# Patient Record
Sex: Female | Born: 1960
Health system: Southern US, Community
[De-identification: ages and names within clinical notes are randomized; demographics above are authoritative.]

## PROBLEM LIST (undated history)

## (undated) DIAGNOSIS — R600 Localized edema: Secondary | ICD-10-CM

## (undated) DIAGNOSIS — N185 Chronic kidney disease, stage 5: Secondary | ICD-10-CM

## (undated) DIAGNOSIS — R0602 Shortness of breath: Secondary | ICD-10-CM

## (undated) DIAGNOSIS — D649 Anemia, unspecified: Secondary | ICD-10-CM

## (undated) DIAGNOSIS — I5032 Chronic diastolic (congestive) heart failure: Secondary | ICD-10-CM

## (undated) DIAGNOSIS — E119 Type 2 diabetes mellitus without complications: Secondary | ICD-10-CM

## (undated) DIAGNOSIS — K5900 Constipation, unspecified: Secondary | ICD-10-CM

## (undated) DIAGNOSIS — J189 Pneumonia, unspecified organism: Secondary | ICD-10-CM

## (undated) DIAGNOSIS — E663 Overweight: Secondary | ICD-10-CM

## (undated) DIAGNOSIS — R0600 Dyspnea, unspecified: Secondary | ICD-10-CM

## (undated) DIAGNOSIS — I10 Essential (primary) hypertension: Secondary | ICD-10-CM

## (undated) HISTORY — DX: Overweight: E66.3

## (undated) HISTORY — PX: TUBAL LIGATION: SHX77

## (undated) HISTORY — DX: Shortness of breath: R06.02

## (undated) HISTORY — PX: TOOTH EXTRACTION: SUR596

## (undated) HISTORY — DX: Type 2 diabetes mellitus without complications: E11.9

## (undated) HISTORY — PX: FISTULA PLUG: SHX5831

## (undated) HISTORY — DX: Essential (primary) hypertension: I10

## (undated) HISTORY — DX: Localized edema: R60.0

## (undated) HISTORY — PX: OTHER SURGICAL HISTORY: SHX169

## (undated) HISTORY — PX: TONSILECTOMY, ADENOIDECTOMY, BILATERAL MYRINGOTOMY AND TUBES: SHX2538

## (undated) HISTORY — PX: FRACTURE SURGERY: SHX138

## (undated) HISTORY — PX: COLONOSCOPY: SHX5424

## (undated) HISTORY — DX: Constipation, unspecified: K59.00

---

## 1998-07-08 ENCOUNTER — Emergency Department (HOSPITAL_COMMUNITY): Admission: EM | Admit: 1998-07-08 | Discharge: 1998-07-08 | Payer: Self-pay | Admitting: Internal Medicine

## 1999-01-08 ENCOUNTER — Emergency Department (HOSPITAL_COMMUNITY): Admission: EM | Admit: 1999-01-08 | Discharge: 1999-01-08 | Payer: Self-pay | Admitting: Emergency Medicine

## 1999-01-08 ENCOUNTER — Encounter: Payer: Self-pay | Admitting: Emergency Medicine

## 1999-01-09 ENCOUNTER — Emergency Department (HOSPITAL_COMMUNITY): Admission: EM | Admit: 1999-01-09 | Discharge: 1999-01-09 | Payer: Self-pay | Admitting: Emergency Medicine

## 1999-01-17 ENCOUNTER — Encounter: Admission: RE | Admit: 1999-01-17 | Discharge: 1999-01-29 | Payer: Self-pay | Admitting: *Deleted

## 2000-02-25 ENCOUNTER — Ambulatory Visit (HOSPITAL_BASED_OUTPATIENT_CLINIC_OR_DEPARTMENT_OTHER): Admission: RE | Admit: 2000-02-25 | Discharge: 2000-02-25 | Payer: Self-pay | Admitting: Orthopedic Surgery

## 2000-12-18 ENCOUNTER — Ambulatory Visit (HOSPITAL_COMMUNITY): Admission: RE | Admit: 2000-12-18 | Discharge: 2000-12-18 | Payer: Self-pay | Admitting: Neurosurgery

## 2000-12-18 ENCOUNTER — Encounter: Payer: Self-pay | Admitting: Neurosurgery

## 2005-01-05 ENCOUNTER — Emergency Department (HOSPITAL_COMMUNITY): Admission: EM | Admit: 2005-01-05 | Discharge: 2005-01-05 | Payer: Self-pay | Admitting: Emergency Medicine

## 2010-05-16 ENCOUNTER — Observation Stay (HOSPITAL_COMMUNITY): Admission: EM | Admit: 2010-05-16 | Discharge: 2010-05-16 | Payer: Self-pay | Admitting: Emergency Medicine

## 2010-05-16 ENCOUNTER — Ambulatory Visit: Payer: Self-pay | Admitting: Cardiovascular Disease

## 2011-03-09 LAB — POCT CARDIAC MARKERS
CKMB, poc: 1.4 ng/mL (ref 1.0–8.0)
CKMB, poc: <1 ng/mL — ABNORMAL LOW (ref 1.0–8.0)
Myoglobin, poc: 56.5 ng/mL (ref 12–200)
Myoglobin, poc: 84.1 ng/mL (ref 12–200)
Troponin i, poc: <0.05 ng/mL (ref 0.00–0.09)
Troponin i, poc: <0.05 ng/mL (ref 0.00–0.09)

## 2011-03-09 LAB — POCT I-STAT, CHEM 8
BUN: 8 mg/dL (ref 6–23)
Calcium, Ion: 1.1 mmol/L — ABNORMAL LOW (ref 1.12–1.32)
Chloride: 106 mEq/L (ref 96–112)
Creatinine, Ser: 0.7 mg/dL (ref 0.4–1.2)
Glucose, Bld: 207 mg/dL — ABNORMAL HIGH (ref 70–99)
HCT: 43 % (ref 36.0–46.0)
Hemoglobin: 14.6 g/dL (ref 12.0–15.0)
Potassium: 3.4 mEq/L — ABNORMAL LOW (ref 3.5–5.1)
Sodium: 139 mEq/L (ref 135–145)
TCO2: 23 mmol/L (ref 0–100)

## 2011-03-09 LAB — CBC
HCT: 41.2 % (ref 36.0–46.0)
Hemoglobin: 13.9 g/dL (ref 12.0–15.0)
MCHC: 33.8 g/dL (ref 30.0–36.0)
MCV: 85.7 fL (ref 78.0–100.0)
Platelets: 260 10*3/uL (ref 150–400)
RBC: 4.81 MIL/uL (ref 3.87–5.11)
RDW: 14 % (ref 11.5–15.5)
WBC: 9.4 10*3/uL (ref 4.0–10.5)

## 2011-03-09 LAB — DIFFERENTIAL
Basophils Absolute: 0 10*3/uL (ref 0.0–0.1)
Basophils Relative: 0 % (ref 0–1)
Eosinophils Absolute: 0.2 10*3/uL (ref 0.0–0.7)
Eosinophils Relative: 2 % (ref 0–5)
Lymphocytes Relative: 36 % (ref 12–46)
Lymphs Abs: 3.4 10*3/uL (ref 0.7–4.0)
Monocytes Absolute: 0.7 10*3/uL (ref 0.1–1.0)
Monocytes Relative: 8 % (ref 3–12)
Neutro Abs: 5.1 10*3/uL (ref 1.7–7.7)
Neutrophils Relative %: 55 % (ref 43–77)

## 2011-05-08 NOTE — Op Note (Signed)
[place]  Patient:    Pamela Campbell, Pamela Campbell                       MRN: [id number] Proc. Date: 02/25/00 Adm. Date:  [id number] Attending:  Lowella Petties                           Operative Report  PREOPERATIVE DIAGNOSIS:  Impingement with type 3 acromion and acromioclavicular  joint arthritis.  POSTOPERATIVE DIAGNOSES: 1. Severe glenoid articular cartilage injury with full thickness flap tears of he    glenoid surface. 2. Acromioclavicular joint arthritis. 3. Impingement and partial thickness rotator cuff tear.  OPERATION: 1. Debridement of glenoid centrally, superiorly and inferiorly with several    areas of exposed bone. 2. Anterolateral acromioplasty with debridement of superior bursa side rotator    cuff tear. 3. Resection of distal clavicle.  SURGEON:  Alta Corning, M.D.  ASSISTANT:  Sharlee Blew, P.A.  ANESTHESIA:  General  BRIEF HISTORY:  This is a 50 year old female with a long history of having had eft shoulder pain after an injury. She had been treated conservatively for almost a  year and had persistent continued pain with motion of the shoulder, especially ith overhead motion.  Because of this, she had MRI.  MRI showed that she had some strange cystic changes on the anterior glenoid rim as well as obvious narrowing of the outlet space and injury in the subacromial space to the rotator cuff.  Also  noted was some impingement via the distal clavicle.  Because of failure of conservative care, she was brought to the operating room for evaluation under anesthesia, arthroscopy and fixation of these abnormalities.  DESCRIPTION OF PROCEDURE:  The patient was brought to the operating room and had adequate anesthesia obtained.  The patient was placed supine on the operating table. She was then moved into the Grant positioner and moved into [employer] chair position.  All bony prominences were well padded.  Care being taken  to make sure there was no pressure around the head and neck area.  Following this, the shoulder was prepped and draped in the usual sterile fashion and following this examination of the glenohumeral joint revealed that there was obvious injury to the articular cartilage and the glenoid.  This was full thickness in a couple of areas.  A probe was put in and large articular flaps were visible, mostly anteriorly, also superiorly and inferiorly on the anterior surface.  This was in combination with a significant amount of anterior labra fray and this was debrided.  This was a significant portion of the operation and took about 25 minutes to debride the anterior labrum and articular cartilage.  The rotator cuff was then evaluated from the undersurface and noted to be normal.  Following this, attention was turned nto the subacromial space where there was significant amount of bursal side injury o the rotator cuff which was debrided.  The Bovie was then used to expose the large anterior subacromial spur. Once this was identified, the coracoacromial ligament was taken down and an anterolateral acromioplasty was performed with the bur. t this point, the distal clavicle was identified and noted to be compressing the rotator cuff on the medial side and distal clavicle resection was undertaken with a large motorized bur.  Following this, the sucker shaver was used to remove all ony fragments on the superior side of the  rotator cuff and debridement was undertaken of this.  The rotator cuff was divided thoroughly from the upper surface and no  full thickness tears were identified.  At this point, the arm was put through a  range of motion and there was no tendency towards impingement anterior laterally or at the medial side of the acromion.  At this point, the shoulder was copiously irrigated and suctioned dry.  The arthroscopic portals were closed with Steri-Strips.  The patient was put  into an arm sling and sterile compression dressing was applied.  She was taken to the recovery room.  She was noted to be in satisfactory condition. Estimated blood loss for the procedure was none. DD:  02/25/00 TD:  02/26/00 Job: [id number]

## 2013-01-17 ENCOUNTER — Emergency Department (HOSPITAL_COMMUNITY)
Admission: EM | Admit: 2013-01-17 | Discharge: 2013-01-17 | Disposition: A | Discharge status: Home / Self Care | Payer: Self-pay | Attending: Emergency Medicine | Admitting: Emergency Medicine

## 2013-01-17 ENCOUNTER — Encounter (HOSPITAL_COMMUNITY): Payer: Self-pay | Admitting: *Deleted

## 2013-01-17 DIAGNOSIS — R509 Fever, unspecified: Secondary | ICD-10-CM | POA: Insufficient documentation

## 2013-01-17 DIAGNOSIS — M545 Low back pain, unspecified: Secondary | ICD-10-CM | POA: Insufficient documentation

## 2013-01-17 DIAGNOSIS — K5900 Constipation, unspecified: Secondary | ICD-10-CM | POA: Insufficient documentation

## 2013-01-17 DIAGNOSIS — R6889 Other general symptoms and signs: Secondary | ICD-10-CM

## 2013-01-17 DIAGNOSIS — R42 Dizziness and giddiness: Secondary | ICD-10-CM | POA: Insufficient documentation

## 2013-01-17 DIAGNOSIS — Z3202 Encounter for pregnancy test, result negative: Secondary | ICD-10-CM | POA: Insufficient documentation

## 2013-01-17 LAB — BASIC METABOLIC PANEL
BUN: 10 mg/dL (ref 6–23)
CO2: 27 mEq/L (ref 19–32)
Chloride: 93 mEq/L — ABNORMAL LOW (ref 96–112)
GFR calc Af Amer: >90 mL/min (ref >90)
Glucose, Bld: 286 mg/dL — ABNORMAL HIGH (ref 70–99)
Potassium: 3.7 mEq/L (ref 3.5–5.1)

## 2013-01-17 LAB — URINALYSIS, ROUTINE W REFLEX MICROSCOPIC
Bilirubin Urine: NEGATIVE
Nitrite: NEGATIVE
Protein, ur: >300 mg/dL — AB
Urobilinogen, UA: 0.2 mg/dL (ref 0.0–1.0)

## 2013-01-17 LAB — CBC WITH DIFFERENTIAL/PLATELET
Basophils Relative: 0 % (ref 0–1)
Hemoglobin: 13.2 g/dL (ref 12.0–15.0)
Lymphocytes Relative: 29 % (ref 12–46)
Lymphs Abs: 2.1 10*3/uL (ref 0.7–4.0)
Monocytes Relative: 9 % (ref 3–12)
Neutro Abs: 4.5 10*3/uL (ref 1.7–7.7)
Neutrophils Relative %: 62 % (ref 43–77)
RBC: 4.6 MIL/uL (ref 3.87–5.11)

## 2013-01-17 LAB — URINE MICROSCOPIC-ADD ON

## 2013-01-17 MED ORDER — KETOROLAC TROMETHAMINE 30 MG/ML IJ SOLN
30.0000 mg | Freq: Once | INTRAMUSCULAR | Status: AC
  Administered 2013-01-17: 30 mg via INTRAVENOUS
  Filled 2013-01-17: qty 1

## 2013-01-17 MED ORDER — OSELTAMIVIR PHOSPHATE 75 MG PO CAPS: 75.0000 mg | ORAL_CAPSULE | Freq: Two times a day (BID) | ORAL | Status: DC

## 2013-01-17 MED ORDER — SODIUM CHLORIDE 0.9 % IV BOLUS (SEPSIS)
1000.0000 mL | Freq: Once | INTRAVENOUS | Status: AC
  Administered 2013-01-17: 1000 mL via INTRAVENOUS

## 2013-01-17 MED ORDER — POLYETHYLENE GLYCOL 3350 17 G PO PACK: 17.0000 g | PACK | Freq: Every day | ORAL | Status: DC

## 2013-01-17 MED ORDER — ONDANSETRON HCL 4 MG/2ML IJ SOLN: 4.0000 mg | Freq: Once | INTRAMUSCULAR | Status: DC

## 2013-01-17 MED ORDER — ACETAMINOPHEN 325 MG PO TABS
650.0000 mg | ORAL_TABLET | Freq: Once | ORAL | Status: AC
  Administered 2013-01-17: 650 mg via ORAL
  Filled 2013-01-17: qty 2

## 2013-01-17 MED ORDER — MORPHINE SULFATE 4 MG/ML IJ SOLN: 4.0000 mg | Freq: Once | INTRAMUSCULAR | Status: DC

## 2013-01-17 NOTE — ED Provider Notes (Signed)
History     CSN: [id number]  Arrival date & time 01/17/13  [id number]   First MD Initiated Contact with Patient 01/17/13 2038      Chief Complaint  Patient presents with  . Abdominal Pain    (Consider location/radiation/quality/duration/timing/severity/associated sxs/prior treatment) HPI Comments: Pamela Campbell is a 52 y.o. female w no sig PMHx that present to the ER c/o constipation. Pt reports she did not have a BM for 7 days and she began developing mild sharp diffuse abdominal pains, However she was able to have a normal BM today and has had no more pain since. In addition pts states that she feels like she is "coming down with something." She describes feeling more fatigued then usual, having a tactile fever, a light itch in her throat and periods of light headedness. Pt denies any N/V/D, syncope, CP, SOB, palpitations, sweating, nausea, neck pain, change in appetite ataxia, dysequilibrium, change in vision or  Head trauma. Denies melena hematochezia.   Patient is a 52 y.o. female presenting with abdominal pain. The history is provided by the patient.  Abdominal Pain The primary symptoms of the illness include abdominal pain. The primary symptoms of the illness do not include fever, nausea, vomiting, diarrhea or dysuria.  Additional symptoms associated with the illness include constipation. Symptoms associated with the illness do not include diaphoresis.    History reviewed. No pertinent past medical history.  History reviewed. No pertinent past surgical history.  No family history on file.  History  Substance Use Topics  . Smoking status: Never Smoker   . Smokeless tobacco: Not on file  . Alcohol Use: No    OB History    Grav Para Term Preterm Abortions TAB SAB Ect Mult Living                  Review of Systems  Constitutional: Negative for fever, diaphoresis and activity change.  HENT: Negative for congestion and neck pain.   Respiratory: Negative for cough.     Gastrointestinal: Positive for abdominal pain and constipation. Negative for nausea, vomiting, diarrhea and blood in stool.  Genitourinary: Negative for dysuria.  Musculoskeletal: Negative for myalgias.  Skin: Negative for color change and wound.  Neurological: Positive for dizziness. Negative for headaches.  All other systems reviewed and are negative.    Allergies  Compazine and Vioxx  Home Medications   Current Outpatient Rx  Name  Route  Sig  Dispense  Refill  . DEXTROMETHORPHAN HBR 15 MG/5ML PO SYRP   Oral   Take 10 mLs by mouth 4 (four) times daily as needed. For cough           BP 197/97  Pulse 87  Temp 99.7 F (37.6 C) (Oral)  Resp 20  SpO2 95%  Physical Exam  Constitutional: She is oriented to person, place, and time. She appears well-developed and well-nourished. No distress.  HENT:  Head: Normocephalic and atraumatic.       MMM, orophyarnx clear and moist without exudate  Eyes: Conjunctivae normal and EOM are normal. Pupils are equal, round, and reactive to light. No scleral icterus.  Neck: Normal range of motion. Neck supple. No tracheal deviation present. No thyromegaly present.  Cardiovascular: Normal rate, regular rhythm, normal heart sounds and intact distal pulses.   Pulmonary/Chest: No stridor.       LCAB, normal effort  Abdominal: Soft.       Obese soft non tender  Musculoskeletal: Normal range of motion. She exhibits no edema  and no tenderness.  Neurological: She is alert and oriented to person, place, and time. Coordination normal.       CN intact, normal gait, good coordination  Skin: Skin is warm and dry. No rash noted. She is not diaphoretic. No erythema. No pallor.  Psychiatric: She has a normal mood and affect. Her behavior is normal.    ED Course  Procedures (including critical care time)  Labs Reviewed  URINALYSIS, ROUTINE W REFLEX MICROSCOPIC - Abnormal; Notable for the following:    Glucose, UA 100 (*)     Hgb urine dipstick  TRACE (*)     Protein, ur >300 (*)     All other components within normal limits  URINE MICROSCOPIC-ADD ON - Abnormal; Notable for the following:    Squamous Epithelial / LPF FEW (*)     Bacteria, UA FEW (*)     Casts GRANULAR CAST (*)     All other components within normal limits  POCT PREGNANCY, URINE  CBC WITH DIFFERENTIAL  BASIC METABOLIC PANEL   No results found.   1. Constipation   2. Flu-like symptoms       MDM  52 yo F w PMHx presents to ER reporting she had intermittent abdominal pain associated with constipation x 7 day that resolved after a BM that was had this morning. In addition she reports the new onset of flu like symptoms of fatigue, myalgias and low grade fever. BP 197/97  Pulse 87  Temp 99.7 F (37.6 C) (Oral)  Resp 20  SpO2 95% Pt will be dc w tamiflu and mirilax w instructions to hydrate rest and follow up w PCP. Strict return precautions discussed especially if abdominal pain returns with fever. Pt verbalizes understanding and appears reliable.         Verl Dicker, [place] 01/25/13 0020

## 2013-01-17 NOTE — ED Notes (Signed)
Pt discharged.Vital signs stable and GCS 15 

## 2013-01-17 NOTE — ED Notes (Signed)
The pt has had lower back and lower abd pain for 4-5 days.  She has also had dizziness and no bm for approx 7 days.  She has had laxatives with no results and she has periods of feeling like she is going to faint.

## 2013-01-25 NOTE — ED Provider Notes (Signed)
Medical screening examination/treatment/procedure(s) were performed by non-physician practitioner and as supervising physician I was immediately available for consultation/collaboration.   Ezequiel Essex, MD 01/25/13 1053

## 2013-05-08 ENCOUNTER — Ambulatory Visit (INDEPENDENT_AMBULATORY_CARE_PROVIDER_SITE_OTHER): Payer: BC Managed Care – PPO | Admitting: Physician Assistant

## 2013-05-08 VITALS — BP 182/118 | HR 72 | Temp 98.1°F | Resp 16 | Ht 62.0 in | Wt 241.0 lb

## 2013-05-08 DIAGNOSIS — R7309 Other abnormal glucose: Secondary | ICD-10-CM

## 2013-05-08 DIAGNOSIS — M25551 Pain in right hip: Secondary | ICD-10-CM

## 2013-05-08 DIAGNOSIS — I10 Essential (primary) hypertension: Secondary | ICD-10-CM

## 2013-05-08 DIAGNOSIS — R739 Hyperglycemia, unspecified: Secondary | ICD-10-CM

## 2013-05-08 DIAGNOSIS — M25559 Pain in unspecified hip: Secondary | ICD-10-CM

## 2013-05-08 LAB — POCT GLYCOSYLATED HEMOGLOBIN (HGB A1C): Hemoglobin A1C: 9.7

## 2013-05-08 MED ORDER — LISINOPRIL-HYDROCHLOROTHIAZIDE 10-12.5 MG PO TABS: 1.0000 | ORAL_TABLET | Freq: Every day | ORAL | Status: DC

## 2013-05-08 MED ORDER — TRAMADOL HCL 50 MG PO TABS: 50.0000 mg | ORAL_TABLET | Freq: Three times a day (TID) | ORAL | Status: DC | PRN

## 2013-05-08 NOTE — Progress Notes (Signed)
[address] [place] [address]   Phone [phone]  Subjective:    Patient ID: Pamela Campbell, female    DOB: 05/23/1961, 52 y.o.   MRN: [id number]  HPI  Pt presents to clinic with R hip pain that started 2 days ago and getting worse.  She had no injury that she knows of.  She had trouble sleeping last pm because of the pain.  The pain is just local - there is no pain radiation.  She has never had a similar pain in the past.  She has been monitoring her BP at home and it is running high 140s/high 90s.  She has been to the ED before when she has felt bad and been told she needs to be on medications but she does not want to take a medication every day.  She is in the process of losing weight with lifestyle changes and exercise.  She has not changed her exercise regimen.  She stands for 10h per day at work and she does notice some swelling at the end of the day.  Pt is not having back pain.   Review of Systems  Eyes: Negative for visual disturbance.  Respiratory: Negative for shortness of breath.   Cardiovascular: Negative for chest pain.  Gastrointestinal: Negative for nausea.  Musculoskeletal: Positive for joint swelling and arthralgias (R hip). Negative for back pain.       Objective:   Physical Exam  Vitals reviewed. Constitutional: She is oriented to person, place, and time. She appears well-developed and well-nourished.  HENT:  Head: Normocephalic and atraumatic.  Right Ear: External ear normal.  Left Ear: External ear normal.  Nose: Nose normal.  Eyes: Conjunctivae are normal.  Neck: Neck supple.  Cardiovascular: Normal rate, regular rhythm and normal heart sounds.   No murmur heard. Pulmonary/Chest: Effort normal and breath sounds normal.  Musculoskeletal:       Right hip: She exhibits decreased range of motion, decreased strength and tenderness.       Legs: Pain with hip rotation.  Pain with hip abduction and strength testing but not adduction.  No pain with hamstring  testing, pain with quad strength testing.  No TTP of Si joint.  No TTP of lumbar spine.  This is a difficult exam due to patient's body habitus, bony landmarks are difficult to find.  Neurological: She is alert and oriented to person, place, and time.  Skin: Skin is warm and dry.  Psychiatric: She has a normal mood and affect. Her behavior is normal. Judgment and thought content normal.    Results for orders placed in visit on 05/08/13  POCT GLYCOSYLATED HEMOGLOBIN (HGB A1C)      Result Value Range   Hemoglobin A1C 9.7         Assessment & Plan:  HTN (hypertension) - D/w pt at length concerns with high BP and no treatment.  If pt is able to continue her weight loss and her BP improves we can always take her off the medications but if she continues to wait she will increase the risk of LVH and strain on her heart.  I also d/w pt that her glucose had been high in the past and I was concerned she had DM.  I will check the labs and we will treat at her f/u for BP in 2 wks.  I want to take it slow with patient because of her apprehension with medications. Plan: Comprehensive metabolic panel, POCT glycosylated hemoglobin (Hb A1C), Lipid  panel, lisinopril-hydrochlorothiazide (PRINZIDE,ZESTORETIC) 10-12.5 MG per tablet.  Pt will keep close eye on her BP at home and she will bring in her BP cuff at her f/u.  If she develops CP, SOB L arm pain to ED.  She had a stress echo about 1 year ago that was normal.  We will get an EKG at her f/u.  Hip pain, acute, right - I am concerned ot has trochanteric bursitis, the cause I am not sure but due to her body habitus and difficult landmarks we will try ice and pain medications due internal bleeding with NSAIDs in the past. If she is not better in 2 wks we will do an ortho referral. - Plan: traMADol (ULTRAM) 50 MG tablet  Windell Hummingbird PA-C 05/08/2013 6:06 PM

## 2013-05-08 NOTE — Patient Instructions (Signed)

## 2013-05-09 ENCOUNTER — Telehealth: Payer: Self-pay

## 2013-05-09 DIAGNOSIS — M25551 Pain in right hip: Secondary | ICD-10-CM

## 2013-05-09 LAB — COMPREHENSIVE METABOLIC PANEL
ALT: 30 U/L (ref 0–35)
CO2: 27 mEq/L (ref 19–32)
Calcium: 9.3 mg/dL (ref 8.4–10.5)
Chloride: 103 mEq/L (ref 96–112)
Creat: 1.06 mg/dL (ref 0.50–1.10)

## 2013-05-09 LAB — LIPID PANEL: HDL: 53 mg/dL (ref >39)

## 2013-05-09 MED ORDER — HYDROCODONE-ACETAMINOPHEN 5-325 MG PO TABS: 1.0000 | ORAL_TABLET | Freq: Four times a day (QID) | ORAL | Status: DC | PRN

## 2013-05-09 NOTE — Telephone Encounter (Signed)
I have written some Norco - hopefully that will help.

## 2013-05-09 NOTE — Telephone Encounter (Signed)
Patient states tramadol not helping with her trochanteric bursitis. Please advise. She was advised of labs, and plans to see you in 2 weeks.

## 2013-05-09 NOTE — Telephone Encounter (Signed)
Pamela Campbell,    Patient wants a stronger medication, what you prescribed yesterday is not working.   [address]   [phone]

## 2013-05-09 NOTE — Telephone Encounter (Signed)
Patient advised.

## 2014-12-21 DIAGNOSIS — J189 Pneumonia, unspecified organism: Secondary | ICD-10-CM

## 2014-12-21 HISTORY — DX: Pneumonia, unspecified organism: J18.9

## 2015-03-23 ENCOUNTER — Encounter (HOSPITAL_COMMUNITY): Payer: Self-pay | Admitting: Emergency Medicine

## 2015-03-23 ENCOUNTER — Encounter (HOSPITAL_COMMUNITY): Payer: Self-pay | Admitting: *Deleted

## 2015-03-23 ENCOUNTER — Emergency Department (INDEPENDENT_AMBULATORY_CARE_PROVIDER_SITE_OTHER)
Admission: EM | Admit: 2015-03-23 | Discharge: 2015-03-23 | Disposition: A | Discharge status: Nursing Home (Includes ICF) | Payer: Self-pay | Source: Home / Self Care | Attending: Family Medicine | Admitting: Family Medicine

## 2015-03-23 ENCOUNTER — Emergency Department (HOSPITAL_COMMUNITY): Payer: Self-pay

## 2015-03-23 ENCOUNTER — Inpatient Hospital Stay (HOSPITAL_COMMUNITY)
Admission: EM | Admit: 2015-03-23 | Discharge: 2015-03-28 | DRG: 291 | Disposition: A | Discharge status: Home / Self Care | Payer: Self-pay | Attending: Internal Medicine | Admitting: Internal Medicine

## 2015-03-23 DIAGNOSIS — Z8249 Family history of ischemic heart disease and other diseases of the circulatory system: Secondary | ICD-10-CM

## 2015-03-23 DIAGNOSIS — E669 Obesity, unspecified: Secondary | ICD-10-CM | POA: Diagnosis present

## 2015-03-23 DIAGNOSIS — E785 Hyperlipidemia, unspecified: Secondary | ICD-10-CM | POA: Diagnosis present

## 2015-03-23 DIAGNOSIS — J209 Acute bronchitis, unspecified: Secondary | ICD-10-CM | POA: Diagnosis present

## 2015-03-23 DIAGNOSIS — I5033 Acute on chronic diastolic (congestive) heart failure: Principal | ICD-10-CM | POA: Diagnosis present

## 2015-03-23 DIAGNOSIS — R9389 Abnormal findings on diagnostic imaging of other specified body structures: Secondary | ICD-10-CM | POA: Diagnosis not present

## 2015-03-23 DIAGNOSIS — R0602 Shortness of breath: Secondary | ICD-10-CM

## 2015-03-23 DIAGNOSIS — I119 Hypertensive heart disease without heart failure: Secondary | ICD-10-CM | POA: Diagnosis present

## 2015-03-23 DIAGNOSIS — Z23 Encounter for immunization: Secondary | ICD-10-CM

## 2015-03-23 DIAGNOSIS — R778 Other specified abnormalities of plasma proteins: Secondary | ICD-10-CM | POA: Diagnosis present

## 2015-03-23 DIAGNOSIS — I2510 Atherosclerotic heart disease of native coronary artery without angina pectoris: Secondary | ICD-10-CM | POA: Diagnosis present

## 2015-03-23 DIAGNOSIS — R0609 Other forms of dyspnea: Secondary | ICD-10-CM

## 2015-03-23 DIAGNOSIS — I214 Non-ST elevation (NSTEMI) myocardial infarction: Secondary | ICD-10-CM

## 2015-03-23 DIAGNOSIS — R7989 Other specified abnormal findings of blood chemistry: Secondary | ICD-10-CM

## 2015-03-23 DIAGNOSIS — I5030 Unspecified diastolic (congestive) heart failure: Secondary | ICD-10-CM | POA: Diagnosis present

## 2015-03-23 DIAGNOSIS — J189 Pneumonia, unspecified organism: Secondary | ICD-10-CM | POA: Clinically undetermined

## 2015-03-23 DIAGNOSIS — N179 Acute kidney failure, unspecified: Secondary | ICD-10-CM | POA: Diagnosis present

## 2015-03-23 DIAGNOSIS — N289 Disorder of kidney and ureter, unspecified: Secondary | ICD-10-CM

## 2015-03-23 DIAGNOSIS — E119 Type 2 diabetes mellitus without complications: Secondary | ICD-10-CM | POA: Diagnosis present

## 2015-03-23 DIAGNOSIS — I248 Other forms of acute ischemic heart disease: Secondary | ICD-10-CM | POA: Diagnosis present

## 2015-03-23 DIAGNOSIS — I252 Old myocardial infarction: Secondary | ICD-10-CM

## 2015-03-23 DIAGNOSIS — Z87891 Personal history of nicotine dependence: Secondary | ICD-10-CM

## 2015-03-23 DIAGNOSIS — G4733 Obstructive sleep apnea (adult) (pediatric): Secondary | ICD-10-CM | POA: Diagnosis present

## 2015-03-23 DIAGNOSIS — I5032 Chronic diastolic (congestive) heart failure: Secondary | ICD-10-CM | POA: Diagnosis present

## 2015-03-23 DIAGNOSIS — Z9109 Other allergy status, other than to drugs and biological substances: Secondary | ICD-10-CM

## 2015-03-23 DIAGNOSIS — Z91120 Patient's intentional underdosing of medication regimen due to financial hardship: Secondary | ICD-10-CM | POA: Diagnosis present

## 2015-03-23 DIAGNOSIS — Z9114 Patient's other noncompliance with medication regimen: Secondary | ICD-10-CM | POA: Diagnosis present

## 2015-03-23 DIAGNOSIS — G4730 Sleep apnea, unspecified: Secondary | ICD-10-CM | POA: Diagnosis present

## 2015-03-23 DIAGNOSIS — I10 Essential (primary) hypertension: Secondary | ICD-10-CM | POA: Diagnosis present

## 2015-03-23 DIAGNOSIS — K5900 Constipation, unspecified: Secondary | ICD-10-CM | POA: Diagnosis present

## 2015-03-23 DIAGNOSIS — J690 Pneumonitis due to inhalation of food and vomit: Secondary | ICD-10-CM | POA: Diagnosis present

## 2015-03-23 DIAGNOSIS — Z6841 Body Mass Index (BMI) 40.0 and over, adult: Secondary | ICD-10-CM

## 2015-03-23 LAB — CBC WITH DIFFERENTIAL/PLATELET
BASOS ABS: 0 10*3/uL (ref 0.0–0.1)
BASOS PCT: 0 % (ref 0–1)
EOS PCT: 0 % (ref 0–5)
Eosinophils Absolute: 0.1 10*3/uL (ref 0.0–0.7)
HEMATOCRIT: 40.1 % (ref 36.0–46.0)
Hemoglobin: 13.2 g/dL (ref 12.0–15.0)
Lymphocytes Relative: 14 % (ref 12–46)
Lymphs Abs: 2.3 10*3/uL (ref 0.7–4.0)
MCH: 27.6 pg (ref 26.0–34.0)
MCHC: 32.9 g/dL (ref 30.0–36.0)
MCV: 83.7 fL (ref 78.0–100.0)
Monocytes Absolute: 1 10*3/uL (ref 0.1–1.0)
Monocytes Relative: 7 % (ref 3–12)
NEUTROS PCT: 79 % — AB (ref 43–77)
Neutro Abs: 12.4 10*3/uL — ABNORMAL HIGH (ref 1.7–7.7)
Platelets: 322 10*3/uL (ref 150–400)
RBC: 4.79 MIL/uL (ref 3.87–5.11)
RDW: 14.1 % (ref 11.5–15.5)
WBC: 15.8 10*3/uL — ABNORMAL HIGH (ref 4.0–10.5)

## 2015-03-23 LAB — BASIC METABOLIC PANEL
Anion gap: 7 (ref 5–15)
BUN: 13 mg/dL (ref 6–23)
CHLORIDE: 100 mmol/L (ref 96–112)
CO2: 29 mmol/L (ref 19–32)
Calcium: 9.4 mg/dL (ref 8.4–10.5)
Creatinine, Ser: 1.2 mg/dL — ABNORMAL HIGH (ref 0.50–1.10)
GFR, EST AFRICAN AMERICAN: 59 mL/min — AB (ref >90)
GFR, EST NON AFRICAN AMERICAN: 51 mL/min — AB (ref >90)
Glucose, Bld: 288 mg/dL — ABNORMAL HIGH (ref 70–99)
POTASSIUM: 3.9 mmol/L (ref 3.5–5.1)
Sodium: 136 mmol/L (ref 135–145)

## 2015-03-23 LAB — BRAIN NATRIURETIC PEPTIDE: B Natriuretic Peptide: 170.6 pg/mL — ABNORMAL HIGH (ref 0.0–100.0)

## 2015-03-23 LAB — TROPONIN I: TROPONIN I: 0.08 ng/mL — AB (ref <0.031)

## 2015-03-23 MED ORDER — POLYETHYLENE GLYCOL 3350 17 G PO PACK
17.0000 g | PACK | Freq: Every day | ORAL | Status: DC
  Administered 2015-03-24 – 2015-03-26 (×2): 17 g via ORAL
  Filled 2015-03-23 (×5): qty 1

## 2015-03-23 MED ORDER — ACETAMINOPHEN 325 MG PO TABS
650.0000 mg | ORAL_TABLET | ORAL | Status: DC | PRN
  Administered 2015-03-24 – 2015-03-27 (×6): 650 mg via ORAL
  Filled 2015-03-23 (×6): qty 2

## 2015-03-23 MED ORDER — ONDANSETRON HCL 4 MG/2ML IJ SOLN: 4.0000 mg | Freq: Four times a day (QID) | INTRAMUSCULAR | Status: DC | PRN

## 2015-03-23 MED ORDER — LABETALOL HCL 5 MG/ML IV SOLN
10.0000 mg | Freq: Once | INTRAVENOUS | Status: AC
  Administered 2015-03-23: 10 mg via INTRAVENOUS
  Filled 2015-03-23: qty 4

## 2015-03-23 MED ORDER — NITROGLYCERIN 0.4 MG SL SUBL: 0.4000 mg | SUBLINGUAL_TABLET | SUBLINGUAL | Status: DC | PRN

## 2015-03-23 MED ORDER — ATORVASTATIN CALCIUM 80 MG PO TABS
80.0000 mg | ORAL_TABLET | Freq: Every day | ORAL | Status: DC
  Administered 2015-03-24 – 2015-03-27 (×4): 80 mg via ORAL
  Filled 2015-03-23 (×5): qty 1

## 2015-03-23 MED ORDER — HEPARIN (PORCINE) IN NACL 100-0.45 UNIT/ML-% IJ SOLN
1400.0000 [IU]/h | INTRAMUSCULAR | Status: DC
  Administered 2015-03-24: 1100 [IU]/h via INTRAVENOUS
  Filled 2015-03-23 (×2): qty 250

## 2015-03-23 MED ORDER — ASPIRIN EC 81 MG PO TBEC
81.0000 mg | DELAYED_RELEASE_TABLET | Freq: Every day | ORAL | Status: DC
  Administered 2015-03-24 – 2015-03-28 (×5): 81 mg via ORAL
  Filled 2015-03-23 (×5): qty 1

## 2015-03-23 MED ORDER — HEPARIN BOLUS VIA INFUSION
4000.0000 [IU] | Freq: Once | INTRAVENOUS | Status: AC
  Administered 2015-03-24: 4000 [IU] via INTRAVENOUS
  Filled 2015-03-23: qty 4000

## 2015-03-23 MED ORDER — CARVEDILOL 12.5 MG PO TABS
12.5000 mg | ORAL_TABLET | Freq: Two times a day (BID) | ORAL | Status: DC
  Administered 2015-03-24 – 2015-03-25 (×4): 12.5 mg via ORAL
  Filled 2015-03-23 (×7): qty 1

## 2015-03-23 MED ORDER — NITROGLYCERIN IN D5W 200-5 MCG/ML-% IV SOLN
3.0000 ug/min | INTRAVENOUS | Status: DC
  Administered 2015-03-23: 5 ug/min via INTRAVENOUS
  Filled 2015-03-23: qty 250

## 2015-03-23 MED ORDER — FUROSEMIDE 10 MG/ML IJ SOLN
40.0000 mg | Freq: Once | INTRAMUSCULAR | Status: AC
  Administered 2015-03-23: 40 mg via INTRAVENOUS
  Filled 2015-03-23: qty 4

## 2015-03-23 NOTE — ED Provider Notes (Signed)
CSN: [id number]     Arrival date & time 03/23/15  1853 History   First MD Initiated Contact with Patient 03/23/15 2054     Chief Complaint  Patient presents with  . Shortness of Breath     (Consider location/radiation/quality/duration/timing/severity/associated sxs/prior Treatment) HPI Comments: 54 year old female with high blood pressure, patient has not been on medications for 2 years, obesity, nonsmoker presents with exertional dyspnea gradually worsening throughout today. Patient has no known cardiac history, no classic blood clot risk factors, patient short of breath after walking a few steps. Patient has mild swelling in both legs, no calf tenderness. Patient is noncompliant with medications.  No cp.  IMproves with rest.   Patient is a 54 y.o. female presenting with shortness of breath. The history is provided by the patient.  Shortness of Breath Associated symptoms: no abdominal pain, no chest pain, no fever, no headaches, no neck pain, no rash and no vomiting     Past Medical History  Diagnosis Date  . Hypertension    Past Surgical History  Procedure Laterality Date  . Fracture surgery    . Tubal ligation    . Cesarean section     Family History  Problem Relation Age of Onset  . Heart disease Father    History  Substance Use Topics  . Smoking status: Never Smoker   . Smokeless tobacco: Not on file  . Alcohol Use: No   OB History    No data available     Review of Systems  Constitutional: Negative for fever and chills.  HENT: Negative for congestion.   Eyes: Negative for visual disturbance.  Respiratory: Positive for shortness of breath.   Cardiovascular: Negative for chest pain.  Gastrointestinal: Negative for vomiting and abdominal pain.  Genitourinary: Negative for dysuria and flank pain.  Musculoskeletal: Negative for back pain, neck pain and neck stiffness.  Skin: Negative for rash.  Neurological: Negative for light-headedness and headaches.       Allergies  Vioxx and Compazine  Home Medications   Prior to Admission medications   Medication Sig Start Date End Date Taking? Authorizing Provider  b complex vitamins tablet Take 1 tablet by mouth daily.   Yes Historical Provider, MD  Digestive Enzymes (ENZYME DIGEST) CAPS Take 1 capsule by mouth daily.   Yes Historical Provider, MD  Multiple Vitamin (MULTIVITAMIN WITH MINERALS) TABS tablet Take 1 tablet by mouth daily.   Yes Historical Provider, MD  naproxen sodium (ANAPROX) 220 MG tablet Take 220 mg by mouth 2 (two) times daily as needed (for knee pain).   Yes Historical Provider, MD  Probiotic Product (PROBIOTIC DAILY) CAPS Take 1 capsule by mouth daily.   Yes Historical Provider, MD  HYDROcodone-acetaminophen (NORCO/VICODIN) 5-325 MG per tablet Take 1 tablet by mouth every 6 (six) hours as needed for pain. 05/09/13   Mancel Bale, PA-C  lisinopril-hydrochlorothiazide (PRINZIDE,ZESTORETIC) 10-12.5 MG per tablet Take 1 tablet by mouth daily. Patient not taking: Reported on 03/23/2015 05/08/13   Mancel Bale, PA-C  traMADol (ULTRAM) 50 MG tablet Take 1 tablet (50 mg total) by mouth every 8 (eight) hours as needed for pain. 05/08/13   Mancel Bale, PA-C   BP 213/112 mmHg  Pulse 77  Temp(Src) 99.4 F (37.4 C) (Oral)  Resp 18  SpO2 95% Physical Exam  Constitutional: She is oriented to person, place, and time. She appears well-developed and well-nourished.  HENT:  Head: Normocephalic and atraumatic.  Eyes: Conjunctivae are normal. Right eye exhibits no  discharge. Left eye exhibits no discharge.  Neck: Normal range of motion. Neck supple. No tracheal deviation present.  Cardiovascular: Normal rate, regular rhythm and intact distal pulses.   No murmur heard. Pulmonary/Chest: Effort normal and breath sounds normal.  Abdominal: Soft. She exhibits no distension. There is no tenderness. There is no guarding.  Musculoskeletal: She exhibits no edema.  Neurological: She is alert and  oriented to person, place, and time.  Skin: Skin is warm. No rash noted.  Psychiatric: She has a normal mood and affect.  Nursing note and vitals reviewed.   ED Course  Procedures (including critical care time) Labs Review Labs Reviewed  BASIC METABOLIC PANEL - Abnormal; Notable for the following:    Glucose, Bld 288 (*)    Creatinine, Ser 1.20 (*)    GFR calc non Af Amer 51 (*)    GFR calc Af Amer 59 (*)    All other components within normal limits  BRAIN NATRIURETIC PEPTIDE - Abnormal; Notable for the following:    B Natriuretic Peptide 170.6 (*)    All other components within normal limits  CBC WITH DIFFERENTIAL/PLATELET - Abnormal; Notable for the following:    WBC 15.8 (*)    Neutrophils Relative % 79 (*)    Neutro Abs 12.4 (*)    All other components within normal limits  TROPONIN I - Abnormal; Notable for the following:    Troponin I 0.08 (*)    All other components within normal limits  TROPONIN I  TROPONIN I  HEMOGLOBIN [id number]  BASIC METABOLIC PANEL  LIPID PANEL  CBC  URINALYSIS, ROUTINE W REFLEX MICROSCOPIC    Imaging Review Dg Chest 2 View  03/23/2015   CLINICAL DATA:  Shortness of breath for several weeks with exertion. Initial encounter  EXAM: CHEST  2 VIEW  COMPARISON:  05/15/2010  FINDINGS: The aorta is unfolded and ectatic. Curvilinear left lower lobe airspace opacities are identified. Right lung is clear. Heart size upper limits of normal. No pleural effusion.  IMPRESSION: Thin curvilinear left lower lobe airspace opacities most likely indicating atelectasis or possibly scarring developing in the interval since the prior exam. Early pneumonia could appear similar but is less likely.   Electronically Signed   By: Conchita Paris M.D.   On: 03/23/2015 21:31     EKG Interpretation   Date/Time:  Saturday March 23 2015 19:28:03 EDT Ventricular Rate:  92 PR Interval:  154 QRS Duration: 78 QT Interval:  372 QTC Calculation: 460 R Axis:   20 Text  Interpretation:  Normal sinus rhythm Anteroseptal infarct , age  undetermined Abnormal ECG similar to previous Confirmed by Osmara Drummonds  MD,  Gaelyn Tukes ([id number]) on 03/23/2015 9:08:47 PM      MDM   Final diagnoses:  Exertional dyspnea  Essential hypertension  Troponin level elevated  Acute renal failure, unspecified acute renal failure type   Patient presents with exertional dyspnea, noncompliance with blood pressure medicines. Patient has significant all they blood pressure in the ER. Plan for IV blood pressure meds, troponin elevated, cardiology consult for admission. Patient denies blood clot history, active cancer, recent major trauma or surgery, unilateral leg swelling/ pain, recent long travel, hemoptysis or oral contraceptives.   The patients results and plan were reviewed and discussed.   Any x-rays performed were personally reviewed by myself.   Differential diagnosis were considered with the presenting HPI.  Medications  aspirin EC tablet 81 mg (not administered)  nitroGLYCERIN (NITROSTAT) SL tablet 0.4 mg (not administered)  acetaminophen (  TYLENOL) tablet 650 mg (not administered)  ondansetron (ZOFRAN) injection 4 mg (not administered)  nitroGLYCERIN 50 mg in dextrose 5 % 250 mL (0.2 mg/mL) infusion (3 mcg/min Intravenous New Bag/Given 03/23/15 2331)  carvedilol (COREG) tablet 12.5 mg (not administered)  atorvastatin (LIPITOR) tablet 80 mg (not administered)  polyethylene glycol (MIRALAX / GLYCOLAX) packet 17 g (not administered)  labetalol (NORMODYNE,TRANDATE) injection 10 mg (10 mg Intravenous Given 03/23/15 2159)  furosemide (LASIX) injection 40 mg (40 mg Intravenous Given 03/23/15 2331)    Filed Vitals:   03/23/15 2145 03/23/15 2215 03/23/15 2245 03/23/15 2300  BP: 196/100 185/105 205/116 213/112  Pulse: 78 70 77 77  Temp:      TempSrc:      Resp: 21 19 17 18   SpO2: 97% 93% 96% 95%    Final diagnoses:  Exertional dyspnea  Essential hypertension  Troponin level elevated   Acute renal failure, unspecified acute renal failure type    Admission/ observation were discussed with the admitting physician, patient and/or family and they are comfortable with the plan.        Elnora Morrison, MD 03/23/15 [phone]

## 2015-03-23 NOTE — ED Notes (Signed)
Patient transported to X-ray 

## 2015-03-23 NOTE — H&P (Signed)
HPI: [name] is a 87F with untreated HTN who presents from urgent care with hypertensive emergency and NSTEMI.  [name] reports 2 weeks of SOB that was markedly worse today.  She was ambulating from her car to a public bathroom and was unable to get there without stopping.  People passing by stopped to ask if she was OK because her breathing was in extremis.  She denies CP or palpitations, but she did become diaphoretic and felt like she was suffocating.  She was afraid that she may die.   She has gained 20lb in 3 weeks, notes abdominal distension and LE edema, though the edema improves each evening after she goes to sleep.  She denies orthopnea or PND, though her daughter notes that she snores and frequently stops breathing overnight.  She has not seen a doctor in years due to lack of insurance.  She has known of her HTN diagnosis for years but has been unable to afford medications.  She recently started a new job but still does not have insurance.  [name] initially presented to urgent care but was referred to the ED due to her SBP over 200 and respiratory distress. In the ED [name] initial BP was 236/117 and troponin was positive.  EKG revealed a prior infarct but no acute findings.   [name] son has HF with an ICD in place.   Review of Systems: As per HPI   Past Medical History  Diagnosis Date  . Hypertension      (Not in a hospital admission)   Allergies  Allergen Reactions  . Vioxx [Rofecoxib] Other (See Comments)    Bleeding  . Compazine [Prochlorperazine Edisylate] Swelling    History   Social History  . Marital Status: Married    Spouse Name: N/A  . Number of Children: N/A  . Years of Education: N/A   Occupational History  . Not on file.   Social History Main Topics  . Smoking status: Never Smoker   . Smokeless tobacco: Not on file  . Alcohol Use: No  . Drug Use: No  . Sexual Activity: No   Other Topics Concern  . Not on file   Social History  Narrative    Family History  Problem Relation Age of Onset  . Heart disease Father     PHYSICAL EXAM: Filed Vitals:   03/23/15 2300  BP: 213/112  Pulse: 77  Temp:   Resp: 18   General:  Well appearing. Mild respiratory difficulty with talking. HEENT: normal Neck: supple. JVD to earlobe.  +HJR.  Cor: PMI nondisplaced. Regular rate & rhythm. No rubs, gallops or murmurs. Lungs: clear bilaterally Abdomen: soft, nontender.  Distended. No hepatosplenomegaly. No bruits or masses. Hypoative bowel sounds. Extremities: no cyanosis, clubbing, rash.  2+ pitting edema to the knees bilaterally Neuro: alert & oriented x 3, cranial nerves grossly intact. moves all 4 extremities w/o difficulty. Affect pleasant.  ECG: Sinus 97bpm.  Old anteroseptal infarct.  Results for orders placed or performed during the hospital encounter of 03/23/15 (from the past 24 hour(s))  Basic metabolic panel     Status: Abnormal   Collection Time: 03/23/15  7:45 PM  Result Value Ref Range   Sodium 136 135 - 145 mmol/L   Potassium 3.9 3.5 - 5.1 mmol/L   Chloride 100 96 - 112 mmol/L   CO2 29 19 - 32 mmol/L   Glucose, Bld 288 (H) 70 - 99 mg/dL   BUN 13  6 - 23 mg/dL   Creatinine, Ser 1.20 (H) 0.50 - 1.10 mg/dL   Calcium 9.4 8.4 - 10.5 mg/dL   GFR calc non Af Amer 51 (L) >90 mL/min   GFR calc Af Amer 59 (L) >90 mL/min   Anion gap 7 5 - 15  BNP (order ONLY if patient complains of dyspnea/SOB AND you have documented it for THIS visit)     Status: Abnormal   Collection Time: 03/23/15  7:45 PM  Result Value Ref Range   B Natriuretic Peptide 170.6 (H) 0.0 - 100.0 pg/mL  CBC with Differential     Status: Abnormal   Collection Time: 03/23/15  7:45 PM  Result Value Ref Range   WBC 15.8 (H) 4.0 - 10.5 K/uL   RBC 4.79 3.87 - 5.11 MIL/uL   Hemoglobin 13.2 12.0 - 15.0 g/dL   HCT 40.1 36.0 - 46.0 %   MCV 83.7 78.0 - 100.0 fL   MCH 27.6 26.0 - 34.0 pg   MCHC 32.9 30.0 - 36.0 g/dL   RDW 14.1 11.5 - 15.5 %   Platelets  322 150 - 400 K/uL   Neutrophils Relative % 79 (H) 43 - 77 %   Neutro Abs 12.4 (H) 1.7 - 7.7 K/uL   Lymphocytes Relative 14 12 - 46 %   Lymphs Abs 2.3 0.7 - 4.0 K/uL   Monocytes Relative 7 3 - 12 %   Monocytes Absolute 1.0 0.1 - 1.0 K/uL   Eosinophils Relative 0 0 - 5 %   Eosinophils Absolute 0.1 0.0 - 0.7 K/uL   Basophils Relative 0 0 - 1 %   Basophils Absolute 0.0 0.0 - 0.1 K/uL  Troponin I     Status: Abnormal   Collection Time: 03/23/15  7:45 PM  Result Value Ref Range   Troponin I 0.08 (H) <0.031 ng/mL   Dg Chest 2 View  03/23/2015   CLINICAL DATA:  Shortness of breath for several weeks with exertion. Initial encounter  EXAM: CHEST  2 VIEW  COMPARISON:  05/15/2010  FINDINGS: The aorta is unfolded and ectatic. Curvilinear left lower lobe airspace opacities are identified. Right lung is clear. Heart size upper limits of normal. No pleural effusion.  IMPRESSION: Thin curvilinear left lower lobe airspace opacities most likely indicating atelectasis or possibly scarring developing in the interval since the prior exam. Early pneumonia could appear similar but is less likely.   Electronically Signed   By: Conchita Paris M.D.   On: 03/23/2015 21:31     ASSESSMENT: 13F with untreated HTN who presents from urgent care with hypertensive emergency and NSTEMI.   PLAN/DISCUSSION:  # Hypertensive emergency/NSTEMI:   NSTEMI is likely due to hypertensive emergency rather than ACS, however her EKG shows a prior anteroseptal infarct.  She does not currently have any CP.  Patient has difficulty affording medications and this admission, so will be as cost-conscious as possible with prescribing. - Start carvedilol 12.5mg  bid.  Will likely titrate up prior to discharge.  However, do not want to lower her BP too much acutely - NTG infusion - lasix 40mg  IV - ASA 81mg  po daily - Heparin infusion - Atorvastatin 80mg  - LHC vs presumptive treatment for CAD given her cost concerns.  Could treat medically and  only perform LHC if she has persistent symptoms when BP is controlled - TTE  # Constipation: - miralax  # Likely OSA: Daughter reports that she stops breathing overnight.  Likely OSA.  Will need outpatient sleep study.  #  Code: Full -

## 2015-03-23 NOTE — ED Notes (Signed)
Cardiology at bedside.

## 2015-03-23 NOTE — ED Notes (Signed)
Reports SOB onset 2 weeks; earlier today she was unable to walk due to difficult breathing Sx also include weakness, fatigue Last had BP meds 2 yrs +++ due to lack of medical ins Denies CP, HA, blurry vision Alert and responsive w/no signs of acute distress.

## 2015-03-23 NOTE — Progress Notes (Signed)
ANTICOAGULATION CONSULT NOTE - Initial Consult  Pharmacy Consult for Heparin  Indication: chest pain/ACS  Allergies  Allergen Reactions  . Vioxx [Rofecoxib] Other (See Comments)    Bleeding  . Compazine [Prochlorperazine Edisylate] Swelling    Vital Signs: Temp: 99.4 F (37.4 C) (04/02 1920) Temp Source: Oral (04/02 1920) BP: 213/112 mmHg (04/02 2300) Pulse Rate: 77 (04/02 2300)  Labs:  Recent Labs  03/23/15 1945  HGB 13.2  HCT 40.1  PLT 322  CREATININE 1.20*  TROPONINI 0.08*    Medical History: Past Medical History  Diagnosis Date  . Hypertension    Assessment: Heparin for mildly elevated troponin. CBC ok, Scr 1.2, other labs as above.   Goal of Therapy:  Heparin level 0.3-0.7 units/ml Monitor platelets by anticoagulation protocol: Yes   Plan:  -Heparin 4000 units BOLUS -Start heparin infusion at 1100 units/hr -0700 HL -Daily CBC/HL -Monitor for bleeding  Narda Bonds 03/23/2015,11:22 PM

## 2015-03-23 NOTE — ED Provider Notes (Signed)
Pamela Campbell is a 54 y.o. female who presents to Urgent Care today for shortness of breath with exertion. Patient has a long history of poorly controlled hypertension. She has been without her blood pressure medicine for over 2 years now. She was in her normal state of health until today when she developed significant shortness of breath with exertion. She was unable to walk more than a few feet without having to stop to catch her breath. She denies any chest pains or palpitations. She notes bilateral lower extremity swelling. No fevers or chills.   Past Medical History  Diagnosis Date  . Hypertension    Past Surgical History  Procedure Laterality Date  . Fracture surgery    . Tubal ligation    . Cesarean section     History  Substance Use Topics  . Smoking status: Never Smoker   . Smokeless tobacco: Not on file  . Alcohol Use: No   ROS as above Medications: No current facility-administered medications for this encounter.   Current Outpatient Prescriptions  Medication Sig Dispense Refill  . HYDROcodone-acetaminophen (NORCO/VICODIN) 5-325 MG per tablet Take 1 tablet by mouth every 6 (six) hours as needed for pain. 20 tablet 0  . lisinopril-hydrochlorothiazide (PRINZIDE,ZESTORETIC) 10-12.5 MG per tablet Take 1 tablet by mouth daily. 30 tablet 0  . traMADol (ULTRAM) 50 MG tablet Take 1 tablet (50 mg total) by mouth every 8 (eight) hours as needed for pain. 30 tablet 0   Allergies  Allergen Reactions  . Compazine [Prochlorperazine Edisylate]     swelling  . Vioxx [Rofecoxib]     Bleeding     Exam:  BP 234/116 mmHg  Pulse 96  Temp(Src) 97.8 F (36.6 C) (Oral)  Resp 20  SpO2 100% Gen: Well NAD morbidly obese HEENT: EOMI,  MMM Lungs: Normal work of breathing. CTABL no wheezing Heart: RRR no MRG Abd: NABS, Soft. Nondistended, Nontender Exts: Brisk capillary refill, warm and well perfused. 1+ edema bilateral lower extremities  ED ECG REPORT   Date: 03/23/2015  Rate: 93  bpm  Rhythm: normal sinus rhythm  QRS Axis: normal  Intervals: normal  ST/T Wave abnormalities: Flattened lateral precordial T waves  Conduction Disutrbances:none  Narrative Interpretation:   Old EKG Reviewed: none available  I have personally reviewed the EKG tracing and agree with the computerized printout as noted.   No results found for this or any previous visit (from the past 24 hour(s)). No results found.  Assessment and Plan: 54 y.o. female with uncontrolled hypertension with new onset shortness of breath with exertion. This is also associated with mild leg swelling. Transfer to emergency department via shuttle for further evaluation and management.  Discussed warning signs or symptoms. Please see discharge instructions. Patient expresses understanding.     Gregor Hams, MD 03/23/15 [phone]

## 2015-03-23 NOTE — ED Notes (Signed)
The pt has not had any bp med for 2 years

## 2015-03-23 NOTE — ED Notes (Signed)
The pt is c/o some sob for 2 weeks no previous history.  [facility] where she had an ekg then sent down here for treatment.  The pt just started a new job 3 weeks ago and there is much dirt and dust and there are many people there having difficulty breathing.  She has a history of hypertension only.  No asthma  No copd  No pain anywhere  lmp none

## 2015-03-24 DIAGNOSIS — I119 Hypertensive heart disease without heart failure: Secondary | ICD-10-CM | POA: Diagnosis present

## 2015-03-24 DIAGNOSIS — R0602 Shortness of breath: Secondary | ICD-10-CM | POA: Insufficient documentation

## 2015-03-24 DIAGNOSIS — R05 Cough: Secondary | ICD-10-CM | POA: Insufficient documentation

## 2015-03-24 DIAGNOSIS — R7989 Other specified abnormal findings of blood chemistry: Secondary | ICD-10-CM | POA: Diagnosis present

## 2015-03-24 DIAGNOSIS — R059 Cough, unspecified: Secondary | ICD-10-CM | POA: Insufficient documentation

## 2015-03-24 DIAGNOSIS — R778 Other specified abnormalities of plasma proteins: Secondary | ICD-10-CM | POA: Diagnosis present

## 2015-03-24 LAB — LIPID PANEL
CHOLESTEROL: 239 mg/dL — AB (ref 0–200)
HDL: 56 mg/dL (ref >39)
LDL Cholesterol: 156 mg/dL — ABNORMAL HIGH (ref 0–99)
Total CHOL/HDL Ratio: 4.3 RATIO
Triglycerides: 137 mg/dL (ref <150)
VLDL: 27 mg/dL (ref 0–40)

## 2015-03-24 LAB — URINALYSIS, ROUTINE W REFLEX MICROSCOPIC
Bilirubin Urine: NEGATIVE
GLUCOSE, UA: 250 mg/dL — AB
Ketones, ur: NEGATIVE mg/dL
Leukocytes, UA: NEGATIVE
NITRITE: NEGATIVE
Protein, ur: >300 mg/dL — AB
Specific Gravity, Urine: 1.008 (ref 1.005–1.030)
Urobilinogen, UA: 0.2 mg/dL (ref 0.0–1.0)
pH: 6 (ref 5.0–8.0)

## 2015-03-24 LAB — BASIC METABOLIC PANEL
Anion gap: 8 (ref 5–15)
BUN: 12 mg/dL (ref 6–23)
CALCIUM: 8.8 mg/dL (ref 8.4–10.5)
CO2: 27 mmol/L (ref 19–32)
CREATININE: 1.26 mg/dL — AB (ref 0.50–1.10)
Chloride: 103 mmol/L (ref 96–112)
GFR calc Af Amer: 56 mL/min — ABNORMAL LOW (ref >90)
GFR, EST NON AFRICAN AMERICAN: 48 mL/min — AB (ref >90)
GLUCOSE: 286 mg/dL — AB (ref 70–99)
POTASSIUM: 3.8 mmol/L (ref 3.5–5.1)
Sodium: 138 mmol/L (ref 135–145)

## 2015-03-24 LAB — TROPONIN I
TROPONIN I: 0.1 ng/mL — AB (ref <0.031)
Troponin I: 0.1 ng/mL — ABNORMAL HIGH (ref <0.031)

## 2015-03-24 LAB — URINE MICROSCOPIC-ADD ON

## 2015-03-24 LAB — HEPARIN LEVEL (UNFRACTIONATED): Heparin Unfractionated: 0.16 IU/mL — ABNORMAL LOW (ref 0.30–0.70)

## 2015-03-24 LAB — GLUCOSE, CAPILLARY
GLUCOSE-CAPILLARY: 368 mg/dL — AB (ref 70–99)
Glucose-Capillary: 297 mg/dL — ABNORMAL HIGH (ref 70–99)
Glucose-Capillary: 174 mg/dL — ABNORMAL HIGH (ref 70–99)
Glucose-Capillary: 270 mg/dL — ABNORMAL HIGH (ref 70–99)
Glucose-Capillary: 262 mg/dL — ABNORMAL HIGH (ref 70–99)

## 2015-03-24 LAB — CBC
HEMATOCRIT: 37.5 % (ref 36.0–46.0)
HEMOGLOBIN: 12 g/dL (ref 12.0–15.0)
MCH: 27 pg (ref 26.0–34.0)
MCHC: 32 g/dL (ref 30.0–36.0)
MCV: 84.3 fL (ref 78.0–100.0)
Platelets: 286 10*3/uL (ref 150–400)
RBC: 4.45 MIL/uL (ref 3.87–5.11)
RDW: 14.2 % (ref 11.5–15.5)
WBC: 13.4 10*3/uL — ABNORMAL HIGH (ref 4.0–10.5)

## 2015-03-24 MED ORDER — INSULIN ASPART 100 UNIT/ML ~~LOC~~ SOLN
5.0000 [IU] | Freq: Once | SUBCUTANEOUS | Status: AC
  Administered 2015-03-24: 5 [IU] via SUBCUTANEOUS

## 2015-03-24 MED ORDER — ALBUTEROL SULFATE (2.5 MG/3ML) 0.083% IN NEBU
2.5000 mg | INHALATION_SOLUTION | Freq: Four times a day (QID) | RESPIRATORY_TRACT | Status: DC | PRN
  Administered 2015-03-24 – 2015-03-25 (×4): 2.5 mg via RESPIRATORY_TRACT
  Filled 2015-03-24 (×4): qty 3

## 2015-03-24 MED ORDER — INSULIN ASPART 100 UNIT/ML ~~LOC~~ SOLN
0.0000 [IU] | Freq: Every day | SUBCUTANEOUS | Status: DC
  Administered 2015-03-24 – 2015-03-26 (×2): 3 [IU] via SUBCUTANEOUS

## 2015-03-24 MED ORDER — ENOXAPARIN SODIUM 60 MG/0.6ML ~~LOC~~ SOLN
50.0000 mg | SUBCUTANEOUS | Status: DC
  Administered 2015-03-24 – 2015-03-26 (×3): 50 mg via SUBCUTANEOUS
  Filled 2015-03-24 (×4): qty 0.6

## 2015-03-24 MED ORDER — AZITHROMYCIN 500 MG PO TABS
500.0000 mg | ORAL_TABLET | Freq: Every day | ORAL | Status: AC
  Administered 2015-03-24: 500 mg via ORAL
  Filled 2015-03-24: qty 1

## 2015-03-24 MED ORDER — HEPARIN BOLUS VIA INFUSION
2000.0000 [IU] | Freq: Once | INTRAVENOUS | Status: AC
  Administered 2015-03-24: 2000 [IU] via INTRAVENOUS
  Filled 2015-03-24: qty 2000

## 2015-03-24 MED ORDER — INSULIN ASPART 100 UNIT/ML ~~LOC~~ SOLN
0.0000 [IU] | Freq: Three times a day (TID) | SUBCUTANEOUS | Status: DC
  Administered 2015-03-24: 3 [IU] via SUBCUTANEOUS
  Administered 2015-03-24 (×2): 8 [IU] via SUBCUTANEOUS
  Administered 2015-03-25: 11 [IU] via SUBCUTANEOUS
  Administered 2015-03-25 (×2): 5 [IU] via SUBCUTANEOUS
  Administered 2015-03-26 – 2015-03-27 (×5): 3 [IU] via SUBCUTANEOUS
  Administered 2015-03-27: 2 [IU] via SUBCUTANEOUS
  Administered 2015-03-28: 3 [IU] via SUBCUTANEOUS
  Administered 2015-03-28: 2 [IU] via SUBCUTANEOUS

## 2015-03-24 MED ORDER — INFLUENZA VAC SPLIT QUAD 0.5 ML IM SUSY
0.5000 mL | PREFILLED_SYRINGE | INTRAMUSCULAR | Status: AC
  Administered 2015-03-26: 0.5 mL via INTRAMUSCULAR
  Filled 2015-03-24 (×2): qty 0.5

## 2015-03-24 MED ORDER — AMLODIPINE BESYLATE 5 MG PO TABS
5.0000 mg | ORAL_TABLET | Freq: Every day | ORAL | Status: DC
  Administered 2015-03-24 – 2015-03-26 (×3): 5 mg via ORAL
  Filled 2015-03-24 (×3): qty 1

## 2015-03-24 MED ORDER — BENZONATATE 100 MG PO CAPS
100.0000 mg | ORAL_CAPSULE | Freq: Two times a day (BID) | ORAL | Status: DC | PRN
  Administered 2015-03-24 – 2015-03-26 (×5): 100 mg via ORAL
  Filled 2015-03-24 (×7): qty 1

## 2015-03-24 MED ORDER — AZITHROMYCIN 250 MG PO TABS
250.0000 mg | ORAL_TABLET | Freq: Every day | ORAL | Status: AC
  Administered 2015-03-25 – 2015-03-28 (×4): 250 mg via ORAL
  Filled 2015-03-24 (×4): qty 1

## 2015-03-24 NOTE — Progress Notes (Signed)
SUBJECTIVE:  This morning the patient has a cough. It is not productive. Blood pressure remains elevated. She had slight troponin elevation with admission. However the trend has remained flat. She had mild diuresis. Her chest x-ray was not read as CHF.   Filed Vitals:   03/24/15 0108 03/24/15 0407 03/24/15 0500 03/24/15 0941  BP: 179/89 166/91 168/89 161/87  Pulse: 79 75 75 81  Temp: 98.9 F (37.2 C) 98.8 F (37.1 C)    TempSrc: Oral Oral    Resp: 20 20    Height: 5\' 4"  (1.626 m)     Weight: 246 lb 3.2 oz (111.676 kg)     SpO2: 94% 95%       Intake/Output Summary (Last 24 hours) at 03/24/15 1110 Last data filed at 03/24/15 0534  Gross per 24 hour  Intake      0 ml  Output    750 ml  Net   -750 ml    LABS: Basic Metabolic Panel:  Recent Labs  03/23/15 1945 03/24/15 0545  NA 136 138  K 3.9 3.8  CL 100 103  CO2 29 27  GLUCOSE 288* 286*  BUN 13 12  CREATININE 1.20* 1.26*  CALCIUM 9.4 8.8   Liver Function Tests: No results for input(s): AST, ALT, ALKPHOS, BILITOT, PROT, ALBUMIN in the last 72 hours. No results for input(s): LIPASE, AMYLASE in the last 72 hours. CBC:  Recent Labs  03/23/15 1945 03/24/15 0545  WBC 15.8* 13.4*  NEUTROABS 12.4*  --   HGB 13.2 12.0  HCT 40.1 37.5  MCV 83.7 84.3  PLT 322 286   Cardiac Enzymes:  Recent Labs  03/23/15 1945 03/23/15 2340 03/24/15 0545  TROPONINI 0.08* 0.10* 0.10*   BNP: Invalid input(s): POCBNP D-Dimer: No results for input(s): DDIMER in the last 72 hours. Hemoglobin A1C: No results for input(s): HGBA1C in the last 72 hours. Fasting Lipid Panel:  Recent Labs  03/24/15 0545  CHOL 239*  HDL 56  LDLCALC 156*  TRIG 137  CHOLHDL 4.3   Thyroid Function Tests: No results for input(s): TSH, T4TOTAL, T3FREE, THYROIDAB in the last 72 hours.  Invalid input(s): FREET3  RADIOLOGY: Dg Chest 2 View  03/23/2015   CLINICAL DATA:  Shortness of breath for several weeks with exertion. Initial encounter   EXAM: CHEST  2 VIEW  COMPARISON:  05/15/2010  FINDINGS: The aorta is unfolded and ectatic. Curvilinear left lower lobe airspace opacities are identified. Right lung is clear. Heart size upper limits of normal. No pleural effusion.  IMPRESSION: Thin curvilinear left lower lobe airspace opacities most likely indicating atelectasis or possibly scarring developing in the interval since the prior exam. Early pneumonia could appear similar but is less likely.   Electronically Signed   By: Conchita Paris M.D.   On: 03/23/2015 21:31    PHYSICAL EXAM  patient is overweight. She is oriented to person time and place. Affect is normal. She says that her chest is sore from coughing. Head is atraumatic. Sclera and conjunctiva are normal. There is no jugular venous distention. Lungs reveal scattered rhonchi. Cardiac exam reveals S1 and S2. The abdomen is soft. There is trace peripheral edema.  TELEMETRY: I have reviewed telemetry today March 24, 2015. There is normal sinus rhythm.  ASSESSMENT AND PLAN:    Elevated troponin    At this point, I am not convinced that there has been a non-STEMI. Troponin values are flat. I will check another troponin tomorrow.    Essential  hypertension     Patient's blood pressure remains high. I will adjust meds.    Cough   Shortness of breath     The patient today complains of a cough. I suspect that shortness of breath on admission may be related to some volume and some bronchitis. I'm starting a Z-Pak and continuing diuresis. Two-dimensional echo has not yet been done.   Dola Argyle 03/24/2015 11:10 AM

## 2015-03-24 NOTE — Progress Notes (Signed)
Patient called out with complaints of SOB; vital signs taken BP 185/98, RR 16, O2 sat 96% on room air, HR 78; notified Dr. Ron Parker of patients complaints; MD ordered new BP meds and antibiotic; will continue to monitor and assess patient.  Rowe Pavy, RN

## 2015-03-24 NOTE — Progress Notes (Signed)
Patient's cough worsened, felt maybe bronchitis vs PNA on top of HF, started azithromycin, first dose 12pm today.  Checked on the patient, continue to cough. Will order breathing treatment as well. If cough does not get better by tomorrow morning or has Tmax >100.4, may need to repeat CXR in AM.  Signed, Almyra Deforest PA Pager: [phone]

## 2015-03-24 NOTE — Progress Notes (Addendum)
ANTICOAGULATION CONSULT NOTE - Initial Consult  Pharmacy Consult for Heparin  Indication: chest pain/ACS  Allergies  Allergen Reactions  . Vioxx [Rofecoxib] Other (See Comments)    Bleeding  . Compazine [Prochlorperazine Edisylate] Swelling    Vital Signs: Temp: 98.8 F (37.1 C) (04/03 0407) Temp Source: Oral (04/03 0407) BP: 168/89 mmHg (04/03 0500) Pulse Rate: 75 (04/03 0500)  Labs:  Recent Labs  03/23/15 1945 03/23/15 2340 03/24/15 0545  HGB 13.2  --  12.0  HCT 40.1  --  37.5  PLT 322  --  286  HEPARINUNFRC  --   --  0.16*  CREATININE 1.20*  --   --   TROPONINI 0.08* 0.10*  --     Medical History: Past Medical History  Diagnosis Date  . Hypertension    Assessment: Heparin for mildly elevated troponin. CBC ok, Scr 1.2, other labs as above.   Initial HL is SUBtherapeutic at 0.16 on heparin 1100 units/hr. Nurse notes no bleeding or issues with infusion.   Goal of Therapy:  Heparin level 0.3-0.7 units/ml Monitor platelets by anticoagulation protocol: Yes   Plan:  -Heparin 2000 units BOLUS -Start heparin infusion at 1400 units/hr -6h HL -Daily CBC/HL -Monitor for bleeding  Andrey Cota. Diona Foley, PharmD Clinical Pharmacist Pager [phone] 03/24/2015,7:04 AM  Addendum: IV heparin stopped and orders received for sq lovenox for dvt px.  Patient with BMI >40 will use 0.5mg /kg/day  Lovenox 50mg  q24h to start [place] PharmD., BCPS Clinical Pharmacist Pager [phone] 03/24/2015 11:56 AM

## 2015-03-24 NOTE — Progress Notes (Signed)
Pt was given educational handouts on Amlodipine, Carvedilol, Atorvastatin, Furosemide and aspirin.  Rowe Pavy, RN

## 2015-03-24 NOTE — Plan of Care (Signed)
Problem: Consults Goal: Diabetes Guidelines if Diabetic/Glucose > 140 If diabetic or lab glucose is > 140 mg/dl - Initiate Diabetes/Hyperglycemia Guidelines & Document Interventions  Outcome: Completed/Met Date Met:  03/24/15 MD notified of elevated CBG. Awaiting orders.

## 2015-03-24 NOTE — Progress Notes (Signed)
UR Completed.  [phone]  

## 2015-03-25 DIAGNOSIS — I5030 Unspecified diastolic (congestive) heart failure: Secondary | ICD-10-CM | POA: Diagnosis present

## 2015-03-25 DIAGNOSIS — I5033 Acute on chronic diastolic (congestive) heart failure: Principal | ICD-10-CM

## 2015-03-25 DIAGNOSIS — I340 Nonrheumatic mitral (valve) insufficiency: Secondary | ICD-10-CM

## 2015-03-25 DIAGNOSIS — E669 Obesity, unspecified: Secondary | ICD-10-CM | POA: Diagnosis present

## 2015-03-25 DIAGNOSIS — N289 Disorder of kidney and ureter, unspecified: Secondary | ICD-10-CM

## 2015-03-25 DIAGNOSIS — I10 Essential (primary) hypertension: Secondary | ICD-10-CM | POA: Diagnosis present

## 2015-03-25 DIAGNOSIS — R05 Cough: Secondary | ICD-10-CM

## 2015-03-25 DIAGNOSIS — E785 Hyperlipidemia, unspecified: Secondary | ICD-10-CM | POA: Diagnosis present

## 2015-03-25 DIAGNOSIS — I5032 Chronic diastolic (congestive) heart failure: Secondary | ICD-10-CM | POA: Diagnosis present

## 2015-03-25 DIAGNOSIS — G4730 Sleep apnea, unspecified: Secondary | ICD-10-CM | POA: Diagnosis present

## 2015-03-25 LAB — BASIC METABOLIC PANEL
ANION GAP: 11 (ref 5–15)
BUN: 22 mg/dL (ref 6–23)
CALCIUM: 8.6 mg/dL (ref 8.4–10.5)
CHLORIDE: 99 mmol/L (ref 96–112)
CO2: 25 mmol/L (ref 19–32)
CREATININE: 1.68 mg/dL — AB (ref 0.50–1.10)
GFR calc Af Amer: 39 mL/min — ABNORMAL LOW (ref >90)
GFR calc non Af Amer: 34 mL/min — ABNORMAL LOW (ref >90)
Glucose, Bld: 225 mg/dL — ABNORMAL HIGH (ref 70–99)
Potassium: 4 mmol/L (ref 3.5–5.1)
Sodium: 135 mmol/L (ref 135–145)

## 2015-03-25 LAB — GLUCOSE, CAPILLARY
GLUCOSE-CAPILLARY: 311 mg/dL — AB (ref 70–99)
Glucose-Capillary: 248 mg/dL — ABNORMAL HIGH (ref 70–99)
Glucose-Capillary: 201 mg/dL — ABNORMAL HIGH (ref 70–99)
Glucose-Capillary: 186 mg/dL — ABNORMAL HIGH (ref 70–99)

## 2015-03-25 LAB — D-DIMER, QUANTITATIVE (NOT AT ARMC): D DIMER QUANT: 0.58 ug{FEU}/mL — AB (ref 0.00–0.48)

## 2015-03-25 LAB — CBC
HCT: 36.4 % (ref 36.0–46.0)
Hemoglobin: 11.6 g/dL — ABNORMAL LOW (ref 12.0–15.0)
MCH: 27.2 pg (ref 26.0–34.0)
MCHC: 31.9 g/dL (ref 30.0–36.0)
MCV: 85.4 fL (ref 78.0–100.0)
Platelets: 287 10*3/uL (ref 150–400)
RBC: 4.26 MIL/uL (ref 3.87–5.11)
RDW: 14.7 % (ref 11.5–15.5)
WBC: 20.8 10*3/uL — ABNORMAL HIGH (ref 4.0–10.5)

## 2015-03-25 LAB — TROPONIN I: Troponin I: 0.18 ng/mL — ABNORMAL HIGH (ref <0.031)

## 2015-03-25 LAB — HEMOGLOBIN A1C
Hgb A1c MFr Bld: 12.7 % — ABNORMAL HIGH (ref 4.8–5.6)
MEAN PLASMA GLUCOSE: 318 mg/dL

## 2015-03-25 MED ORDER — INSULIN STARTER KIT- PEN NEEDLES (ENGLISH)
1.0000 | Freq: Once | Status: AC
Start: 2015-03-25 — End: 2015-03-26
  Administered 2015-03-26: 1
  Filled 2015-03-25: qty 1

## 2015-03-25 MED ORDER — LEVALBUTEROL HCL 0.63 MG/3ML IN NEBU
0.6300 mg | INHALATION_SOLUTION | Freq: Three times a day (TID) | RESPIRATORY_TRACT | Status: DC
  Administered 2015-03-25 – 2015-03-26 (×2): 0.63 mg via RESPIRATORY_TRACT
  Filled 2015-03-25 (×4): qty 3

## 2015-03-25 MED ORDER — AMLODIPINE BESYLATE 5 MG PO TABS: 5.0000 mg | ORAL_TABLET | Freq: Every day | ORAL | Status: DC

## 2015-03-25 MED ORDER — LEVALBUTEROL HCL 0.63 MG/3ML IN NEBU
0.6300 mg | INHALATION_SOLUTION | Freq: Four times a day (QID) | RESPIRATORY_TRACT | Status: DC
  Filled 2015-03-25: qty 3

## 2015-03-25 MED ORDER — ATORVASTATIN CALCIUM 80 MG PO TABS: 80.0000 mg | ORAL_TABLET | Freq: Every day | ORAL | Status: DC

## 2015-03-25 MED ORDER — INSULIN GLARGINE 100 UNIT/ML ~~LOC~~ SOLN
15.0000 [IU] | Freq: Every day | SUBCUTANEOUS | Status: DC
  Administered 2015-03-25 – 2015-03-28 (×4): 15 [IU] via SUBCUTANEOUS
  Filled 2015-03-25 (×4): qty 0.15

## 2015-03-25 MED ORDER — CARVEDILOL 12.5 MG PO TABS: 12.5000 mg | ORAL_TABLET | Freq: Two times a day (BID) | ORAL | Status: DC

## 2015-03-25 MED ORDER — FUROSEMIDE 10 MG/ML IJ SOLN
40.0000 mg | Freq: Once | INTRAMUSCULAR | Status: AC
  Administered 2015-03-25: 40 mg via INTRAVENOUS
  Filled 2015-03-25: qty 4

## 2015-03-25 MED ORDER — INSULIN GLARGINE 100 UNIT/ML ~~LOC~~ SOLN: 15.0000 [IU] | SUBCUTANEOUS | Status: DC

## 2015-03-25 MED ORDER — ASPIRIN 81 MG PO TBEC: 81.0000 mg | DELAYED_RELEASE_TABLET | Freq: Every day | ORAL | Status: DC

## 2015-03-25 MED ORDER — METOPROLOL TARTRATE 25 MG PO TABS
25.0000 mg | ORAL_TABLET | Freq: Two times a day (BID) | ORAL | Status: DC
  Administered 2015-03-25: 25 mg via ORAL
  Filled 2015-03-25 (×3): qty 1

## 2015-03-25 MED ORDER — NITROGLYCERIN 0.4 MG SL SUBL: 0.4000 mg | SUBLINGUAL_TABLET | SUBLINGUAL | Status: DC | PRN

## 2015-03-25 MED ORDER — LIVING WELL WITH DIABETES BOOK
Freq: Once | Status: AC
  Administered 2015-03-25: 14:00:00
  Filled 2015-03-25: qty 1

## 2015-03-25 MED ORDER — AZITHROMYCIN 250 MG PO TABS: ORAL_TABLET | ORAL | Status: DC

## 2015-03-25 MED ORDER — LEVALBUTEROL HCL 0.63 MG/3ML IN NEBU
0.6300 mg | INHALATION_SOLUTION | Freq: Four times a day (QID) | RESPIRATORY_TRACT | Status: DC | PRN
  Administered 2015-03-26: 0.63 mg via RESPIRATORY_TRACT
  Filled 2015-03-25: qty 3

## 2015-03-25 MED ORDER — ACETAMINOPHEN 325 MG PO TABS
650.0000 mg | ORAL_TABLET | ORAL | Status: DC | PRN
Start: 2015-03-25 — End: 2016-12-03

## 2015-03-25 NOTE — Progress Notes (Addendum)
Inpatient Diabetes Program Recommendations  AACE/ADA: New Consensus Statement on Inpatient Glycemic Control (2013)  Target Ranges:  Prepandial:   less than 140 mg/dL      Peak postprandial:   less than 180 mg/dL (1-2 hours)      Critically ill patients:  140 - 180 mg/dL   Consult received re 'new onset dm'. Last HgbA1C at 9.7% on 05/08/2013. Question as to any previous dx and education received at that time (?)  Will talk with patient and asses needs.  Will order patient education and RD consult as well.  Inpatient Diabetes Program Recommendations Insulin - Basal: Please consider addition of lantus starting with 15 units daily. Pt at weight of 121 kg. Using the lowest dose at this weight, at 0.2 units//dg, pt would need minimal of 24 units. HgbA1C: No A1C results-very much needed.  Ad. HgbA1C results today are 12.7%  Thank you Rosita Kea, RN, MSN, CDE  Diabetes Inpatient Program Office: [phone] Pager: [phone] 8:00 am to 5:00 pm

## 2015-03-25 NOTE — Progress Notes (Signed)
CARE MANAGEMENT NOTE 03/25/2015  Patient:  Pamela Campbell, Pamela Campbell   Account Number:  [id number]  Date Initiated:  03/25/2015  Documentation initiated by:  [facility]  Subjective/Objective Assessment:   DM     Action/Plan:   Anticipated DC Date:  03/25/2015   Anticipated DC Plan:  [place]  CM consult  Medication Assistance  [facility]      Choice offered to / List presented to:             Status of service:  Completed, signed off Medicare Important Message given?   (If response is "NO", the following Medicare IM given date fields will be blank) Date Medicare IM given:   Medicare IM given by:   Date Additional Medicare IM given:   Additional Medicare IM given by:    Discharge Disposition:  HOME/SELF CARE  Per UR Regulation:    If discussed at Long Length of Stay Meetings, dates discussed:    Comments:  03/25/2015 1500 NCM spoke to pt and she is working but she feels she has respiratory issues from job. She only worked 3 weeks. [place] appt arranged for 4/6 at 9:30 am. Explained to pt she can pick up meds from [facility] pharmacy at discount. Pt can pick up a glucometer with Rx from the [facility]. Jonnie Finner RN CCM Case Mgmt phone [phone]

## 2015-03-25 NOTE — Progress Notes (Signed)
  Echocardiogram 2D Echocardiogram has been performed.  Darlina Sicilian M 03/25/2015, 10:45 AM

## 2015-03-25 NOTE — Progress Notes (Signed)
Ad- Diabetes coordinator: Was able to see patient while case mgr was in the room with patient. Although pt still extremely sleepy, I explained to her the potentia need for insulin instruction as Lantus at 15 units is to start tonight. Explained the action of insulin, but patient was nodding off.  Care management has made appt with [facility] for Wed, 03/27/2015 at 9:30 am. Case management was under the impression that pt would be discharged today. Pt still needs education as to insulin administration; Since she has appt to go [facility], she will be able to get her insulin for free Will also give her the lantus savings card. I will check back on her tomorrow am and assist with insulin pen teaching. Pt to check her own cbg's while here using hospital emter.  Thank you Rosita Kea, RN, MSN, CDE  Diabetes Inpatient Program Office: [phone] Pager: [phone] 8:00 am to 5:00 pm

## 2015-03-25 NOTE — Progress Notes (Signed)
IV nitroglycerin off for over 30 minutes due to IV occlusion. BP 129/64. Will leave off and check with MD.

## 2015-03-25 NOTE — Discharge Instructions (Signed)

## 2015-03-25 NOTE — Progress Notes (Signed)
Patient Name: Pamela Campbell Date of Encounter: 03/25/2015  Active Problems:   Elevated troponin   Essential hypertension   Cough   Shortness of breath   Length of Stay: 2  SUBJECTIVE  Feeling much better than on admission. Abdominal muscles are tender from continuous coughing. Cough produces scanty amounts of mucoid sputum. He does not appear to be worsened by supine position. She has had low-grade fever up to 100F. Chest x-ray did not support either pneumonia or heart failure. Denies pleurisy or hemoptysis.  CURRENT MEDS . amLODipine  5 mg Oral Daily  . aspirin EC  81 mg Oral Daily  . atorvastatin  80 mg Oral q1800  . azithromycin  250 mg Oral Daily  . carvedilol  12.5 mg Oral BID WC  . enoxaparin (LOVENOX) injection  50 mg Subcutaneous Q24H  . Influenza vac split quadrivalent PF  0.5 mL Intramuscular Tomorrow-1000  . insulin aspart  0-15 Units Subcutaneous TID WC  . insulin aspart  0-5 Units Subcutaneous QHS  . polyethylene glycol  17 g Oral Daily    OBJECTIVE   Intake/Output Summary (Last 24 hours) at 03/25/15 1007 Last data filed at 03/25/15 0919  Gross per 24 hour  Intake    840 ml  Output    650 ml  Net    190 ml   Filed Weights   03/24/15 0108 03/25/15 0646  Weight: 246 lb 3.2 oz (111.676 kg) 266 lb 1.5 oz (120.7 kg)    PHYSICAL EXAM Filed Vitals:   03/25/15 0053 03/25/15 0627 03/25/15 0646 03/25/15 0957  BP: 146/71 147/69  129/64  Pulse: 77 73    Temp:  99.3 F (37.4 C)    TempSrc:  Oral    Resp:  18    Height:      Weight:   266 lb 1.5 oz (120.7 kg)   SpO2:       General: Alert, oriented x3, no distress severe obesity limits her examination Head: no evidence of trauma, PERRL, EOMI, no exophtalmos or lid lag, no myxedema, no xanthelasma; normal ears, nose and oropharynx Neck: Unable to evaluate the jugular venous pulsations and hepatojugular reflux; brisk carotid pulses without delay and no carotid bruits Chest: She has mild upper airway stridor  and some wheezing concentrated in the mid anterior chest, no signs of consolidation by percussion or palpation, normal fremitus, symmetrical and full respiratory excursions Cardiovascular: normal position and quality of the apical impulse, regular rhythm, normal first and second heart sounds, no rubs or gallops, no murmur Abdomen: no tenderness or distention, no masses by palpation, no abnormal pulsatility or arterial bruits, normal bowel sounds, no hepatosplenomegaly Extremities: no clubbing, cyanosis or edema; 2+ radial, ulnar and brachial pulses bilaterally; 2+ right femoral, posterior tibial and dorsalis pedis pulses; 2+ left femoral, posterior tibial and dorsalis pedis pulses; no subclavian or femoral bruits Neurological: grossly nonfocal  LABS  CBC  Recent Labs  03/23/15 1945 03/24/15 0545 03/25/15 0357  WBC 15.8* 13.4* 20.8*  NEUTROABS 12.4*  --   --   HGB 13.2 12.0 11.6*  HCT 40.1 37.5 36.4  MCV 83.7 84.3 85.4  PLT 322 286 [id number]   Basic Metabolic Panel  Recent Labs  03/24/15 0545 03/25/15 0357  NA 138 135  K 3.8 4.0  CL 103 99  CO2 27 25  GLUCOSE 286* 225*  BUN 12 22  CREATININE 1.26* 1.68*  CALCIUM 8.8 8.6   Liver Function Tests No results for input(s): AST, ALT, ALKPHOS, BILITOT,  PROT, ALBUMIN in the last 72 hours. No results for input(s): LIPASE, AMYLASE in the last 72 hours. Cardiac Enzymes  Recent Labs  03/23/15 2340 03/24/15 0545 03/25/15 0357  TROPONINI 0.10* 0.10* 0.18*   BNP Invalid input(s): POCBNP D-Dimer No results for input(s): DDIMER in the last 72 hours. Hemoglobin A1C No results for input(s): HGBA1C in the last 72 hours. Fasting Lipid Panel  Recent Labs  03/24/15 0545  CHOL 239*  HDL 56  LDLCALC 156*  TRIG 137  CHOLHDL 4.3   Thyroid Function Tests No results for input(s): TSH, T4TOTAL, T3FREE, THYROIDAB in the last 72 hours.  Invalid input(s): [place]  Radiology Studies Imaging results have been reviewed and Dg Chest 2  View  03/23/2015   CLINICAL DATA:  Shortness of breath for several weeks with exertion. Initial encounter  EXAM: CHEST  2 VIEW  COMPARISON:  05/15/2010  FINDINGS: The aorta is unfolded and ectatic. Curvilinear left lower lobe airspace opacities are identified. Right lung is clear. Heart size upper limits of normal. No pleural effusion.  IMPRESSION: Thin curvilinear left lower lobe airspace opacities most likely indicating atelectasis or possibly scarring developing in the interval since the prior exam. Early pneumonia could appear similar but is less likely.   Electronically Signed   By: Conchita Paris M.D.   On: 03/23/2015 21:31    TELE NSR  ECG Normal sinus rhythm, QS pattern in leads V1-V2  ASSESSMENT AND PLAN  Suspect Mrs. Reutzel has acute on chronic diastolic heart failure related to untreated severe systemic hypertension with accelerated hypertension, but need firm evaluation of left ventricular systolic function by echo.  "Plateau " pattern of elevation in cardiac troponin suggest congestive heart failure rather than acute coronary syndrome.  Substantial improvement in blood pressure control on current medications. Discussed sodium restriction  Acute increase in BUN and creatinine, consistent with mild acute renal insufficiency. Suspect this is also related to hypertensive crisis. We'll avoid diuretics and ACE inhibitor/angiotensin receptor blockers for now, but she took lisinopril/hydrochlorothiazide in the past without problems. Avoid nonsteroidal anti-inflammatory drugs and other potential nephrotoxic agents.  Her current cough and low-grade fevers seem to reflect upper respiratory tract infection. Most of her "wheezing" seems to be large airway related. Will check a d-dimer. Obesity puts her at risk of venous thrombolic embolic disease.  Will need sleep study as an outpatient, for multiple symptoms suggestive of obstructive sleep apnea in the setting of severe  obesity.  Hyperlipidemia, statin initiated, need to recheck lipids in 3 months.   Will need case manager support for referral to indigent care clinic.   Sanda Klein, MD, [facility] [phone] office [phone] pager 03/25/2015 10:07 AM

## 2015-03-25 NOTE — Progress Notes (Signed)
Pt has watched 2 diabetes videos. Diabetes education book given. She has performed 2 self injections of insulin. We have reviewed lifestyle modification with diet and exercise, insulin therapy including injection, purpose of insulin, S & S of hypoglycemia, when to notify the doctor, regular glucose monitoring, A1C monitoring and goal. Pamela Campbell plans to perform her own CBG tonight and in the morning. She was interactive and verbalized understanding of all teaching.

## 2015-03-26 ENCOUNTER — Inpatient Hospital Stay (HOSPITAL_COMMUNITY): Payer: Self-pay

## 2015-03-26 DIAGNOSIS — R0602 Shortness of breath: Secondary | ICD-10-CM

## 2015-03-26 DIAGNOSIS — E785 Hyperlipidemia, unspecified: Secondary | ICD-10-CM

## 2015-03-26 DIAGNOSIS — J189 Pneumonia, unspecified organism: Secondary | ICD-10-CM

## 2015-03-26 DIAGNOSIS — E669 Obesity, unspecified: Secondary | ICD-10-CM

## 2015-03-26 DIAGNOSIS — J069 Acute upper respiratory infection, unspecified: Secondary | ICD-10-CM

## 2015-03-26 DIAGNOSIS — G4730 Sleep apnea, unspecified: Secondary | ICD-10-CM

## 2015-03-26 DIAGNOSIS — J810 Acute pulmonary edema: Secondary | ICD-10-CM

## 2015-03-26 LAB — EXPECTORATED SPUTUM ASSESSMENT W GRAM STAIN, RFLX TO RESP C: Special Requests: NORMAL

## 2015-03-26 LAB — HEMOGLOBIN A1C
Hgb A1c MFr Bld: 12.4 % — ABNORMAL HIGH (ref 4.8–5.6)
Mean Plasma Glucose: 309 mg/dL

## 2015-03-26 LAB — BASIC METABOLIC PANEL
Anion gap: 10 (ref 5–15)
BUN: 20 mg/dL (ref 6–23)
CO2: 27 mmol/L (ref 19–32)
Calcium: 8.3 mg/dL — ABNORMAL LOW (ref 8.4–10.5)
Chloride: 99 mmol/L (ref 96–112)
Creatinine, Ser: 1.48 mg/dL — ABNORMAL HIGH (ref 0.50–1.10)
GFR calc Af Amer: 46 mL/min — ABNORMAL LOW (ref >90)
GFR calc non Af Amer: 40 mL/min — ABNORMAL LOW (ref >90)
Glucose, Bld: 171 mg/dL — ABNORMAL HIGH (ref 70–99)
Potassium: 3.7 mmol/L (ref 3.5–5.1)
Sodium: 136 mmol/L (ref 135–145)

## 2015-03-26 LAB — GLUCOSE, CAPILLARY
GLUCOSE-CAPILLARY: 169 mg/dL — AB (ref 70–99)
GLUCOSE-CAPILLARY: 185 mg/dL — AB (ref 70–99)
GLUCOSE-CAPILLARY: 168 mg/dL — AB (ref 70–99)
Glucose-Capillary: 252 mg/dL — ABNORMAL HIGH (ref 70–99)

## 2015-03-26 LAB — EXPECTORATED SPUTUM ASSESSMENT W REFEX TO RESP CULTURE

## 2015-03-26 MED ORDER — AMLODIPINE BESYLATE 10 MG PO TABS
10.0000 mg | ORAL_TABLET | Freq: Every day | ORAL | Status: DC
  Administered 2015-03-27 – 2015-03-28 (×2): 10 mg via ORAL
  Filled 2015-03-26 (×2): qty 1

## 2015-03-26 MED ORDER — IPRATROPIUM BROMIDE 0.02 % IN SOLN
0.5000 mg | Freq: Four times a day (QID) | RESPIRATORY_TRACT | Status: DC
  Administered 2015-03-26 – 2015-03-27 (×6): 0.5 mg via RESPIRATORY_TRACT
  Filled 2015-03-26 (×6): qty 2.5

## 2015-03-26 MED ORDER — METOPROLOL TARTRATE 12.5 MG HALF TABLET
12.5000 mg | ORAL_TABLET | Freq: Two times a day (BID) | ORAL | Status: DC
  Administered 2015-03-26 – 2015-03-27 (×3): 12.5 mg via ORAL
  Filled 2015-03-26 (×4): qty 1

## 2015-03-26 MED ORDER — BUDESONIDE 0.25 MG/2ML IN SUSP
0.2500 mg | Freq: Two times a day (BID) | RESPIRATORY_TRACT | Status: DC
  Administered 2015-03-26 – 2015-03-27 (×2): 0.25 mg via RESPIRATORY_TRACT
  Filled 2015-03-26 (×5): qty 2

## 2015-03-26 MED ORDER — LEVALBUTEROL HCL 0.63 MG/3ML IN NEBU
0.6300 mg | INHALATION_SOLUTION | Freq: Four times a day (QID) | RESPIRATORY_TRACT | Status: DC
  Administered 2015-03-26 – 2015-03-27 (×6): 0.63 mg via RESPIRATORY_TRACT
  Filled 2015-03-26 (×10): qty 3

## 2015-03-26 MED ORDER — POTASSIUM CHLORIDE CRYS ER 20 MEQ PO TBCR
40.0000 meq | EXTENDED_RELEASE_TABLET | Freq: Once | ORAL | Status: AC
  Administered 2015-03-26: 40 meq via ORAL
  Filled 2015-03-26: qty 2

## 2015-03-26 MED ORDER — PANTOPRAZOLE SODIUM 40 MG PO TBEC
40.0000 mg | DELAYED_RELEASE_TABLET | Freq: Every day | ORAL | Status: DC
  Administered 2015-03-26: 40 mg via ORAL
  Filled 2015-03-26: qty 1

## 2015-03-26 MED ORDER — CEFTRIAXONE SODIUM IN DEXTROSE 20 MG/ML IV SOLN
1.0000 g | INTRAVENOUS | Status: DC
  Administered 2015-03-26 – 2015-03-27 (×2): 1 g via INTRAVENOUS
  Filled 2015-03-26 (×3): qty 50

## 2015-03-26 MED ORDER — INSULIN STARTER KIT- PEN NEEDLES (ENGLISH): 1.0000 | Freq: Once | Status: DC

## 2015-03-26 MED ORDER — FUROSEMIDE 10 MG/ML IJ SOLN
80.0000 mg | Freq: Once | INTRAMUSCULAR | Status: AC
  Administered 2015-03-26: 80 mg via INTRAVENOUS
  Filled 2015-03-26: qty 8

## 2015-03-26 NOTE — Plan of Care (Signed)
Problem: Food- and Nutrition-Related Knowledge Deficit (NB-1.1) Goal: Nutrition education Formal process to instruct or train a patient/client in a skill or to impart knowledge to help patients/clients voluntarily manage or modify food choices and eating behavior to maintain or improve health. Outcome: Completed/Met Date Met:  03/26/15  RD consulted for nutrition education regarding diabetes.     Lab Results  Component Value Date    HGBA1C 12.4* 03/25/2015    RD provided "Carbohydrate Counting for People with Diabetes" handout from the Academy of Nutrition and Dietetics. Discussed different food groups and their effects on blood sugar, emphasizing carbohydrate-containing foods. Provided list of carbohydrates and recommended serving sizes of common foods.  Discussed importance of controlled and consistent carbohydrate intake throughout the day. Recommended 4 servings of carbohydrates at each meal. Provided examples of ways to balance meals/snacks and encouraged intake of high-fiber, whole grain complex carbohydrates. Discussed diabetic friendly drink options. Teach back method used.  Expect good compliance.  Body mass index is 45.65 kg/(m^2). Pt meets criteria for morbid obesity based on current BMI.  Current diet order is heart healthy/carb mod, patient is consuming approximately 100% of meals at this time. Labs and medications reviewed. No further nutrition interventions warranted at this time. RD contact information provided. If additional nutrition issues arise, please re-consult RD.  Kallie Locks, MS, RD, LDN Pager # [phone] After hours/ weekend pager # [phone]

## 2015-03-26 NOTE — Progress Notes (Addendum)
Inpatient Diabetes Program Recommendations  AACE/ADA: New Consensus Statement on Inpatient Glycemic Control (2013)  Target Ranges:  Prepandial:   less than 140 mg/dL      Peak postprandial:   less than 180 mg/dL (1-2 hours)      Critically ill patients:  140 - 180 mg/dL   Noted patient with potential dx of PNA. Pt was having extreme difficulty breathing yesterday. Noted no steroids to be ordered due to elevated WBC and new onset DM.  However, Pt's cbg's can be controlled using an IV insulin drip as well as basal insulin and correction insulin. This infection potentially has caused the high elevation on admission. The lantus was helpful in that her fasting was improved this am. Unsure if insulin adm teaching is in order at this time. Will follow-Pt was unable to learn much of anything yesterday when attempted. Will need to notify [facility] to reschedule appt for follow-up for dm if not discharged today.  Thank you Rosita Kea, RN, MSN, CDE  Diabetes Inpatient Program Office: [phone] Pager: [phone] 8:00 am to 5:00 pm

## 2015-03-26 NOTE — Consult Note (Signed)
Name: Pamela Campbell MRN: [id number] DOB: 05/28/1961    ADMISSION DATE:  03/23/2015 CONSULTATION DATE: 4/5  REFERRING MD :  Cards  CHIEF COMPLAINT:  SOB  BRIEF PATIENT DESCRIPTION: Obese female  SIGNIFICANT EVENTS    STUDIES:  4/4 2 >>grade 2 dysfunction   HISTORY OF PRESENT ILLNESS:  Pamela Campbell is am obese(266 lb,BMI42.3) remote smoker(quit age 54) who reports coughing secondary to dust at work. No fevers, chills sweats or fever. He main complaint is increasing SOB over last 3 weeks. She does have some non productive cough. Denies any other exposures. She carries the diagnosis of OSA but has never had sleep study or treatment. Daughter describes OSA sxs perfectly. CxR has very light  markings on left and wbc is greater than 20K. PCCM is asked to evaluate. She has a troponin(0.18) and 2d reveals diastolic dysfunction grade 2.   PAST MEDICAL HISTORY :   has a past medical history of Hypertension.  has past surgical history that includes Fracture surgery; Tubal ligation; and Cesarean section. Prior to Admission medications   Medication Sig Start Date End Date Taking? Authorizing Provider  b complex vitamins tablet Take 1 tablet by mouth daily.   Yes Historical Provider, MD  Digestive Enzymes (ENZYME DIGEST) CAPS Take 1 capsule by mouth daily.   Yes Historical Provider, MD  Multiple Vitamin (MULTIVITAMIN WITH MINERALS) TABS tablet Take 1 tablet by mouth daily.   Yes Historical Provider, MD  naproxen sodium (ANAPROX) 220 MG tablet Take 220 mg by mouth 2 (two) times daily as needed (for knee pain).   Yes Historical Provider, MD  Probiotic Product (PROBIOTIC DAILY) CAPS Take 1 capsule by mouth daily.   Yes Historical Provider, MD  acetaminophen (TYLENOL) 325 MG tablet Take 2 tablets (650 mg total) by mouth every 4 (four) hours as needed for headache or mild pain. 03/25/15   Erlene Quan, PA-C  amLODipine (NORVASC) 5 MG tablet Take 1 tablet (5 mg total) by mouth daily. 03/25/15   Erlene Quan,  PA-C  aspirin EC 81 MG EC tablet Take 1 tablet (81 mg total) by mouth daily. 03/25/15   Erlene Quan, PA-C  atorvastatin (LIPITOR) 80 MG tablet Take 1 tablet (80 mg total) by mouth daily at 6 PM. 03/25/15   Erlene Quan, PA-C  azithromycin [facility]) 250 MG tablet One a day till done 03/26/15   Erlene Quan, PA-C  carvedilol (COREG) 12.5 MG tablet Take 1 tablet (12.5 mg total) by mouth 2 (two) times daily with a meal. 03/25/15   Erlene Quan, PA-C  HYDROcodone-acetaminophen (NORCO/VICODIN) 5-325 MG per tablet Take 1 tablet by mouth every 6 (six) hours as needed for pain. 05/09/13   Mancel Bale, PA-C  insulin glargine (LANTUS) 100 UNIT/ML injection Inject 0.15 mLs (15 Units total) into the skin every morning. 03/25/15   Erlene Quan, PA-C  lisinopril-hydrochlorothiazide (PRINZIDE,ZESTORETIC) 10-12.5 MG per tablet Take 1 tablet by mouth daily. Patient not taking: Reported on 03/23/2015 05/08/13   Mancel Bale, PA-C  nitroGLYCERIN (NITROSTAT) 0.4 MG SL tablet Place 1 tablet (0.4 mg total) under the tongue every 5 (five) minutes x 3 doses as needed for chest pain. 03/25/15   Erlene Quan, PA-C  traMADol (ULTRAM) 50 MG tablet Take 1 tablet (50 mg total) by mouth every 8 (eight) hours as needed for pain. 05/08/13   Mancel Bale, PA-C   Allergies  Allergen Reactions  . Vioxx [Rofecoxib] Other (See Comments)  Bleeding  . Compazine [Prochlorperazine Edisylate] Swelling    FAMILY HISTORY:  family history includes Heart disease in her father. SOCIAL HISTORY:  reports that she has never smoked. She does not have any smokeless tobacco history on file. She reports that she does not drink alcohol or use illicit drugs.  REVIEW OF SYSTEMS:   10 point review of system taken, please see HPI for positives and negatives.  SUBJECTIVE:   VITAL SIGNS: Temp:  [98.1 F (36.7 C)-99.7 F (37.6 C)] 99.7 F (37.6 C) (04/05 0356) Pulse Rate:  [76-80] 79 (04/05 0900) Resp:  [18-20] 20 (04/05 0356) BP:  (129-184)/(64-83) 184/82 mmHg (04/05 0900) SpO2:  [95 %] 95 % (04/05 0725) Weight:  [266 lb 1.5 oz (120.7 kg)] 266 lb 1.5 oz (120.7 kg) (04/05 0409)  PHYSICAL EXAMINATION: General:  Obese female nad at rest. Neuro:  intact HEENT:  No jvd/lan Cardiovascular:  HSR RRR Lungs:  Mild exp wheeze Abdomen:  Obese + bs Musculoskeletal:  INTACT Skin:  Warm and dry   Recent Labs Lab 03/24/15 0545 03/25/15 0357 03/26/15 0551  NA 138 135 136  K 3.8 4.0 3.7  CL 103 99 99  CO2 27 25 27   BUN 12 22 20   CREATININE 1.26* 1.68* 1.48*  GLUCOSE 286* 225* 171*    Recent Labs Lab 03/23/15 1945 03/24/15 0545 03/25/15 0357  HGB 13.2 12.0 11.6*  HCT 40.1 37.5 36.4  WBC 15.8* 13.4* 20.8*  PLT 322 286 287   No results found.    I reviewed CXR myself, noted above, LLL infiltrate, likely PNA.  ASSESSMENT    Suspected pneumonia     Sleep apnea    Acute on chronic diastolic congestive heart failure    Elevated troponin    Essential hypertension    Cough    Shortness of breath    Accelerated hypertension    Acute renal insufficiency    Acute URI    Obesity-BMI 42    Dyslipidemia  Discussion: Pamela Campbell is am obese(266 lb,BMI42.3) remote smoker(quit age 61) who reports coughing secondary to dust at work. No fevers, chills sweats or fever. He main complaint is increasing SOB over last 3 weeks. She does have some non productive cough. Denies any other exposures. She carries the diagnosis of OSA but has never had sleep study or treatment. Daughter describes OSA sxs perfectly. CxR has very light  markings on left and wbc is greater than 20K. PCCM is asked to evaluate. She has a troponin(0.18) and 2d reveals diastolic dysfunction grade 2.   PLAN:  Bronchodilators as needed. Would not add systemic steroids with her newly dx DM and active infection. Inhaled steroids. See orders for changes Follow c x r' s Outpatient sleep study with titrate for CPAP use. Will not place CPAP while in the  hospital. Diuresis will most likely help her the most specially now that renal function is improved.  Richardson Landry Minor ACNP Maryanna Shape PCCM Pager [phone] till 3 pm If no answer page [phone]  Above note edited in full.  No indication for and is contraindicated for steroids at this point given infection.  No evidence of wheezing on exam to justify it.  Inhaled steroids as ordered.  Bronchodilators as ordered.  I reviewed the CXR personally as placed above.  Will need a sleep study for OSA.  So active problems addressed by PCCM on this consult are new and worsening pneumonia (if spikes a fever will likely need broader spectrum abx), hypoxemia (likely related to  pulmonary edema), pulmonary edema (due to cardiac disease, diuretics adjusted) and obstructive sleep apnea (will not start CPAP, will need an outpatient sleep study).  Patient seen and examined, agree with above note.  I dictated the care and orders written for this patient under my direction.  Rush Farmer, MD [phone]  03/26/2015, 9:32 AM

## 2015-03-26 NOTE — Progress Notes (Addendum)
03/26/15 Noted pt probably d/c tomorrow. Will teach pt how to use the insulin pen today is able. Recommend lantus 15 units at d/c. Pt can then go to the clinic and OP pharmacy to get f/up care and meds.  Ad attempted to teach pt how to use insulin pen, however pt states she feels pretty bad and doesn't feel she can learn how to use yet. Also will order care management to reschedule her appt at [facility] scheduled for tomorrow am  Ad I called [place] to cancel appt for tomorrow am-told to call back to schedule the day before d/c.  Thank you Rosita Kea, RN, MSN, CDE  Diabetes Inpatient Program Office: [phone] Pager: [phone] 8:00 am to 5:00 pm

## 2015-03-26 NOTE — Progress Notes (Signed)
Subjective:  She is really no better than yesterday but wants to go home.  Objective:  Vital Signs in the last 24 hours: Temp:  [98.1 F (36.7 C)-99.7 F (37.6 C)] 99.7 F (37.6 C) (04/05 0356) Pulse Rate:  [76-80] 80 (04/05 0356) Resp:  [18-20] 20 (04/05 0356) BP: (129-167)/(64-83) 154/71 mmHg (04/05 0356) SpO2:  [95 %] 95 % (04/05 0725) Weight:  [266 lb 1.5 oz (120.7 kg)] 266 lb 1.5 oz (120.7 kg) (04/05 0409)  Intake/Output from previous day:  Intake/Output Summary (Last 24 hours) at 03/26/15 0803 Last data filed at 03/25/15 1800  Gross per 24 hour  Intake    840 ml  Output      0 ml  Net    840 ml    Physical Exam: General appearance: alert, cooperative, no distress and morbidly obese Lungs: wheezing bilat Heart: regular rate and rhythm Abdomen: obese   Rate: 84  Rhythm: normal sinus rhythm  Lab Results:  Recent Labs  03/24/15 0545 03/25/15 0357  WBC 13.4* 20.8*  HGB 12.0 11.6*  PLT 286 287    Recent Labs  03/25/15 0357 03/26/15 0551  NA 135 136  K 4.0 3.7  CL 99 99  CO2 25 27  GLUCOSE 225* 171*  BUN 22 20  CREATININE 1.68* 1.48*    Recent Labs  03/24/15 0545 03/25/15 0357  TROPONINI 0.10* 0.18*   No results for input(s): INR in the last 72 hours.  Imaging: Imaging results have been reviewed  Cardiac Studies: Echo 03/25/15 Study Conclusions  - Left ventricle: The cavity size was normal. There was moderate concentric hypertrophy. Systolic function was normal. The estimated ejection fraction was in the range of 50% to 55%. Wall motion was normal; there were no regional wall motion abnormalities. Features are consistent with a pseudonormal left ventricular filling pattern, with concomitant abnormal relaxation and increased filling pressure (grade 2 diastolic dysfunction). - Aortic valve: There was trivial regurgitation. - Mitral valve: There was mild regurgitation. - Left atrium: The atrium was mildly  dilated  Assessment/Plan:  54 y/o morbidly obese AA F with untreated HTN and new diagnosis of diabetes, sent from  from urgent care 03/23/15 with hypertensive emergency and dyspnea. Her initial systolic B/P was > [id number], BNP 170, Troponin pk 0.18. She was placed on IV Lasix but there has been no significant diuresis and her wgts appear inaccurate. A Z pack was added for suspected acute bronchitis. Yesterday we increased her HHN Rx and gave her 40 mg Lasix IV. This AM she remains SOB and wheezing. Her WBC is now 20K, she is afebrile.    Principal Problem:   Acute on chronic diastolic congestive heart failure Active Problems:   Accelerated hypertension   Elevated troponin   Essential hypertension   Acute renal insufficiency   Acute URI   Obesity-BMI 42   Sleep apnea   Dyslipidemia   PLAN: Initially I had considered adding steroids but am hesitant with her elevated WBC. Will ask pulmonary to see today. I ordered Lasix 80 mg this am and will decrease Lopressor top 12.5 mg BID with wheezing.   Kerin Ransom PA-C Beeper [id number] 03/26/2015, 8:03 AM   I have seen and examined the patient along with Kerin Ransom PA-C.  I have reviewed the chart, notes and new data.  I agree with PA's note.  Key new complaints: cough and wheezing are worse Key examination changes: more wheezing than yesterday Key new findings / data: increasing WBC, low grade  fever  PLAN: Broaden antibiotics. Sputum culture. Keep in hospital Metoprolol decreased. Increase amlodipine  Sanda Klein, MD, [facility] [place] [phone] 03/26/2015, 11:21 AM

## 2015-03-26 NOTE — Care Management Note (Addendum)
    Page 1 of 2   03/28/2015     3:58:05 PM CARE MANAGEMENT NOTE 03/28/2015  Patient:  Pamela Campbell, Pamela Campbell   Account Number:  [id number]  Date Initiated:  03/25/2015  Documentation initiated by:  [facility]  Subjective/Objective Assessment:   DM     Action/Plan:   Anticipated DC Date:  03/29/2015   Anticipated DC Plan:  [place]  CM consult  Medication Assistance  [place] Program      Choice offered to / List presented to:             Status of service:  Completed, signed off Medicare Important Message given?   (If response is "NO", the following Medicare IM given date fields will be blank) Date Medicare IM given:   Medicare IM given by:   Date Additional Medicare IM given:   Additional Medicare IM given by:    Discharge Disposition:  HOME/SELF CARE  Per UR Regulation:  Reviewed for med. necessity/level of care/duration of stay  If discussed at [place] of Stay Meetings, dates discussed:   03/28/2015    Comments:  03/28/15- 1200- Marvetta Gibbons RN, BSN [phone] Pt for d/c home today- has appointment made for Monday 4/11- at [facility]- pt will need assistance for meds at discharge and is eligible for [facility]- spoke with pt at bedside- confirmed appointment with pt and discussed medications and MATCH program- one time use for $3 per prescriptions for discharge medications not including any pain meds- pt voices understanding and appreciation for assistance- [facility] letter given to pt.  03/26/15- [place] RN, BSN [phone] Call made to [facility]- appointment for tomorrow 03/27/15- had already been canceled by someone- have rescheduled pt's appointment for 04/01/15-next Monday- for 9:30- (will monitor pt's readiness for d/c and can reschedule again if needed)  03/25/2015 1500 NCM spoke to pt and she is working but she feels she has respiratory issues from job. She only worked 3 weeks. Cataract appt arranged for 4/6 at 9:30 am. Explained to pt  she can pick up meds from [facility] pharmacy at discount. Pt can pick up a glucometer with Rx from the [facility]. Jonnie Finner RN CCM Case Mgmt phone [phone]

## 2015-03-27 ENCOUNTER — Inpatient Hospital Stay: Payer: Self-pay | Admitting: Family Medicine

## 2015-03-27 DIAGNOSIS — R062 Wheezing: Secondary | ICD-10-CM

## 2015-03-27 DIAGNOSIS — R9389 Abnormal findings on diagnostic imaging of other specified body structures: Secondary | ICD-10-CM | POA: Diagnosis not present

## 2015-03-27 DIAGNOSIS — I5033 Acute on chronic diastolic (congestive) heart failure: Secondary | ICD-10-CM

## 2015-03-27 DIAGNOSIS — R938 Abnormal findings on diagnostic imaging of other specified body structures: Secondary | ICD-10-CM

## 2015-03-27 DIAGNOSIS — E119 Type 2 diabetes mellitus without complications: Secondary | ICD-10-CM

## 2015-03-27 DIAGNOSIS — J189 Pneumonia, unspecified organism: Secondary | ICD-10-CM | POA: Clinically undetermined

## 2015-03-27 LAB — BASIC METABOLIC PANEL
Anion gap: 11 (ref 5–15)
BUN: 20 mg/dL (ref 6–23)
CO2: 28 mmol/L (ref 19–32)
Calcium: 8.3 mg/dL — ABNORMAL LOW (ref 8.4–10.5)
Chloride: 97 mmol/L (ref 96–112)
Creatinine, Ser: 1.48 mg/dL — ABNORMAL HIGH (ref 0.50–1.10)
GFR calc Af Amer: 46 mL/min — ABNORMAL LOW (ref >90)
GFR calc non Af Amer: 40 mL/min — ABNORMAL LOW (ref >90)
Glucose, Bld: 173 mg/dL — ABNORMAL HIGH (ref 70–99)
Potassium: 3.8 mmol/L (ref 3.5–5.1)
Sodium: 136 mmol/L (ref 135–145)

## 2015-03-27 LAB — CBC
HCT: 36.3 % (ref 36.0–46.0)
Hemoglobin: 11.6 g/dL — ABNORMAL LOW (ref 12.0–15.0)
MCH: 27.4 pg (ref 26.0–34.0)
MCHC: 32 g/dL (ref 30.0–36.0)
MCV: 85.6 fL (ref 78.0–100.0)
Platelets: 283 10*3/uL (ref 150–400)
RBC: 4.24 MIL/uL (ref 3.87–5.11)
RDW: 14.3 % (ref 11.5–15.5)
WBC: 13.6 10*3/uL — ABNORMAL HIGH (ref 4.0–10.5)

## 2015-03-27 LAB — GLUCOSE, CAPILLARY
GLUCOSE-CAPILLARY: 186 mg/dL — AB (ref 70–99)
GLUCOSE-CAPILLARY: 200 mg/dL — AB (ref 70–99)
Glucose-Capillary: 154 mg/dL — ABNORMAL HIGH (ref 70–99)
Glucose-Capillary: 163 mg/dL — ABNORMAL HIGH (ref 70–99)

## 2015-03-27 MED ORDER — BUDESONIDE 0.5 MG/2ML IN SUSP
0.5000 mg | Freq: Four times a day (QID) | RESPIRATORY_TRACT | Status: DC
  Administered 2015-03-27 (×2): 0.5 mg via RESPIRATORY_TRACT
  Filled 2015-03-27 (×4): qty 2

## 2015-03-27 MED ORDER — ENOXAPARIN SODIUM 60 MG/0.6ML ~~LOC~~ SOLN
60.0000 mg | SUBCUTANEOUS | Status: DC
  Administered 2015-03-27: 60 mg via SUBCUTANEOUS
  Filled 2015-03-27 (×2): qty 0.6

## 2015-03-27 MED ORDER — LOSARTAN POTASSIUM 25 MG PO TABS
25.0000 mg | ORAL_TABLET | Freq: Every day | ORAL | Status: DC
  Administered 2015-03-27 – 2015-03-28 (×2): 25 mg via ORAL
  Filled 2015-03-27 (×2): qty 1

## 2015-03-27 MED ORDER — PANTOPRAZOLE SODIUM 40 MG PO TBEC
40.0000 mg | DELAYED_RELEASE_TABLET | Freq: Two times a day (BID) | ORAL | Status: DC
  Administered 2015-03-27 – 2015-03-28 (×2): 40 mg via ORAL
  Filled 2015-03-27 (×2): qty 1

## 2015-03-27 MED ORDER — POTASSIUM CHLORIDE CRYS ER 20 MEQ PO TBCR
40.0000 meq | EXTENDED_RELEASE_TABLET | Freq: Once | ORAL | Status: AC
  Administered 2015-03-27: 40 meq via ORAL
  Filled 2015-03-27: qty 2

## 2015-03-27 MED ORDER — FUROSEMIDE 10 MG/ML IJ SOLN
80.0000 mg | Freq: Once | INTRAMUSCULAR | Status: AC
  Administered 2015-03-27: 80 mg via INTRAVENOUS
  Filled 2015-03-27: qty 8

## 2015-03-27 NOTE — Progress Notes (Signed)
Subjective:  She looks no better to me today- still SOB at rest  Objective:  Vital Signs in the last 24 hours: Temp:  [98.8 F (37.1 C)-100.1 F (37.8 C)] 98.8 F (37.1 C) (04/05 2158) Pulse Rate:  [75-83] 75 (04/06 0637) Resp:  [21-40] 21 (04/06 0637) BP: (147-184)/(77-93) 152/93 mmHg (04/06 0637) SpO2:  [95 %-97 %] 95 % (04/06 0800) Weight:  [262 lb 12.6 oz (119.2 kg)] 262 lb 12.6 oz (119.2 kg) (04/06 0637)  Intake/Output from previous day:  Intake/Output Summary (Last 24 hours) at 03/27/15 0814 Last data filed at 03/27/15 0400  Gross per 24 hour  Intake    480 ml  Output    900 ml  Net   -420 ml    Physical Exam: General appearance: alert, cooperative, mild distress and morbidly obese Lungs: bilateral wheezing Heart: regular rate and rhythm Abdomen: morbid obesity   Rate: 80  Rhythm: normal sinus rhythm  Lab Results:  Recent Labs  03/25/15 0357  WBC 20.8*  HGB 11.6*  PLT 287    Recent Labs  03/26/15 0551 03/27/15 0521  NA 136 136  K 3.7 3.8  CL 99 97  CO2 27 28  GLUCOSE 171* 173*  BUN 20 20  CREATININE 1.48* 1.48*    Recent Labs  03/25/15 0357  TROPONINI 0.18*   No results for input(s): INR in the last 72 hours.  Imaging: Dg Chest 2 View  03/26/2015   CLINICAL DATA:  Subsequent encounter for wheezing which began 2 days ago  EXAM: CHEST  2 VIEW  COMPARISON:  03/23/2015  FINDINGS: Mild cardiac enlargement and vascular congestion. Mild interstitial prominence although no definite edema. Areas of linear opacity left mid to lower lung zone not identified on the current study. Mild right hilar prominence new from prior study possibly related to patient rotation. Crowded markings in the medial right lung base suggest atelectasis.  Mild azygos vein distension.  IMPRESSION: Stable mild cardiac enlargement with increased but still mild vascular congestion and mild interstitial prominence without definite edema.  Mild opacity medial right lung base with  prominent right hilum. This could represent atelectasis that is related to more proximal mucous plugging. Pneumonia is considered to be less likely. Right hilar prominence may be somewhat exaggerated by rotation although perihilar infiltrate with adenopathy not excluded.  Radiographic follow-up recommended. Consider contrast-enhanced CT thorax to evaluate the right perihilar region optimally if indicated.   Electronically Signed   By: Skipper Cliche M.D.   On: 03/26/2015 13:48    Cardiac Studies:Echo 03/25/15 LV EF: 50% -  55%  ------------------------------------------------------------------- Study Conclusions  - Left ventricle: The cavity size was normal. There was moderate concentric hypertrophy. Systolic function was normal. The estimated ejection fraction was in the range of 50% to 55%. Wall motion was normal; there were no regional wall motion abnormalities. Features are consistent with a pseudonormal left ventricular filling pattern, with concomitant abnormal relaxation and increased filling pressure (grade 2 diastolic dysfunction). - Aortic valve: There was trivial regurgitation. - Mitral valve: There was mild regurgitation. - Left atrium: The atrium was mildly dilated.  Assessment/Plan:  54 y/o morbidly obese AA F with untreated HTN and new diagnosis of diabetes, sent from from urgent care 03/23/15 with hypertensive emergency and dyspnea. Her initial systolic B/P was > [id number], BNP 170, Troponin pk 0.18. She was placed on IV Lasix but there has been no significant diuresis and her wgts appear inaccurate. A Z pack was added for suspected acute  bronchitis. Yesterday we increased her HHN Rx and gave her 40 mg Lasix IV. This AM she remains SOB and wheezing. Her WBC is now 20K, she is afebrile. Seen by Pulmonary 03/26/15- Rocephin added to Zithromax.    Principal Problem:   Acute on chronic diastolic congestive heart failure Active Problems:   Accelerated hypertension    Pneumonia, organism unspecified   Elevated troponin   Essential hypertension   Acute renal insufficiency   Abnormal CXR   Type 2 diabetes mellitus   Obesity-BMI 42   Sleep apnea   Dyslipidemia   PLAN: Will give Lasix 80 mg again this am, temp 100, ABs may need to be adjusted- await pulmonary input.   Kerin Ransom PA-C Beeper [id number] 03/27/2015, 8:14 AM   I have seen and examined the patient along with Kerin Ransom PA-C.  I have reviewed the chart, notes and new data.  I agree with PA's note.  Key new complaints: still wheezing and coughing Key examination changes: bilateral wheezing Key new findings / data: repeat CXR with right hilar fullness, no overt CHF  PLAN: DC beta blocker Add ARB Continue ABX and bronchodilators  Sanda Klein, MD, [facility] [phone] 03/27/2015, 12:02 PM

## 2015-03-27 NOTE — Progress Notes (Addendum)
Name: Pamela Campbell MRN: [id number] DOB: 07-Jul-1961    ADMISSION DATE:  03/23/2015 CONSULTATION DATE: 4/5  REFERRING MD :  Cards  CHIEF COMPLAINT:  SOB  BRIEF PATIENT DESCRIPTION: Obese female  SIGNIFICANT EVENTS    STUDIES:  4/4 2 >>grade 2 dysfunction   HISTORY OF PRESENT ILLNESS:  Pamela Campbell is am obese(266 lb,BMI42.3) remote smoker(quit age 54) who reports coughing secondary to dust at work. No fevers, chills sweats or fever. He main complaint is increasing SOB over last 3 weeks. She does have some non productive cough. Denies any other exposures. She carries the diagnosis of OSA but has never had sleep study or treatment. Pamela Campbell describes OSA sxs perfectly. CxR has very light  markings on left and wbc is greater than 20K. PCCM is asked to evaluate. She has a troponin(0.18) and 2d reveals diastolic dysfunction grade 2.     SUBJECTIVE:  Cough worse  VITAL SIGNS: Temp:  [98.8 F (37.1 C)-100.1 F (37.8 C)] 98.8 F (37.1 C) (04/05 2158) Pulse Rate:  [75-83] 75 (04/06 0637) Resp:  [21-40] 21 (04/06 0637) BP: (147-184)/(77-93) 152/93 mmHg (04/06 0637) SpO2:  [95 %-97 %] 95 % (04/06 0800) Weight:  [262 lb 12.6 oz (119.2 kg)] 262 lb 12.6 oz (119.2 kg) (04/06 [id number])  PHYSICAL EXAMINATION: General:  Obese female nad at rest. Increased cough early AM ? GERD Neuro:  intact HEENT:  No jvd/lan Cardiovascular:  HSR RRR Lungs:  Increased exp wheeze Abdomen:  Obese + bs Musculoskeletal:  INTACT Skin:  Warm and dry   Recent Labs Lab 03/25/15 0357 03/26/15 0551 03/27/15 0521  NA 135 136 136  K 4.0 3.7 3.8  CL 99 99 97  CO2 25 27 28   BUN 22 20 20   CREATININE 1.68* 1.48* 1.48*  GLUCOSE 225* 171* 173*    Recent Labs Lab 03/23/15 1945 03/24/15 0545 03/25/15 0357  HGB 13.2 12.0 11.6*  HCT 40.1 37.5 36.4  WBC 15.8* 13.4* 20.8*  PLT 322 286 287   Dg Chest 2 View  03/26/2015   CLINICAL DATA:  Subsequent encounter for wheezing which began 2 days ago  EXAM: CHEST  2  VIEW  COMPARISON:  03/23/2015  FINDINGS: Mild cardiac enlargement and vascular congestion. Mild interstitial prominence although no definite edema. Areas of linear opacity left mid to lower lung zone not identified on the current study. Mild right hilar prominence new from prior study possibly related to patient rotation. Crowded markings in the medial right lung base suggest atelectasis.  Mild azygos vein distension.  IMPRESSION: Stable mild cardiac enlargement with increased but still mild vascular congestion and mild interstitial prominence without definite edema.  Mild opacity medial right lung base with prominent right hilum. This could represent atelectasis that is related to more proximal mucous plugging. Pneumonia is considered to be less likely. Right hilar prominence may be somewhat exaggerated by rotation although perihilar infiltrate with adenopathy not excluded.  Radiographic follow-up recommended. Consider contrast-enhanced CT thorax to evaluate the right perihilar region optimally if indicated.   Electronically Signed   By: Skipper Cliche M.D.   On: 03/26/2015 13:48      I reviewed CXR myself, noted above, LLL infiltrate, likely PNA.  ASSESSMENT     pneumonia     Sleep apnea    Acute on chronic diastolic congestive heart failure    Elevated troponin    Essential hypertension    Cough    ? GERD    Shortness of breath  Accelerated hypertension    Acute renal insufficiency    Acute URI    Obesity-BMI 42    Dyslipidemia  Discussion: Pamela Campbell is am obese(266 lb,BMI42.3) remote smoker(quit age 38) who reports coughing secondary to dust at work. No fevers, chills sweats or fever. He main complaint is increasing SOB over last 3 weeks. She does have some non productive cough. Denies any other exposures. She carries the diagnosis of OSA but has never had sleep study or treatment. Pamela Campbell describes OSA sxs perfectly. CxR has very light  markings on left and wbc is greater than 20K. PCCM  is asked to evaluate. She has a troponin(0.18) and 2d reveals diastolic dysfunction grade 2.   PLAN:  Bronchodilators as needed. Would not add systemic steroids with her newly dx DM and active infection. Inhaled steroids as ordered Follow c x r' s Outpatient sleep study with titrate for CPAP use. Will not place CPAP while in the hospital. Diuresis will most likely help her the most specially now that renal function is improved. Abx and if worsens expand. BID PPI for suspected GERD, consider GI work up in future. Repeat 2 v c x r on 4/7   [facility] ACNP Maryanna Shape PCCM Pager [phone] till 3 pm If no answer page [phone]  Significant wheezing on exam today, will double her GERD as I think patient is silently aspirating at night which is the reason for her lack of progress.  Will repeat CXR in AM and continue bronchodilator regiment.  Will also order and IS and flutter valve to assist with airway clearance.  Still see no reason for steroids at this time.  Patient seen and examined, agree with above note.  I dictated the care and orders written for this patient under my direction.  Rush Farmer, MD [phone]

## 2015-03-27 NOTE — Progress Notes (Signed)
Inpatient Diabetes Program Recommendations  AACE/ADA: New Consensus Statement on Inpatient Glycemic Control (2013)  Target Ranges:  Prepandial:   less than 140 mg/dL      Peak postprandial:   less than 180 mg/dL (1-2 hours)      Critically ill patients:  140 - 180 mg/dL   Pt will need improvement physically before insulin adm can be taught. Pt states she cannot do this at this time as she states she feels so bad.  Thank you Rosita Kea, RN, MSN, CDE  Diabetes Inpatient Program Office: [phone] Pager: [phone] 8:00 am to 5:00 pm

## 2015-03-27 NOTE — Progress Notes (Signed)
   03/27/15 1300  Clinical Encounter Type  Visited With Patient  Visit Type Initial  Referral From Nurse   Chaplain was referred to patient via spiritual care consult. Consult indicated patient would like to complete or update an advanced directive. Patient was already given advanced directive forms and stated that she did not have questions at this time about the document. Patient is waiting for her daughter to come back and visit with her so they can go through the documents together. Chaplain let patient know that he could be contacted again and help facilitate the signing of the document. Please call spiritual care department when patient is ready to have advanced directive completed.  Gwynne Kemnitz, Claudius Sis, Chaplain  1:38 PM

## 2015-03-28 ENCOUNTER — Inpatient Hospital Stay (HOSPITAL_COMMUNITY): Payer: Self-pay

## 2015-03-28 DIAGNOSIS — J189 Pneumonia, unspecified organism: Secondary | ICD-10-CM | POA: Insufficient documentation

## 2015-03-28 LAB — CBC
HCT: 37.8 % (ref 36.0–46.0)
Hemoglobin: 12.2 g/dL (ref 12.0–15.0)
MCH: 27.3 pg (ref 26.0–34.0)
MCHC: 32.3 g/dL (ref 30.0–36.0)
MCV: 84.6 fL (ref 78.0–100.0)
Platelets: 317 10*3/uL (ref 150–400)
RBC: 4.47 MIL/uL (ref 3.87–5.11)
RDW: 14 % (ref 11.5–15.5)
WBC: 12 10*3/uL — ABNORMAL HIGH (ref 4.0–10.5)

## 2015-03-28 LAB — CULTURE, RESPIRATORY W GRAM STAIN: Culture: NORMAL

## 2015-03-28 LAB — BASIC METABOLIC PANEL
Anion gap: 10 (ref 5–15)
BUN: 15 mg/dL (ref 6–23)
CO2: 26 mmol/L (ref 19–32)
Calcium: 8.5 mg/dL (ref 8.4–10.5)
Chloride: 102 mmol/L (ref 96–112)
Creatinine, Ser: 1.34 mg/dL — ABNORMAL HIGH (ref 0.50–1.10)
GFR calc Af Amer: 52 mL/min — ABNORMAL LOW (ref >90)
GFR calc non Af Amer: 45 mL/min — ABNORMAL LOW (ref >90)
Glucose, Bld: 163 mg/dL — ABNORMAL HIGH (ref 70–99)
Potassium: 4.2 mmol/L (ref 3.5–5.1)
Sodium: 138 mmol/L (ref 135–145)

## 2015-03-28 LAB — GLUCOSE, CAPILLARY
GLUCOSE-CAPILLARY: 147 mg/dL — AB (ref 70–99)
GLUCOSE-CAPILLARY: 183 mg/dL — AB (ref 70–99)

## 2015-03-28 LAB — CULTURE, RESPIRATORY

## 2015-03-28 MED ORDER — FUROSEMIDE 20 MG PO TABS: 20.0000 mg | ORAL_TABLET | Freq: Every day | ORAL | Status: DC | PRN

## 2015-03-28 MED ORDER — INSULIN GLARGINE 100 UNIT/ML ~~LOC~~ SOLN: 15.0000 [IU] | SUBCUTANEOUS | Status: DC

## 2015-03-28 MED ORDER — LOSARTAN POTASSIUM 50 MG PO TABS: 100.0000 mg | ORAL_TABLET | Freq: Every day | ORAL | Status: DC

## 2015-03-28 MED ORDER — AMLODIPINE BESYLATE 10 MG PO TABS: 10.0000 mg | ORAL_TABLET | Freq: Every day | ORAL | Status: DC

## 2015-03-28 MED ORDER — IPRATROPIUM BROMIDE 0.02 % IN SOLN: 0.5000 mg | Freq: Four times a day (QID) | RESPIRATORY_TRACT | Status: DC | PRN

## 2015-03-28 MED ORDER — LOSARTAN POTASSIUM 25 MG PO TABS
75.0000 mg | ORAL_TABLET | Freq: Once | ORAL | Status: AC
  Administered 2015-03-28: 75 mg via ORAL
  Filled 2015-03-28: qty 1

## 2015-03-28 MED ORDER — LEVALBUTEROL HCL 0.63 MG/3ML IN NEBU: 0.6300 mg | INHALATION_SOLUTION | Freq: Four times a day (QID) | RESPIRATORY_TRACT | Status: DC | PRN

## 2015-03-28 MED ORDER — LOSARTAN POTASSIUM 100 MG PO TABS: 100.0000 mg | ORAL_TABLET | Freq: Every day | ORAL | Status: DC

## 2015-03-28 MED ORDER — AMOXICILLIN-POT CLAVULANATE 875-125 MG PO TABS: 1.0000 | ORAL_TABLET | Freq: Two times a day (BID) | ORAL | Status: DC

## 2015-03-28 MED ORDER — ASPIRIN 81 MG PO TBEC: 81.0000 mg | DELAYED_RELEASE_TABLET | Freq: Every day | ORAL | Status: DC

## 2015-03-28 MED ORDER — ATORVASTATIN CALCIUM 80 MG PO TABS: 80.0000 mg | ORAL_TABLET | Freq: Every day | ORAL | Status: DC

## 2015-03-28 MED ORDER — PANTOPRAZOLE SODIUM 40 MG PO TBEC: 40.0000 mg | DELAYED_RELEASE_TABLET | Freq: Two times a day (BID) | ORAL | Status: DC

## 2015-03-28 MED ORDER — INSULIN STARTER KIT- PEN NEEDLES (ENGLISH)
1.0000 | Freq: Once | Status: AC
  Administered 2015-03-28: 1
  Filled 2015-03-28: qty 1

## 2015-03-28 MED ORDER — INSULIN GLARGINE 100 UNIT/ML SOLOSTAR PEN: 15.0000 [IU] | PEN_INJECTOR | Freq: Every day | SUBCUTANEOUS | Status: DC

## 2015-03-28 MED ORDER — LIVING WELL WITH DIABETES BOOK
Freq: Once | Status: AC
  Administered 2015-03-28: 10:00:00
  Filled 2015-03-28: qty 1

## 2015-03-28 MED ORDER — AMOXICILLIN-POT CLAVULANATE 875-125 MG PO TABS
1.0000 | ORAL_TABLET | Freq: Two times a day (BID) | ORAL | Status: DC
  Administered 2015-03-28: 1 via ORAL
  Filled 2015-03-28 (×2): qty 1

## 2015-03-28 NOTE — Discharge Summary (Signed)
Physician Discharge Summary      Cardiologist:  Katz(new) Patient ID: ANGLIA Pamela Campbell MRN: [id number] DOB/AGE: [birth year] 54 y.o.  Admit date: 03/23/2015 Discharge date: 03/28/2015  Admission Diagnoses:   Acute on chronic diastolic congestive heart failure  Discharge Diagnoses:  Principal Problem:   Acute on chronic diastolic congestive heart failure Active Problems:   Elevated troponin-demand ischemia   Essential hypertension   Accelerated hypertension   Acute renal insufficiency   Obesity-BMI 42   Sleep apnea   Dyslipidemia   Abnormal CXR   Type 2 diabetes mellitus   Pneumonia, organism unspecified   Discharged Condition: stable  Hospital Course:   [name] is a 3F with untreated HTN who presented from urgent care with hypertensive emergency and NSTEMI. She reported 2 weeks of SOB that was markedly worse the day of admission. She was ambulating from her car to a public bathroom and was unable to get there without stopping. People passing by stopped to ask if she was OK because her breathing was in extremis. She denies CP or palpitations, but she did become diaphoretic and felt like she was suffocating. She was afraid that she may die. She has gained 20lb in 3 weeks, notes abdominal distension and LE edema, though the edema improves each evening after she goes to sleep. She denies orthopnea or PND, though her daughter notes that she snores and frequently stops breathing overnight. She has not seen a doctor in years due to lack of insurance. She has known of her HTN diagnosis for years but has been unable to afford medications. She recently started a new job but still does not have insurance. In the ED Ms. [name] initial BP was 236/117 and troponin was positive. EKG revealed a prior infarct but no acute findings.   She was admitted for IV diuresis and started on Coreg 12.5bid.  IV NTG, ASQA 81, Heparin and lipitor also started.  Troponin increased to 0.18.  BP remained  high.  She was started on Azithromycin due to bronchitis, cough and low grade fever.  PCCM was asked to concult.  Abx was changed to IV rocephin with concerns for silent aspiration PNA.  She will be discharged on augmentin.  Echo revealed an EF of 50-55%, G2DD(see full report below).  She developed a significant amount of wheezing which resolved with discontinuation of Coreg.  She was changed to losartan which was later increased to 100mg  daily.   Case Mgr was asked to help with OP follow up and medication assistance.  She ultimately diuresed 2.8L and will be sent home on PRN lasix.  The patient was seen by Dr. Sallyanne Kuster who felt she was stable for DC home.   Needs outpatient sleep study.  Lipid panel in three months.    Consults: None  Significant Diagnostic Studies:  Echocardiogram, Study Conclusions  - Left ventricle: The cavity size was normal. There was moderate concentric hypertrophy. Systolic function was normal. The estimated ejection fraction was in the range of 50% to 55%. Wall motion was normal; there were no regional wall motion abnormalities. Features are consistent with a pseudonormal left ventricular filling pattern, with concomitant abnormal relaxation and increased filling pressure (grade 2 diastolic dysfunction). - Aortic valve: There was trivial regurgitation. - Mitral valve: There was mild regurgitation. - Left atrium: The atrium was mildly dilated.   Treatments: See above  Discharge Exam: Blood pressure 165/80, pulse 79, temperature 98.4 F (36.9 C), temperature source Oral, resp. rate 18, height 5\' 4"  (1.626 m), weight  259 lb 11.2 oz (117.8 kg), SpO2 95 %.   Disposition: 04-Intermediate Care Facility      Discharge Instructions    Diet - low sodium heart healthy    Complete by:  As directed      Discharge instructions    Complete by:  As directed   Monitor your weight every morning.  If you gain 3 pounds in 24 hours, or 5 pounds in a week, take  the lasix 20mg  daily until weight returns to baseline.  If it does not, call the our office for instructions([phone]).     Increase activity slowly    Complete by:  As directed             Medication List    STOP taking these medications        lisinopril-hydrochlorothiazide 10-12.5 MG per tablet  Commonly known as:  PRINZIDE,ZESTORETIC     naproxen sodium 220 MG tablet  Commonly known as:  ANAPROX      TAKE these medications        acetaminophen 325 MG tablet  Commonly known as:  TYLENOL  Take 2 tablets (650 mg total) by mouth every 4 (four) hours as needed for headache or mild pain.     amLODipine 10 MG tablet  Commonly known as:  NORVASC  Take 1 tablet (10 mg total) by mouth daily.     amoxicillin-clavulanate 875-125 MG per tablet  Commonly known as:  AUGMENTIN  Take 1 tablet by mouth every 12 (twelve) hours.     aspirin 81 MG EC tablet  Take 1 tablet (81 mg total) by mouth daily.     atorvastatin 80 MG tablet  Commonly known as:  LIPITOR  Take 1 tablet (80 mg total) by mouth daily at 6 PM.     b complex vitamins tablet  Take 1 tablet by mouth daily.     ENZYME DIGEST Caps  Take 1 capsule by mouth daily.     furosemide 20 MG tablet  Commonly known as:  LASIX  Take 1 tablet (20 mg total) by mouth daily as needed for fluid.     HYDROcodone-acetaminophen 5-325 MG per tablet  Commonly known as:  NORCO/VICODIN  Take 1 tablet by mouth every 6 (six) hours as needed for pain.     insulin glargine 100 UNIT/ML injection  Commonly known as:  LANTUS  Inject 0.15 mLs (15 Units total) into the skin every morning.     losartan 100 MG tablet  Commonly known as:  COZAAR  Take 1 tablet (100 mg total) by mouth daily.  Start taking on:  03/29/2015     multivitamin with minerals Tabs tablet  Take 1 tablet by mouth daily.     nitroGLYCERIN 0.4 MG SL tablet  Commonly known as:  NITROSTAT  Place 1 tablet (0.4 mg total) under the tongue every 5 (five) minutes x 3 doses as  needed for chest pain.     pantoprazole 40 MG tablet  Commonly known as:  PROTONIX  Take 1 tablet (40 mg total) by mouth 2 (two) times daily before a meal.     PROBIOTIC DAILY Caps  Take 1 capsule by mouth daily.     traMADol 50 MG tablet  Commonly known as:  ULTRAM  Take 1 tablet (50 mg total) by mouth every 8 (eight) hours as needed for pain.       Follow-up Information    Follow up with [facility]  On 04/01/2015.   Why:  appointment time 04/01/2015 at 9:30 am. bring $20 copay and discharge instructions.    Contact information:   [address] [place] [phone]      Follow up with Richardson Dopp, PA-C On 04/17/2015.   Specialty:  Physician Assistant   Why:  8:50 AM   Contact information:   [id number] [place] [address] [phone]      Greater than 30 minutes was spent completing the patient's discharge.   SignedTarri Fuller, [place] 03/28/2015, 12:30 PM

## 2015-03-28 NOTE — Progress Notes (Signed)
Patient discharged per orders. All d/c orders/instructions/follow up appointments/follow up care/medications/prescriptions discussed. Discussed the importance of daily weights with the patient, when to take a dose of lasix, when to call the doctor. We discussed signs and symptoms of fluid volume overload. Time allowed for questions and concerns. Patient's daughter to transport home. Roselyn Reef Trenese Haft,Rn

## 2015-03-28 NOTE — Progress Notes (Signed)
Name: Pamela Campbell MRN: [id number] DOB: December 30, 1960    ADMISSION DATE:  03/23/2015 CONSULTATION DATE: 4/5  REFERRING MD :  Cards  CHIEF COMPLAINT:  SOB  BRIEF PATIENT DESCRIPTION: Obese female  SIGNIFICANT EVENTS    STUDIES:  4/4 2 >>grade 2 dysfunction   HISTORY OF PRESENT ILLNESS:  Pamela Campbell is am obese(266 lb,BMI42.3) remote smoker(quit age 54) who reports coughing secondary to dust at work. No fevers, chills sweats or fever. He main complaint is increasing SOB over last 3 weeks. She does have some non productive cough. Denies any other exposures. She carries the diagnosis of OSA but has never had sleep study or treatment. Pamela Campbell describes OSA sxs perfectly. CxR has very light  markings on left and wbc is greater than 20K. PCCM is asked to evaluate. She has a troponin(0.18) and 2d reveals diastolic dysfunction grade 2.     SUBJECTIVE:  Feels better, no events overnight  VITAL SIGNS: Temp:  [98 F (36.7 C)-98.7 F (37.1 C)] 98.4 F (36.9 C) (04/07 0604) Pulse Rate:  [75-80] 79 (04/07 0604) Resp:  [18-19] 18 (04/07 0604) BP: (157-165)/(80-92) 165/80 mmHg (04/07 0604) SpO2:  [95 %-100 %] 95 % (04/07 0604) Weight:  [117.8 kg (259 lb 11.2 oz)] 117.8 kg (259 lb 11.2 oz) (04/07 0604)  PHYSICAL EXAMINATION: General:  Obese female nad at rest. Increased cough early AM ? GERD Neuro:  intact HEENT:  No jvd/lan Cardiovascular:  HSR RRR Lungs: Bibasilar crackles. Abdomen:  Obese + bs Musculoskeletal:  INTACT Skin:  Warm and dry   Recent Labs Lab 03/26/15 0551 03/27/15 0521 03/28/15 0506  NA 136 136 138  K 3.7 3.8 4.2  CL 99 97 102  CO2 27 28 26   BUN 20 20 15   CREATININE 1.48* 1.48* 1.34*  GLUCOSE 171* 173* 163*    Recent Labs Lab 03/25/15 0357 03/27/15 0905 03/28/15 0506  HGB 11.6* 11.6* 12.2  HCT 36.4 36.3 37.8  WBC 20.8* 13.6* 12.0*  PLT 287 283 317   Dg Chest 2 View  03/28/2015   CLINICAL DATA:  Pneumonia.  History of short of breath  EXAM: CHEST  2  VIEW  COMPARISON:  None.  FINDINGS: Mild airspace disease at the medial right base. Upper normal heart size. No pneumothorax. No pleural effusion. Left lung is clear. Postoperative changes at the left [facility] joint.  IMPRESSION: Right basilar airspace disease. Followup until resolution is recommended.   Electronically Signed   By: Marybelle Killings M.D.   On: 03/28/2015 08:07   Dg Chest 2 View  03/26/2015   CLINICAL DATA:  Subsequent encounter for wheezing which began 2 days ago  EXAM: CHEST  2 VIEW  COMPARISON:  03/23/2015  FINDINGS: Mild cardiac enlargement and vascular congestion. Mild interstitial prominence although no definite edema. Areas of linear opacity left mid to lower lung zone not identified on the current study. Mild right hilar prominence new from prior study possibly related to patient rotation. Crowded markings in the medial right lung base suggest atelectasis.  Mild azygos vein distension.  IMPRESSION: Stable mild cardiac enlargement with increased but still mild vascular congestion and mild interstitial prominence without definite edema.  Mild opacity medial right lung base with prominent right hilum. This could represent atelectasis that is related to more proximal mucous plugging. Pneumonia is considered to be less likely. Right hilar prominence may be somewhat exaggerated by rotation although perihilar infiltrate with adenopathy not excluded.  Radiographic follow-up recommended. Consider contrast-enhanced CT thorax to evaluate the  right perihilar region optimally if indicated.   Electronically Signed   By: Skipper Cliche M.D.   On: 03/26/2015 13:48      I reviewed CXR myself, noted above, LLL infiltrate, likely PNA.  ASSESSMENT     pneumonia     Sleep apnea    Acute on chronic diastolic congestive heart failure    Elevated troponin    Essential hypertension    Cough    ? GERD    Shortness of breath    Accelerated hypertension    Acute renal insufficiency    Acute URI    Obesity-BMI  42    Dyslipidemia  Discussion: Pamela Pamela Campbell is am obese(266 lb,BMI42.3) remote smoker(quit age 49) who reports coughing secondary to dust at work. No fevers, chills sweats or fever. He main complaint is increasing SOB over last 3 weeks. She does have some non productive cough. Denies any other exposures. She carries the diagnosis of OSA but has never had sleep study or treatment. Pamela Campbell describes OSA sxs perfectly. CxR has very light  markings on left and wbc is greater than 20K. PCCM is asked to evaluate. She has a troponin(0.18) and 2d reveals diastolic dysfunction grade 2.  She is currently feeling better and moving about the room off O2.  PLAN:  PRN albuterol. No steroids systemically. Inhaled steroids as ordered and would discharge as such. Outpatient sleep study with titrate for CPAP use. Diuresis will most likely help her the most specially now that renal function is improved. Continue agumentin for total abx days of 8 days. Continue PPI BID even after discharge.  Rush Farmer, M.D. [facility] Pulmonary/Critical Care Medicine. Pager: [phone]. After hours pager: [phone].

## 2015-03-28 NOTE — Progress Notes (Addendum)
Subjective: Breathing a lot better  Objective: Vital signs in last 24 hours: Temp:  [98 F (36.7 C)-98.7 F (37.1 C)] 98.4 F (36.9 C) (04/07 0604) Pulse Rate:  [71-80] 79 (04/07 0604) Resp:  [18-19] 18 (04/07 0604) BP: (157-165)/(80-92) 165/80 mmHg (04/07 0604) SpO2:  [94 %-100 %] 95 % (04/07 0604) Weight:  [259 lb 11.2 oz (117.8 kg)] 259 lb 11.2 oz (117.8 kg) (04/07 0604) Last BM Date: 03/26/15  Intake/Output from previous day: 04/06 0701 - 04/07 0700 In: 960 [P.O.:960] Out: 2650 [Urine:2650] Intake/Output this shift:    Medications Current Facility-Administered Medications  Medication Dose Route Frequency Provider Last Rate Last Dose  . acetaminophen (TYLENOL) tablet 650 mg  650 mg Oral Q4H PRN Skeet Latch, MD   650 mg at 03/27/15 0803  . amLODipine (NORVASC) tablet 10 mg  10 mg Oral Daily Annalyse Langlais, MD   10 mg at 03/27/15 1002  . aspirin EC tablet 81 mg  81 mg Oral Daily Skeet Latch, MD   81 mg at 03/27/15 1001  . atorvastatin (LIPITOR) tablet 80 mg  80 mg Oral q1800 Skeet Latch, MD   80 mg at 03/27/15 1624  . azithromycin (ZITHROMAX) tablet 250 mg  250 mg Oral Daily Carlena Bjornstad, MD   250 mg at 03/27/15 1002  . benzonatate (TESSALON) capsule 100 mg  100 mg Oral BID PRN Almyra Deforest, PA   100 mg at 03/26/15 1032  . cefTRIAXone (ROCEPHIN) 1 g in dextrose 5 % 50 mL IVPB - Premix  1 g Intravenous Q24H Qusai Kem, MD   1 g at 03/27/15 1217  . enoxaparin (LOVENOX) injection 60 mg  60 mg Subcutaneous Q24H Donalynn Furlong Damascus, RPH   60 mg at 03/27/15 2128  . insulin aspart (novoLOG) injection 0-15 Units  0-15 Units Subcutaneous TID WC Skeet Latch, MD   2 Units at 03/28/15 [phone]  . insulin aspart (novoLOG) injection 0-5 Units  0-5 Units Subcutaneous QHS Skeet Latch, MD   3 Units at 03/26/15 2155  . insulin glargine (LANTUS) injection 15 Units  15 Units Subcutaneous Daily Erlene Quan, PA-C   15 Units at 03/27/15 1002  . ipratropium (ATROVENT)  nebulizer solution 0.5 mg  0.5 mg Nebulization Q6H PRN Pixie Casino, MD      . levalbuterol [facility]) nebulizer solution 0.63 mg  0.63 mg Nebulization Q6H PRN Pixie Casino, MD      . losartan (COZAAR) tablet 25 mg  25 mg Oral Daily Josepha Barbier, MD   25 mg at 03/27/15 1217  . nitroGLYCERIN (NITROSTAT) SL tablet 0.4 mg  0.4 mg Sublingual Q5 Min x 3 PRN Skeet Latch, MD      . nitroGLYCERIN 50 mg in dextrose 5 % 250 mL (0.2 mg/mL) infusion  3-30 mcg/min Intravenous Titrated Skeet Latch, MD   Stopped at 03/25/15 0850  . ondansetron (ZOFRAN) injection 4 mg  4 mg Intravenous Q6H PRN Skeet Latch, MD      . pantoprazole (PROTONIX) EC tablet 40 mg  40 mg Oral BID AC William S Minor, NP   40 mg at 03/28/15 0553  . polyethylene glycol (MIRALAX / GLYCOLAX) packet 17 g  17 g Oral Daily Skeet Latch, MD   17 g at 03/26/15 1033    PE: General appearance: alert, cooperative and no distress Neck: no JVD Lungs: clear to auscultation bilaterally Heart: regular rate and rhythm, S1, S2 normal, no murmur, click, rub or gallop Abdomen: [place], +BS Extremities: Trace  LEE Pulses: 2+ and symmetric Skin: Warm and dry Neurologic: Grossly normal  Lab Results:   Recent Labs  03/27/15 0905 03/28/15 0506  WBC 13.6* 12.0*  HGB 11.6* 12.2  HCT 36.3 37.8  PLT 283 317   BMET  Recent Labs  03/26/15 0551 03/27/15 0521 03/28/15 0506  NA 136 136 138  K 3.7 3.8 4.2  CL 99 97 102  CO2 27 28 26   GLUCOSE 171* 173* 163*  BUN 20 20 15   CREATININE 1.48* 1.48* 1.34*  CALCIUM 8.3* 8.3* 8.5  Study Conclusions  - Left ventricle: The cavity size was normal. There was moderate concentric hypertrophy. Systolic function was normal. The estimated ejection fraction was in the range of 50% to 55%. Wall motion was normal; there were no regional wall motion abnormalities. Features are consistent with a pseudonormal left ventricular filling pattern, with concomitant abnormal  relaxation and increased filling pressure (grade 2 diastolic dysfunction). - Aortic valve: There was trivial regurgitation. - Mitral valve: There was mild regurgitation. - Left atrium: The atrium was mildly dilated.  Assessment/Plan  Principal Problem:   Acute on chronic diastolic congestive heart failure Net fluids: -1.7L/-2.8L.  SCr improving.  Seen by PCCM who thinks she is silently aspirating. PPI increased.  ABx as OP.  WBCs decreasing.  Afebrile.  CXR looks a little better today.   She looks ready for DC.  Will need to monitor weight daily.  Low sodium diet.  PRN lasix   Elevated troponin-demand ischemia   Essential hypertension  Poorly controlled.  Increase losartan.    Acute renal insufficiency   Obesity-BMI 42   Sleep apnea  Consider OP sleep study   Dyslipidemia   Abnormal CXR   Type 2 diabetes mellitus   Pneumonia, organism unspecified  Thought to be from aspiration.  Will continue augmentin as OP.     Needs case mgr help for medication assistance.  Thanks! She has no resources available.      LOS: 5 days    HAGER, BRYAN PA-C 03/28/2015 9:20 AM  I have seen and examined the patient along with HAGER, BRYAN PA-C.  I have reviewed the chart, notes and new data.  I agree with PA's note.  Key new complaints: feels much better Key examination changes: wheezing is gone Key new findings / data: improved creatinine Unsure if wheezing resolved after stopping beta blocker, changing antibiotic or treating the reflux. Suspect stopping beta blocker had biggest impact.  PLAN: DC home. Needs assistance with meds and access to affordable medical care. Plan another week of antibiotic and treat for silent aspiration as recommended. Also will need outpatient sleep study. Na restriction, daily weights discussed.  Sanda Klein, MD, [place] [phone] 03/28/2015, 11:19 AM

## 2015-03-28 NOTE — Progress Notes (Signed)
Performed diabetes teaching with patient. Patient has successfully administered her own insulin. Living with Diabetes Book given as well as the insulin pen teaching kit. Time spent discussing diabetes the disease, how to check her blood sugars, how to administer her insulin, etc. Discussed signs and symptoms of hyper/hypo glycemia as well as what to do in those situations. Discussed the impact of diet, exercise and illness on her blood sugars. Discussed the long term implications of uncontrolled diabetes. Time allowed for questions and concerns.  Will continue to monitor. Roselyn Reef Jessye Imhoff,RN

## 2015-03-28 NOTE — Progress Notes (Signed)
Spoke with RN today regarding patient. She states patient is feeling better and appears ready for insulin teaching.  I have asked the RN to have the patient begin giving herself her own insulin and for the staff to begin insulin teaching in preparation for discharge.  Living Well with Diabetes booklet and insulin pen teaching kit ordered.    Gentry Fitz, RN, BA, MHA, CDE Diabetes Coordinator Inpatient Diabetes Program  [phone] (Team Pager) [phone] Gershon Mussel [facility]) 03/28/2015 10:05 AM

## 2015-03-28 NOTE — Progress Notes (Signed)
Met with patient and her daughter before discharge.  Demonstrated the use of the insulin pen to the patient and gave her written instructions which included both pictorial and written instructions.  Patient was able to demonstrate successfully, the use of the pen.  I instructed her on site rotation, storage of insulin, the need to be consistent with the time of giving it and the physiology of the insulin.  I gave her a coupon for a pen of insulin that she can use at the pharmacy. I reviewed the need to check blood sugars twice a day, fasting and 2 hours after a meal and to bring the records with her when she sees the MD on the 11th. I have encouraged her to purchase a Reli-on meter to manage cost.  I reviewed the plate method of eating, and encouraged her to eat carbs, protein and low carbohydrate vegetables at each meal.  I have asked her to eliminate sweetened beverages including juice.  I have strongly encouraged her to take meds as ordered and to follow up with the [facility] as instructed.  Both patient and her daughter were receptive to the information, asked questions, and acknowledged they would  follow the recommendations.  Gentry Fitz, RN, BA, MHA, CDE Diabetes Coordinator Inpatient Diabetes Program  [phone] (Team Pager) [phone] Pamela Campbell [facility]) 03/28/2015 7:01 PM

## 2015-04-01 ENCOUNTER — Encounter: Payer: Self-pay | Admitting: Physician Assistant

## 2015-04-01 ENCOUNTER — Telehealth: Payer: Self-pay | Admitting: Internal Medicine

## 2015-04-01 ENCOUNTER — Encounter: Payer: Self-pay | Admitting: Family Medicine

## 2015-04-01 ENCOUNTER — Ambulatory Visit: Payer: Self-pay | Attending: Family Medicine | Admitting: Family Medicine

## 2015-04-01 VITALS — BP 152/90 | HR 73 | Temp 98.4°F | Resp 18 | Ht 64.0 in | Wt 260.0 lb

## 2015-04-01 DIAGNOSIS — E669 Obesity, unspecified: Secondary | ICD-10-CM | POA: Insufficient documentation

## 2015-04-01 DIAGNOSIS — Z794 Long term (current) use of insulin: Secondary | ICD-10-CM | POA: Insufficient documentation

## 2015-04-01 DIAGNOSIS — J189 Pneumonia, unspecified organism: Secondary | ICD-10-CM | POA: Insufficient documentation

## 2015-04-01 DIAGNOSIS — E118 Type 2 diabetes mellitus with unspecified complications: Secondary | ICD-10-CM

## 2015-04-01 DIAGNOSIS — I10 Essential (primary) hypertension: Secondary | ICD-10-CM | POA: Insufficient documentation

## 2015-04-01 DIAGNOSIS — I5033 Acute on chronic diastolic (congestive) heart failure: Secondary | ICD-10-CM | POA: Insufficient documentation

## 2015-04-01 DIAGNOSIS — Z87891 Personal history of nicotine dependence: Secondary | ICD-10-CM | POA: Insufficient documentation

## 2015-04-01 DIAGNOSIS — E119 Type 2 diabetes mellitus without complications: Secondary | ICD-10-CM | POA: Insufficient documentation

## 2015-04-01 LAB — BASIC METABOLIC PANEL
BUN: 24 mg/dL — ABNORMAL HIGH (ref 6–23)
CO2: 27 mEq/L (ref 19–32)
Calcium: 9 mg/dL (ref 8.4–10.5)
Chloride: 101 mEq/L (ref 96–112)
Creat: 1.47 mg/dL — ABNORMAL HIGH (ref 0.50–1.10)
Glucose, Bld: 214 mg/dL — ABNORMAL HIGH (ref 70–99)
POTASSIUM: 5.2 meq/L (ref 3.5–5.3)
SODIUM: 136 meq/L (ref 135–145)

## 2015-04-01 LAB — GLUCOSE, POCT (MANUAL RESULT ENTRY): POC GLUCOSE: 217 mg/dL — AB (ref 70–99)

## 2015-04-01 MED ORDER — INSULIN GLARGINE 100 UNIT/ML SOLOSTAR PEN: 20.0000 [IU] | PEN_INJECTOR | Freq: Every day | SUBCUTANEOUS | Status: DC

## 2015-04-01 MED ORDER — METOPROLOL TARTRATE 25 MG PO TABS: 25.0000 mg | ORAL_TABLET | Freq: Two times a day (BID) | ORAL | Status: DC

## 2015-04-01 NOTE — Telephone Encounter (Signed)
Patient will be seeing Dr. Ron Parker per hospital discharge summary & has follow up with Richardson Dopp

## 2015-04-01 NOTE — Telephone Encounter (Signed)
Spoke to patient regarding a return to work note. She was In-Patient 4/4-4/7 with CHF. Patient needs RTW status note, however she forgot to discuss this with Tarri Fuller, PA, at discharge.  Message sent to Tarri Fuller, [place]. Informed patient that we would call her after we hear from him. She was seen by [facility] and [facility] today in appt, however they were only willing to write that she was seen there today. Will await follow up from Tarri Fuller, [place].

## 2015-04-01 NOTE — Telephone Encounter (Signed)
New message    patient need a note for work - recently in hospital on April  2 . Discharge on  April  7. Please advise.

## 2015-04-01 NOTE — Progress Notes (Signed)
Hospitalized 4/2-4/7 for shortness of breath. Patient diagnose with pneumonia, congestive heart failure, and diabetes. Patient glucose currently 217, Patient has not had insulin today. Patient reports no pain currently.

## 2015-04-01 NOTE — Progress Notes (Signed)
Subjective:    Patient ID: Pamela Campbell, female    DOB: 05/12/1961, 54 y.o.   MRN: [id number]  HPI  Pamela Campbell was admitted at [facility] between 03/23/15 and 03/28/15 after she had presented with shortness of breath, was found to be with hypertensive emergency with a blood pressure of 236/117 and she had reported not being under the care of any doctor for a while. She had troponins which were elevated at 0.08, 0.10, 0.100.18. EKG revealed nonspecific T-wave abnormalities. She had a BNP of 170.6.  She was admitted and commenced on IV Lasix, nitroglycerin, aspirin, heparin and Lipitor. The 2-D echo revealed an EF of 50-55% and she was seen by cardiology and elevation in troponins was attributed to demand ischemia. Commenced on Coreg which was later discontinued when the patient developed wheezing. She received IV Rocephin and azithromycin as chest x-ray revealed early pneumonia: When her condition stabilized she was discharged on by mouth Levaquin.  Interval history: Overall she feels much better but does have some residual congestion nasally. She brings in her blood sugar log which reveals fasting blood sugar in the range of 175-234. She has been compliant with her Lantus. Scheduled to see the cardiologist on 04/17/15.  Past Medical History  Diagnosis Date  . Hypertension   . Diabetes mellitus without complication   . CHF (congestive heart failure)      Past Surgical History  Procedure Laterality Date  . Fracture surgery    . Tubal ligation    . Cesarean section       History   Social History  . Marital Status: Married    Spouse Name: N/A  . Number of Children: N/A  . Years of Education: N/A   Occupational History  . Not on file.   Social History Main Topics  . Smoking status: Former Smoker    Quit date: 03/22/1995  . Smokeless tobacco: Not on file  . Alcohol Use: No  . Drug Use: No  . Sexual Activity: No   Other Topics Concern  . Not on file   Social  History Narrative     Allergies  Allergen Reactions  . Vioxx [Rofecoxib] Other (See Comments)    Bleeding  . Compazine [Prochlorperazine Edisylate] Swelling     Current Outpatient Prescriptions on File Prior to Visit  Medication Sig Dispense Refill  . acetaminophen (TYLENOL) 325 MG tablet Take 2 tablets (650 mg total) by mouth every 4 (four) hours as needed for headache or mild pain.    Marland Kitchen amLODipine (NORVASC) 10 MG tablet Take 1 tablet (10 mg total) by mouth daily. 30 tablet 11  . amoxicillin-clavulanate (AUGMENTIN) 875-125 MG per tablet Take 1 tablet by mouth every 12 (twelve) hours. 11 tablet 0  . aspirin 81 MG EC tablet Take 1 tablet (81 mg total) by mouth daily. 30 tablet   . furosemide (LASIX) 20 MG tablet Take 1 tablet (20 mg total) by mouth daily as needed for fluid. 30 tablet 5  . losartan (COZAAR) 100 MG tablet Take 1 tablet (100 mg total) by mouth daily. 30 tablet 11  . Multiple Vitamin (MULTIVITAMIN WITH MINERALS) TABS tablet Take 1 tablet by mouth daily.    . nitroGLYCERIN (NITROSTAT) 0.4 MG SL tablet Place 1 tablet (0.4 mg total) under the tongue every 5 (five) minutes x 3 doses as needed for chest pain. 25 tablet 2  . atorvastatin (LIPITOR) 80 MG tablet Take 1 tablet (80 mg total) by mouth daily at 6  PM. (Patient not taking: Reported on 04/01/2015) 30 tablet 11  . b complex vitamins tablet Take 1 tablet by mouth daily.    . Digestive Enzymes (ENZYME DIGEST) CAPS Take 1 capsule by mouth daily.    Marland Kitchen HYDROcodone-acetaminophen (NORCO/VICODIN) 5-325 MG per tablet Take 1 tablet by mouth every 6 (six) hours as needed for pain. (Patient not taking: Reported on 04/01/2015) 20 tablet 0  . pantoprazole (PROTONIX) 40 MG tablet Take 1 tablet (40 mg total) by mouth 2 (two) times daily before a meal. (Patient not taking: Reported on 04/01/2015) 30 tablet 5  . Probiotic Product (PROBIOTIC DAILY) CAPS Take 1 capsule by mouth daily.    . traMADol (ULTRAM) 50 MG tablet Take 1 tablet (50 mg total)  by mouth every 8 (eight) hours as needed for pain. (Patient not taking: Reported on 04/01/2015) 30 tablet 0   No current facility-administered medications on file prior to visit.     Review of Systems  General: negative for fever, weight loss, appetite change Eyes: no visual symptoms. ENT: no ear symptoms, no sinus tenderness, admits to nasal congestion, no sore throat. Neck: no pain  Respiratory: no wheezing, shortness of breath, cough Cardiovascular: no chest pain, no dyspnea on exertion, no pedal edema, no orthopnea. Gastrointestinal: no abdominal pain, no diarrhea, no constipation Genito-Urinary: no urinary frequency, no dysuria, no polyuria. Hematologic: no bruising Endocrine: no cold or heat intolerance Neurological: no headaches, no seizures, no tremors Musculoskeletal: no joint pains, no joint swelling Skin: no pruritus, no rash. Psychological: no depression, no anxiety,       Objective:  Filed Vitals:   04/01/15 0930  BP: 152/90  Pulse: 73  Temp: 98.4 F (36.9 C)  Resp: 18      Physical Exam  Constitutional: obese Eyes: PERRLA HENT: Head is atraumatic, normal sinuses, normal oropharynx, normal appearing tonsils and palate Neck: normal range of motion, no thyromegaly, no JVD cardiovascular: normal rate and rhythm, normal heart sounds, no murmurs, rub or gallop, no pedal edema Respiratory: clear to auscultation bilaterally, no wheezes, no rales, no rhonchi Abdomen: soft, not tender to palpation, normal bowel sounds, no enlarged organs Extremities: Full ROM, no tenderness in joints Skin: warm and dry, no lesions. Neurological: alert, oriented x3, cranial nerves I-XII grossly intact Psychological: normal mood.         Assessment & Plan:  54 year old female patient recently hospitalized for community-acquired pneumonia and acute on chronic diastolic heart failure and newly diagnosed diabetes mellitus.  Acute on chronic diastolic heart failure: Not in acute  failure at this time; taking Lasix as needed. I am sending off a basic metabolic panel to evaluate her renal function as she is at risk of elevated creatinine due to her Lasix use.  Community-acquired pneumonia: Patient is doing well symptoms otherwise.  Diabetes mellitus: A1c of 12.4, diabetes is uncontrolled. Due to fasting sugars which persist in the upper 200s and raising her Lantus to 20 units at bedtime Blood sugar level be reviewed at the next office visit. She would also need to have her diabetic foot exam, microalbumin and ophthalmology evaluations.  Hypertension: Uncontrolled, I am adding metoprolol tartrate her regimen and she would need an evaluation of her blood pressure at her next office visit.   Obesity: Advised to cut back on portion sizes and increase physical activity.

## 2015-04-01 NOTE — Patient Instructions (Signed)
Diabetes Mellitus and Food It is important for you to manage your blood sugar (glucose) level. Your blood glucose level can be greatly affected by what you eat. Eating healthier foods in the appropriate amounts throughout the day at about the same time each day will help you control your blood glucose level. It can also help slow or prevent worsening of your diabetes mellitus. Healthy eating may even help you improve the level of your blood pressure and reach or maintain a healthy weight.  HOW CAN FOOD AFFECT ME? Carbohydrates Carbohydrates affect your blood glucose level more than any other type of food. Your dietitian will help you determine how many carbohydrates to eat at each meal and teach you how to count carbohydrates. Counting carbohydrates is important to keep your blood glucose at a healthy level, especially if you are using insulin or taking certain medicines for diabetes mellitus. Alcohol Alcohol can cause sudden decreases in blood glucose (hypoglycemia), especially if you use insulin or take certain medicines for diabetes mellitus. Hypoglycemia can be a life-threatening condition. Symptoms of hypoglycemia (sleepiness, dizziness, and disorientation) are similar to symptoms of having too much alcohol.  If your health care provider has given you approval to drink alcohol, do so in moderation and use the following guidelines:  Women should not have more than one drink per day, and men should not have more than two drinks per day. One drink is equal to:  12 oz of beer.  5 oz of wine.  1 oz of hard liquor.  Do not drink on an empty stomach.  Keep yourself hydrated. Have water, diet soda, or unsweetened iced tea.  Regular soda, juice, and other mixers might contain a lot of carbohydrates and should be counted. WHAT FOODS ARE NOT RECOMMENDED? As you make food choices, it is important to remember that all foods are not the same. Some foods have fewer nutrients per serving than other  foods, even though they might have the same number of calories or carbohydrates. It is difficult to get your body what it needs when you eat foods with fewer nutrients. Examples of foods that you should avoid that are high in calories and carbohydrates but low in nutrients include:  Trans fats (most processed foods list trans fats on the Nutrition Facts label).  Regular soda.  Juice.  Candy.  Sweets, such as cake, pie, doughnuts, and cookies.  Fried foods. WHAT FOODS CAN I EAT? Have nutrient-rich foods, which will nourish your body and keep you healthy. The food you should eat also will depend on several factors, including:  The calories you need.  The medicines you take.  Your weight.  Your blood glucose level.  Your blood pressure level.  Your cholesterol level. You also should eat a variety of foods, including:  Protein, such as meat, poultry, fish, tofu, nuts, and seeds (lean animal proteins are best).  Fruits.  Vegetables.  Dairy products, such as milk, cheese, and yogurt (low fat is best).  Breads, grains, pasta, cereal, rice, and beans.  Fats such as olive oil, trans fat-free margarine, canola oil, avocado, and olives. DOES EVERYONE WITH DIABETES MELLITUS HAVE THE SAME MEAL PLAN? Because every person with diabetes mellitus is different, there is not one meal plan that works for everyone. It is very important that you meet with a dietitian who will help you create a meal plan that is just right for you. Document Released: 09/03/2005 Document Revised: 12/12/2013 Document Reviewed: 11/03/2013 [employer] Patient Information 2015 [employer]. This   information is not intended to replace advice given to you by your health care provider. Make sure you discuss any questions you have with your health care provider. Hypertension Hypertension, commonly called high blood pressure, is when the force of blood pumping through your arteries is too strong. Your arteries are the  blood vessels that carry blood from your heart throughout your body. A blood pressure reading consists of a higher number over a lower number, such as 110/72. The higher number (systolic) is the pressure inside your arteries when your heart pumps. The lower number (diastolic) is the pressure inside your arteries when your heart relaxes. Ideally you want your blood pressure below 120/80. Hypertension forces your heart to work harder to pump blood. Your arteries may become narrow or stiff. Having hypertension puts you at risk for heart disease, stroke, and other problems.  RISK FACTORS Some risk factors for high blood pressure are controllable. Others are not.  Risk factors you cannot control include:   Race. You may be at higher risk if you are African American.  Age. Risk increases with age.  Gender. Men are at higher risk than women before age 46 years. After age 72, women are at higher risk than men. Risk factors you can control include:  Not getting enough exercise or physical activity.  Being overweight.  Getting too much fat, sugar, calories, or salt in your diet.  Drinking too much alcohol. SIGNS AND SYMPTOMS Hypertension does not usually cause signs or symptoms. Extremely high blood pressure (hypertensive crisis) may cause headache, anxiety, shortness of breath, and nosebleed. DIAGNOSIS  To check if you have hypertension, your health care provider will measure your blood pressure while you are seated, with your arm held at the level of your heart. It should be measured at least twice using the same arm. Certain conditions can cause a difference in blood pressure between your right and left arms. A blood pressure reading that is higher than normal on one occasion does not mean that you need treatment. If one blood pressure reading is high, ask your health care provider about having it checked again. TREATMENT  Treating high blood pressure includes making lifestyle changes and possibly  taking medicine. Living a healthy lifestyle can help lower high blood pressure. You may need to change some of your habits. Lifestyle changes may include:  Following the DASH diet. This diet is high in fruits, vegetables, and whole grains. It is low in salt, red meat, and added sugars.  Getting at least 2 hours of brisk physical activity every week.  Losing weight if necessary.  Not smoking.  Limiting alcoholic beverages.  Learning ways to reduce stress. If lifestyle changes are not enough to get your blood pressure under control, your health care provider may prescribe medicine. You may need to take more than one. Work closely with your health care provider to understand the risks and benefits. HOME CARE INSTRUCTIONS  Have your blood pressure rechecked as directed by your health care provider.   Take medicines only as directed by your health care provider. Follow the directions carefully. Blood pressure medicines must be taken as prescribed. The medicine does not work as well when you skip doses. Skipping doses also puts you at risk for problems.   Do not smoke.   Monitor your blood pressure at home as directed by your health care provider. SEEK MEDICAL CARE IF:   You think you are having a reaction to medicines taken.  You have recurrent headaches or  feel dizzy.  You have swelling in your ankles.  You have trouble with your vision. SEEK IMMEDIATE MEDICAL CARE IF:  You develop a severe headache or confusion.  You have unusual weakness, numbness, or feel faint.  You have severe chest or abdominal pain.  You vomit repeatedly.  You have trouble breathing. MAKE SURE YOU:   Understand these instructions.  Will watch your condition.  Will get help right away if you are not doing well or get worse. Document Released: 12/07/2005 Document Revised: 04/23/2014 Document Reviewed: 09/29/2013 [facility] Patient Information 2015 [place], [place]. This information is not  intended to replace advice given to you by your health care provider. Make sure you discuss any questions you have with your health care provider.

## 2015-04-01 NOTE — Telephone Encounter (Signed)
Spoke to patient to let her know that Tarri Fuller, PA, dictated a RTW note for her on hospital letterhead. She is coming to pick it up from the front desk this afternoon.

## 2015-04-02 LAB — CBC WITH DIFFERENTIAL/PLATELET
BASOS ABS: 0 10*3/uL (ref 0.0–0.1)
BASOS PCT: 0 % (ref 0–1)
Eosinophils Absolute: 0.3 10*3/uL (ref 0.0–0.7)
Eosinophils Relative: 2 % (ref 0–5)
HCT: 38.4 % (ref 36.0–46.0)
Hemoglobin: 12.5 g/dL (ref 12.0–15.0)
Lymphocytes Relative: 22 % (ref 12–46)
Lymphs Abs: 3.1 10*3/uL (ref 0.7–4.0)
MCH: 27.1 pg (ref 26.0–34.0)
MCHC: 32.6 g/dL (ref 30.0–36.0)
MCV: 83.3 fL (ref 78.0–100.0)
MONOS PCT: 10 % (ref 3–12)
MPV: 9.9 fL (ref 8.6–12.4)
Monocytes Absolute: 1.4 10*3/uL — ABNORMAL HIGH (ref 0.1–1.0)
NEUTROS PCT: 66 % (ref 43–77)
Neutro Abs: 9.3 10*3/uL — ABNORMAL HIGH (ref 1.7–7.7)
PLATELETS: 418 10*3/uL — AB (ref 150–400)
RBC: 4.61 MIL/uL (ref 3.87–5.11)
RDW: 14.2 % (ref 11.5–15.5)
WBC: 14.1 10*3/uL — ABNORMAL HIGH (ref 4.0–10.5)

## 2015-04-04 ENCOUNTER — Telehealth: Payer: Self-pay

## 2015-04-04 NOTE — Telephone Encounter (Signed)
-----   Message from Arnoldo Morale, MD sent at 04/02/2015  8:36 AM EDT ----- Her creatinine has trended up a little bit compared to previous and this could be due to her Lasix which I would like her to use only as needed and not on a continuous basis., White blood cells of also increased a bit and this could be steroid induced we will keep an eye on it.

## 2015-04-04 NOTE — Telephone Encounter (Signed)
Nurse called patient to inform of labs. Reached voicemail. Left message with nurses name and return phone number only.

## 2015-04-04 NOTE — Telephone Encounter (Signed)
Nurse called patient, Patient verified date of birth. Nurse explained to patient creatinine has increased a little compared to previous labs, this could be due to lasix, and only use Lasix on an as needed basis. Patient confirmed she was already using lasix on an as needed basis and not continuously. Nurse explained WBCs are also elevated and could be related to steroid use. Nurse explained to patient doctor would keep an eye on labs. Patient voiced understanding.

## 2015-04-15 ENCOUNTER — Ambulatory Visit: Payer: Self-pay | Admitting: Internal Medicine

## 2015-04-16 NOTE — Progress Notes (Signed)
Cardiology Office Note   Date:  04/16/2015   ID:  Pamela Campbell, DOB [birth year], MRN [id number]  PCP:  Default, Provider, MD  Cardiologist:  Dr. Cleatis Polka     Chief Complaint  Patient presents with  . Congestive Heart Failure     History of Present Illness: Pamela Campbell is a 54 y.o. female with a hx of untreated hypertension, obesity. Pamela Campbell was admitted 4/2-4/7 with acute diastolic CHF in the setting of hypertensive emergency. Troponins were elevated in a pattern consistent with demand ischemia. Pamela Campbell was also noted to have acute bronchitis. Pamela Campbell was seen by pulmonary and treated with antibiotics. The patient had significant increase in wheezing with beta blocker therapy. This was discontinued. Echocardiogram confirmed normal LV function. Patient was noted to have symptoms of sleep apnea and outpatient sleep study was felt to be necessary. Pamela Campbell was placed on statin therapy for treatment of hyperlipidemia. No ischemic workup was felt to be necessary.  Pamela Campbell returns for FU.    Studies/Reports Reviewed Today:  Echo 03/25/15 - Mod concentric hypertrophy. EF 50% to 55%. Wall motion was normal.  Grade 2 diastolic dysfunction). - Aortic valve: There was trivial regurgitation. - Mitral valve: There was mild regurgitation. - Left atrium: The atrium was mildly dilated.   Past Medical History  Diagnosis Date  . Hypertension   . Diabetes mellitus without complication   . CHF (congestive heart failure)     Past Surgical History  Procedure Laterality Date  . Fracture surgery    . Tubal ligation    . Cesarean section       Current Outpatient Prescriptions  Medication Sig Dispense Refill  . acetaminophen (TYLENOL) 325 MG tablet Take 2 tablets (650 mg total) by mouth every 4 (four) hours as needed for headache or mild pain.    Marland Kitchen amLODipine (NORVASC) 10 MG tablet Take 1 tablet (10 mg total) by mouth daily. 30 tablet 11  . amoxicillin-clavulanate (AUGMENTIN) 875-125 MG per tablet Take 1  tablet by mouth every 12 (twelve) hours. 11 tablet 0  . aspirin 81 MG EC tablet Take 1 tablet (81 mg total) by mouth daily. 30 tablet   . atorvastatin (LIPITOR) 80 MG tablet Take 1 tablet (80 mg total) by mouth daily at 6 PM. (Patient not taking: Reported on 04/01/2015) 30 tablet 11  . b complex vitamins tablet Take 1 tablet by mouth daily.    . Digestive Enzymes (ENZYME DIGEST) CAPS Take 1 capsule by mouth daily.    . furosemide (LASIX) 20 MG tablet Take 1 tablet (20 mg total) by mouth daily as needed for fluid. 30 tablet 5  . HYDROcodone-acetaminophen (NORCO/VICODIN) 5-325 MG per tablet Take 1 tablet by mouth every 6 (six) hours as needed for pain. (Patient not taking: Reported on 04/01/2015) 20 tablet 0  . Insulin Glargine (LANTUS SOLOSTAR) 100 UNIT/ML Solostar Pen Inject 20 Units into the skin daily after breakfast. 15 mL 2  . losartan (COZAAR) 100 MG tablet Take 1 tablet (100 mg total) by mouth daily. 30 tablet 11  . metoprolol tartrate (LOPRESSOR) 25 MG tablet Take 1 tablet (25 mg total) by mouth 2 (two) times daily. 60 tablet 2  . Multiple Vitamin (MULTIVITAMIN WITH MINERALS) TABS tablet Take 1 tablet by mouth daily.    . nitroGLYCERIN (NITROSTAT) 0.4 MG SL tablet Place 1 tablet (0.4 mg total) under the tongue every 5 (five) minutes x 3 doses as needed for chest pain. 25 tablet 2  . pantoprazole (PROTONIX) 40  MG tablet Take 1 tablet (40 mg total) by mouth 2 (two) times daily before a meal. (Patient not taking: Reported on 04/01/2015) 30 tablet 5  . Probiotic Product (PROBIOTIC DAILY) CAPS Take 1 capsule by mouth daily.    . traMADol (ULTRAM) 50 MG tablet Take 1 tablet (50 mg total) by mouth every 8 (eight) hours as needed for pain. (Patient not taking: Reported on 04/01/2015) 30 tablet 0   No current facility-administered medications for this visit.    Allergies:   Vioxx and Compazine    Social History:  The patient  reports that Pamela Campbell quit smoking about 20 years ago. Pamela Campbell does not have any  smokeless tobacco history on file. Pamela Campbell reports that Pamela Campbell does not drink alcohol or use illicit drugs.   Family History:  The patient's family history includes Heart disease in her father.    ROS:   Please see the history of present illness.   ROS    PHYSICAL EXAM: VS:  There were no vitals taken for this visit.    Wt Readings from Last 3 Encounters:  04/01/15 260 lb (117.935 kg)  03/28/15 259 lb 11.2 oz (117.8 kg)  05/08/13 241 lb (109.317 kg)     GEN: Well nourished, well developed, in no acute distress HEENT: normal Neck: no JVD, no carotid bruits, no masses Cardiac:  Normal S1/S2, RRR; no murmur ,  no rubs or gallops, no edema  Respiratory:  clear to auscultation bilaterally, no wheezing, rhonchi or rales. GI: soft, nontender, nondistended, + BS MS: no deformity or atrophy Skin: warm and dry  Neuro:  CNs II-XII intact, Strength and sensation are intact Psych: Normal affect   EKG:  EKG is ordered today.  It demonstrates:      Recent Labs: 03/23/2015: B Natriuretic Peptide 170.6* 04/01/2015: BUN 24*; Creatinine 1.47*; Hemoglobin 12.5; Platelets 418*; Potassium 5.2; Sodium 136    Lipid Panel    Component Value Date/Time   CHOL 239* 03/24/2015 0545   TRIG 137 03/24/2015 0545   HDL 56 03/24/2015 0545   CHOLHDL 4.3 03/24/2015 0545   VLDL 27 03/24/2015 0545   LDLCALC 156* 03/24/2015 0545    Recent Labs  03/23/15 1945 03/23/15 2340 03/24/15 0545 03/25/15 0357  TROPONINI 0.08* 0.10* 0.10* 0.18*    ASSESSMENT AND PLAN:  Chronic diastolic congestive heart failure  Hypertensive heart disease with heart failure  Sleep apnea  Dyslipidemia  Bronchitis    Current medicines are reviewed at length with the patient today.  Concerns regarding medicines are as outlined above.  The following changes have been made:       Labs/ tests ordered today include:  No orders of the defined types were placed in this encounter.    Disposition:   FU with     Signed, Richardson Dopp, PA-C, MHS 04/16/2015 5:22 PM    [facility] [address], [place], [place]  [address] Phone: [phone]; Fax: [phone]    This encounter was created in error - please disregard.

## 2015-04-17 ENCOUNTER — Encounter: Payer: Self-pay | Admitting: Physician Assistant

## 2015-04-19 ENCOUNTER — Ambulatory Visit: Payer: Self-pay | Attending: Internal Medicine | Admitting: Internal Medicine

## 2015-04-19 ENCOUNTER — Encounter: Payer: Self-pay | Admitting: Internal Medicine

## 2015-04-19 VITALS — BP 178/101 | HR 64 | Temp 98.3°F | Resp 16 | Ht 64.0 in | Wt 252.0 lb

## 2015-04-19 DIAGNOSIS — R944 Abnormal results of kidney function studies: Secondary | ICD-10-CM | POA: Insufficient documentation

## 2015-04-19 DIAGNOSIS — E119 Type 2 diabetes mellitus without complications: Secondary | ICD-10-CM | POA: Insufficient documentation

## 2015-04-19 DIAGNOSIS — Z7982 Long term (current) use of aspirin: Secondary | ICD-10-CM | POA: Insufficient documentation

## 2015-04-19 DIAGNOSIS — Z794 Long term (current) use of insulin: Secondary | ICD-10-CM | POA: Insufficient documentation

## 2015-04-19 DIAGNOSIS — I10 Essential (primary) hypertension: Secondary | ICD-10-CM | POA: Insufficient documentation

## 2015-04-19 DIAGNOSIS — R7989 Other specified abnormal findings of blood chemistry: Secondary | ICD-10-CM

## 2015-04-19 DIAGNOSIS — Z87891 Personal history of nicotine dependence: Secondary | ICD-10-CM | POA: Insufficient documentation

## 2015-04-19 DIAGNOSIS — I214 Non-ST elevation (NSTEMI) myocardial infarction: Secondary | ICD-10-CM | POA: Insufficient documentation

## 2015-04-19 DIAGNOSIS — I5032 Chronic diastolic (congestive) heart failure: Secondary | ICD-10-CM | POA: Insufficient documentation

## 2015-04-19 DIAGNOSIS — R748 Abnormal levels of other serum enzymes: Secondary | ICD-10-CM

## 2015-04-19 LAB — GLUCOSE, POCT (MANUAL RESULT ENTRY): POC GLUCOSE: 183 mg/dL — AB (ref 70–99)

## 2015-04-19 MED ORDER — INSULIN GLARGINE 100 UNIT/ML SOLOSTAR PEN: 10.0000 [IU] | PEN_INJECTOR | Freq: Every day | SUBCUTANEOUS | Status: DC

## 2015-04-19 MED ORDER — CARVEDILOL 25 MG PO TABS: 25.0000 mg | ORAL_TABLET | Freq: Two times a day (BID) | ORAL | Status: DC

## 2015-04-19 NOTE — Progress Notes (Signed)
Patient ID: Pamela Campbell, female   DOB: Oct 10, 1961, 54 y.o.   MRN: [id number]  CC: f/u  HPI: Pamela Campbell is a 54 y.o. female here today for a follow up visit.  Patient has past medical history of HTN, T2DM, and CHF. Patient was admitted in the hospital on 04/10/15 for CHF. She was found to have a NSTEMI as well. At discharge she was placed in 20 units of Lantus fof uncontrolled diabetes. She was later seen by Dr. Jarold Song shortly after discharge and placed on Metoprolol with the assumption that coreg was discontinued.  Patient presents today with medications and states that she has been taking metoprolol and coreg together without any c/o of SOB, dizziness, or syncopal episodes.  She reports that her sugars have been dropping while on 20 units of Lantus and thinks that the dosage may need to be dropped some. She reports that she is very skeptical of doctors because she has been to so many and her medications keep changing.   Patient has No headache, No chest pain, No abdominal pain - No Nausea, No new weakness tingling or numbness, No Cough - SOB.  Allergies  Allergen Reactions  . Vioxx [Rofecoxib] Other (See Comments)    Bleeding  . Compazine [Prochlorperazine Edisylate] Swelling   Past Medical History  Diagnosis Date  . Hypertension   . Diabetes mellitus without complication   . CHF (congestive heart failure)    Current Outpatient Prescriptions on File Prior to Visit  Medication Sig Dispense Refill  . amLODipine (NORVASC) 10 MG tablet Take 1 tablet (10 mg total) by mouth daily. 30 tablet 11  . aspirin 81 MG EC tablet Take 1 tablet (81 mg total) by mouth daily. 30 tablet   . furosemide (LASIX) 20 MG tablet Take 1 tablet (20 mg total) by mouth daily as needed for fluid. 30 tablet 5  . Insulin Glargine (LANTUS SOLOSTAR) 100 UNIT/ML Solostar Pen Inject 20 Units into the skin daily after breakfast. 15 mL 2  . losartan (COZAAR) 100 MG tablet Take 1 tablet (100 mg total) by mouth daily. 30 tablet  11  . metoprolol tartrate (LOPRESSOR) 25 MG tablet Take 1 tablet (25 mg total) by mouth 2 (two) times daily. 60 tablet 2  . Multiple Vitamin (MULTIVITAMIN WITH MINERALS) TABS tablet Take 1 tablet by mouth daily.    . nitroGLYCERIN (NITROSTAT) 0.4 MG SL tablet Place 1 tablet (0.4 mg total) under the tongue every 5 (five) minutes x 3 doses as needed for chest pain. 25 tablet 2  . Probiotic Product (PROBIOTIC DAILY) CAPS Take 1 capsule by mouth daily.    Marland Kitchen acetaminophen (TYLENOL) 325 MG tablet Take 2 tablets (650 mg total) by mouth every 4 (four) hours as needed for headache or mild pain. (Patient not taking: Reported on 04/19/2015)    . amoxicillin-clavulanate (AUGMENTIN) 875-125 MG per tablet Take 1 tablet by mouth every 12 (twelve) hours. (Patient not taking: Reported on 04/19/2015) 11 tablet 0  . atorvastatin (LIPITOR) 80 MG tablet Take 1 tablet (80 mg total) by mouth daily at 6 PM. (Patient not taking: Reported on 04/01/2015) 30 tablet 11  . b complex vitamins tablet Take 1 tablet by mouth daily.    . Digestive Enzymes (ENZYME DIGEST) CAPS Take 1 capsule by mouth daily.    Marland Kitchen HYDROcodone-acetaminophen (NORCO/VICODIN) 5-325 MG per tablet Take 1 tablet by mouth every 6 (six) hours as needed for pain. (Patient not taking: Reported on 04/01/2015) 20 tablet 0  . pantoprazole (  PROTONIX) 40 MG tablet Take 1 tablet (40 mg total) by mouth 2 (two) times daily before a meal. (Patient not taking: Reported on 04/01/2015) 30 tablet 5  . traMADol (ULTRAM) 50 MG tablet Take 1 tablet (50 mg total) by mouth every 8 (eight) hours as needed for pain. (Patient not taking: Reported on 04/01/2015) 30 tablet 0   No current facility-administered medications on file prior to visit.   Family History  Problem Relation Age of Onset  . Heart disease Father    History   Social History  . Marital Status: Married    Spouse Name: N/A  . Number of Children: N/A  . Years of Education: N/A   Occupational History  . Not on file.    Social History Main Topics  . Smoking status: Former Smoker    Quit date: 03/22/1995  . Smokeless tobacco: Not on file  . Alcohol Use: No  . Drug Use: No  . Sexual Activity: No   Other Topics Concern  . Not on file   Social History Narrative    Review of Systems: See HPI    Objective:   Filed Vitals:   04/19/15 1518  BP: 178/101  Pulse: 64  Temp: 98.3 F (36.8 C)  Resp: 16    Physical Exam: Constitutional: Patient appears well-developed and well-nourished. No distress. HENT: Normocephalic, atraumatic, External right and left ear normal. Oropharynx is clear and moist.  Eyes: Conjunctivae and EOM are normal. PERRLA, no scleral icterus. Neck: Normal ROM. Neck supple. No JVD. No tracheal deviation. No thyromegaly. CVS: RRR, S1/S2 +, no murmurs, no gallops, no carotid bruit.  Pulmonary: Effort and breath sounds normal, no stridor, rhonchi, wheezes, rales.  Abdominal: Soft. BS +,  no distension, tenderness, rebound or guarding.  Musculoskeletal: Normal range of motion. No edema and no tenderness.  Lymphadenopathy: No lymphadenopathy noted, cervical, inguinal or axillary Neuro: Alert. Normal reflexes, muscle tone coordination. No cranial nerve deficit. Skin: Skin is warm and dry. No rash noted. Not diaphoretic. No erythema. No pallor. Psychiatric: Normal mood and affect. Behavior, judgment, thought content normal.  Lab Results  Component Value Date   WBC 14.1* 04/01/2015   HGB 12.5 04/01/2015   HCT 38.4 04/01/2015   MCV 83.3 04/01/2015   PLT 418* 04/01/2015   Lab Results  Component Value Date   CREATININE 1.47* 04/01/2015   BUN 24* 04/01/2015   NA 136 04/01/2015   K 5.2 04/01/2015   CL 101 04/01/2015   CO2 27 04/01/2015    Lab Results  Component Value Date   HGBA1C 12.4* 03/25/2015   Lipid Panel     Component Value Date/Time   CHOL 239* 03/24/2015 0545   TRIG 137 03/24/2015 0545   HDL 56 03/24/2015 0545   CHOLHDL 4.3 03/24/2015 0545   VLDL 27  03/24/2015 0545   LDLCALC 156* 03/24/2015 0545       Assessment and plan:   Pamela Campbell was seen today for follow-up.  Diagnoses and all orders for this visit:  Type 2 diabetes mellitus without complication Orders: -     Glucose (CBG) -     Changed her insulin back down to 10 units. Insulin Glargine (LANTUS SOLOSTAR) 100 UNIT/ML Solostar Pen; Inject 10 Units into the skin daily at 10 pm. She will need to bring a log book in 2 weeks for review. Will slowly increase as needed.  Accelerated hypertension BP is still elevated. I will d/c Metoprolol and let her continue Coreg since it is better for  her CHF. She is on so many agents, may look at the option of later adding clonidine PO or the clonidine patch for better control.  Explained to patient diet modifications that may contribute to elevated BP. Patient will avoid foods that are high in sodium such as  canned soups and vegetables, tomato juice, commercial baked goods, commercially prepared frozen or canned entrees and sauces. Avoid salty snacks, added salt when cooking, and substituting for low sodium herbs or spices Explained exercise regimen of cardio at least three times weekly to help lower BP and cholesterol  Chronic diastolic congestive heart failure Orders: -     Refill carvedilol (COREG) 25 MG tablet; Take 1 tablet (25 mg total) by mouth 2 (two) times daily with a meal. Went over the importance of lasix as needed and what do do if she picks up more than 3 pounds in one day of 5 pounds in one week. She is set up to see Richardson Dopp, PA cardiologist per hospital discharge summary soon.   Creatinine elevation Orders: -     Ambulatory referral to Nephrology. ---GFR is trending down.  Will recheck bmet in one month.  >50% of visit spent coordinating care, med rec, and counseling  Return for with John--educator next week.       Chari Manning, [facility] [phone] 04/19/2015, 4:01 PM

## 2015-04-19 NOTE — Progress Notes (Signed)
Pt is here following up on her HTN, CHF and her diabetes. Pt is taking insulin once a day. Pt states that she does not feel normal on these medications.

## 2015-04-19 NOTE — Patient Instructions (Signed)
Get orange card so I can get you a cardiology referral  I have sent referral to kidney doctor hopefully at [facility] for kidney function  Change insulin to 10 units at night time--check sugar 3 times per day  Take Carvedilol 25 mg twice daily and we will see you back in 2 weeks for a recheck

## 2015-05-02 ENCOUNTER — Ambulatory Visit: Payer: Self-pay | Attending: Internal Medicine

## 2015-05-02 DIAGNOSIS — Z794 Long term (current) use of insulin: Secondary | ICD-10-CM | POA: Insufficient documentation

## 2015-05-02 DIAGNOSIS — E1165 Type 2 diabetes mellitus with hyperglycemia: Secondary | ICD-10-CM | POA: Insufficient documentation

## 2015-05-02 DIAGNOSIS — Z87891 Personal history of nicotine dependence: Secondary | ICD-10-CM | POA: Insufficient documentation

## 2015-05-02 DIAGNOSIS — E119 Type 2 diabetes mellitus without complications: Secondary | ICD-10-CM | POA: Insufficient documentation

## 2015-05-03 ENCOUNTER — Ambulatory Visit (HOSPITAL_BASED_OUTPATIENT_CLINIC_OR_DEPARTMENT_OTHER): Payer: Self-pay | Admitting: *Deleted

## 2015-05-03 VITALS — BP 188/92 | HR 60 | Temp 98.0°F | Resp 18 | Wt 248.8 lb

## 2015-05-03 DIAGNOSIS — E1165 Type 2 diabetes mellitus with hyperglycemia: Secondary | ICD-10-CM

## 2015-05-03 DIAGNOSIS — E119 Type 2 diabetes mellitus without complications: Secondary | ICD-10-CM

## 2015-05-03 LAB — GLUCOSE, POCT (MANUAL RESULT ENTRY): POC GLUCOSE: 198 mg/dL — AB (ref 70–99)

## 2015-05-03 MED ORDER — INSULIN GLARGINE 100 UNIT/ML SOLOSTAR PEN: 15.0000 [IU] | PEN_INJECTOR | Freq: Every day | SUBCUTANEOUS | Status: DC

## 2015-05-03 NOTE — Progress Notes (Signed)
Patient presents for BP check, CBG and record review for T2DM Med list reviewed; patient reports taking all meds as directed  Patient taking lantus 10 units q HS, carvedilol 25 mg bid, amlodipine 10 mg q HS,  losartan 100 mg q HS Patient's AM fasting blood sugars ranging 144-290 Patient's before lunch blood sugars ranging 119-323 Patient's before dinner blood sugars ranging 145-286 Denies increased thirst and urination Patient is not adding salt to foods or cooking with salt and using Mrs Deliah Boston as alternative to salt.  Eating only fresh fruits and vegetables  Patient walking 30 minutes per day for exercise Patient denies headaches, SHOB, chest pain  Positive for blurred vision; thinks due to age  CBG 198 4+ hours after meal   Lab Results  Component Value Date   HGBA1C 12.4* 03/25/2015   Filed Vitals:   05/03/15 1457  BP: 188/92  Pulse: 60  Temp: 98 F (36.7 C)  Resp: 18   Wt 248.8  Per PCP: Increase lantus to 15 units q HS Take amlodipine and losartan in AM  Patient advised to call for med refills at least 7 days before running out so as not to go without.  Patient to return in 2 weeks for nurse visit for BP check, CBG and record review  Patient given literature on Diabetes and Food, Diabetes and Exercise, Basic Carb Counting, Diabetes and Foot Care

## 2015-05-03 NOTE — Patient Instructions (Signed)
Diabetes and Foot Care Diabetes may cause you to have problems because of poor blood supply (circulation) to your feet and legs. This may cause the skin on your feet to become thinner, break easier, and heal more slowly. Your skin may become dry, and the skin may peel and crack. You may also have nerve damage in your legs and feet causing decreased feeling in them. You may not notice minor injuries to your feet that could lead to infections or more serious problems. Taking care of your feet is one of the most important things you can do for yourself.  HOME CARE INSTRUCTIONS  Wear shoes at all times, even in the house. Do not go barefoot. Bare feet are easily injured.  Check your feet daily for blisters, cuts, and redness. If you cannot see the bottom of your feet, use a mirror or ask someone for help.  Wash your feet with warm water (do not use hot water) and mild soap. Then pat your feet and the areas between your toes until they are completely dry. Do not soak your feet as this can dry your skin.  Apply a moisturizing lotion or petroleum jelly (that does not contain alcohol and is unscented) to the skin on your feet and to dry, brittle toenails. Do not apply lotion between your toes.  Trim your toenails straight across. Do not dig under them or around the cuticle. File the edges of your nails with an emery board or nail file.  Do not cut corns or calluses or try to remove them with medicine.  Wear clean socks or stockings every day. Make sure they are not too tight. Do not wear knee-high stockings since they may decrease blood flow to your legs.  Wear shoes that fit properly and have enough cushioning. To break in new shoes, wear them for just a few hours a day. This prevents you from injuring your feet. Always look in your shoes before you put them on to be sure there are no objects inside.  Do not cross your legs. This may decrease the blood flow to your feet.  If you find a minor scrape,  cut, or break in the skin on your feet, keep it and the skin around it clean and dry. These areas may be cleansed with mild soap and water. Do not cleanse the area with peroxide, alcohol, or iodine.  When you remove an adhesive bandage, be sure not to damage the skin around it.  If you have a wound, look at it several times a day to make sure it is healing.  Do not use heating pads or hot water bottles. They may burn your skin. If you have lost feeling in your feet or legs, you may not know it is happening until it is too late.  Make sure your health care provider performs a complete foot exam at least annually or more often if you have foot problems. Report any cuts, sores, or bruises to your health care provider immediately. SEEK MEDICAL CARE IF:   You have an injury that is not healing.  You have cuts or breaks in the skin.  You have an ingrown nail.  You notice redness on your legs or feet.  You feel burning or tingling in your legs or feet.  You have pain or cramps in your legs and feet.  Your legs or feet are numb.  Your feet always feel cold. SEEK IMMEDIATE MEDICAL CARE IF:   There is increasing redness,   swelling, or pain in or around a wound.  There is a red line that goes up your leg.  Pus is coming from a wound.  You develop a fever or as directed by your health care provider.  You notice a bad smell coming from an ulcer or wound. Document Released: 12/04/2000 Document Revised: 08/09/2013 Document Reviewed: 05/16/2013 [facility] Patient Information 2015 [place], [place]. This information is not intended to replace advice given to you by your health care provider. Make sure you discuss any questions you have with your health care provider. Diabetes and Exercise Exercising regularly is important. It is not just about losing weight. It has many health benefits, such as:  Improving your overall fitness, flexibility, and endurance.  Increasing your bone  density.  Helping with weight control.  Decreasing your body fat.  Increasing your muscle strength.  Reducing stress and tension.  Improving your overall health. People with diabetes who exercise gain additional benefits because exercise:  Reduces appetite.  Improves the body's use of blood sugar (glucose).  Helps lower or control blood glucose.  Decreases blood pressure.  Helps control blood lipids (such as cholesterol and triglycerides).  Improves the body's use of the hormone insulin by:  Increasing the body's insulin sensitivity.  Reducing the body's insulin needs.  Decreases the risk for heart disease because exercising:  Lowers cholesterol and triglycerides levels.  Increases the levels of good cholesterol (such as high-density lipoproteins [HDL]) in the body.  Lowers blood glucose levels. YOUR ACTIVITY PLAN  Choose an activity that you enjoy and set realistic goals. Your health care provider or diabetes educator can help you make an activity plan that works for you. Exercise regularly as directed by your health care provider. This includes:  Performing resistance training twice a week such as push-ups, sit-ups, lifting weights, or using resistance bands.  Performing 150 minutes of cardio exercises each week such as walking, running, or playing sports.  Staying active and spending no more than 90 minutes at one time being inactive. Even short bursts of exercise are good for you. Three 10-minute sessions spread throughout the day are just as beneficial as a single 30-minute session. Some exercise ideas include:  Taking the dog for a walk.  Taking the stairs instead of the elevator.  Dancing to your favorite song.  Doing an exercise video.  Doing your favorite exercise with a friend. RECOMMENDATIONS FOR EXERCISING WITH TYPE 1 OR TYPE 2 DIABETES   Check your blood glucose before exercising. If blood glucose levels are greater than 240 mg/dL, check for urine  ketones. Do not exercise if ketones are present.  Avoid injecting insulin into areas of the body that are going to be exercised. For example, avoid injecting insulin into:  The arms when playing tennis.  The legs when jogging.  Keep a record of:  Food intake before and after you exercise.  Expected peak times of insulin action.  Blood glucose levels before and after you exercise.  The type and amount of exercise you have done.  Review your records with your health care provider. Your health care provider will help you to develop guidelines for adjusting food intake and insulin amounts before and after exercising.  If you take insulin or oral hypoglycemic agents, watch for signs and symptoms of hypoglycemia. They include:  Dizziness.  Shaking.  Sweating.  Chills.  Confusion.  Drink plenty of water while you exercise to prevent dehydration or heat stroke. Body water is lost during exercise and must be  replaced.  Talk to your health care provider before starting an exercise program to make sure it is safe for you. Remember, almost any type of activity is better than none. Document Released: 02/27/2004 Document Revised: 04/23/2014 Document Reviewed: 05/16/2013 [facility] Patient Information 2015 [place], [place]. This information is not intended to replace advice given to you by your health care provider. Make sure you discuss any questions you have with your health care provider. Diabetes Mellitus and Food It is important for you to manage your blood sugar (glucose) level. Your blood glucose level can be greatly affected by what you eat. Eating healthier foods in the appropriate amounts throughout the day at about the same time each day will help you control your blood glucose level. It can also help slow or prevent worsening of your diabetes mellitus. Healthy eating may even help you improve the level of your blood pressure and reach or maintain a healthy weight.  HOW CAN FOOD  AFFECT ME? Carbohydrates Carbohydrates affect your blood glucose level more than any other type of food. Your dietitian will help you determine how many carbohydrates to eat at each meal and teach you how to count carbohydrates. Counting carbohydrates is important to keep your blood glucose at a healthy level, especially if you are using insulin or taking certain medicines for diabetes mellitus. Alcohol Alcohol can cause sudden decreases in blood glucose (hypoglycemia), especially if you use insulin or take certain medicines for diabetes mellitus. Hypoglycemia can be a life-threatening condition. Symptoms of hypoglycemia (sleepiness, dizziness, and disorientation) are similar to symptoms of having too much alcohol.  If your health care provider has given you approval to drink alcohol, do so in moderation and use the following guidelines:  Women should not have more than one drink per day, and men should not have more than two drinks per day. One drink is equal to:  12 oz of beer.  5 oz of wine.  1 oz of hard liquor.  Do not drink on an empty stomach.  Keep yourself hydrated. Have water, diet soda, or unsweetened iced tea.  Regular soda, juice, and other mixers might contain a lot of carbohydrates and should be counted. WHAT FOODS ARE NOT RECOMMENDED? As you make food choices, it is important to remember that all foods are not the same. Some foods have fewer nutrients per serving than other foods, even though they might have the same number of calories or carbohydrates. It is difficult to get your body what it needs when you eat foods with fewer nutrients. Examples of foods that you should avoid that are high in calories and carbohydrates but low in nutrients include:  Trans fats (most processed foods list trans fats on the Nutrition Facts label).  Regular soda.  Juice.  Candy.  Sweets, such as cake, pie, doughnuts, and cookies.  Fried foods. WHAT FOODS CAN I EAT? Have  nutrient-rich foods, which will nourish your body and keep you healthy. The food you should eat also will depend on several factors, including:  The calories you need.  The medicines you take.  Your weight.  Your blood glucose level.  Your blood pressure level.  Your cholesterol level. You also should eat a variety of foods, including:  Protein, such as meat, poultry, fish, tofu, nuts, and seeds (lean animal proteins are best).  Fruits.  Vegetables.  Dairy products, such as milk, cheese, and yogurt (low fat is best).  Breads, grains, pasta, cereal, rice, and beans.  Fats such as olive oil,  trans fat-free margarine, canola oil, avocado, and olives. DOES EVERYONE WITH DIABETES MELLITUS HAVE THE SAME MEAL PLAN? Because every person with diabetes mellitus is different, there is not one meal plan that works for everyone. It is very important that you meet with a dietitian who will help you create a meal plan that is just right for you. Document Released: 09/03/2005 Document Revised: 12/12/2013 Document Reviewed: 11/03/2013 [facility] Patient Information 2015 [place], [place]. This information is not intended to replace advice given to you by your health care provider. Make sure you discuss any questions you have with your health care provider. Basic Carbohydrate Counting for Diabetes Mellitus Carbohydrate counting is a method for keeping track of the amount of carbohydrates you eat. Eating carbohydrates naturally increases the level of sugar (glucose) in your blood, so it is important for you to know the amount that is okay for you to have in every meal. Carbohydrate counting helps keep the level of glucose in your blood within normal limits. The amount of carbohydrates allowed is different for every person. A dietitian can help you calculate the amount that is right for you. Once you know the amount of carbohydrates you can have, you can count the carbohydrates in the foods you want to  eat. Carbohydrates are found in the following foods:  Grains, such as breads and cereals.  Dried beans and soy products.  Starchy vegetables, such as potatoes, peas, and corn.  Fruit and fruit juices.  Milk and yogurt.  Sweets and snack foods, such as cake, cookies, candy, chips, soft drinks, and fruit drinks. CARBOHYDRATE COUNTING There are two ways to count the carbohydrates in your food. You can use either of the methods or a combination of both. Reading the "Nutrition Facts" on [place] The "Nutrition Facts" is an area that is included on the labels of almost all packaged food and beverages in the [place]. It includes the serving size of that food or beverage and information about the nutrients in each serving of the food, including the grams (g) of carbohydrate per serving.  Decide the number of servings of this food or beverage that you will be able to eat or drink. Multiply that number of servings by the number of grams of carbohydrate that is listed on the label for that serving. The total will be the amount of carbohydrates you will be having when you eat or drink this food or beverage. Learning Standard Serving Sizes of Food When you eat food that is not packaged or does not include "Nutrition Facts" on the label, you need to measure the servings in order to count the amount of carbohydrates.A serving of most carbohydrate-rich foods contains about 15 g of carbohydrates. The following list includes serving sizes of carbohydrate-rich foods that provide 15 g ofcarbohydrate per serving:   1 slice of bread (1 oz) or 1 six-inch tortilla.    of a hamburger bun or English muffin.  4-6 crackers.   cup unsweetened dry cereal.    cup hot cereal.   cup rice or pasta.    cup mashed potatoes or  of a large baked potato.  1 cup fresh fruit or one small piece of fruit.    cup canned or frozen fruit or fruit juice.  1 cup milk.   cup plain fat-free yogurt or  yogurt sweetened with artificial sweeteners.   cup cooked dried beans or starchy vegetable, such as peas, corn, or potatoes.  Decide the number of standard-size servings that you  will eat. Multiply that number of servings by 15 (the grams of carbohydrates in that serving). For example, if you eat 2 cups of strawberries, you will have eaten 2 servings and 30 g of carbohydrates (2 servings x 15 g = 30 g). For foods such as soups and casseroles, in which more than one food is mixed in, you will need to count the carbohydrates in each food that is included. EXAMPLE OF CARBOHYDRATE COUNTING Sample Dinner  3 oz chicken breast.   cup of brown rice.   cup of corn.  1 cup milk.   1 cup strawberries with sugar-free whipped topping.  Carbohydrate Calculation Step 1: Identify the foods that contain carbohydrates:   Rice.   Corn.   Milk.   Strawberries. Step 2:Calculate the number of servings eaten of each:   2 servings of rice.   1 serving of corn.   1 serving of milk.   1 serving of strawberries. Step 3: Multiply each of those number of servings by 15 g:   2 servings of rice x 15 g = 30 g.   1 serving of corn x 15 g = 15 g.   1 serving of milk x 15 g = 15 g.   1 serving of strawberries x 15 g = 15 g. Step 4: Add together all of the amounts to find the total grams of carbohydrates eaten: 30 g + 15 g + 15 g + 15 g = 75 g. Document Released: 12/07/2005 Document Revised: 04/23/2014 Document Reviewed: 11/03/2013 [facility] Patient Information 2015 [place], [place]. This information is not intended to replace advice given to you by your health care provider. Make sure you discuss any questions you have with your health care provider.

## 2015-05-15 ENCOUNTER — Other Ambulatory Visit: Payer: Self-pay | Admitting: Internal Medicine

## 2015-05-15 DIAGNOSIS — E119 Type 2 diabetes mellitus without complications: Secondary | ICD-10-CM

## 2015-05-15 MED ORDER — INSULIN GLARGINE 100 UNIT/ML SOLOSTAR PEN: 15.0000 [IU] | PEN_INJECTOR | Freq: Every day | SUBCUTANEOUS | Status: DC

## 2015-05-16 ENCOUNTER — Ambulatory Visit: Payer: Self-pay | Attending: Internal Medicine

## 2015-05-27 ENCOUNTER — Ambulatory Visit: Payer: Self-pay | Attending: Internal Medicine | Admitting: *Deleted

## 2015-05-27 VITALS — BP 180/88 | HR 64 | Temp 98.3°F | Resp 20 | Ht 64.0 in | Wt 248.6 lb

## 2015-05-27 DIAGNOSIS — E119 Type 2 diabetes mellitus without complications: Secondary | ICD-10-CM | POA: Insufficient documentation

## 2015-05-27 DIAGNOSIS — E1165 Type 2 diabetes mellitus with hyperglycemia: Secondary | ICD-10-CM

## 2015-05-27 DIAGNOSIS — Z794 Long term (current) use of insulin: Secondary | ICD-10-CM | POA: Insufficient documentation

## 2015-05-27 DIAGNOSIS — I10 Essential (primary) hypertension: Secondary | ICD-10-CM

## 2015-05-27 LAB — COMPLETE METABOLIC PANEL WITH GFR
ALBUMIN: 3.4 g/dL — AB (ref 3.5–5.2)
ALK PHOS: 106 U/L (ref 39–117)
ALT: 21 U/L (ref 0–35)
AST: 17 U/L (ref 0–37)
BUN: 27 mg/dL — ABNORMAL HIGH (ref 6–23)
CALCIUM: 9.2 mg/dL (ref 8.4–10.5)
CHLORIDE: 106 meq/L (ref 96–112)
CO2: 25 meq/L (ref 19–32)
Creat: 1.25 mg/dL — ABNORMAL HIGH (ref 0.50–1.10)
GFR, EST AFRICAN AMERICAN: 57 mL/min — AB
GFR, Est Non African American: 50 mL/min — ABNORMAL LOW
Glucose, Bld: 183 mg/dL — ABNORMAL HIGH (ref 70–99)
POTASSIUM: 4.3 meq/L (ref 3.5–5.3)
Sodium: 140 mEq/L (ref 135–145)
TOTAL PROTEIN: 6.6 g/dL (ref 6.0–8.3)
Total Bilirubin: 0.3 mg/dL (ref 0.2–1.2)

## 2015-05-27 LAB — GLUCOSE, POCT (MANUAL RESULT ENTRY): POC GLUCOSE: 196 mg/dL — AB (ref 70–99)

## 2015-05-27 MED ORDER — TRUEPLUS LANCETS 30G MISC: Status: DC

## 2015-05-27 MED ORDER — GLUCOSE BLOOD VI STRP: ORAL_STRIP | Status: DC

## 2015-05-27 MED ORDER — TRUE METRIX METER W/DEVICE KIT: PACK | Status: DC

## 2015-05-27 NOTE — Progress Notes (Signed)
Patient presents for CBG and record review for T2DM, however, patient did not bring log or meter Med list reviewed; patient reports taking all meds as directed except lipitor. States thinks it affects kidneys. Patient also stopped all supplements Patient reports AM fasting blood sugars ranging 130-150 Patient reports before lunch blood sugars ranging 152-170 Patient reports before dinner blood sugars ranging 130-190 States she hasn't checked BS in 4-5 days due to running out of test strips Test strips and lancets e-scribed to [facility] pharmacy Denies increased thirst and urination; states drinking more water due to heat (outside temp)   CBG 196 2 hours after meal supplement shake from [employer]  Lab Results  Component Value Date   HGBA1C 12.4* 03/25/2015   Filed Vitals:   05/27/15 1048  BP: 180/88  Pulse: 64  Temp: 98.3 F (36.8 C)  Resp: 20    Per PCP: CMP with GFR Urine microalbumin No changes to BP meds at this time Keep appt with nephrologist on 06/04/15 to discuss BP  Return in 4 weeks for nurse visit for BP check, CBG and record review

## 2015-05-28 LAB — MICROALBUMIN, URINE: Microalb, Ur: 236.8 mg/dL — ABNORMAL HIGH (ref <2.0)

## 2015-06-17 ENCOUNTER — Telehealth: Payer: Self-pay

## 2015-06-17 NOTE — Telephone Encounter (Signed)
Nurse called patient, reached voicemail. Left message for patient to call Attallah Ontko with [facility], at [phone].  

## 2015-06-17 NOTE — Telephone Encounter (Signed)
-----   Message from Lance Bosch, NP sent at 05/31/2015 11:14 AM EDT ----- Please let patient know to have her Nephrologist to look at lab results from [facility]. I would like him to see her urine microalbumin level

## 2015-06-18 NOTE — Telephone Encounter (Signed)
Nurse called patient, patient verified date of birth. Patient aware of letting nephrologist know to look at lab results from [facility]. Valerie requests nephrologist to see urine microalbumin level.  Patient reports doctor at [facility] reports kidneys are functioning normal, there may be a cyst on one kidney. They will monitor.  Patient reports they reported she still has kidney disease but they are functioning normally. Doctor at [facility] will see patient in 6 months.  Patient voices understanding and has no further questions at this time.

## 2015-06-26 ENCOUNTER — Ambulatory Visit: Payer: Self-pay | Attending: Internal Medicine | Admitting: *Deleted

## 2015-06-26 VITALS — BP 160/78 | HR 61 | Temp 98.6°F | Resp 18 | Ht 64.0 in | Wt 245.4 lb

## 2015-06-26 DIAGNOSIS — E1165 Type 2 diabetes mellitus with hyperglycemia: Secondary | ICD-10-CM

## 2015-06-26 DIAGNOSIS — I10 Essential (primary) hypertension: Secondary | ICD-10-CM | POA: Insufficient documentation

## 2015-06-26 DIAGNOSIS — E119 Type 2 diabetes mellitus without complications: Secondary | ICD-10-CM | POA: Insufficient documentation

## 2015-06-26 LAB — GLUCOSE, POCT (MANUAL RESULT ENTRY): POC GLUCOSE: 217 mg/dL — AB (ref 70–99)

## 2015-06-26 LAB — POCT GLYCOSYLATED HEMOGLOBIN (HGB A1C): HEMOGLOBIN A1C: 8.4

## 2015-06-26 MED ORDER — INSULIN GLARGINE 100 UNIT/ML SOLOSTAR PEN: 17.0000 [IU] | PEN_INJECTOR | Freq: Every day | SUBCUTANEOUS | Status: DC

## 2015-06-26 NOTE — Progress Notes (Signed)
Patient presents for BP check, CBG and record review for T2DM Med list reviewed; patient reports stopping lasix per nephrologist and started on HCTZ 25 mg daily Patient states she has been taking 5 mg amlodipine since April and only started 10 mg 2 weeks ago. Verified with [facility] pharmacy patient has been receiving 5 mg amlodipine. Patient reiterates today that she will not take lipitor because she thinks it will affect her kidneys Taking lantus 15 units at 10 pm Patient's AM fasting blood sugars ranging 110-248 Patient's before dinner blood sugars ranging 121-244 Denies increased thirst and urination Positive for blurry vision but states stopped wearing her corrective lenses Saw nephrologist on 06/04/15 and has second appt today  CBG 173 AM fasting per patient record CBG 217 2 + hours after meal replacement shake Hgb A1c 8.4  Lab Results  Component Value Date   HGBA1C 12.4* 03/25/2015   Filed Vitals:   06/26/15 1158  BP: 160/78  Pulse: 61  Temp: 98.6 F (37 C)  Resp: 18    Patient has home BP monitoring device. States 2 days ago BP was 124/78 Patient instructed to keep BP log with date and time and bring log to all future visits.   Per PCP: Increase lantus to 17 units q HS Update med list to reflect meds patient is taking    Patient advised to call for med refills at least 7 days before running out so as not to go without.  Patient aware that she is to f/u with PCP 3 months from last visit. Due 07/19/2015

## 2015-07-15 ENCOUNTER — Other Ambulatory Visit: Payer: Self-pay | Admitting: Internal Medicine

## 2015-07-15 DIAGNOSIS — I10 Essential (primary) hypertension: Secondary | ICD-10-CM

## 2015-07-15 MED ORDER — AMLODIPINE BESYLATE 10 MG PO TABS: 10.0000 mg | ORAL_TABLET | Freq: Every day | ORAL | Status: DC

## 2015-07-17 ENCOUNTER — Encounter: Payer: Self-pay | Admitting: Internal Medicine

## 2015-07-17 ENCOUNTER — Ambulatory Visit: Payer: Self-pay | Admitting: Internal Medicine

## 2015-07-17 ENCOUNTER — Ambulatory Visit: Payer: Self-pay | Attending: Internal Medicine | Admitting: Internal Medicine

## 2015-07-17 VITALS — BP 136/82 | HR 65 | Temp 98.5°F | Resp 16 | Ht 64.0 in | Wt 238.0 lb

## 2015-07-17 DIAGNOSIS — N183 Chronic kidney disease, stage 3 unspecified: Secondary | ICD-10-CM

## 2015-07-17 DIAGNOSIS — I129 Hypertensive chronic kidney disease with stage 1 through stage 4 chronic kidney disease, or unspecified chronic kidney disease: Secondary | ICD-10-CM | POA: Insufficient documentation

## 2015-07-17 DIAGNOSIS — E785 Hyperlipidemia, unspecified: Secondary | ICD-10-CM

## 2015-07-17 DIAGNOSIS — Z1211 Encounter for screening for malignant neoplasm of colon: Secondary | ICD-10-CM

## 2015-07-17 DIAGNOSIS — I10 Essential (primary) hypertension: Secondary | ICD-10-CM

## 2015-07-17 DIAGNOSIS — Z23 Encounter for immunization: Secondary | ICD-10-CM

## 2015-07-17 DIAGNOSIS — E118 Type 2 diabetes mellitus with unspecified complications: Secondary | ICD-10-CM | POA: Insufficient documentation

## 2015-07-17 DIAGNOSIS — E7800 Pure hypercholesterolemia, unspecified: Secondary | ICD-10-CM | POA: Insufficient documentation

## 2015-07-17 DIAGNOSIS — Z79899 Other long term (current) drug therapy: Secondary | ICD-10-CM | POA: Insufficient documentation

## 2015-07-17 DIAGNOSIS — Z7982 Long term (current) use of aspirin: Secondary | ICD-10-CM | POA: Insufficient documentation

## 2015-07-17 LAB — GLUCOSE, POCT (MANUAL RESULT ENTRY): POC Glucose: 140 mg/dl — AB (ref 70–99)

## 2015-07-17 NOTE — Progress Notes (Signed)
F/u DM Glucoses been running 101-270

## 2015-07-17 NOTE — Progress Notes (Signed)
Patient ID: Pamela Campbell, female   DOB: 27-Oct-1961, 54 y.o.   MRN: [id number] SUBJECTIVE: 54 y.o. female for follow up of diabetes and HTN. Diabetic Review of Systems - medication compliance: compliant all of the time, diabetic diet compliance: compliant most of the time, home glucose monitoring: is performed regularly, fasting values range 101-270. She states that her numbers are mostly in the 100's., further diabetic ROS: no polyuria or polydipsia, no chest pain, dyspnea or TIA's, has dysesthesias in the feet.  Since our last visit patient has been followed by Nephrology at [facility]. She states that some of her medications were changed and she is now on Amlodipine 10 and HCTZ 25 daily. She reports daily BP checks which are usually around 126/66. Of note she has lost 22 pounds in 4 months intentionally.  Current Outpatient Prescriptions  Medication Sig Dispense Refill  . acetaminophen (TYLENOL) 325 MG tablet Take 2 tablets (650 mg total) by mouth every 4 (four) hours as needed for headache or mild pain.    Marland Kitchen amLODipine (NORVASC) 10 MG tablet Take 1 tablet (10 mg total) by mouth daily. 30 tablet 11  . aspirin 81 MG EC tablet Take 1 tablet (81 mg total) by mouth daily. 30 tablet   . b complex vitamins tablet Take 1 tablet by mouth daily.    . Blood Glucose Monitoring Suppl (TRUE METRIX METER) W/DEVICE KIT Check blood sugar 4 times daily AC and HS 1 kit 0  . carvedilol (COREG) 25 MG tablet Take 1 tablet (25 mg total) by mouth 2 (two) times daily with a meal. 60 tablet 4  . cholecalciferol (VITAMIN D) 1000 UNITS tablet Take 5,000 Units by mouth daily.    . Digestive Enzymes (ENZYME DIGEST) CAPS Take 1 capsule by mouth daily.    Marland Kitchen glucose blood test strip 1 each by Other route as needed for other. Use as instructed    . glucose blood test strip Use as instructed 100 each 12  . hydrochlorothiazide (HYDRODIURIL) 25 MG tablet Take 25 mg by mouth daily.    . Insulin Glargine (LANTUS SOLOSTAR) 100 UNIT/ML  Solostar Pen Inject 17 Units into the skin daily at 10 pm. 15 mL 3  . losartan (COZAAR) 100 MG tablet Take 1 tablet (100 mg total) by mouth daily. 30 tablet 11  . milk thistle 175 MG tablet Take 175 mg by mouth daily.    . Multiple Vitamin (MULTIVITAMIN WITH MINERALS) TABS tablet Take 1 tablet by mouth daily.    . nitroGLYCERIN (NITROSTAT) 0.4 MG SL tablet Place 1 tablet (0.4 mg total) under the tongue every 5 (five) minutes x 3 doses as needed for chest pain. 25 tablet 2  . pantoprazole (PROTONIX) 40 MG tablet Take 1 tablet (40 mg total) by mouth 2 (two) times daily before a meal. 30 tablet 5  . Probiotic Product (PROBIOTIC DAILY) CAPS Take 1 capsule by mouth daily.    . traMADol (ULTRAM) 50 MG tablet Take 1 tablet (50 mg total) by mouth every 8 (eight) hours as needed for pain. 30 tablet 0  . TRUEPLUS LANCETS 30G MISC Check Blood sugar 4 times daily AC and HS 100 each 3   No current facility-administered medications for this visit.    OBJECTIVE: Appearance: alert, well appearing, and in no distress, oriented to person, place, and time and overweight. BP 144/81 mmHg  Pulse 65  Temp(Src) 98.5 F (36.9 C)  Resp 16  Ht _0  (1.626 m)  Wt 238 lb (107.956  kg)  BMI 40.83 kg/m2  SpO2 95%  LMP 02/02/2015  Exam: heart sounds normal rate, regular rhythm, normal S1, S2, no murmurs, rubs, clicks or gallops, no JVD, chest clear, no carotid bruits, refused foot exam today  Pamela Campbell was seen today for follow-up.  Diagnoses and all orders for this visit:  Type 2 diabetes mellitus with complication Orders: -     POCT glucose (manual entry) Diabetes has improved dramatically from a A1C of 12.4 to 8.4%. Praised patient on hard work and encouraged her to continue to maintain diet and exercise. Addressed foot care as well today.  Will complete foot exam on next visit  Accelerated hypertension  Patient blood pressure is stable and may continue on current medication.  Education on diet, exercise,  and modifiable risk factors discussed. Will obtain appropriate labs as needed. Will follow up in 3-6 months.  Continue f/u with Nephrology as well   CKD, stage 3 Continue with Nephrology. Avoid all nephrotoxic agents such as NSAID's. Ask pharmacist before taking any OTC medications  HLD Patient continues to refuse cholesterol medication because she feels like it was causing her blood sugars to drop. She has stopped medication and reports that she will not restart.   Colon cancer screening Orders: -     Ambulatory referral to Gastroenterology Patient needs colonoscopy  Need for Tdap vaccination Orders: -     Tdap vaccine greater than or equal to 7yo IM   Return in about 4 weeks (around 08/14/2015) for pap and 3 mo PCP DM/HTN.  Lance Bosch, NP 07/17/2015 9:56 PM

## 2015-07-26 ENCOUNTER — Telehealth: Payer: Self-pay | Admitting: Internal Medicine

## 2015-07-26 NOTE — Telephone Encounter (Signed)
Patient called to let PCP know of her BP 117/58, she would like to know if she still needs to take her BP meds and she did not take the medication today..the patient was asked to hold while a nurse could get on the line for advice. Patient hung up the phone before the nurse could speak to her.

## 2015-08-07 ENCOUNTER — Telehealth: Payer: Self-pay

## 2015-08-07 NOTE — Telephone Encounter (Signed)
-----   Message from Lance Bosch, NP sent at 07/17/2015  9:58 PM EDT ----- If you have time, call patient and inform her that I wanted her to start back on her cholesterol medication. She may refuse but tell her I do not believe it caused low blood sugar but it will be beneficial for her to lower her risk of heart disease and stroke. I forgot stress this at office visit. If she absolutely refuse please tell her about diet such as fried foods, carbs, butters, oils that can increase her risk. Weight loss and cardio exercise can help. Thanks

## 2015-08-07 NOTE — Telephone Encounter (Signed)
Nurse called patient, reached voicemail. Left message for patient to call Shaquavia Whisonant with [facility], at [phone].  

## 2015-08-08 NOTE — Telephone Encounter (Signed)
Nurse called patient, patient verified date of birth. Nurse explained to patient, provider would like her to start back on her cholesterol medication. Patient explains she had rather double up on fish oil.  Nurse explained provider does not believe it caused low blood sugar and it will be beneficial for her to lower her risk of heart disease and stroke. Patient aware of diet recommendations and does not eat fried foods, carbs but does use butters and oils. Nurse educated patient about low cholesterol, low fat diet and the importance of limiting butter and oils also. Patient reports losing weight recently. Please advise if patient should double up on fish oil?

## 2015-08-08 NOTE — Telephone Encounter (Signed)
-----   Message from Lance Bosch, NP sent at 07/17/2015  9:58 PM EDT ----- If you have time, call patient and inform her that I wanted her to start back on her cholesterol medication. She may refuse but tell her I do not believe it caused low blood sugar but it will be beneficial for her to lower her risk of heart disease and stroke. I forgot stress this at office visit. If she absolutely refuse please tell her about diet such as fried foods, carbs, butters, oils that can increase her risk. Weight loss and cardio exercise can help. Thanks

## 2015-08-09 NOTE — Telephone Encounter (Signed)
What dose does she have and how often is she taking it

## 2015-08-14 ENCOUNTER — Ambulatory Visit: Payer: Self-pay | Attending: Internal Medicine | Admitting: Internal Medicine

## 2015-08-14 ENCOUNTER — Encounter: Payer: Self-pay | Admitting: Internal Medicine

## 2015-08-14 VITALS — BP 143/87 | HR 64 | Temp 98.0°F | Resp 16 | Ht 64.0 in | Wt 231.6 lb

## 2015-08-14 DIAGNOSIS — Z794 Long term (current) use of insulin: Secondary | ICD-10-CM | POA: Insufficient documentation

## 2015-08-14 DIAGNOSIS — E118 Type 2 diabetes mellitus with unspecified complications: Secondary | ICD-10-CM | POA: Insufficient documentation

## 2015-08-14 DIAGNOSIS — Z124 Encounter for screening for malignant neoplasm of cervix: Secondary | ICD-10-CM | POA: Insufficient documentation

## 2015-08-14 DIAGNOSIS — Z87891 Personal history of nicotine dependence: Secondary | ICD-10-CM | POA: Insufficient documentation

## 2015-08-14 DIAGNOSIS — N63 Unspecified lump in breast: Secondary | ICD-10-CM | POA: Insufficient documentation

## 2015-08-14 DIAGNOSIS — N6310 Unspecified lump in the right breast, unspecified quadrant: Secondary | ICD-10-CM

## 2015-08-14 LAB — GLUCOSE, POCT (MANUAL RESULT ENTRY): POC GLUCOSE: 138 mg/dL — AB (ref 70–99)

## 2015-08-14 MED ORDER — INSULIN GLARGINE 100 UNIT/ML SOLOSTAR PEN: 19.0000 [IU] | PEN_INJECTOR | Freq: Every day | SUBCUTANEOUS | Status: DC

## 2015-08-14 NOTE — Progress Notes (Signed)
Patient here for her annual pap only and for her breast  exam

## 2015-08-14 NOTE — Progress Notes (Signed)
   Subjective:    Patient ID: Pamela Campbell, female    DOB: 05-25-1961, 54 y.o.   MRN: [id number]  Gynecologic Exam The patient's pertinent negatives include no genital itching, genital lesions, genital odor, pelvic pain or vaginal discharge. She is not pregnant. Pertinent negatives include no abdominal pain, dysuria or rash. No, her partner does not have an STD. She uses nothing for contraception. She is postmenopausal. There is no history of an abdominal surgery, a gynecological surgery or an STD.    Review of Systems  Gastrointestinal: Negative for abdominal pain.  Genitourinary: Negative for dysuria, vaginal discharge and pelvic pain.  Skin: Negative for rash.       Objective:   Physical Exam  Cardiovascular: Normal rate, regular rhythm and normal heart sounds.   Pulmonary/Chest: Effort normal and breath sounds normal. Right breast exhibits mass.    Genitourinary: Vagina normal and uterus normal. No breast swelling, tenderness, discharge or bleeding. Cervix exhibits no motion tenderness, no discharge and no friability. Right adnexum displays no tenderness. Left adnexum displays no tenderness.  Lymphadenopathy:       Right: No inguinal adenopathy present.       Left: No inguinal adenopathy present.  Skin:  Bilateral feet without ulcers, normal DP pulses, and normal monofilament exam        Assessment & Plan:  Pamela Campbell was seen today for gynecologic exam.  Diagnoses and all orders for this visit:  Papanicolaou smear -     Cytology - PAP [place]  If it is normal will repeat in 3 years   Breast mass, right -     MM Digital Diagnostic Bilat; Future  Type 2 diabetes mellitus with complication -     Glucose (CBG) -     Insulin Glargine (LANTUS SOLOSTAR) 100 UNIT/ML Solostar Pen; Inject 19 Units into the skin daily at 10 pm. I have increased her Lantus from 17 to 19 units. She is stable, I will continue to monitor. Fasting AM cbg are 174-224.    Return if symptoms  worsen or fail to improve.    Lance Bosch, NP 08/15/2015 9:26 AM

## 2015-08-16 LAB — CYTOLOGY - PAP

## 2015-08-16 LAB — CERVICOVAGINAL ANCILLARY ONLY
CHLAMYDIA, DNA PROBE: NEGATIVE
Neisseria Gonorrhea: NEGATIVE
Wet Prep (BD Affirm): NEGATIVE

## 2015-08-20 ENCOUNTER — Telehealth: Payer: Self-pay

## 2015-08-20 NOTE — Telephone Encounter (Signed)
Patient not available Left message on voice mail to return our call 

## 2015-08-20 NOTE — Telephone Encounter (Signed)
-----   Message from Lance Bosch, NP sent at 08/20/2015 10:47 AM EDT ----- Pap is normal, repeat in 3 years.

## 2015-08-29 ENCOUNTER — Ambulatory Visit: Payer: Self-pay | Attending: Internal Medicine | Admitting: Pharmacist

## 2015-08-29 VITALS — BP 126/88 | Wt 227.8 lb

## 2015-08-29 DIAGNOSIS — E118 Type 2 diabetes mellitus with unspecified complications: Secondary | ICD-10-CM | POA: Insufficient documentation

## 2015-08-29 DIAGNOSIS — Z794 Long term (current) use of insulin: Secondary | ICD-10-CM | POA: Insufficient documentation

## 2015-08-29 NOTE — Patient Instructions (Signed)
It was great to see you today!  You are doing a great job - this is hard so keep going!  Increase the Lantus to 20 units every day  Come back and see me in two weeks or you can see Mateo Flow if you need to

## 2015-08-30 NOTE — Progress Notes (Signed)
S:    Patient arrives in good spirits for diabetes follow up.      Patient reports adherence with medications. Current diabetes medications include Lantus 17 units daily (was supposed to increase to 19 units daily but did not).  Patient reports that she believes that insulin is making her sugars go up and she would like to get off of it. She reports that every time her dose is increased, her sugars get worse.  She also reports that she wants to get off of her antihypertensive medications. She brings with her a log of her blood pressures at home and is very happy with the improvement in her numbers.   Patient denies hypoglycemic events.  Patient reported dietary habits: has cut out sweets and focuses on eating meats and vegetables.  Patient reported exercise habits: works out at gym and also has a labor intensive job at a plant.      O:  . Lab Results  Component Value Date   HGBA1C 8.4 06/26/2015     Home fasting CBG: 119 - 218 (most 160s-190s) 2 hour post-prandial/random CBG: 160s-200s.  A/P: Diabetes currently uncontrolled based off of A1c of 8.4 and home blood glucose readings.  Denies hypoglycemic events and is able to verbalize appropriate hypoglycemia management plan.  Reports adherence with medication. Control is suboptimal due to failure to increase insulin dose as instructed by Chari Manning.  Increased dose of basal insulin Lantus (insulin glargine) to 20 units daily. Spent time educating patient on the need for insulin and her blood pressure medications, and that her blood glucose or blood pressure would have to be too low to remove any of these medications or to decrease the dose. Also congratulated patient on weight loss and discussed trying to stay motivated even though her blood sugars are not where she wants them to be. Also discussed that insulin should bring her numbers down and not increase them and to continue to keep a log so I can evaluate it at the next  visit.  Next A1C anticipated October 2016.  Written patient instructions provided.  Follow up in Pharmacist Clinic Visit in two weeks. Total time in face to face counseling 20 minutes.

## 2015-09-11 ENCOUNTER — Ambulatory Visit: Payer: Self-pay | Attending: Internal Medicine | Admitting: Pharmacist

## 2015-09-11 DIAGNOSIS — Z794 Long term (current) use of insulin: Secondary | ICD-10-CM | POA: Insufficient documentation

## 2015-09-11 DIAGNOSIS — E118 Type 2 diabetes mellitus with unspecified complications: Secondary | ICD-10-CM | POA: Insufficient documentation

## 2015-09-11 MED ORDER — INSULIN GLARGINE 100 UNIT/ML SOLOSTAR PEN: 22.0000 [IU] | PEN_INJECTOR | Freq: Every day | SUBCUTANEOUS | Status: DC

## 2015-09-11 NOTE — Patient Instructions (Signed)
Thanks for coming in to see me today!  Try not to get too hard on yourself! You will come on the other side of this stronger - you just have to be patient- you can do it!!  Increase your Lantus to 22 units daily  We will order you diabetic shoes - I hope they help you to move around with less pain!

## 2015-09-11 NOTE — Progress Notes (Signed)
S:    Patient arrives in good spirits but frustrated with her weight loss progress.  Presents for diabetes follow up.    Patient reports adherence with medications. Current diabetes medications include Lantus 20 units daily.  Patient denies hypoglycemic events.  Patient is working very hard to eat health and exercise. She lifts weights, does cardio, and walks a lot at work.   Patient denies nocturia.  Patient reports neuropathy. It is making it difficult for her to exercise Patient denies visual changes. Patient reports self foot exams.    O:  Lab Results  Component Value Date   HGBA1C 8.4 06/26/2015    Home fasting CBG: 120-217  2 hour post-prandial/random CBG: 108-208.  A/P: Diabetes currently uncontrolled based on A1c of 8.4 and home blood glucose readings. Denies hypoglycemic events and is able to verbalize appropriate hypoglycemia management plan.  Patient reports adherence with medication. Control is suboptimal due to decrease in physical activity due to neuropathy.  Increased dose of basal insulin Lantus (insulin glargine) to 22 units daily. Patient is noticing a plateau in her weight loss and I am worried that insulin is contributing some to weight gain ( she has gained 3 pounds since last visit). We discussed drug-induced weight loss through medications, such as GLP-1 agonist, but both agree that patient should try to be patient for now and work through this weight loss plateau. Will discuss ordering diabetic shoes with Chari Manning to hopefully improve neuropathy and increase activity. Will continue to closely monitor weight as we continue to increase insulin dose.   Next A1C anticipated October 2016.    Written patient instructions provided.  Total time in face to face counseling 20 minutes.   Follow up in Pharmacist Clinic Visit 2-4 weeks.

## 2015-09-17 ENCOUNTER — Telehealth: Payer: Self-pay | Admitting: Internal Medicine

## 2015-09-17 NOTE — Telephone Encounter (Signed)
Patient came in requesting Diabetic Shoes. If patients need appointment for Diabetic shoes can you please inform. Patient is best reached after 4:30 Please follow up.

## 2015-10-21 ENCOUNTER — Other Ambulatory Visit: Payer: Self-pay | Admitting: Internal Medicine

## 2015-12-26 MED FILL — AMLODIPINE BESYLATE 10 MG T: 10 | 30 days supply | Qty: 30 | Fill #5

## 2016-01-01 ENCOUNTER — Other Ambulatory Visit: Payer: Self-pay

## 2016-01-01 MED ORDER — HYDROCHLOROTHIAZIDE 25 MG PO TABS: 25.0000 mg | ORAL_TABLET | Freq: Every day | ORAL | Status: DC

## 2016-02-03 ENCOUNTER — Other Ambulatory Visit: Payer: Self-pay | Admitting: Internal Medicine

## 2016-02-03 DIAGNOSIS — N6310 Unspecified lump in the right breast, unspecified quadrant: Secondary | ICD-10-CM

## 2016-10-24 ENCOUNTER — Encounter (HOSPITAL_COMMUNITY): Payer: Self-pay

## 2016-10-24 ENCOUNTER — Encounter (HOSPITAL_COMMUNITY): Payer: Self-pay | Admitting: Emergency Medicine

## 2016-10-24 ENCOUNTER — Emergency Department (HOSPITAL_COMMUNITY): Payer: Self-pay

## 2016-10-24 ENCOUNTER — Ambulatory Visit (HOSPITAL_COMMUNITY)
Admission: EM | Admit: 2016-10-24 | Discharge: 2016-10-24 | Disposition: A | Discharge status: Home / Self Care | Payer: Self-pay | Attending: Emergency Medicine | Admitting: Emergency Medicine

## 2016-10-24 ENCOUNTER — Inpatient Hospital Stay (HOSPITAL_COMMUNITY)
Admission: EM | Admit: 2016-10-24 | Discharge: 2016-11-02 | DRG: 287 | Disposition: A | Discharge status: Home / Self Care | Payer: Self-pay | Attending: Family Medicine | Admitting: Family Medicine

## 2016-10-24 DIAGNOSIS — N183 Chronic kidney disease, stage 3 unspecified: Secondary | ICD-10-CM | POA: Diagnosis present

## 2016-10-24 DIAGNOSIS — E785 Hyperlipidemia, unspecified: Secondary | ICD-10-CM | POA: Diagnosis present

## 2016-10-24 DIAGNOSIS — E119 Type 2 diabetes mellitus without complications: Secondary | ICD-10-CM

## 2016-10-24 DIAGNOSIS — I5032 Chronic diastolic (congestive) heart failure: Secondary | ICD-10-CM | POA: Diagnosis present

## 2016-10-24 DIAGNOSIS — N179 Acute kidney failure, unspecified: Secondary | ICD-10-CM | POA: Diagnosis present

## 2016-10-24 DIAGNOSIS — I119 Hypertensive heart disease without heart failure: Secondary | ICD-10-CM | POA: Diagnosis present

## 2016-10-24 DIAGNOSIS — Z79899 Other long term (current) drug therapy: Secondary | ICD-10-CM

## 2016-10-24 DIAGNOSIS — N186 End stage renal disease: Secondary | ICD-10-CM

## 2016-10-24 DIAGNOSIS — I10 Essential (primary) hypertension: Secondary | ICD-10-CM

## 2016-10-24 DIAGNOSIS — G4730 Sleep apnea, unspecified: Secondary | ICD-10-CM | POA: Diagnosis present

## 2016-10-24 DIAGNOSIS — Z841 Family history of disorders of kidney and ureter: Secondary | ICD-10-CM

## 2016-10-24 DIAGNOSIS — Z87891 Personal history of nicotine dependence: Secondary | ICD-10-CM

## 2016-10-24 DIAGNOSIS — I130 Hypertensive heart and chronic kidney disease with heart failure and stage 1 through stage 4 chronic kidney disease, or unspecified chronic kidney disease: Secondary | ICD-10-CM | POA: Diagnosis present

## 2016-10-24 DIAGNOSIS — Z7982 Long term (current) use of aspirin: Secondary | ICD-10-CM

## 2016-10-24 DIAGNOSIS — I252 Old myocardial infarction: Secondary | ICD-10-CM

## 2016-10-24 DIAGNOSIS — I200 Unstable angina: Secondary | ICD-10-CM

## 2016-10-24 DIAGNOSIS — Z794 Long term (current) use of insulin: Secondary | ICD-10-CM

## 2016-10-24 DIAGNOSIS — R079 Chest pain, unspecified: Secondary | ICD-10-CM | POA: Diagnosis present

## 2016-10-24 DIAGNOSIS — I208 Other forms of angina pectoris: Secondary | ICD-10-CM

## 2016-10-24 DIAGNOSIS — Z9114 Patient's other noncompliance with medication regimen: Secondary | ICD-10-CM

## 2016-10-24 DIAGNOSIS — Z6841 Body Mass Index (BMI) 40.0 and over, adult: Secondary | ICD-10-CM

## 2016-10-24 DIAGNOSIS — N189 Chronic kidney disease, unspecified: Secondary | ICD-10-CM

## 2016-10-24 DIAGNOSIS — I2510 Atherosclerotic heart disease of native coronary artery without angina pectoris: Principal | ICD-10-CM | POA: Diagnosis present

## 2016-10-24 DIAGNOSIS — R9439 Abnormal result of other cardiovascular function study: Secondary | ICD-10-CM

## 2016-10-24 DIAGNOSIS — E669 Obesity, unspecified: Secondary | ICD-10-CM | POA: Diagnosis present

## 2016-10-24 DIAGNOSIS — E1122 Type 2 diabetes mellitus with diabetic chronic kidney disease: Secondary | ICD-10-CM | POA: Diagnosis present

## 2016-10-24 DIAGNOSIS — I161 Hypertensive emergency: Secondary | ICD-10-CM | POA: Diagnosis present

## 2016-10-24 DIAGNOSIS — E118 Type 2 diabetes mellitus with unspecified complications: Secondary | ICD-10-CM

## 2016-10-24 DIAGNOSIS — Z992 Dependence on renal dialysis: Secondary | ICD-10-CM

## 2016-10-24 DIAGNOSIS — I248 Other forms of acute ischemic heart disease: Secondary | ICD-10-CM | POA: Diagnosis present

## 2016-10-24 DIAGNOSIS — I5030 Unspecified diastolic (congestive) heart failure: Secondary | ICD-10-CM | POA: Diagnosis present

## 2016-10-24 DIAGNOSIS — Z833 Family history of diabetes mellitus: Secondary | ICD-10-CM

## 2016-10-24 DIAGNOSIS — Z8249 Family history of ischemic heart disease and other diseases of the circulatory system: Secondary | ICD-10-CM

## 2016-10-24 DIAGNOSIS — D72828 Other elevated white blood cell count: Secondary | ICD-10-CM | POA: Diagnosis present

## 2016-10-24 HISTORY — DX: Chronic diastolic (congestive) heart failure: I50.32

## 2016-10-24 LAB — BASIC METABOLIC PANEL
Anion gap: 9 (ref 5–15)
BUN: 16 mg/dL (ref 6–20)
CALCIUM: 8.5 mg/dL — AB (ref 8.9–10.3)
CHLORIDE: 103 mmol/L (ref 101–111)
CO2: 26 mmol/L (ref 22–32)
CREATININE: 1.84 mg/dL — AB (ref 0.44–1.00)
GFR calc non Af Amer: 30 mL/min — ABNORMAL LOW (ref >60)
GFR, EST AFRICAN AMERICAN: 35 mL/min — AB (ref >60)
GLUCOSE: 299 mg/dL — AB (ref 65–99)
Potassium: 4.3 mmol/L (ref 3.5–5.1)
Sodium: 138 mmol/L (ref 135–145)

## 2016-10-24 LAB — CBC
HCT: 39.8 % (ref 36.0–46.0)
Hemoglobin: 12.9 g/dL (ref 12.0–15.0)
MCH: 27.4 pg (ref 26.0–34.0)
MCHC: 32.4 g/dL (ref 30.0–36.0)
MCV: 84.5 fL (ref 78.0–100.0)
PLATELETS: 298 10*3/uL (ref 150–400)
RBC: 4.71 MIL/uL (ref 3.87–5.11)
RDW: 14 % (ref 11.5–15.5)
WBC: 11.8 10*3/uL — ABNORMAL HIGH (ref 4.0–10.5)

## 2016-10-24 LAB — BRAIN NATRIURETIC PEPTIDE: B Natriuretic Peptide: 144.9 pg/mL — ABNORMAL HIGH (ref 0.0–100.0)

## 2016-10-24 LAB — LIPID PANEL
CHOLESTEROL: 323 mg/dL — AB (ref 0–200)
HDL: 70 mg/dL (ref >40)
LDL CALC: 206 mg/dL — AB (ref 0–99)
TRIGLYCERIDES: 236 mg/dL — AB (ref <150)
Total CHOL/HDL Ratio: 4.6 RATIO
VLDL: 47 mg/dL — AB (ref 0–40)

## 2016-10-24 LAB — GLUCOSE, CAPILLARY: GLUCOSE-CAPILLARY: 246 mg/dL — AB (ref 65–99)

## 2016-10-24 LAB — I-STAT TROPONIN, ED: TROPONIN I, POC: 0.02 ng/mL (ref 0.00–0.08)

## 2016-10-24 LAB — TROPONIN I: TROPONIN I: 0.07 ng/mL — AB (ref <0.03)

## 2016-10-24 LAB — TSH: TSH: 2.33 u[IU]/mL (ref 0.350–4.500)

## 2016-10-24 MED ORDER — INSULIN ASPART 100 UNIT/ML ~~LOC~~ SOLN
3.0000 [IU] | Freq: Once | SUBCUTANEOUS | Status: AC
  Administered 2016-10-24: 3 [IU] via SUBCUTANEOUS

## 2016-10-24 MED ORDER — INFLUENZA VAC SPLIT QUAD 0.5 ML IM SUSY
0.5000 mL | PREFILLED_SYRINGE | INTRAMUSCULAR | Status: AC
  Administered 2016-10-25: 0.5 mL via INTRAMUSCULAR

## 2016-10-24 MED ORDER — CARVEDILOL 6.25 MG PO TABS
6.2500 mg | ORAL_TABLET | Freq: Two times a day (BID) | ORAL | Status: DC
  Administered 2016-10-24 – 2016-10-25 (×2): 6.25 mg via ORAL
  Filled 2016-10-24 (×2): qty 1

## 2016-10-24 MED ORDER — HYDRALAZINE HCL 20 MG/ML IJ SOLN
2.0000 mg | Freq: Four times a day (QID) | INTRAMUSCULAR | Status: DC | PRN
  Administered 2016-10-25: 2 mg via INTRAVENOUS
  Filled 2016-10-24: qty 1

## 2016-10-24 MED ORDER — NITROGLYCERIN 2 % TD OINT
1.0000 [in_us] | TOPICAL_OINTMENT | Freq: Once | TRANSDERMAL | Status: AC
  Administered 2016-10-24: 1 [in_us] via TOPICAL
  Filled 2016-10-24: qty 1

## 2016-10-24 MED ORDER — PNEUMOCOCCAL VAC POLYVALENT 25 MCG/0.5ML IJ INJ
0.5000 mL | INJECTION | INTRAMUSCULAR | Status: AC
  Administered 2016-10-28: 0.5 mL via INTRAMUSCULAR
  Filled 2016-10-24: qty 0.5

## 2016-10-24 MED ORDER — MORPHINE SULFATE (PF) 2 MG/ML IV SOLN
2.0000 mg | INTRAVENOUS | Status: DC | PRN
Start: 2016-10-24 — End: 2016-10-28

## 2016-10-24 MED ORDER — GI COCKTAIL ~~LOC~~: 30.0000 mL | Freq: Four times a day (QID) | ORAL | Status: DC | PRN

## 2016-10-24 MED ORDER — ATORVASTATIN CALCIUM 80 MG PO TABS
80.0000 mg | ORAL_TABLET | Freq: Every day | ORAL | Status: DC
  Administered 2016-10-25 – 2016-11-02 (×7): 80 mg via ORAL
  Filled 2016-10-24 (×10): qty 1

## 2016-10-24 MED ORDER — ENOXAPARIN SODIUM 40 MG/0.4ML ~~LOC~~ SOLN
40.0000 mg | SUBCUTANEOUS | Status: DC
  Administered 2016-10-24 – 2016-10-29 (×5): 40 mg via SUBCUTANEOUS
  Filled 2016-10-24 (×8): qty 0.4

## 2016-10-24 MED ORDER — ASPIRIN 81 MG PO CHEW
324.0000 mg | CHEWABLE_TABLET | Freq: Once | ORAL | Status: AC
  Administered 2016-10-24: 324 mg via ORAL
  Filled 2016-10-24: qty 4

## 2016-10-24 MED ORDER — ASPIRIN EC 81 MG PO TBEC
81.0000 mg | DELAYED_RELEASE_TABLET | Freq: Every day | ORAL | Status: DC
  Administered 2016-10-24 – 2016-11-02 (×9): 81 mg via ORAL
  Filled 2016-10-24 (×10): qty 1

## 2016-10-24 MED ORDER — ACETAMINOPHEN 325 MG PO TABS
650.0000 mg | ORAL_TABLET | ORAL | Status: DC | PRN
  Administered 2016-10-25 – 2016-10-26 (×2): 650 mg via ORAL
  Filled 2016-10-24 (×2): qty 2

## 2016-10-24 MED ORDER — LOSARTAN POTASSIUM 50 MG PO TABS: 50.0000 mg | ORAL_TABLET | Freq: Once | ORAL | Status: DC

## 2016-10-24 MED ORDER — HYDROCHLOROTHIAZIDE 25 MG PO TABS: 25.0000 mg | ORAL_TABLET | Freq: Every day | ORAL | Status: DC

## 2016-10-24 MED ORDER — INSULIN ASPART 100 UNIT/ML ~~LOC~~ SOLN
0.0000 [IU] | Freq: Three times a day (TID) | SUBCUTANEOUS | Status: DC
  Administered 2016-10-25: 3 [IU] via SUBCUTANEOUS
  Administered 2016-10-25 – 2016-10-27 (×8): 2 [IU] via SUBCUTANEOUS
  Administered 2016-10-28 – 2016-11-02 (×11): 1 [IU] via SUBCUTANEOUS

## 2016-10-24 MED ORDER — ONDANSETRON HCL 4 MG/2ML IJ SOLN
4.0000 mg | Freq: Four times a day (QID) | INTRAMUSCULAR | Status: DC | PRN
  Administered 2016-10-25 – 2016-10-26 (×2): 4 mg via INTRAVENOUS
  Filled 2016-10-24 (×3): qty 2

## 2016-10-24 MED ORDER — HYDRALAZINE HCL 20 MG/ML IJ SOLN
10.0000 mg | Freq: Once | INTRAMUSCULAR | Status: AC
  Administered 2016-10-24: 10 mg via INTRAVENOUS
  Filled 2016-10-24: qty 1

## 2016-10-24 NOTE — ED Triage Notes (Signed)
Tightness in chest around 2:00pm today.  Episode last approx 20 minutes.    Similar episode 2 years ago and told it was chf.  Patient is reluctant to go to ed.    Patient denies diaphoresis.  associated with diarrhea.  Patient reports sob.  Patient reports had difficulty talking at that time.

## 2016-10-24 NOTE — ED Notes (Signed)
ED Provider at bedside. 

## 2016-10-24 NOTE — ED Provider Notes (Signed)
[facility]    CSN: [id number] Arrival date & time: 10/24/16  1439     History   Chief Complaint Chief Complaint  Patient presents with  . Shortness of Breath    HPI Pamela Campbell is a 55 y.o. female.   HPI  She is a 55-year-old woman here for evaluation of shortness of breath. She has a history of diastolic heart failure, diabetes, and hypertension. She used to be on multiple medications, but is not currently taking anything. She does not have a primary care doctor and does not have insurance.  She reports an episode today of shortness of breath, chest tightness, and some diarrhea. She states she was walking up a hill to her house when she became quite short of breath, to the point where she could not talk. When she got up the steps into her house, she had an episode of explosive diarrhea. She also reports chest tightness and pressure during this time. No nausea or diaphoresis. Symptoms lasted approximately 20 minutes and resolved spontaneously. Currently, she denies any shortness of breath or chest pain.  Past Medical History:  Diagnosis Date  . CHF (congestive heart failure) (HCC)   . Diabetes mellitus without complication (HCC)   . Hypertension     Patient Active Problem List   Diagnosis Date Noted  . CKD (chronic kidney disease) stage 3, GFR 30-59 ml/min 07/17/2015  . HLD (hyperlipidemia) 07/17/2015  . Pneumonia   . Abnormal CXR 03/27/2015  . Type 2 diabetes mellitus (HCC) 03/27/2015  . Pneumonia, organism unspecified(486) 03/27/2015  . Chronic diastolic congestive heart failure (HCC) 03/25/2015  . Accelerated hypertension 03/25/2015  . Acute renal insufficiency 03/25/2015  . Obesity-BMI 42 03/25/2015  . Sleep apnea 03/25/2015  . Dyslipidemia 03/25/2015  . Elevated troponin-demand ischemia 03/24/2015  . Hypertensive heart disease 03/24/2015  . Cough 03/24/2015  . Shortness of breath 03/24/2015    Past Surgical History:  Procedure Laterality Date    . CESAREAN SECTION    . FRACTURE SURGERY    . TUBAL LIGATION      OB History    No data available       Home Medications    Prior to Admission medications   Medication Sig Start Date End Date Taking? Authorizing Provider  acetaminophen (TYLENOL) 325 MG tablet Take 2 tablets (650 mg total) by mouth every 4 (four) hours as needed for headache or mild pain. 03/25/15   Luke K Kilroy, PA-C  amLODipine (NORVASC) 10 MG tablet Take 1 tablet (10 mg total) by mouth daily. Patient not taking: Reported on 10/24/2016 07/15/15   Valerie A Keck, NP  aspirin 81 MG EC tablet Take 1 tablet (81 mg total) by mouth daily. Patient not taking: Reported on 08/30/2015 03/28/15   Bryan W Hager, PA-C  b complex vitamins tablet Take 1 tablet by mouth daily.    Historical Provider, MD  Blood Glucose Monitoring Suppl (TRUE METRIX METER) W/DEVICE KIT Check blood sugar 4 times daily AC and HS 05/27/15   Valerie A Keck, NP  carvedilol (COREG) 25 MG tablet TAKE 1 TABLET BY MOUTH 2 TIMES DAILY WITH A MEAL. Patient not taking: Reported on 10/24/2016 10/29/15   Valerie A Keck, NP  cholecalciferol (VITAMIN D) 1000 UNITS tablet Take 5,000 Units by mouth daily.    Historical Provider, MD  Digestive Enzymes (ENZYME DIGEST) CAPS Take 1 capsule by mouth daily.    Historical Provider, MD  glucose blood test strip 1 each by Other route as   needed for other. Use as instructed    Historical Provider, MD  glucose blood test strip Use as instructed 05/27/15   Valerie A Keck, NP  hydrochlorothiazide (HYDRODIURIL) 25 MG tablet Take 1 tablet (25 mg total) by mouth daily. Patient not taking: Reported on 10/24/2016 01/01/16   Valerie A Keck, NP  Insulin Glargine (LANTUS SOLOSTAR) 100 UNIT/ML Solostar Pen Inject 22 Units into the skin daily at 10 pm. 09/11/15   Olugbemiga E Jegede, MD  losartan (COZAAR) 100 MG tablet Take 1 tablet (100 mg total) by mouth daily. Patient not taking: Reported on 10/24/2016 03/29/15   Bryan W Hager, PA-C  milk thistle 175 MG  tablet Take 175 mg by mouth daily.    Historical Provider, MD  Multiple Vitamin (MULTIVITAMIN WITH MINERALS) TABS tablet Take 1 tablet by mouth daily.    Historical Provider, MD  nitroGLYCERIN (NITROSTAT) 0.4 MG SL tablet Place 1 tablet (0.4 mg total) under the tongue every 5 (five) minutes x 3 doses as needed for chest pain. 03/25/15   Luke K Kilroy, PA-C  pantoprazole (PROTONIX) 40 MG tablet Take 1 tablet (40 mg total) by mouth 2 (two) times daily before a meal. Patient not taking: Reported on 10/24/2016 03/28/15   Bryan W Hager, PA-C  Probiotic Product (PROBIOTIC DAILY) CAPS Take 1 capsule by mouth daily.    Historical Provider, MD  traMADol (ULTRAM) 50 MG tablet Take 1 tablet (50 mg total) by mouth every 8 (eight) hours as needed for pain. 05/08/13   Sarah L Weber, PA-C  TRUEPLUS LANCETS 30G MISC Check Blood sugar 4 times daily AC and HS 05/27/15   Valerie A Keck, NP    Family History Family History  Problem Relation Age of Onset  . Heart disease Father     Social History Social History  Substance Use Topics  . Smoking status: Former Smoker    Quit date: 03/22/1995  . Smokeless tobacco: Not on file  . Alcohol use No     Allergies   Vioxx [rofecoxib] and Compazine [prochlorperazine edisylate]   Review of Systems Review of Systems As in history of present illness  Physical Exam Triage Vital Signs ED Triage Vitals  Enc Vitals Group     BP      Pulse      Resp      Temp      Temp src      SpO2      Weight      Height      Head Circumference      Peak Flow      Pain Score      Pain Loc      Pain Edu?      Excl. in GC?    No data found.   Updated Vital Signs BP (!) 217/98 (BP Location: Right Arm)   Pulse 89   Temp 98.6 F (37 C) (Oral)   Resp 22   LMP 02/02/2015   SpO2 100%   Visual Acuity Right Eye Distance:   Left Eye Distance:   Bilateral Distance:    Right Eye Near:   Left Eye Near:    Bilateral Near:     Physical Exam  Constitutional: She is  oriented to person, place, and time. She appears well-developed and well-nourished. No distress.  Cardiovascular: Normal rate, regular rhythm and normal heart sounds.   No murmur heard. Pulmonary/Chest: Effort normal and breath sounds normal. She has no wheezes. She has no rales.  Neurological:   She is alert and oriented to person, place, and time.  Skin: She is not diaphoretic.     UC Treatments / Results  Labs (all labs ordered are listed, but only abnormal results are displayed) Labs Reviewed - No data to display  EKG  EKG Interpretation None       Radiology No results found.  Procedures ED EKG Date/Time: 10/24/2016 3:34 PM Performed by: HONIG, ERIN J Authorized by: HONIG, ERIN J   ECG reviewed by ED Physician in the absence of a cardiologist: yes   Previous ECG:    Previous ECG:  Compared to current   Similarity:  No change Interpretation:    Interpretation: abnormal   Rate:    ECG rate:  90   ECG rate assessment: normal   Rhythm:    Rhythm: sinus rhythm   Ectopy:    Ectopy: none   QRS:    QRS axis:  Normal Conduction:    Conduction: normal   ST segments:    ST segments:  Normal T waves:    T waves: inverted     Inverted:  V6 Other findings:    Other findings: poor R wave progression     (including critical care time)  Medications Ordered in UC Medications - No data to display   Initial Impression / Assessment and Plan / UC Course  I have reviewed the triage vital signs and the nursing notes.  Pertinent labs & imaging results that were available during my care of the patient were reviewed by me and considered in my medical decision making (see chart for details).  Clinical Course    History is very concerning for an exertional anginal episode. EKG is unchanged from prior. She is currently asymptomatic. She has multiple risk factors including hypertension, diabetes, hyperlipidemia, and diastolic heart dysfunction. She has not been on any  medication for several months. We'll transfer to [place] ED for additional cardiac evaluation and workup.  Final Clinical Impressions(s) / UC Diagnoses   Final diagnoses:  Exertional angina (HCC)    New Prescriptions New Prescriptions   No medications on file     Erin J Honig, MD 10/24/16 1536  

## 2016-10-24 NOTE — ED Notes (Signed)
Plan to decrease BP prior to transporting to 3E, confirmed with Palma Holter RN

## 2016-10-24 NOTE — ED Notes (Signed)
MD Isaccs made aware of patient BP >263 systolic.

## 2016-10-24 NOTE — ED Triage Notes (Signed)
Patient complains of chest tightness today that started at 2pm today lasting 20 minutes. Denies pain, no associated symptoms. Alert and oriented. NAD

## 2016-10-24 NOTE — H&P (Signed)
[facility] Admission History and Physical Service Pager: [phone]  Patient name: Pamela Campbell Medical record number: [id number] Date of birth: 07/30/1961 Age: 55 y.o. Gender: female  Primary Care Provider: No PCP Per Patient Consultants: Cardiology Code Status: Full  Chief Complaint: Chest tightness with dyspnea  Assessment and Plan: Pamela Campbell is a 55 y.o. female presenting with chest tightness and dyspnea on exertion. PMH is significant for untreated HTN, HLD, DM2, CKD3, HFpEF and ?NSTEMI (03/2016).  #Chest Pain, Acute, Resolved:  Resolved prior to arrival in ED.  Has h/o NSTEMI 03/2015 with similar presentation.  Heart score 5-6.  iTrop 0.02. sTrop 0.07 BNP 144.9. WBC 11.8. Lipid Tchol 323, HDL 70, LDL 206. Patient appears non-toxic on examination. Leukocytosis likely 2/2 stress reaction.  EKG with some nonspecific T wave flattening different compared to EKG on 03/2015.  CXR stable cardiomegaly, no pulmonary edema.  ASCVD risk 27%. Given lack of medical care and multiple uncontrolled co-morbidities, NSTEMI is of concern. Could likely be demand ischemia. Will need to closely monitor over next 24 hours. --Place in observation on tele, attending Dr. Nori Riis --Discussed care with cardiology fellow given h/o NSTEMI, says presentation consistent with demand iashemia and recommended we not start heparin at this time given resolved CP and neg troponin, formal consult to cards in AM w/ plan for possible stress test on 11/06 --Telemetry --Continuous pulse ox --VS per floor protocol --Repeat EKG in AM --Trend troponin --Risk stratification, TSH, A1c pending --F/u CBC, CMP in AM --Nitro paste given --Morphine 4 mg/mL IV q2h PRN severe pain --Coreg 6.25 mg PO BID --Aspirin 324 mg PO given --Zofran q6h PRN nausea --Hydralazine 2 mg IV q6h PRN BP SBP>180 or DBP>110 --Aspirin 81 mg QD  #Hypertensive Emergency, HTN. AoCKD and what looks to be demand ischemia.   Previously on ARB, BB, Thiazide BP in ED 215/117. She has not taken antihypertensives in 2 years. Given hydralazine down 185/90. --Coreg 6.25 mg BID --Will add Norvasc vs ACE-I/ARB (once AKI improved since has CKD3/DM2) as appropriate --Trend CMET in AM  #Acute on Chronic Kidney Disease, Stage 3, Uncontrolled: Cr 1.84 on admission. Baseline Cr 1.25-1.34. Injury likely related to uncontrolled HTN. --Avoid nephrotoxic agents --Conservative control of BP to avoid hypoperfusion to kidneys --Trend CMET in AM  #Diabetes Mellitus Type 2, Chronic, Uncontrolled:  Says she has no money and has not taken medication in over 2 years.  Previously on Lantus --SSI --CBG QAC and QHS --A1c pending  #Hyperlipidemia, Chronic, Uncontrolled:  Elevated total cholesterol 323, LDL 206, HDL70. ASCVD risk 27.0%. High intensity statin recommended. Risk factors include h/o possible NSTEMI, uncontrolled HTN, uncontrolled DM2, h/o smoking. +Paternal history of early MI (64). --Lipid panel pending --Lipitor 80 mg QD  #HFpEF, Chronic, Stable: Last echo 2016 EF 50-55%, moderate concentric hypertrophy, Grade 2 diastolic dysfunction, trivial aortic regurg, trivial mitral regurg. BNP 144.9, CXR no pulmonary edema --Repeat ECHO --Will need stress test on 11/06  #Sleep Apnea, Chronic, Uncontrolled: Per chart review. Patient denies she has this and notes that she does not use CPAP at home.   FEN/GI: Heart healthy/ Carb mod diet, saline lock Prophylaxis: Lovenox sub-q  Disposition: Pending ACS work up  History of Present Illness:  Pamela Campbell is a 55 y.o. female presenting with chest tightness and dyspnea on exertion. PMH is significant for untreated HTN, HLD, DM2, CKD3, HFpEF and ?NSTEMI (03/2016).  Patient notes feelings of chest tightness and dyspnea on 11/04 around 1400. Patient notes she  was on the telephone getting out of the vehicle when she experienced tightness in her chest which was exacerbated as she  walked to her house up a hill and up a flight of stairs. She continued to speak to her daughter on the phone but was having difficult completing sentences and requested her daughter to come help her. Patient suddenly had an intense sensation to defecate which was diarrheal. Symptoms gradually improved while she was waiting for her daughter to arrive. Daughter proceeded to bring patient to urgent care where she was seen and sent to [facility] ED for further evaluation. Patient has no h/o pulmonary issues and has no h/o catheterizations.  Denies nausea, vomiting, diaphoresis, dizziness, visual disturbance, LE edema. Patient has a 4 pack year history, former smoker and denies drug or ETOH use. Resides with son and independent of ADLs.  Review Of Systems: Per HPI with the following additions: none  ROS  Patient Active Problem List   Diagnosis Date Noted  . Hypertensive emergency 10/24/2016  . Chest pain 10/24/2016  . Acute-on-chronic kidney injury ([place]) 10/24/2016  . CKD (chronic kidney disease) stage 3, GFR 30-59 ml/min 07/17/2015  . HLD (hyperlipidemia) 07/17/2015  . Type 2 diabetes mellitus ([place]) 03/27/2015  . Chronic diastolic congestive heart failure ([place]) 03/25/2015  . Accelerated hypertension 03/25/2015  . Obesity-BMI 42 03/25/2015  . Sleep apnea 03/25/2015  . Dyslipidemia 03/25/2015  . Elevated troponin-demand ischemia 03/24/2015  . Hypertensive heart disease 03/24/2015  . Shortness of breath 03/24/2015    Past Medical History: Past Medical History:  Diagnosis Date  . CHF (congestive heart failure) ([place])   . Diabetes mellitus without complication ([place])   . Hypertension     Past Surgical History: Past Surgical History:  Procedure Laterality Date  . CESAREAN SECTION    . FRACTURE SURGERY    . TUBAL LIGATION      Social History: Social History  Substance Use Topics  . Smoking status: Former Smoker    Quit date: 03/22/1995  . Smokeless tobacco: Not on file  . Alcohol use No    Additional social history:  Denied illicit drug or EtOH use. Former smoker, 4 pack years. Please also refer to relevant sections of EMR.  Family History: Family History  Problem Relation Age of Onset  . Diabetes Mellitus II Mother   . Hypertension Mother   . Hyperlipidemia Mother   . Kidney disease Mother   . Heart disease Father   . Heart attack Father 90  . Congestive Heart Failure Brother   . Congestive Heart Failure Son    Allergies and Medications: Allergies  Allergen Reactions  . Vioxx [Rofecoxib] Other (See Comments)    Bleeding  . Compazine [Prochlorperazine Edisylate] Swelling   No current facility-administered medications on file prior to encounter.    Current Outpatient Prescriptions on File Prior to Encounter  Medication Sig Dispense Refill  . acetaminophen (TYLENOL) 325 MG tablet Take 2 tablets (650 mg total) by mouth every 4 (four) hours as needed for headache or mild pain.    Marland Kitchen amLODipine (NORVASC) 10 MG tablet Take 1 tablet (10 mg total) by mouth daily. (Patient not taking: Reported on 10/24/2016) 30 tablet 11  . aspirin 81 MG EC tablet Take 1 tablet (81 mg total) by mouth daily. (Patient not taking: Reported on 08/30/2015) 30 tablet   . b complex vitamins tablet Take 1 tablet by mouth daily.    . Blood Glucose Monitoring Suppl (TRUE METRIX METER) W/DEVICE KIT Check blood sugar 4  times daily AC and HS 1 kit 0  . carvedilol (COREG) 25 MG tablet TAKE 1 TABLET BY MOUTH 2 TIMES DAILY WITH A MEAL. (Patient not taking: Reported on 10/24/2016) 60 tablet 4  . cholecalciferol (VITAMIN D) 1000 UNITS tablet Take 5,000 Units by mouth daily.    . Digestive Enzymes (ENZYME DIGEST) CAPS Take 1 capsule by mouth daily.    Marland Kitchen glucose blood test strip 1 each by Other route as needed for other. Use as instructed    . glucose blood test strip Use as instructed 100 each 12  . hydrochlorothiazide (HYDRODIURIL) 25 MG tablet Take 1 tablet (25 mg total) by mouth daily. (Patient not taking:  Reported on 10/24/2016) 30 tablet 2  . Insulin Glargine (LANTUS SOLOSTAR) 100 UNIT/ML Solostar Pen Inject 22 Units into the skin daily at 10 pm. 15 mL 3  . losartan (COZAAR) 100 MG tablet Take 1 tablet (100 mg total) by mouth daily. (Patient not taking: Reported on 10/24/2016) 30 tablet 11  . milk thistle 175 MG tablet Take 175 mg by mouth daily.    . Multiple Vitamin (MULTIVITAMIN WITH MINERALS) TABS tablet Take 1 tablet by mouth daily.    . nitroGLYCERIN (NITROSTAT) 0.4 MG SL tablet Place 1 tablet (0.4 mg total) under the tongue every 5 (five) minutes x 3 doses as needed for chest pain. 25 tablet 2  . pantoprazole (PROTONIX) 40 MG tablet Take 1 tablet (40 mg total) by mouth 2 (two) times daily before a meal. (Patient not taking: Reported on 10/24/2016) 30 tablet 5  . Probiotic Product (PROBIOTIC DAILY) CAPS Take 1 capsule by mouth daily.    . traMADol (ULTRAM) 50 MG tablet Take 1 tablet (50 mg total) by mouth every 8 (eight) hours as needed for pain. 30 tablet 0  . TRUEPLUS LANCETS 30G MISC Check Blood sugar 4 times daily AC and HS (Patient not taking: Reported on 10/24/2016) 100 each 3    Objective: BP (!) 225/115   Pulse 72   Temp 98.2 F (36.8 C) (Oral)   Resp 21   LMP 02/02/2015   SpO2 95%  Exam: General: morbidly obese, well nourished, well developed, in no acute distress with non-toxic appearance HEENT: normocephalic, atraumatic, moist mucous membranes Neck: supple, non-tender without lymphadenopathy CV: regular rate and rhythm without murmurs, rubs, or gallops, no chest tenderness on palpation, no lower extremity pitting edema Lungs: clear to auscultation bilaterally with normal work of breathing Abdomen: soft, non-tender, no masses or organomegaly palpable, normoactive bowel sounds Skin: warm, dry, no rashes or lesions, cap refill < 2 seconds Extremities: warm and well perfused, normal tone Psych: mood stable, affect appropriate, speech normal Neuro: no focal deficits, moves all  extremities independently.  Labs and Imaging: CBC BMET   Recent Labs Lab 10/24/16 1537  WBC 11.8*  HGB 12.9  HCT 39.8  PLT 298    Recent Labs Lab 10/24/16 1537  NA 138  K 4.3  CL 103  CO2 26  BUN 16  CREATININE 1.84*  GLUCOSE 299*  CALCIUM 8.5*     Troponin: 0.02 BNP 144.9 (previous 170.6 in 2016) TSH: pend A1c: pend  Cardiac Panel (last 3 results)  Recent Labs  10/24/16 1822  TROPONINI 0.07*   Lipid Panel     Component Value Date/Time   CHOL 323 (H) 10/24/2016 1822   TRIG 236 (H) 10/24/2016 1822   HDL 70 10/24/2016 1822   CHOLHDL 4.6 10/24/2016 1822   VLDL 47 (H) 10/24/2016 1822  LDLCALC 206 (H) 10/24/2016 1822   Dg Chest 2 View  Result Date: 10/24/2016 CLINICAL DATA:  Initial evaluation for acute chest pain, chest tightness. EXAM: CHEST  2 VIEW COMPARISON:  Prior radiograph from 03/28/2015. FINDINGS: Cardiomegaly is stable from prior. Mediastinal silhouette within normal limits. Lungs normally inflated. Central vascular congestion without overt pulmonary edema. No pleural effusion. No focal infiltrates identified. No pneumothorax. No acute osseous abnormality. IMPRESSION: 1. Stable cardiomegaly with central perihilar vascular congestion without overt pulmonary edema. 2. No other active cardiopulmonary disease. Electronically Signed   By: Jeannine Boga M.D.   On: 10/24/2016 16:59   [place] Bing, DO 10/24/2016, 7:33 PM PGY-1, [place] Intern pager: [phone], text pages welcome  I have separately seen and examined the patient. I have discussed the findings and exam with Dr Yisroel Ramming and agree with the above note.  My changes/additions are outlined in BLUE.   Danyia Borunda M. Lajuana Ripple, DO PGY-3, [facility]

## 2016-10-24 NOTE — Discharge Summary (Signed)
[facility] Discharge Summary  Patient name: Pamela Campbell Medical record number: [id number] Date of birth: [birth year] Age: 55 y.o. Gender: female Date of Admission: 10/24/2016  Date of Discharge: 11/02/16 Admitting Physician: Dickie La, MD  Primary Care Provider: No PCP Per Patient Consultants: Cardiology  Indication for Hospitalization: chest tightness, dyspnea  Discharge Diagnoses/Problem List:  Principal Problem:   Chest pain with high risk for cardiac etiology Active Problems:   Hypertensive heart disease   Chronic diastolic congestive heart failure ([place])   Accelerated hypertension   Obesity-BMI 42   Sleep apnea   Dyslipidemia   Type 2 diabetes mellitus ([facility])   CKD (chronic kidney disease) stage 3, GFR 30-59 ml/min   Hypertensive emergency   Acute-on-chronic kidney injury ([place])   Acute kidney injury ([place])   Unstable angina pectoris ([place])   Essential hypertension   Coronary artery disease involving native coronary artery of native heart without angina pectoris   Abnormal stress test  Disposition: Home  Discharge Condition: Stable, improved  Discharge Exam:  General: Laying flat in bed, in NAD, pleasant, talkative.  HEENT: normocephalic, atraumatic, moist mucous membranes CV: regular rate and rhythm without murmurs, rubs, or gallops Lungs: Normal work of breathing, very mild crackles in lung bases bilaterally Skin: warm, dry, no rashes or lesions, cap refill < 2 seconds Extremities: Pitting edema to the mid shin.  Brief Hospital Course:  Pamela Campbell is a 55 y.o. female that presented to ED for chest tightness and dyspnea.  Upon arrival, her symptoms had resolved but she was noted to have a BP of 225/115 with evidence of end organ damage.  She had an acute on chronic kidney injury with Cr 1.84 and evidence of demand ischemia with serum troponin of 0.07 and nonspecific T wave changes on EKG.  Troponin trend remained stable at 0.07.  BNP  was noted to be elevated to 144.  Chest xray showed no evidence of pulmonary edema.  Patient clinically had no evidence of fluid overload and had no O2 requirement.  Risk stratification labs were remarkable for total cholesterol 323, LDL 203.  TSH 2.33, A1c 10.2.  She was started on a daily aspirin 81, Coreg 12.5 BID, and amplodipine 10 mg QD.  ASCVD risk 27.0%, so she was started on Lipitor 39m.  ACE-I/ARB were needed for renal protection, however, these were held given AoCKD.  Patient was normotensive at the time of discharge. Patient found to have many bacteria on urine micro despite neg UA and contaminated culture. Given AoCKD, patient was treated for bacteriuria on Keflex.  Cardiology was consulted who recommended that a stress test be performed.  Stress test showed no ST changes, with EF 52%.  Echocardiogram showed EF 55-60% with G2DD. Left heart cath showed mild non-obstructive CAD and an elevated LVEDP. Recommendations were given to start Lasix 40 mg BID in addition to other antihypertensives.  Additionally, care management was consulted to assist patient with obtaining medications and a PCP. Patient was discharged with close follow up with cardiology [facility]  Issues for Follow Up:  1. Medication compliance 2. Risk reduction: weight loss, BP control, DM2 control, lipid control 3. Pt was not taking Lantus prior to admission. Her A1c was 10.2%. We discharged her on Glipizide 564mdaily (we would prefer something like [place], but she doesn't have insurance- would recommend starting this if she gets insurance). Don't recommend Metformin with her GFR. 4. We held her HCTZ and Losartan  on discharge, because she had an elevated creatinine on the day of discharge and she received contrast with her cath. Recommend rechecking BMP at her follow-up appointment and restarting HCTZ and Losartan as appropriate. We continued her Norvasc and Coreg on discharge. 5. Cardiology  recommended starting Lasix 40 mg bid because she had pitting edema to her thighs. She was started on this medication on discharge. Please re-assess her volume status. Can decrease to Lasix 49m daily if appropriate.  Significant Procedures: L-heart catheterization  Significant Labs and Imaging:   Recent Labs Lab 10/28/16 0312 10/29/16 0300 11/02/16 0305  WBC 11.0* 10.9* 10.2  HGB 11.0* 11.1* 10.6*  HCT 34.4* 34.5* 32.6*  PLT 273 283 271    Recent Labs Lab 10/27/16 0449 10/28/16 0312 10/29/16 0300 10/30/16 0442 10/30/16 1500 10/31/16 0509 11/01/16 0347 11/02/16 0305  NA 139 137 137 138 137  --  136 136  K 4.1 4.0 4.0 3.7 3.8  --  4.2 3.5  CL 106 107 105 109 108  --  108 104  CO2 '26 22 24 ' 21* 20*  --  20* 23  GLUCOSE 158* 129* 148* 131* 122*  --  120* 140*  BUN 38* 37* 32* 36* 30*  --  31* 29*  CREATININE 2.52* 2.30* 2.04* 2.07* 1.99* 2.07* 2.13* 2.15*  CALCIUM 8.3* 8.6* 8.8* 8.3* 8.5*  --  8.4* 8.5*  PHOS 5.2* 4.2 3.8  --   --   --   --   --   ALBUMIN 2.3* 2.4* 2.3*  --   --   --   --   --     Cardiac Panel (last 3 results) No results for input(s): CKTOTAL, CKMB, TROPONINI, RELINDX in the last 72 hours. Lipid Panel     Component Value Date/Time   CHOL 323 (H) 10/24/2016 1822   TRIG 236 (H) 10/24/2016 1822   HDL 70 10/24/2016 1822   CHOLHDL 4.6 10/24/2016 1822   VLDL 47 (H) 10/24/2016 1822   LDLCALC 206 (H) 10/24/2016 1822   No results found. Results/Tests Pending at Time of Discharge: None  Discharge Medications:    Medication List    STOP taking these medications   hydrochlorothiazide 25 MG tablet Commonly known as:  HYDRODIURIL   Insulin Glargine 100 UNIT/ML Solostar Pen Commonly known as:  LANTUS SOLOSTAR   losartan 100 MG tablet Commonly known as:  COZAAR     TAKE these medications   acetaminophen 325 MG tablet Commonly known as:  TYLENOL Take 2 tablets (650 mg total) by mouth every 4 (four) hours as needed for headache or mild pain.    amLODipine 10 MG tablet Commonly known as:  NORVASC Take 1 tablet (10 mg total) by mouth daily.   aspirin 81 MG EC tablet Take 1 tablet (81 mg total) by mouth daily.   atorvastatin 80 MG tablet Commonly known as:  LIPITOR Take 1 tablet (80 mg total) by mouth daily at 6 PM.   b complex vitamins tablet Take 1 tablet by mouth daily.   carvedilol 25 MG tablet Commonly known as:  COREG TAKE 1 TABLET BY MOUTH 2 TIMES DAILY WITH A MEAL. What changed:  See the new instructions.   cholecalciferol 1000 units tablet Commonly known as:  VITAMIN D Take 5,000 Units by mouth daily.   Collagen 500 MG Caps Take 1,500 mg by mouth daily.   ENZYME DIGEST Caps Take 1 capsule by mouth daily.   Fish Oil 1000 MG Caps Take 2,000 mg by  mouth daily.   furosemide 40 MG tablet Commonly known as:  LASIX Take 1 tablet (40 mg total) by mouth 2 (two) times daily.   glipiZIDE 5 MG tablet Commonly known as:  GLUCOTROL Take 1 tablet (5 mg total) by mouth daily before breakfast.   glucose blood test strip 1 each by Other route as needed for other. Use as instructed   glucose blood test strip Use as instructed   milk thistle 175 MG tablet Take 175 mg by mouth daily.   multivitamin with minerals Tabs tablet Take 1 tablet by mouth daily.   nitroGLYCERIN 0.4 MG SL tablet Commonly known as:  NITROSTAT Place 1 tablet (0.4 mg total) under the tongue every 5 (five) minutes x 3 doses as needed for chest pain.   pantoprazole 40 MG tablet Commonly known as:  PROTONIX Take 1 tablet (40 mg total) by mouth 2 (two) times daily before a meal.   PROBIOTIC DAILY Caps Take 1 capsule by mouth daily.   traMADol 50 MG tablet Commonly known as:  ULTRAM Take 1 tablet (50 mg total) by mouth every 8 (eight) hours as needed for pain.   TRUE METRIX METER w/Device Kit Check blood sugar 4 times daily AC and HS   TRUEPLUS LANCETS 30G Misc Check Blood sugar 4 times daily AC and HS       Discharge  Instructions: Please refer to Patient Instructions section of EMR for full details.  Patient was counseled important signs and symptoms that should prompt return to medical care, changes in medications, dietary instructions, activity restrictions, and follow up appointments.   Follow-Up Appointments: Follow-up Information    Kerin Ransom, [place] On 11/09/2016.   Specialties:  Cardiology, Radiology Why:  at 1:30 for hospital follow up Contact information: [address] [place] [address] [place].   Why:  Please call on 11/03/2016 @ 9:00am for F/U appt. Contact information: [place] [id number] [place], DO 11/02/2016, 4:14 PM PGY-1, [place]

## 2016-10-24 NOTE — ED Notes (Signed)
Notified dr Bridgett Larsson of abnormal vital signs

## 2016-10-24 NOTE — ED Provider Notes (Signed)
[facility] DEPT Provider Note   CSN: [id number] Arrival date & time: 10/24/16  1545     History   Chief Complaint Chief Complaint  Patient presents with  . Chest Pain    HPI Pamela Campbell is a 55 y.o. female.  HPI 55 year old female with past medical history Hypertension, diabetes, CHF, and history of NSTEMI who presents with chest pain. Patient states that prior to arrival, she was walking up stairs when she experienced acute onset of shortness of breath. Patient states that she was getting off the phone after talking to her daughter when she began walking upstairs. She felt short of breath with a substernal chest pressure prior to walking up but then this acutely worsened while walking up the stairs. She developed a mild, dull, substernal chest pressure. She called her daughter and told her that she needed help. Her chest pressure and shortness of breath and resolved over the subsequent 20-30 minutes and have not recurred.  Past Medical History:  Diagnosis Date  . CHF (congestive heart failure) ([place])   . Diabetes mellitus without complication ([place])   . Hypertension     Patient Active Problem List   Diagnosis Date Noted  . Hypertensive emergency 10/24/2016  . Chest pain 10/24/2016  . Acute-on-chronic kidney injury ([place]) 10/24/2016  . CKD (chronic kidney disease) stage 3, GFR 30-59 ml/min 07/17/2015  . HLD (hyperlipidemia) 07/17/2015  . Type 2 diabetes mellitus ([place]) 03/27/2015  . Chronic diastolic congestive heart failure ([place]) 03/25/2015  . Accelerated hypertension 03/25/2015  . Obesity-BMI 42 03/25/2015  . Sleep apnea 03/25/2015  . Dyslipidemia 03/25/2015  . Elevated troponin-demand ischemia 03/24/2015  . Hypertensive heart disease 03/24/2015  . Shortness of breath 03/24/2015    Past Surgical History:  Procedure Laterality Date  . CESAREAN SECTION    . FRACTURE SURGERY    . TUBAL LIGATION      OB History    No data available       Home Medications     Prior to Admission medications   Medication Sig Start Date End Date Taking? Authorizing Provider  Collagen 500 MG CAPS Take 1,500 mg by mouth daily.   Yes Historical Provider, MD  Omega-3 Fatty Acids (FISH OIL) 1000 MG CAPS Take 2,000 mg by mouth daily.   Yes Historical Provider, MD  acetaminophen (TYLENOL) 325 MG tablet Take 2 tablets (650 mg total) by mouth every 4 (four) hours as needed for headache or mild pain. 03/25/15   Erlene Quan, PA-C  amLODipine (NORVASC) 10 MG tablet Take 1 tablet (10 mg total) by mouth daily. Patient not taking: Reported on 10/24/2016 07/15/15   Lance Bosch, NP  aspirin 81 MG EC tablet Take 1 tablet (81 mg total) by mouth daily. Patient not taking: Reported on 08/30/2015 03/28/15   Brett Canales, PA-C  b complex vitamins tablet Take 1 tablet by mouth daily.    Historical Provider, MD  Blood Glucose Monitoring Suppl (TRUE METRIX METER) W/DEVICE KIT Check blood sugar 4 times daily AC and HS 05/27/15   Lance Bosch, NP  carvedilol (COREG) 25 MG tablet TAKE 1 TABLET BY MOUTH 2 TIMES DAILY WITH A MEAL. Patient not taking: Reported on 10/24/2016 10/29/15   Lance Bosch, NP  cholecalciferol (VITAMIN D) 1000 UNITS tablet Take 5,000 Units by mouth daily.    Historical Provider, MD  Digestive Enzymes (ENZYME DIGEST) CAPS Take 1 capsule by mouth daily.    Historical Provider, MD  glucose blood test strip  1 each by Other route as needed for other. Use as instructed    Historical Provider, MD  glucose blood test strip Use as instructed 05/27/15   Lance Bosch, NP  hydrochlorothiazide (HYDRODIURIL) 25 MG tablet Take 1 tablet (25 mg total) by mouth daily. Patient not taking: Reported on 10/24/2016 01/01/16   Lance Bosch, NP  Insulin Glargine (LANTUS SOLOSTAR) 100 UNIT/ML Solostar Pen Inject 22 Units into the skin daily at 10 pm. 09/11/15   Tresa Garter, MD  losartan (COZAAR) 100 MG tablet Take 1 tablet (100 mg total) by mouth daily. Patient not taking: Reported on  10/24/2016 03/29/15   Brett Canales, PA-C  milk thistle 175 MG tablet Take 175 mg by mouth daily.    Historical Provider, MD  Multiple Vitamin (MULTIVITAMIN WITH MINERALS) TABS tablet Take 1 tablet by mouth daily.    Historical Provider, MD  nitroGLYCERIN (NITROSTAT) 0.4 MG SL tablet Place 1 tablet (0.4 mg total) under the tongue every 5 (five) minutes x 3 doses as needed for chest pain. 03/25/15   Erlene Quan, PA-C  pantoprazole (PROTONIX) 40 MG tablet Take 1 tablet (40 mg total) by mouth 2 (two) times daily before a meal. Patient not taking: Reported on 10/24/2016 03/28/15   Brett Canales, PA-C  Probiotic Product (PROBIOTIC DAILY) CAPS Take 1 capsule by mouth daily.    Historical Provider, MD  traMADol (ULTRAM) 50 MG tablet Take 1 tablet (50 mg total) by mouth every 8 (eight) hours as needed for pain. 05/08/13   Mancel Bale, PA-C  TRUEPLUS LANCETS 30G MISC Check Blood sugar 4 times daily AC and HS Patient not taking: Reported on 10/24/2016 05/27/15   Lance Bosch, NP    Family History Family History  Problem Relation Age of Onset  . Diabetes Mellitus II Mother   . Hypertension Mother   . Hyperlipidemia Mother   . Kidney disease Mother   . Heart disease Father   . Heart attack Father 61  . Congestive Heart Failure Brother   . Congestive Heart Failure Son     Social History Social History  Substance Use Topics  . Smoking status: Former Smoker    Quit date: 03/22/1995  . Smokeless tobacco: Not on file  . Alcohol use No     Allergies   Vioxx [rofecoxib] and Compazine [prochlorperazine edisylate]   Review of Systems Review of Systems  Constitutional: Positive for fatigue. Negative for chills and fever.  HENT: Negative for congestion, rhinorrhea and sore throat.   Eyes: Negative for visual disturbance.  Respiratory: Positive for shortness of breath. Negative for cough and wheezing.   Cardiovascular: Positive for chest pain. Negative for leg swelling.  Gastrointestinal: Negative for  abdominal pain, diarrhea, nausea and vomiting.  Genitourinary: Negative for dysuria, flank pain, vaginal bleeding and vaginal discharge.  Musculoskeletal: Negative for neck pain.  Skin: Negative for rash.  Allergic/Immunologic: Negative for immunocompromised state.  Neurological: Negative for syncope and headaches.  Hematological: Does not bruise/bleed easily.  All other systems reviewed and are negative.    Physical Exam Updated Vital Signs BP 136/69 (BP Location: Left Arm)   Pulse 77   Temp 98.6 F (37 C) (Oral)   Resp 18   Ht _0  (1.626 m)   Wt 262 lb 6.4 oz (119 kg)   LMP 02/02/2015   SpO2 94%   BMI 45.04 kg/m   Physical Exam  Constitutional: She is oriented to person, place, and time. She appears  well-developed and well-nourished. No distress.  HENT:  Head: Normocephalic and atraumatic.  Mouth/Throat: No oropharyngeal exudate.  Eyes: Conjunctivae are normal.  Neck: Neck supple.  Cardiovascular: Normal rate, regular rhythm and normal heart sounds.  Exam reveals no friction rub.   No murmur heard. Pulmonary/Chest: Effort normal and breath sounds normal. No respiratory distress. She has no wheezes. She has no rales.  Abdominal: She exhibits no distension.  Musculoskeletal: She exhibits no edema.  Neurological: She is alert and oriented to person, place, and time. She exhibits normal muscle tone.  Skin: Skin is warm. Capillary refill takes less than 2 seconds.  Psychiatric: She has a normal mood and affect.  Nursing note and vitals reviewed.    ED Treatments / Results  Labs (all labs ordered are listed, but only abnormal results are displayed) Labs Reviewed  BASIC METABOLIC PANEL - Abnormal; Notable for the following:       Result Value   Glucose, Bld 299 (*)    Creatinine, Ser 1.84 (*)    Calcium 8.5 (*)    GFR calc non Af Amer 30 (*)    GFR calc Af Amer 35 (*)    All other components within normal limits  CBC - Abnormal; Notable for the following:    WBC  11.8 (*)    All other components within normal limits  BRAIN NATRIURETIC PEPTIDE - Abnormal; Notable for the following:    B Natriuretic Peptide 144.9 (*)    All other components within normal limits  TROPONIN I - Abnormal; Notable for the following:    Troponin I 0.07 (*)    All other components within normal limits  TROPONIN I - Abnormal; Notable for the following:    Troponin I 0.08 (*)    All other components within normal limits  LIPID PANEL - Abnormal; Notable for the following:    Cholesterol 323 (*)    Triglycerides 236 (*)    VLDL 47 (*)    LDL Cholesterol 206 (*)    All other components within normal limits  GLUCOSE, CAPILLARY - Abnormal; Notable for the following:    Glucose-Capillary 246 (*)    All other components within normal limits  TSH  TROPONIN I  HEMOGLOBIN A1C  I-STAT TROPOININ, ED    EKG  EKG Interpretation  Date/Time:  Saturday October 24 2016 15:53:26 EDT Ventricular Rate:  90 PR Interval:  142 QRS Duration: 80 QT Interval:  376 QTC Calculation: 459 R Axis:   23 Text Interpretation:  Normal sinus rhythm Anteroseptal infarct , age undetermined T wave abnormality, consider inferior ischemia Abnormal ECG No significant change since last tracing, although TWI are new from 4/16 tracing Confirmed by Lysbeth Dicola MD, Bilbo Carcamo [phone]) on 10/25/2016 1:05:09 AM       Radiology Dg Chest 2 View  Result Date: 10/24/2016 CLINICAL DATA:  Initial evaluation for acute chest pain, chest tightness. EXAM: CHEST  2 VIEW COMPARISON:  Prior radiograph from 03/28/2015. FINDINGS: Cardiomegaly is stable from prior. Mediastinal silhouette within normal limits. Lungs normally inflated. Central vascular congestion without overt pulmonary edema. No pleural effusion. No focal infiltrates identified. No pneumothorax. No acute osseous abnormality. IMPRESSION: 1. Stable cardiomegaly with central perihilar vascular congestion without overt pulmonary edema. 2. No other active cardiopulmonary  disease. Electronically Signed   By: Jeannine Boga M.D.   On: 10/24/2016 16:59    Procedures Procedures (including critical care time)  Medications Ordered in ED Medications  acetaminophen (TYLENOL) tablet 650 mg (not administered)  ondansetron (ZOFRAN)  injection 4 mg (not administered)  insulin aspart (novoLOG) injection 0-9 Units (not administered)  enoxaparin (LOVENOX) injection 40 mg (40 mg Subcutaneous Given 10/24/16 2333)  morphine 2 MG/ML injection 2 mg (not administered)  gi cocktail (Maalox,Lidocaine,Donnatal) (not administered)  aspirin EC tablet 81 mg (81 mg Oral Given 10/24/16 2333)  carvedilol (COREG) tablet 6.25 mg (6.25 mg Oral Given 10/24/16 1934)  hydrALAZINE (APRESOLINE) injection 2 mg (not administered)  atorvastatin (LIPITOR) tablet 80 mg (80 mg Oral Not Given 10/24/16 2334)  Influenza vac split quadrivalent PF (FLUARIX) injection 0.5 mL (not administered)  pneumococcal 23 valent vaccine (PNU-IMMUNE) injection 0.5 mL (not administered)  aspirin chewable tablet 324 mg (324 mg Oral Given 10/24/16 1756)  nitroGLYCERIN (NITROGLYN) 2 % ointment 1 inch (1 inch Topical Given 10/24/16 1823)  hydrALAZINE (APRESOLINE) injection 10 mg (10 mg Intravenous Given 10/24/16 1920)  insulin aspart (novoLOG) injection 3 Units (3 Units Subcutaneous Given 10/24/16 2333)     Initial Impression / Assessment and Plan / ED Course  I have reviewed the triage vital signs and the nursing notes.  Pertinent labs & imaging results that were available during my care of the patient were reviewed by me and considered in my medical decision making (see chart for details).  Clinical Course    55 yo F with PMHx of HTN, HLD, obesity, strong family h/o CAD, h/o dCHF who p/w typical, exertional CP. Now CP free. EKG shows no acute ST elevations or depressions, and CP is resolved. Primary suspicion is unstable angina versus NSTEMI. Pain is not sharp, tearing, and do not suspect dissection. CXR is wnl. No  LE edema or signs of DVT/PE. Initial lab work reviewed as above - labs show hyperglycemia, likely CKD, and mild elevation of BNP. Trop negative x 1. Will admit to IM service for management of high risk CP. Of note, pt markedly HTN - likely 2/2 non-adherence, will give dose of hydralazing, PO antiHTN meds here. No vision changes or signs of HTN emergency.  Final Clinical Impressions(s) / ED Diagnoses   Final diagnoses:  Chest pain with high risk for cardiac etiology  Essential hypertension    New Prescriptions Current Discharge Medication List       Duffy Bruce, MD 10/25/16 1019

## 2016-10-25 ENCOUNTER — Encounter (HOSPITAL_COMMUNITY): Payer: Self-pay

## 2016-10-25 DIAGNOSIS — N179 Acute kidney failure, unspecified: Secondary | ICD-10-CM

## 2016-10-25 DIAGNOSIS — R072 Precordial pain: Secondary | ICD-10-CM

## 2016-10-25 DIAGNOSIS — N183 Chronic kidney disease, stage 3 (moderate): Secondary | ICD-10-CM

## 2016-10-25 DIAGNOSIS — I10 Essential (primary) hypertension: Secondary | ICD-10-CM

## 2016-10-25 LAB — GLUCOSE, CAPILLARY
GLUCOSE-CAPILLARY: 193 mg/dL — AB (ref 65–99)
Glucose-Capillary: 193 mg/dL — ABNORMAL HIGH (ref 65–99)
Glucose-Capillary: 223 mg/dL — ABNORMAL HIGH (ref 65–99)
Glucose-Capillary: 181 mg/dL — ABNORMAL HIGH (ref 65–99)

## 2016-10-25 LAB — CBC
HCT: 36 % (ref 36.0–46.0)
Hemoglobin: 11.6 g/dL — ABNORMAL LOW (ref 12.0–15.0)
MCH: 27.1 pg (ref 26.0–34.0)
MCHC: 32.2 g/dL (ref 30.0–36.0)
MCV: 84.1 fL (ref 78.0–100.0)
PLATELETS: 288 10*3/uL (ref 150–400)
RBC: 4.28 MIL/uL (ref 3.87–5.11)
RDW: 14.1 % (ref 11.5–15.5)
WBC: 12.2 10*3/uL — ABNORMAL HIGH (ref 4.0–10.5)

## 2016-10-25 LAB — BASIC METABOLIC PANEL
Anion gap: 5 (ref 5–15)
BUN: 24 mg/dL — ABNORMAL HIGH (ref 6–20)
CALCIUM: 8.3 mg/dL — AB (ref 8.9–10.3)
CO2: 25 mmol/L (ref 22–32)
CREATININE: 2.11 mg/dL — AB (ref 0.44–1.00)
Chloride: 107 mmol/L (ref 101–111)
GFR calc non Af Amer: 25 mL/min — ABNORMAL LOW (ref >60)
GFR, EST AFRICAN AMERICAN: 29 mL/min — AB (ref >60)
GLUCOSE: 193 mg/dL — AB (ref 65–99)
Potassium: 4.4 mmol/L (ref 3.5–5.1)
Sodium: 137 mmol/L (ref 135–145)

## 2016-10-25 LAB — TROPONIN I
Troponin I: 0.08 ng/mL (ref <0.03)
Troponin I: 0.07 ng/mL (ref <0.03)

## 2016-10-25 MED ORDER — SODIUM CHLORIDE 0.9 % IV BOLUS (SEPSIS)
500.0000 mL | Freq: Once | INTRAVENOUS | Status: AC
  Administered 2016-10-25: 500 mL via INTRAVENOUS

## 2016-10-25 MED ORDER — CARVEDILOL 12.5 MG PO TABS
12.5000 mg | ORAL_TABLET | Freq: Two times a day (BID) | ORAL | Status: DC
  Administered 2016-10-25 – 2016-11-02 (×16): 12.5 mg via ORAL
  Filled 2016-10-25 (×17): qty 1

## 2016-10-25 MED ORDER — SODIUM CHLORIDE 0.9 % IV SOLN
INTRAVENOUS | Status: AC
  Administered 2016-10-25: 08:00:00 via INTRAVENOUS
  Filled 2016-10-25: qty 1000

## 2016-10-25 MED ORDER — AMLODIPINE BESYLATE 5 MG PO TABS
5.0000 mg | ORAL_TABLET | Freq: Every day | ORAL | Status: DC
  Administered 2016-10-25 – 2016-10-27 (×3): 5 mg via ORAL
  Filled 2016-10-25 (×3): qty 1

## 2016-10-25 MED ORDER — SODIUM CHLORIDE 0.9 % IV BOLUS (SEPSIS)
500.0000 mL | Freq: Once | INTRAVENOUS | Status: DC
Start: 2016-10-25 — End: 2016-10-25

## 2016-10-25 MED ORDER — LOPERAMIDE HCL 2 MG PO CAPS
2.0000 mg | ORAL_CAPSULE | Freq: Once | ORAL | Status: AC
  Administered 2016-10-25: 2 mg via ORAL
  Filled 2016-10-25: qty 1

## 2016-10-25 MED ORDER — HYDRALAZINE HCL 20 MG/ML IJ SOLN
5.0000 mg | INTRAMUSCULAR | Status: DC | PRN
  Administered 2016-10-25 – 2016-10-28 (×3): 5 mg via INTRAVENOUS
  Filled 2016-10-25 (×2): qty 1

## 2016-10-25 NOTE — Consult Note (Addendum)
Cardiology Consult    Patient ID: EILA RUNYAN MRN: [id number], DOB/AGE: Jan 09, 1961   Admit date: 10/24/2016 Date of Consult: 10/25/2016  Primary Physician: No PCP Per Patient Reason for Consult: Chest Pain Primary Cardiologist: Dr. Oval Linsey 03/2015  Requesting Provider: Dr. Nori Riis   History of Present Illness    Alannis Hsia Mckeel is a 55 y.o. female with past medical history of chronic diastolic CHF, HTN, HLD, Type 2 DM, and Stage 3 CKD who presented to [name] ED on 10/24/2016 for evaluation of chest pain.  While in the ED, her BP was initially at 215/117 (treated with IV Hydralazine). Reported not taking any antihypertensive medications for the past 1+ years.   Initial labs showed a WBC of 11.8, Hgb 12.9, and platelets 298. K+ 4.3. Creatinine 1.84. BNP 144. Lipid Panel showing total cholesterol 323, triglycerides 236, HDL 70, and LDL 206. Cyclic troponin values have been 0.07, 0.08, and 0.07. EKG shows NSR, HR 90, with TWI in inferolateral leads.   Echocardiogram performed in 03/2015 showed EF of 50-55%, no WMA, Grade 2 DD, trivial AI, and mild MR.   Past Medical History   Past Medical History:  Diagnosis Date  . Chronic diastolic CHF (congestive heart failure) ([place])    a. 03/2015: echo w/ EF of 50-55%, no WMA, Grade 2 DD, trivial AI, mild MR.  . Diabetes mellitus without complication ([place])   . Hypertension     Past Surgical History:  Procedure Laterality Date  . CESAREAN SECTION    . FRACTURE SURGERY    . TUBAL LIGATION       Allergies  Allergies  Allergen Reactions  . Vioxx [Rofecoxib] Other (See Comments)    Bleeding  . Compazine [Prochlorperazine Edisylate] Swelling    Inpatient Medications    . aspirin EC  81 mg Oral Daily  . atorvastatin  80 mg Oral q1800  . carvedilol  6.25 mg Oral BID WC  . enoxaparin (LOVENOX) injection  40 mg Subcutaneous Q24H  . Influenza vac split quadrivalent PF  0.5 mL Intramuscular Tomorrow-1000  . insulin aspart  0-9 Units  Subcutaneous TID WC  . pneumococcal 23 valent vaccine  0.5 mL Intramuscular Tomorrow-1000    Family History    Family History  Problem Relation Age of Onset  . Diabetes Mellitus II Mother   . Hypertension Mother   . Hyperlipidemia Mother   . Kidney disease Mother   . Heart disease Father   . Heart attack Father 30  . Congestive Heart Failure Brother   . Congestive Heart Failure Son     Social History    Social History   Social History  . Marital status: Married    Spouse name: N/A  . Number of children: N/A  . Years of education: N/A   Occupational History  . Not on file.   Social History Main Topics  . Smoking status: Former Smoker    Quit date: 03/22/1995  . Smokeless tobacco: Never Used  . Alcohol use No  . Drug use: No  . Sexual activity: No   Other Topics Concern  . Not on file   Social History Narrative  . No narrative on file     Review of Systems    General:  No chills, fever, night sweats or weight changes.  Cardiovascular:  No chest pain, dyspnea on exertion, edema, orthopnea, palpitations, paroxysmal nocturnal dyspnea. Dermatological: No rash, lesions/masses Respiratory: No cough, dyspnea Urologic: No hematuria, dysuria Abdominal:   No nausea,  vomiting, diarrhea, bright red blood per rectum, melena, or hematemesis Neurologic:  No visual changes, wkns, changes in mental status. All other systems reviewed and are otherwise negative except as noted above.  Physical Exam    Blood pressure (!) 161/91, pulse 75, temperature 98.6 F (37 C), temperature source Oral, resp. rate 20, height 5\' 4"  (1.626 m), weight 262 lb 14.4 oz (119.3 kg), last menstrual period 02/02/2015, SpO2 94 %.  General: Pleasant, obese NAD Psych: Normal affect. Neuro: Alert and oriented X 3. Moves all extremities spontaneously. HEENT: Normal  Neck: Supple without bruits or JVD. Lungs:  Resp regular and unlabored, CTA. Heart: RRR no s3, s4, or murmurs. Abdomen: Soft,  non-tender, non-distended, BS + x 4.  Extremities: No clubbing, cyanosis or edema. DP/PT/Radials 2+ and equal bilaterally.  Labs    Troponin [facility] Test)  Recent Labs  10/24/16 1605  TROPIPOC 0.02    Recent Labs  10/24/16 1822 10/24/16 2322 10/25/16 0618  TROPONINI 0.07* 0.08* 0.07*   Lab Results  Component Value Date   WBC 12.2 (H) 10/25/2016   HGB 11.6 (L) 10/25/2016   HCT 36.0 10/25/2016   MCV 84.1 10/25/2016   PLT 288 10/25/2016     Recent Labs Lab 10/25/16 0618  NA 137  K 4.4  CL 107  CO2 25  BUN 24*  CREATININE 2.11*  CALCIUM 8.3*  GLUCOSE 193*   Lab Results  Component Value Date   CHOL 323 (H) 10/24/2016   HDL 70 10/24/2016   LDLCALC 206 (H) 10/24/2016   TRIG 236 (H) 10/24/2016   Lab Results  Component Value Date   DDIMER 0.58 (H) 03/25/2015     Radiology Studies    Dg Chest 2 View  Result Date: 10/24/2016 CLINICAL DATA:  Initial evaluation for acute chest pain, chest tightness. EXAM: CHEST  2 VIEW COMPARISON:  Prior radiograph from 03/28/2015. FINDINGS: Cardiomegaly is stable from prior. Mediastinal silhouette within normal limits. Lungs normally inflated. Central vascular congestion without overt pulmonary edema. No pleural effusion. No focal infiltrates identified. No pneumothorax. No acute osseous abnormality. IMPRESSION: 1. Stable cardiomegaly with central perihilar vascular congestion without overt pulmonary edema. 2. No other active cardiopulmonary disease. Electronically Signed   By: Jeannine Boga M.D.   On: 10/24/2016 16:59    EKG & Cardiac Imaging    EKG: NSR, HR 90, with TWI in inferolateral leads.   Echocardiogram: 03/2015 Study Conclusions  - Left ventricle: The cavity size was normal. There was moderate concentric hypertrophy. Systolic function was normal. The estimated ejection fraction was in the range of 50% to 55%. Wall motion was normal; there were no regional wall motion abnormalities. Features  are consistent with a pseudonormal left ventricular filling pattern, with concomitant abnormal relaxation and increased filling pressure (grade 2 diastolic dysfunction). - Aortic valve: There was trivial regurgitation. - Mitral valve: There was mild regurgitation. - Left atrium: The atrium was mildly dilated.  Assessment & Plan    Signed, Erma Heritage, PA-C 10/25/2016, 8:52 AM Pager: [phone] As above, patient seen and examined. Briefly she is a 55 year old female with past medical history of hypertension, diabetes mellitus, hyperlipidemia, chronic stage III kidney disease for evaluation of hypertensive urgency, dyspnea and chest pain. Patient got out of her car yesterday and developed chest tightness without radiation. No associated nausea, vomiting or diaphoresis. The pain was not pleuritic or positional. She also noticed dyspnea. Her symptoms lasted 20 minutes and resolved. She otherwise has not had dyspnea on exertion, orthopnea,  PND, exertional chest pain or syncope. Initial systolic blood pressure 732. Pulse 88. Laboratories show BNP 145, troponin 0.07, 0.08 and 0.07. LDL 206. Electrocardiogram shows sinus rhythm with lateral T-wave inversion.  1 chest pain-symptoms are somewhat atypical but multiple risk factors. Troponins are not diagnostic of acute coronary syndrome. Plan nuclear study tomorrow a.m. for risk stratification. Await echo results.  2 hypertension-blood pressure is elevated. Continue carvedilol but increase to 12.5 mg twice a day. Add amlodipine 5 mg daily. Follow blood pressure and adjust regimen as needed. Note she apparently was on Coreg 25 mg twice a day, amlodipine 10 mg daily, Cozaar 100 mg daily and hydrochlorothiazide 25 mg daily at home; however not clearly compliant.  3 acute on chronic stage III kidney disease-we'll hold on HCTZ and ARB for now. Would add later has renal function stabilizes.  4 severe hyperlipidemia-agree with Lipitor 80 mg daily.  Check lipids and liver in 4 weeks.  5 DM  Kirk Ruths, MD\

## 2016-10-25 NOTE — Progress Notes (Signed)
New Admission Note:   Arrival Method: Bed Mental Orientation: AOX4 Telemetry:Box 38 Assessment: Completed Skin: Intact IV: RAC Pain:0/10  Safety Measures: Safety Fall Prevention Plan has been discussed  Admission:  [facility] Orientation: Patient has been orientated to the room, unit and staff.  Family:   Orders to be reviewed and implemented. Will continue to monitor the patient. Call light has been placed within reach and bed alarm has been activated.   Nicki Furlan, RN Phone:

## 2016-10-25 NOTE — Progress Notes (Signed)
Pt Troponin 0.07, no s/s, MD notified, will continue to monitor, Thanks Arvella Nigh RN.

## 2016-10-25 NOTE — Progress Notes (Signed)
Troponin level came back 0.08. No signs or symptoms at this time. Patient stable. No complains of pain. MD notified  Venetia Night, RN

## 2016-10-25 NOTE — Progress Notes (Signed)
Patient 235/111. Coreg 12.5  and Hydralazine 2 mg. PRN  given.Troponin 0.07.  Patient is asymptomatic Rechecked B/P 186/91. MD notified.

## 2016-10-25 NOTE — Progress Notes (Signed)
Family Medicine Teaching Service Daily Progress Note Intern Pager: [phone]  Patient name: Pamela Campbell Medical record number: [id number] Date of birth: 1961/12/04 Age: 55 y.o. Gender: female  Primary Care Provider: No PCP Per Patient Consultants: Cardiology Code Status: FULL (discussed on admission)  Pt Overview and Major Events to Date:  11/5: Admitted  Assessment and Plan: Pamela Campbell is a 55 y.o. female presenting with chest tightness and dyspnea on exertion. PMH is significant for untreated HTN, HLD, DM2, CKD3, HFpEF and ?NSTEMI (03/2016).  #Chest Pain, Acute, Resolved: Continues to remain stable and asymptomatic.  Heart score 5-6.  trop 0.07>0.08. Repeat EKG with continued nonspecific T wave changes.  TSH 2.330 - c/s cardiology.  Discussed patient with Dr Stanford Breed.  He will see this morning.  Appreciate recommendations.  - Telemetry - Continuous pulse ox - VS per floor protocol - Trend troponin -  A1c pending - Morphine 4 mg/mL IV q2h PRN severe pain, Coreg 6.25 mg PO BID, Zofran q6h PRN nausea, Aspirin 81 mg QD  #Hypertensive Emergency, HTN. BP 161/91 this am. AoCKD and what looks to be demand ischemia.  Previously on ARB, BB, Thiazide. She has not taken antihypertensives in 2 years.  - Coreg 6.25 mg BID - Will add Norvasc vs ACE-I/ARB (once AKI improved since has CKD3/DM2) as appropriate - repeat BMP in AM  #Acute on Chronic Kidney Disease, Stage 3, Uncontrolled: Cr 2.11 this am. Baseline Cr 1.25-1.34. Injury likely related to uncontrolled HTN. - Avoid nephrotoxic agents - Conservative control of BP to avoid hypoperfusion to kidneys - BMP in am - Gentle hydration NS @75cc /h x8 hours  #Diabetes Mellitus Type 2, Chronic, Uncontrolled: Says she has no money and has not taken medication in over 2 years.  Previously on Lantus - sSSI - CBG QAC and QHS - A1c pending - c/s CM for assistance with meds/ PCP  #Hyperlipidemia, Chronic, Uncontrolled:  Elevated total  cholesterol 323, LDL 206, HDL70. ASCVD risk 27.0%. High intensity statin recommended. Risk factors include h/o possible NSTEMI, uncontrolled HTN, uncontrolled DM2, h/o smoking. +Paternal history of early MI (64). - Lipitor 80 mg QD  #HFpEF, Chronic, Stable: Last echo 2016 EF 50-55%, moderate concentric hypertrophy, Grade 2 diastolic dysfunction, trivial aortic regurg, trivial mitral regurg. BNP 144.9, CXR no pulmonary edema - Update ECHO - Will need stress test on 11/06  #Sleep Apnea, Chronic, Uncontrolled: Per chart review. Patient denies she has this and notes that she does not use CPAP at home.  FEN/GI: Heart healthy/ Carb mod diet, gentle hydration x8 hours Prophylaxis: Lovenox sub-q  Disposition: Pending ACS work up  Subjective:  Patient reports that she is feeling well today.  Denies chest tightness, dyspnea, visual disturbance.  She reports nausea without vomiting around 3 am today.  Tolerated a sandwich last evening without difficulty.  Objective: Temp:  [98 F (36.7 C)-98.6 F (37 C)] 98.6 F (37 C) (11/05 0517) Pulse Rate:  [72-95] 75 (11/05 0517) Resp:  [18-23] 20 (11/05 0517) BP: (136-225)/(69-120) 161/91 (11/05 0517) SpO2:  [94 %-100 %] 94 % (11/05 0517) Weight:  [262 lb 6.4 oz (119 kg)-262 lb 14.4 oz (119.3 kg)] 262 lb 14.4 oz (119.3 kg) (11/05 0517) Physical Exam: General: resting in bed, NAD Cardiovascular: RRR, no murmurs, +2pulses Respiratory: CTAB, normal WOB on room air Abdomen: obese, soft, NT/ND, +BS Extremities: WWP, no edema  Laboratory:  Recent Labs Lab 10/24/16 1537 10/25/16 0618  WBC 11.8* 12.2*  HGB 12.9 11.6*  HCT 39.8 36.0  PLT 298  288    Recent Labs Lab 10/24/16 1537 10/25/16 0618  NA 138 137  K 4.3 4.4  CL 103 107  CO2 26 25  BUN 16 24*  CREATININE 1.84* 2.11*  CALCIUM 8.5* 8.3*  GLUCOSE 299* 193*    Cardiac Panel (last 3 results)  Recent Labs  10/24/16 1822 10/24/16 2322 10/25/16 0618  TROPONINI 0.07* 0.08* 0.07*    Lipid Panel     Component Value Date/Time   CHOL 323 (H) 10/24/2016 1822   TRIG 236 (H) 10/24/2016 1822   HDL 70 10/24/2016 1822   CHOLHDL 4.6 10/24/2016 1822   VLDL 47 (H) 10/24/2016 1822   LDLCALC 206 (H) 10/24/2016 1822    Imaging/Diagnostic Tests:  Janora Norlander, DO 10/25/2016, 7:17 AM PGY-3, [place] Intern pager: [phone], text pages welcome

## 2016-10-26 ENCOUNTER — Observation Stay (HOSPITAL_COMMUNITY): Payer: Self-pay

## 2016-10-26 DIAGNOSIS — R0600 Dyspnea, unspecified: Secondary | ICD-10-CM

## 2016-10-26 DIAGNOSIS — R079 Chest pain, unspecified: Secondary | ICD-10-CM

## 2016-10-26 DIAGNOSIS — N186 End stage renal disease: Secondary | ICD-10-CM

## 2016-10-26 DIAGNOSIS — Z992 Dependence on renal dialysis: Secondary | ICD-10-CM

## 2016-10-26 DIAGNOSIS — I200 Unstable angina: Secondary | ICD-10-CM

## 2016-10-26 LAB — NM MYOCAR MULTI W/SPECT W/WALL MOTION / EF
CHL CUP STRESS STAGE 1 DBP: 102 mmHg
CHL CUP STRESS STAGE 1 HR: 78 {beats}/min
CHL CUP STRESS STAGE 1 SBP: 174 mmHg
CHL CUP STRESS STAGE 1 SPEED: 0 mph
CHL CUP STRESS STAGE 2 GRADE: 0 %
CHL CUP STRESS STAGE 3 DBP: 90 mmHg
CHL CUP STRESS STAGE 3 HR: 83 {beats}/min
CHL CUP STRESS STAGE 4 GRADE: 0 %
CHL CUP STRESS STAGE 4 SPEED: 0 mph
LVDIAVOL: 126 mL (ref 46–106)
LVSYSVOL: 61 mL
NUC STRESS TID: 1.01
RATE: 0.2
SDS: 8
SRS: 1
SSS: 9
Stage 1 Grade: 0 %
Stage 2 HR: 78 {beats}/min
Stage 2 Speed: 0 mph
Stage 3 Grade: 0 %
Stage 3 SBP: 120 mmHg
Stage 3 Speed: 0 mph
Stage 4 DBP: 86 mmHg
Stage 4 HR: 80 {beats}/min
Stage 4 SBP: 124 mmHg

## 2016-10-26 LAB — RENAL FUNCTION PANEL
ANION GAP: 7 (ref 5–15)
Albumin: 2.4 g/dL — ABNORMAL LOW (ref 3.5–5.0)
BUN: 32 mg/dL — AB (ref 6–20)
CHLORIDE: 106 mmol/L (ref 101–111)
CO2: 24 mmol/L (ref 22–32)
Calcium: 8.5 mg/dL — ABNORMAL LOW (ref 8.9–10.3)
Creatinine, Ser: 2.49 mg/dL — ABNORMAL HIGH (ref 0.44–1.00)
GFR calc Af Amer: 24 mL/min — ABNORMAL LOW (ref >60)
GFR calc non Af Amer: 21 mL/min — ABNORMAL LOW (ref >60)
GLUCOSE: 173 mg/dL — AB (ref 65–99)
PHOSPHORUS: 5.3 mg/dL — AB (ref 2.5–4.6)
POTASSIUM: 4.2 mmol/L (ref 3.5–5.1)
Sodium: 137 mmol/L (ref 135–145)

## 2016-10-26 LAB — URINALYSIS, ROUTINE W REFLEX MICROSCOPIC
BILIRUBIN URINE: NEGATIVE
Glucose, UA: 250 mg/dL — AB
KETONES UR: NEGATIVE mg/dL
LEUKOCYTES UA: NEGATIVE
NITRITE: NEGATIVE
Specific Gravity, Urine: 1.026 (ref 1.005–1.030)
pH: 5.5 (ref 5.0–8.0)

## 2016-10-26 LAB — CBC
HEMATOCRIT: 36.7 % (ref 36.0–46.0)
HEMOGLOBIN: 11.6 g/dL — AB (ref 12.0–15.0)
MCH: 26.9 pg (ref 26.0–34.0)
MCHC: 31.6 g/dL (ref 30.0–36.0)
MCV: 85.2 fL (ref 78.0–100.0)
Platelets: 285 10*3/uL (ref 150–400)
RBC: 4.31 MIL/uL (ref 3.87–5.11)
RDW: 14.2 % (ref 11.5–15.5)
WBC: 11.4 10*3/uL — ABNORMAL HIGH (ref 4.0–10.5)

## 2016-10-26 LAB — GLUCOSE, CAPILLARY
GLUCOSE-CAPILLARY: 192 mg/dL — AB (ref 65–99)
Glucose-Capillary: 169 mg/dL — ABNORMAL HIGH (ref 65–99)
Glucose-Capillary: 193 mg/dL — ABNORMAL HIGH (ref 65–99)
Glucose-Capillary: 190 mg/dL — ABNORMAL HIGH (ref 65–99)

## 2016-10-26 LAB — URINE MICROSCOPIC-ADD ON

## 2016-10-26 LAB — CREATININE, URINE, RANDOM: CREATININE, URINE: 178.29 mg/dL

## 2016-10-26 LAB — SODIUM, URINE, RANDOM: SODIUM UR: 29 mmol/L

## 2016-10-26 MED ORDER — REGADENOSON 0.4 MG/5ML IV SOLN
INTRAVENOUS | Status: AC
  Administered 2016-10-26: 0.4 mg via INTRAVENOUS
  Filled 2016-10-26: qty 5

## 2016-10-26 MED ORDER — REGADENOSON 0.4 MG/5ML IV SOLN
0.4000 mg | Freq: Once | INTRAVENOUS | Status: AC
  Administered 2016-10-26: 0.4 mg via INTRAVENOUS
  Filled 2016-10-26: qty 5

## 2016-10-26 MED ORDER — SODIUM CHLORIDE 0.9 % IV SOLN
INTRAVENOUS | Status: DC
  Administered 2016-10-27 – 2016-10-28 (×2): via INTRAVENOUS
  Administered 2016-10-29: 1000 mL via INTRAVENOUS

## 2016-10-26 MED ORDER — TECHNETIUM TC 99M TETROFOSMIN IV KIT: 30.0000 | PACK | Freq: Once | INTRAVENOUS | Status: DC | PRN

## 2016-10-26 MED ORDER — SODIUM CHLORIDE 0.9 % IV SOLN
INTRAVENOUS | Status: DC
  Administered 2016-10-26: 11:00:00 via INTRAVENOUS

## 2016-10-26 MED ORDER — TECHNETIUM TC 99M TETROFOSMIN IV KIT
10.0000 | PACK | Freq: Once | INTRAVENOUS | Status: AC | PRN
  Administered 2016-10-26: 10 via INTRAVENOUS

## 2016-10-26 MED ORDER — SODIUM CHLORIDE 0.9 % IV SOLN: INTRAVENOUS | Status: DC

## 2016-10-26 MED ORDER — HYDRALAZINE HCL 20 MG/ML IJ SOLN
INTRAMUSCULAR | Status: AC
  Filled 2016-10-26: qty 1

## 2016-10-26 NOTE — Progress Notes (Signed)
*  PRELIMINARY RESULTS* Echocardiogram 2D Echocardiogram has been performed.  Leavy Cella 10/26/2016, 3:07 PM

## 2016-10-26 NOTE — Progress Notes (Signed)
Patient alarmed 3 beats vtach. Pt. Asymptomatic. MD on call paged.  Will continue to monitor.

## 2016-10-26 NOTE — Progress Notes (Signed)
Patient's nurse Lauren informed of elevated BP prior to [facility] and that she had 5mg  of IV Hydralazine at 0906. Also aware of most recent BP's

## 2016-10-26 NOTE — Progress Notes (Signed)
    I have reviewed the myoview study  I agree with the intrepretation The ant. Defect is likely due to shifting  breast artifact .  Her presentation was exertional chest tightness.  I think she needs a cath but her creatinine has increased from 1.8 to 2.5 over the past 2 days. Will have to gently hydrate her  Anticipate cath in several days as her renal function improves.     Mertie Moores, MD  10/26/2016 5:35 PM    [facility],  [place], [place]  [address] Pager [phone] Phone: [phone]; Fax: [phone]

## 2016-10-26 NOTE — Progress Notes (Signed)
Patient Name: Pamela Campbell Date of Encounter: 10/26/2016  Primary Cardiologist: Dr [facility] Problem List     Principal Problem:   Chest pain Active Problems:   Chronic diastolic congestive heart failure (HCC)   Accelerated hypertension   Hypertensive heart disease   Type 2 diabetes mellitus (HCC)   Obesity-BMI 42   Sleep apnea   Dyslipidemia   CKD (chronic kidney disease) stage 3, GFR 30-59 ml/min   Hypertensive emergency   Acute-on-chronic kidney injury ([place])     Subjective   No chest pain or SOB this am  Inpatient Medications    Scheduled Meds: . amLODipine  5 mg Oral Daily  . aspirin EC  81 mg Oral Daily  . atorvastatin  80 mg Oral q1800  . carvedilol  12.5 mg Oral BID WC  . enoxaparin (LOVENOX) injection  40 mg Subcutaneous Q24H  . insulin aspart  0-9 Units Subcutaneous TID WC  . pneumococcal 23 valent vaccine  0.5 mL Intramuscular Tomorrow-1000   Continuous Infusions: . sodium chloride     PRN Meds: acetaminophen, gi cocktail, hydrALAZINE, morphine injection, ondansetron (ZOFRAN) IV   Vital Signs    Vitals:   10/26/16 0939 10/26/16 0942 10/26/16 0944 10/26/16 0946  BP: (!) 174/102 (!) 179/98 120/90 124/86  Pulse:      Resp:      Temp:      TempSrc:      SpO2:      Weight:      Height:        Intake/Output Summary (Last 24 hours) at 10/26/16 0956 Last data filed at 10/26/16 0737  Gross per 24 hour  Intake            847.5 ml  Output             2200 ml  Net          -1352.5 ml   Filed Weights   10/24/16 2045 10/25/16 0517 10/26/16 1497  Weight: 262 lb 6.4 oz (119 kg) 262 lb 14.4 oz (119.3 kg) 265 lb 3.2 oz (120.3 kg)    Physical Exam    GEN: Morbidly obese female in no acute distress.  HEENT: Grossly normal.  Neck: Supple, no JVD, carotid bruits, or masses. Cardiac: RRR, no murmurs, rubs, or gallops. No clubbing, cyanosis, edema.  Radials/DP/PT 2+ and equal bilaterally.  Respiratory:  Respirations regular and unlabored,  clear to auscultation bilaterally. MS: no deformity or atrophy. Skin: warm and dry, no rash. Neuro:  Strength and sensation are intact. Psych: AAOx3.  Normal affect.  Labs    CBC  Recent Labs  10/25/16 0618 10/26/16 0433  WBC 12.2* 11.4*  HGB 11.6* 11.6*  HCT 36.0 36.7  MCV 84.1 85.2  PLT 288 026   Basic Metabolic Panel  Recent Labs  10/25/16 0618 10/26/16 0433  NA 137 137  K 4.4 4.2  CL 107 106  CO2 25 24  GLUCOSE 193* 173*  BUN 24* 32*  CREATININE 2.11* 2.49*  CALCIUM 8.3* 8.5*  PHOS  --  5.3*   Liver Function Tests  Recent Labs  10/26/16 0433  ALBUMIN 2.4*   No results for input(s): LIPASE, AMYLASE in the last 72 hours. Cardiac Enzymes  Recent Labs  10/24/16 1822 10/24/16 2322 10/25/16 0618  TROPONINI 0.07* 0.08* 0.07*   BNP Invalid input(s): POCBNP D-Dimer No results for input(s): DDIMER in the last 72 hours. Hemoglobin A1C No results for input(s): HGBA1C in the last 72 hours.  Fasting Lipid Panel  Recent Labs  10/24/16 1822  CHOL 323*  HDL 70  LDLCALC 206*  TRIG 236*  CHOLHDL 4.6   Thyroid Function Tests  Recent Labs  10/24/16 1822  TSH 2.330    Telemetry    CV strips from 10/24/16-NSR- Personally Reviewed  ECG    NSR, TWI 2,3,F, V5-V6, septal Q V2- Personally Reviewed  Radiology    Dg Chest 2 View  Result Date: 10/24/2016 CLINICAL DATA:  Initial evaluation for acute chest pain, chest tightness. EXAM: CHEST  2 VIEW COMPARISON:  Prior radiograph from 03/28/2015. FINDINGS: Cardiomegaly is stable from prior. Mediastinal silhouette within normal limits. Lungs normally inflated. Central vascular congestion without overt pulmonary edema. No pleural effusion. No focal infiltrates identified. No pneumothorax. No acute osseous abnormality. IMPRESSION: 1. Stable cardiomegaly with central perihilar vascular congestion without overt pulmonary edema. 2. No other active cardiopulmonary disease. Electronically Signed   By: Jeannine Boga M.D.   On: 10/24/2016 16:59    Cardiac Studies   Lexiscan Myoview results pending  Patient Profile     Morbidly obese AA female with a history of chronic diastolic CHF, HTN, HLD, Type 2 DM, and Stage 3 CKD who presented to [name] ED on 10/24/2016 for evaluation of chest pain. She was hypertensive in the ED, reported non compliance with antihypertensive medications for the past 1+ years. Cyclic troponin values have been 0.07, 0.08, and 0.07.    Assessment & Plan    1 chest pain-symptoms are somewhat atypical but multiple risk factors. Troponins are not diagnostic of acute coronary syndrome. Nuclear stress this am- results pending.  2 hypertension-blood pressure is elevated. Continue medications as ordered, hydralazine PRN.   3 acute on chronic stage III kidney disease-we'll hold on HCTZ and ARB for now. Would add later has renal function stabilizes.  4 severe hyperlipidemia-TSH WNL.  Lipitor 80 mg daily added. Check lipids and liver in 4 weeks.  5 Type 2 IDDM-poor control.  Plan: Lexsican Myoview done this am- final results pending.   Signed, Kerin Ransom, PA-C  10/26/2016, 9:56 AM   Attending Note:   The patient was seen and examined.  Agree with assessment and plan as noted above.  Changes made to the above note as needed.  Patient seen and independently examined with Kerin Ransom, PA .   We discussed all aspects of the encounter. I agree with the assessment and plan as stated above.  Pt presents with exertional chest tightness.   Symptoms are somewhat worrisome for CAD She has gained weight recently   Troponin levels are elevated but flat  - not entirely c/w an ACS  agree with myoview     I have spent a total of 40 minutes with patient reviewing hospital  notes , telemetry, EKGs, labs and examining patient as well as establishing an assessment and plan that was discussed with the patient. > 50% of time was spent in direct patient care.    Thayer Headings, Brooke Bonito., MD, [facility] 10/26/2016, 5:52 PM [address],  [place] Pager [phone]

## 2016-10-26 NOTE — Progress Notes (Signed)
Family Medicine Teaching Service Daily Progress Note Intern Pager: [phone]  Patient name: Pamela Campbell Medical record number: [id number] Date of birth: 04-04-1961 Age: 55 y.o. Gender: female  Primary Care Provider: No PCP Per Patient Consultants: Cardiology Code Status: FULL (discussed on admission)  Pt Overview and Major Events to Date:  11/05: Admitted for ACS r/o 11/06: Nuclear stress performed  Assessment and Plan: Pamela Campbell is a 55 y.o. female presenting with chest tightness and dyspnea on exertion. PMH is significant for untreated HTN, HLD, DM2, CKD3, HFpEF and NSTEMI (03/2016).  #Chest Pain, Acute, Resolved:  Continues to remain stable and asymptomatic. Heart score 5-6.  trop 0.07>0.08>0.07. Repeat EKG with continued nonspecific T wave changes. TSH 2.330. --Cardiology following, appreciate recommendations --Nuclear study performed, pending interpretation --Increased Coreg 12.5 mg BID, added Amlodipine 5 mg QD, and Atorvastatin 80 mg QD per cards rec  --Telemetry --Continuous pulse ox --A1c pending --Morphine 4 mg/mL IV q2h PRN severe pain --Zofran q6h PRN nausea --Aspirin 81 mg QD  #Hypertensive Emergency, HTN: BP 190s/110s this am. AoCKD and what looks to be demand ischemia.  Previously on ARB, BB, Thiazide. She has not taken antihypertensives in 2 years.  --Coreg 12.5 mg BID --Amlodipine 5 mg QD --Consider ACE-I/ARB (once AKI improved since has CKD3/DM2) as appropriate outpatient --Trend repeat BMP in AM  #Acute on Chronic Kidney Disease, Stage 3, Uncontrolled:  Cr increasing to 2.49 as of 11/06. Baseline Cr 1.25-1.34. Injury likely related to uncontrolled HTN. --Avoid nephrotoxic agents --Conservative control of BP to avoid hypoperfusion to kidneys --Trend repeat BMP in AM --Gentle hydration at 1/2 maintenance, NS @80cc /h x24 hours  #Diabetes Mellitus Type 2, Chronic, Uncontrolled:  Says she has no money and has not taken medication in over 2 years.  Previously on Lantus. --sSSI --CBG QAC and QHS --A1c pending --C/s CM for assistance with meds/ PCP  #Hyperlipidemia, Chronic, Uncontrolled:   Elevated total cholesterol 323, LDL 206, HDL70. ASCVD risk 27.0%. High intensity statin recommended. Risk factors include h/o possible NSTEMI, uncontrolled HTN, uncontrolled DM2, h/o smoking. +Paternal history of early MI (15). --Lipitor 80 mg QD  #HFpEF, Chronic, Stable:  Last echo 2016 EF 50-55%, moderate concentric hypertrophy, Grade 2 diastolic dysfunction, trivial aortic regurg, trivial mitral regurg. BNP 144.9, CXR no pulmonary edema --Pending read nuclear stress as above  #Sleep Apnea, Chronic, Uncontrolled:  Per chart review. Patient denies she has this and notes that she does not use CPAP at home.  FEN/GI: Heart healthy/ Carb mod diet, gentle hydration x8 hours Prophylaxis: Lovenox sub-q  Disposition: Pending ACS work up  Subjective:  Patient reports that she is feeling well today.  Denies chest tightness, dyspnea, visual disturbance.  She reports nausea without vomiting today since the nuclear study but has improved.  Tolerating lunch and comfortable.  Objective: Temp:  [98.2 F (36.8 C)-98.8 F (37.1 C)] 98.5 F (36.9 C) (11/06 9024) Pulse Rate:  [77-99] 87 (11/06 0632) Resp:  [20] 20 (11/06 0973) BP: (151-235)/(71-117) 164/83 (11/06 5329) SpO2:  [93 %-100 %] 95 % (11/06 9242) Weight:  [265 lb 3.2 oz (120.3 kg)] 265 lb 3.2 oz (120.3 kg) (11/06 6834) Physical Exam: General: resting in bed, NAD Cardiovascular: RRR, no murmurs, +2pulses Respiratory: CTAB, normal WOB on room air Abdomen: obese, soft, NT/ND, +BS Extremities: WWP, no edema  Laboratory:  Recent Labs Lab 10/24/16 1537 10/25/16 0618 10/26/16 0433  WBC 11.8* 12.2* 11.4*  HGB 12.9 11.6* 11.6*  HCT 39.8 36.0 36.7  PLT 298 288 285  Recent Labs Lab 10/24/16 1537 10/25/16 0618 10/26/16 0433  NA 138 137 137  K 4.3 4.4 4.2  CL 103 107 106  CO2 26  25 24   BUN 16 24* 32*  CREATININE 1.84* 2.11* 2.49*  CALCIUM 8.5* 8.3* 8.5*  GLUCOSE 299* 193* 173*    Cardiac Panel (last 3 results)  Recent Labs  10/24/16 1822 10/24/16 2322 10/25/16 0618  TROPONINI 0.07* 0.08* 0.07*   Lipid Panel     Component Value Date/Time   CHOL 323 (H) 10/24/2016 1822   TRIG 236 (H) 10/24/2016 1822   HDL 70 10/24/2016 1822   CHOLHDL 4.6 10/24/2016 1822   VLDL 47 (H) 10/24/2016 1822   LDLCALC 206 (H) 10/24/2016 1822    Imaging/Diagnostic Tests: DG Chest 2 View (10/24/16) FINDINGS: Cardiomegaly is stable from prior. Mediastinal silhouette within normal limits.  Lungs normally inflated. Central vascular congestion without overt pulmonary edema. No pleural effusion. No focal infiltrates identified. No pneumothorax.  No acute osseous abnormality.  IMPRESSION: 1. Stable cardiomegaly with central perihilar vascular congestion without overt pulmonary edema. 2. No other active cardiopulmonary disease.    [place] Bing, DO 10/26/2016, 7:09 AM PGY-1, [place] Intern pager: [phone], text pages welcome

## 2016-10-27 DIAGNOSIS — I5032 Chronic diastolic (congestive) heart failure: Secondary | ICD-10-CM

## 2016-10-27 LAB — CBC
HEMATOCRIT: 34.1 % — AB (ref 36.0–46.0)
HEMOGLOBIN: 10.7 g/dL — AB (ref 12.0–15.0)
MCH: 27.1 pg (ref 26.0–34.0)
MCHC: 31.4 g/dL (ref 30.0–36.0)
MCV: 86.3 fL (ref 78.0–100.0)
Platelets: 259 10*3/uL (ref 150–400)
RBC: 3.95 MIL/uL (ref 3.87–5.11)
RDW: 14.4 % (ref 11.5–15.5)
WBC: 10.4 10*3/uL (ref 4.0–10.5)

## 2016-10-27 LAB — RENAL FUNCTION PANEL
ALBUMIN: 2.3 g/dL — AB (ref 3.5–5.0)
ANION GAP: 7 (ref 5–15)
BUN: 38 mg/dL — AB (ref 6–20)
CHLORIDE: 106 mmol/L (ref 101–111)
CO2: 26 mmol/L (ref 22–32)
Calcium: 8.3 mg/dL — ABNORMAL LOW (ref 8.9–10.3)
Creatinine, Ser: 2.52 mg/dL — ABNORMAL HIGH (ref 0.44–1.00)
GFR, EST AFRICAN AMERICAN: 24 mL/min — AB (ref >60)
GFR, EST NON AFRICAN AMERICAN: 21 mL/min — AB (ref >60)
Glucose, Bld: 158 mg/dL — ABNORMAL HIGH (ref 65–99)
PHOSPHORUS: 5.2 mg/dL — AB (ref 2.5–4.6)
POTASSIUM: 4.1 mmol/L (ref 3.5–5.1)
Sodium: 139 mmol/L (ref 135–145)

## 2016-10-27 LAB — GLUCOSE, CAPILLARY
GLUCOSE-CAPILLARY: 129 mg/dL — AB (ref 65–99)
Glucose-Capillary: 165 mg/dL — ABNORMAL HIGH (ref 65–99)
Glucose-Capillary: 173 mg/dL — ABNORMAL HIGH (ref 65–99)
Glucose-Capillary: 157 mg/dL — ABNORMAL HIGH (ref 65–99)

## 2016-10-27 LAB — ECHOCARDIOGRAM COMPLETE
HEIGHTINCHES: 64 in
WEIGHTICAEL: 4243.2 [oz_av]

## 2016-10-27 LAB — GRAM STAIN

## 2016-10-27 MED ORDER — CEPHALEXIN 500 MG PO CAPS
500.0000 mg | ORAL_CAPSULE | Freq: Two times a day (BID) | ORAL | Status: DC
  Administered 2016-10-27 – 2016-10-29 (×5): 500 mg via ORAL
  Filled 2016-10-27 (×6): qty 1

## 2016-10-27 NOTE — Progress Notes (Signed)
Hospital Problem List     Principal Problem:   Chest pain Active Problems:   Hypertensive heart disease   Chronic diastolic congestive heart failure (HCC)   Accelerated hypertension   Obesity-BMI 42   Sleep apnea   Dyslipidemia   Type 2 diabetes mellitus (HCC)   CKD (chronic kidney disease) stage 3, GFR 30-59 ml/min   Hypertensive emergency   Acute-on-chronic kidney injury ([place])   Acute kidney injury ([place])   Unstable angina pectoris [facility])     Patient Profile:   Morbidly obese AA female with a history of chronic diastolic CHF, HTN, HLD, Type 2 DM, and Stage 3 CKD who presented to [name] ED on 10/24/2016 for evaluation of chest pain. She was hypertensive in the ED, reported non compliance with antihypertensive medications for the past 1+ years. Cyclic troponin values have been 0.07, 0.08, and 0.07.    Subjective   No events overnight. Doing well this morning. Denies any chest pain or shortness of breath. Anxious to get out of the hospital.  Inpatient Medications    . amLODipine  5 mg Oral Daily  . aspirin EC  81 mg Oral Daily  . atorvastatin  80 mg Oral q1800  . carvedilol  12.5 mg Oral BID WC  . enoxaparin (LOVENOX) injection  40 mg Subcutaneous Q24H  . insulin aspart  0-9 Units Subcutaneous TID WC  . pneumococcal 23 valent vaccine  0.5 mL Intramuscular Tomorrow-1000    Vital Signs    Vitals:   10/26/16 1117 10/26/16 1938 10/27/16 0036 10/27/16 0624  BP: (!) 161/81 128/64 124/64 133/79  Pulse: 79 71 73 71  Resp: 20 18 18 18   Temp: 98.1 F (36.7 C) 98 F (36.7 C) 98.9 F (37.2 C) 98.6 F (37 C)  TempSrc: Oral Oral Oral Oral  SpO2: 100% 96% 96% 96%  Weight:    270 lb 4.8 oz (122.6 kg)  Height:        Intake/Output Summary (Last 24 hours) at 10/27/16 0917 Last data filed at 10/27/16 0753  Gross per 24 hour  Intake           1274.5 ml  Output             1400 ml  Net           -125.5 ml   Filed Weights   10/25/16 0517 10/26/16 3154 10/27/16 0624    Weight: 262 lb 14.4 oz (119.3 kg) 265 lb 3.2 oz (120.3 kg) 270 lb 4.8 oz (122.6 kg)    Physical Exam    General: Well developed, well nourished, female appearing in no acute distress. Head: Normocephalic, atraumatic.  Neck: Supple without bruits, JVD. Lungs:  Resp regular and unlabored, CTA. Heart: RRR, S1, S2, no S3, S4, or murmur; no rub. Abdomen: Soft, non-tender, non-distended with normoactive bowel sounds. No hepatomegaly. No rebound/guarding. No obvious abdominal masses. Extremities: No clubbing, cyanosis, edema. Distal pedal pulses are 2+ bilaterally. Neuro: Alert and oriented X 3. Moves all extremities spontaneously. Psych: Normal affect.  Labs    CBC  Recent Labs  10/26/16 0433 10/27/16 0449  WBC 11.4* 10.4  HGB 11.6* 10.7*  HCT 36.7 34.1*  MCV 85.2 86.3  PLT 285 008   Basic Metabolic Panel  Recent Labs  10/26/16 0433 10/27/16 0449  NA 137 139  K 4.2 4.1  CL 106 106  CO2 24 26  GLUCOSE 173* 158*  BUN 32* 38*  CREATININE 2.49* 2.52*  CALCIUM 8.5* 8.3*  PHOS 5.3* 5.2*   Liver Function Tests  Recent Labs  10/26/16 0433 10/27/16 0449  ALBUMIN 2.4* 2.3*   No results for input(s): LIPASE, AMYLASE in the last 72 hours. Cardiac Enzymes  Recent Labs  10/24/16 1822 10/24/16 2322 10/25/16 0618  TROPONINI 0.07* 0.08* 0.07*   BNP Invalid input(s): POCBNP D-Dimer No results for input(s): DDIMER in the last 72 hours. Hemoglobin A1C No results for input(s): HGBA1C in the last 72 hours. Fasting Lipid Panel  Recent Labs  10/24/16 1822  CHOL 323*  HDL 70  LDLCALC 206*  TRIG 236*  CHOLHDL 4.6   Thyroid Function Tests  Recent Labs  10/24/16 1822  TSH 2.330    Telemetry    NSR  ECG    10/25/16 NSR, TWI 2,3,F, V5-V6, septal Q V2   Cardiac Studies and Radiology    Dg Chest 2 View  Result Date: 10/24/2016 CLINICAL DATA:  Initial evaluation for acute chest pain, chest tightness. EXAM: CHEST  2 VIEW COMPARISON:  Prior radiograph from  03/28/2015. FINDINGS: Cardiomegaly is stable from prior. Mediastinal silhouette within normal limits. Lungs normally inflated. Central vascular congestion without overt pulmonary edema. No pleural effusion. No focal infiltrates identified. No pneumothorax. No acute osseous abnormality. IMPRESSION: 1. Stable cardiomegaly with central perihilar vascular congestion without overt pulmonary edema. 2. No other active cardiopulmonary disease. Electronically Signed   By: Jeannine Boga M.D.   On: 10/24/2016 16:59   US Renal  Result Date: 10/26/2016 CLINICAL DATA:  Acute renal injury. EXAM: RENAL / URINARY TRACT ULTRASOUND COMPLETE COMPARISON:  No recent prior. FINDINGS: Right Kidney: Length: 11.1 cm. Increased echogenicity consistent chronic medical renal disease. No mass or hydronephrosis visualized. Left Kidney: Length: 11.2 cm. Increased echogenicity consistent chronic medical renal disease. No mass or hydronephrosis visualized. Bladder: Appears normal for degree of bladder distention. IMPRESSION: 1. Increased echogenicity both kidneys consistent chronic medical renal disease. 2.  No acute abnormality.  No hydronephrosis bladder distention. Electronically Signed   By: Marcello Moores  Register   On: 10/26/2016 15:59   Nm Myocar Multi W/spect Tamela Oddi Motion / Ef  Result Date: 10/26/2016  There was no ST segment deviation noted during stress.  No T wave inversion was noted during stress.  Defect 1: There is a medium defect of mild severity present in the mid anterior and mid anteroseptal location.  The left ventricular ejection fraction is mildly decreased (45-54%).  Nuclear stress EF: 52%.  Intermediate risk, equivocal study. There is a reversible mid anterior wall defect that may represent shifting breast attenuation artifact, but ischemia in a branch of the LAD artery cannot be confidently excluded. Borderline reduced left ventricular systolic function. Abnormal ECG at baseline.    Assessment & Plan    1  Chest pain - Lexiscan done 11/6. No ST segment deviation or T wave inversion noted during stress. She did have a reversible mid anterior wall defect that may be a breast artifact. Equivocal study. Will need to go for LHC in the next few days as her renal function improves. Appears to have peaked today at 2.52. Hopefully will down trend back to her baseline over the next several days.   2 hypertension- blood pressure improved today. Continue medications as ordered, hydralazine PRN.   3 acute on chronic stage III kidney disease- we'll hold on HCTZ and ARB for now. Would add later has renal function stabilizes. FeNA is 0.3%. Likely pre-renal. Getting gentle IVF.   4 severe hyperlipidemia-TSH WNL.  Lipitor 80 mg daily added. Check  lipids and liver in 4 weeks.  5 Type 2 IDDM- per primary  Signed, Maryellen Pile PGY2 IM Resident [phone] 9:17 AM 10/27/2016   Attending Note:   The patient was seen and examined.  Agree with assessment and plan as noted above.  Changes made to the above note as needed.  Patient seen and independently examined with Maryellen Pile, PGY2 .   We discussed all aspects of the encounter. I agree with the assessment and plan as stated above.  I have reviewed the myoview images. Difficult to sort out.   Her ant. Defect could be just shifting  breast attenuation . Cannot rule out ischemia . For cath once renal function has improved.     I have spent a total of 40 minutes with patient reviewing hospital  notes , telemetry, EKGs, labs and examining patient as well as establishing an assessment and plan that was discussed with the patient. > 50% of time was spent in direct patient care.    Thayer Headings, Brooke Bonito., MD, [facility] 10/27/2016, 10:29 AM [address],  [place] Pager [phone]

## 2016-10-27 NOTE — Progress Notes (Signed)
Nutrition Education Note  RD consulted for Renal/Carb Modified Education.  Pt reports following a low sodium low carb diet PTA. She eats mostly vegetables and protein; she avoids all sugar, potatoes, corn, rice, pasta, bread etc. She eats a lot of salad, spinach, cabbage, bok choi, and stir fry vegetables. She reports using very little oil or butter when cooking and reports using sodium-free seasonings. She uses small amounts of chick pea flour for cooking and occasionally eats small portions of berries. She reports eating this way for over a year which as helped her to control her blood glucose between 130 and 160 and has helped her lose 30 lbs. Pt states that her highest weight ever was 295 lbs and states that when she started her diet, she weighed 240 lbs. We discussed that small amounts of carbohydrates are normal for a healthy diet and that excessive protein intake may contribute to the progression of kidney disease. Pt is committed to her low carb diet at this time. Encouraged pt to continue low sodium diet.   She feels that there is very little she can eat here in the hospital. Provided My [place] for Kidney Disease. We reviewed protein and vegetables that patient can order while admitted. We reviewed high phosphorus foods to limit/avoid. We discussed appropriate portion sizes for protein and reviewed servings per day appropriate for CKD 3. Pt states that her kidney's were fine PTA and kidney issues developed on admission due to dehydration. RD encouraged pt to discuss diet restrictions necessary with MD prior to discharge. Encouraged pt to refer to handouts provided if MD recommends renal diet.   Teach back method used. Expect good compliance.   Body mass index is 46.4 kg/m. Pt meets criteria for Morbid Obesity based on current BMI.  Current diet order is Renal 1.2 L, patient is consuming approximately 50-100% of meals at this time. Labs and medications reviewed. No further nutrition  interventions warranted at this time. If additional nutrition issues arise, please re-consult RD.  Scarlette Ar RD, CSP, LDN Inpatient Clinical Dietitian Pager: [phone] After Hours Pager: [phone]

## 2016-10-27 NOTE — Care Management Note (Signed)
Case Management Note  Patient Details  Name: Pamela Campbell MRN: [id number] Date of Birth: [birth year]  Subjective/Objective:     Admitted with Chest Pain               Action/Plan: Patient is independent prior to admission; works full time at a [employer]; No PCP, No medical insurance. Patient is agreeable to go to the [facility] for follow up medical care; she can get her prescriptions filled there also. She states that she eats a heart healthy diet and lost 40 lbs until she started working her new job ( hours at work 4:30 pm - 2:30 am); patient stated that she plans to be more active, walking at work and joining a gym. She has scales at home; CM will continue to follow for DCP.  Expected Discharge Date:      Possible 10/29/2016            Expected Discharge Plan:  Home/Self Care  In-House Referral:  Financial Counselor  Discharge planning Services  CM Consult   Status of Service:  In process, will continue to follow  Sherrilyn Rist [phone] 10/27/2016, 4:24 PM

## 2016-10-27 NOTE — Progress Notes (Signed)
Family Medicine Teaching Service Daily Progress Note Intern Pager: [phone]  Patient name: Pamela Campbell Medical record number: [id number] Date of birth: [birth year] Age: 55 y.o. Gender: female  Primary Care Provider: No PCP Per Patient Consultants: Cardiology Code Status: FULL (confirmed on admission)  Pt Overview and Major Events to Date:  11/05: Admitted for ACS r/o 11/06: Nuclear stress performed, intermediate risk, would benefit from cath but has AoCKD, gentle hydration  Assessment and Plan: Pamela Campbell is a 55 y.o. female presenting with chest tightness and dyspnea on exertion. PMH is significant for untreated HTN, HLD, DM2, CKD3, HFpEF and NSTEMI (03/2016).  #Chest Pain, Acute, Resolved:  Continues to remain stable and asymptomatic. Heart score 5-6. Trop 0.07>0.08>0.07. Repeat EKG with continued nonspecific T wave changes. TSH 2.330. Nuclear stress performed showing EF 52%, no ST changes, intermediate risk and would benefit from cath but has AoCKD. Providing gentle fluids. --Cardiology following, appreciate recommendations --ECHO pending --Coreg 12.5 mg BID --Amlodipine 5 mg QD --Atorvastatin 80 mg QD --Telemetry --Continuous pulse ox --A1c pending --Morphine 4 mg/mL IV q2h PRN severe pain --Zofran q6h PRN nausea --Aspirin 81 mg QD  #Hypertensive Emergency, Acute, Resolved: BP controlled at 120s/60s overnight. AoCKD and what looks to be demand ischemia.  Previously on ARB, BB, Thiazide, but not taken antihypertensives in 2 years.  --Coreg 12.5 mg BID --Amlodipine 5 mg QD --Consider ACE-I/ARB (once AKI improved since has CKD3/DM2) as appropriate outpatient --Trend repeat BMP in AM  #Acute on Chronic Kidney Disease, Stage 3, Uncontrolled:  Cr increasing to 2.52 as of 11/07. Baseline Cr 1.25-1.34. Injury likely related to uncontrolled HTN. FENa 0.3% pre-renal. --Avoid nephrotoxic agents --Conservative control of BP to avoid hypoperfusion to kidneys --Trend repeat  BMP in AM --Gentle hydration NS @50cc /h --Renal U/S w/o hydronephrosis or bladder distention --Urine micro shows many bacteria but UA only pertinent for >300 protein and 250 glucose, UCx pending, will initiate Keflex 500 mg BID (11/07>)  #Diabetes Mellitus Type 2, Chronic, Uncontrolled:  Glucose appropriate in 160-190s. Not taken medication in over 2 years. Previously on Lantus. --sSSI --CBG QAC and QHS --A1c pending --C/s CM for assistance with meds/ PCP  #Hyperlipidemia, Chronic, Uncontrolled:   Elevated total cholesterol 323, LDL 206, HDL70. ASCVD risk 27.0%. High intensity statin recommended. Risk factors include h/o possible NSTEMI, uncontrolled HTN, uncontrolled DM2, h/o smoking. +Paternal history of early MI (14). --Lipitor 80 mg QD  #HFpEF, Chronic, Stable:  Nuclear stress performed 11/06 showed preserved LVEF 45-54% and w/o ST changes. BNP 144.9, CXR no pulmonary edema. Receiving gentle fluids but contiue to not show signs of fluid overload. --ECHO pending  #Sleep Apnea, Chronic, Uncontrolled:  Per chart review. Patient denies she has this and notes that she does not use CPAP at home.  FEN/GI: Heart healthy/ Carb mod diet, gentle hydration NS @50cc /h Prophylaxis: Lovenox SQ  Disposition: Pending improvement AoCKD for cardiac cath per cards rec s/p nuclear stress  Subjective:  No overnight events. VSS and afebrile. Patient reports that she is feeling well today. Denies chest tightness, dyspnea, visual disturbance. She reports nausea improved since yesterday after nuclear study. Has an appetite wanting a non-renal diet and comfortable.  Objective: Temp:  [98 F (36.7 C)-98.9 F (37.2 C)] 98.6 F (37 C) (11/07 0624) Pulse Rate:  [71-79] 71 (11/07 0624) Resp:  [18-20] 18 (11/07 0624) BP: (120-208)/(64-120) 133/79 (11/07 0624) SpO2:  [96 %-100 %] 96 % (11/07 0624) Weight:  [270 lb 4.8 oz (122.6 kg)] 270 lb 4.8 oz (122.6 kg) (  11/07 8657) Physical Exam: General:  sitting up in bed, NAD Cardiovascular: RRR, no murmurs, +2pulses Respiratory: CTAB, normal WOB on room air Abdomen: obese, soft, NT/ND, +BS Extremities: WWP, no edema  Laboratory:  Recent Labs Lab 10/25/16 0618 10/26/16 0433 10/27/16 0449  WBC 12.2* 11.4* 10.4  HGB 11.6* 11.6* 10.7*  HCT 36.0 36.7 34.1*  PLT 288 285 259    Recent Labs Lab 10/25/16 0618 10/26/16 0433 10/27/16 0449  NA 137 137 139  K 4.4 4.2 4.1  CL 107 106 106  CO2 25 24 26   BUN 24* 32* 38*  CREATININE 2.11* 2.49* 2.52*  CALCIUM 8.3* 8.5* 8.3*  GLUCOSE 193* 173* 158*    Cardiac Panel (last 3 results)  Recent Labs  10/24/16 1822 10/24/16 2322 10/25/16 0618  TROPONINI 0.07* 0.08* 0.07*   Lipid Panel     Component Value Date/Time   CHOL 323 (H) 10/24/2016 1822   TRIG 236 (H) 10/24/2016 1822   HDL 70 10/24/2016 1822   CHOLHDL 4.6 10/24/2016 1822   VLDL 47 (H) 10/24/2016 1822   LDLCALC 206 (H) 10/24/2016 1822    Imaging/Diagnostic Tests: DG Chest 2 View (10/24/16) FINDINGS: Cardiomegaly is stable from prior. Mediastinal silhouette within normal limits. Lungs normally inflated. Central vascular congestion without overt pulmonary edema. No pleural effusion. No focal infiltrates identified. No pneumothorax. No acute osseous abnormality.  IMPRESSION: 1. Stable cardiomegaly with central perihilar vascular congestion without overt pulmonary edema. 2. No other active cardiopulmonary disease.  US Renal (10/26/16) FINDINGS: Right Kidney: Length: 11.1 cm. Increased echogenicity consistent chronic medical renal disease. No mass or hydronephrosis visualized. Left Kidney: Length: 11.2 cm. Increased echogenicity consistent chronic medical renal disease. No mass or hydronephrosis visualized. Bladder: Appears normal for degree of bladder distention.  IMPRESSION: 1. Increased echogenicity both kidneys consistent chronic medical renal disease. 2.  No acute abnormality.  No hydronephrosis  bladder distention.  NM Myocar Multi W/Spect W/Wall Motion / EF (10/26/16)  There was no ST segment deviation noted during stress.  No T wave inversion was noted during stress.  Defect 1: There is a medium defect of mild severity present in the mid anterior and mid anteroseptal location.  The left ventricular ejection fraction is mildly decreased (45-54%).  Nuclear stress EF: 52%.   Intermediate risk, equivocal study. There is a reversible mid anterior wall defect that may represent shifting breast attenuation artifact, but ischemia in a branch of the LAD artery cannot be confidently excluded. (read as shifting  breast artifact per Dr. Acie Fredrickson) Borderline reduced left ventricular systolic function. Abnormal ECG at baseline.    [place] Bing, DO 10/27/2016, 7:27 AM PGY-1, [place] Intern pager: [phone], text pages welcome

## 2016-10-28 DIAGNOSIS — R079 Chest pain, unspecified: Secondary | ICD-10-CM

## 2016-10-28 LAB — RENAL FUNCTION PANEL
ANION GAP: 8 (ref 5–15)
Albumin: 2.4 g/dL — ABNORMAL LOW (ref 3.5–5.0)
BUN: 37 mg/dL — ABNORMAL HIGH (ref 6–20)
CHLORIDE: 107 mmol/L (ref 101–111)
CO2: 22 mmol/L (ref 22–32)
Calcium: 8.6 mg/dL — ABNORMAL LOW (ref 8.9–10.3)
Creatinine, Ser: 2.3 mg/dL — ABNORMAL HIGH (ref 0.44–1.00)
GFR calc non Af Amer: 23 mL/min — ABNORMAL LOW (ref >60)
GFR, EST AFRICAN AMERICAN: 27 mL/min — AB (ref >60)
GLUCOSE: 129 mg/dL — AB (ref 65–99)
Phosphorus: 4.2 mg/dL (ref 2.5–4.6)
Potassium: 4 mmol/L (ref 3.5–5.1)
Sodium: 137 mmol/L (ref 135–145)

## 2016-10-28 LAB — GLUCOSE, CAPILLARY
GLUCOSE-CAPILLARY: 132 mg/dL — AB (ref 65–99)
GLUCOSE-CAPILLARY: 142 mg/dL — AB (ref 65–99)
Glucose-Capillary: 116 mg/dL — ABNORMAL HIGH (ref 65–99)
Glucose-Capillary: 120 mg/dL — ABNORMAL HIGH (ref 65–99)

## 2016-10-28 LAB — CBC
HCT: 34.4 % — ABNORMAL LOW (ref 36.0–46.0)
HEMOGLOBIN: 11 g/dL — AB (ref 12.0–15.0)
MCH: 27 pg (ref 26.0–34.0)
MCHC: 32 g/dL (ref 30.0–36.0)
MCV: 84.3 fL (ref 78.0–100.0)
Platelets: 273 10*3/uL (ref 150–400)
RBC: 4.08 MIL/uL (ref 3.87–5.11)
RDW: 13.8 % (ref 11.5–15.5)
WBC: 11 10*3/uL — ABNORMAL HIGH (ref 4.0–10.5)

## 2016-10-28 LAB — HEMOGLOBIN A1C
Hgb A1c MFr Bld: 10.2 % — ABNORMAL HIGH (ref 4.8–5.6)
MEAN PLASMA GLUCOSE: 246 mg/dL

## 2016-10-28 MED ORDER — MAGNESIUM HYDROXIDE 400 MG/5ML PO SUSP
30.0000 mL | Freq: Every day | ORAL | Status: DC | PRN
  Administered 2016-10-28: 30 mL via ORAL
  Filled 2016-10-28: qty 30

## 2016-10-28 MED ORDER — AMLODIPINE BESYLATE 10 MG PO TABS
10.0000 mg | ORAL_TABLET | Freq: Every day | ORAL | Status: DC
  Administered 2016-10-28 – 2016-11-02 (×6): 10 mg via ORAL
  Filled 2016-10-28 (×6): qty 1

## 2016-10-28 MED ORDER — MORPHINE SULFATE (PF) 4 MG/ML IV SOLN: 2.0000 mg | INTRAVENOUS | Status: DC | PRN

## 2016-10-28 NOTE — Progress Notes (Signed)
She had an acute on chronic kidney injury with Cr 1.84 of 0.07 and nonspecific T wave changes on EKG.  Tr  BNP was noted to be elevated to 144.  Chest xray showed no evidence of pulmonary edema.  Patient clinically had no evidence of fluid overload and had no O2 requirement.   Cardiology was consulted who recommended that a stress test be performed

## 2016-10-28 NOTE — Progress Notes (Signed)
Pt refused bed alarm on. Will continue to do hourly rounding. 

## 2016-10-28 NOTE — Progress Notes (Signed)
Family Medicine Teaching Service Daily Progress Note Intern Pager: [phone]  Patient name: Pamela Campbell Medical record number: [id number] Date of birth: 1961-06-04 Age: 55 y.o. Gender: female  Primary Care Provider: No PCP Per Patient Consultants: Cardiology Code Status: FULL (confirmed on admission)  Pt Overview and Major Events to Date:  11/05: Admitted for ACS r/o 11/06: Nuclear stress performed, intermediate risk, would benefit from cath but has AoCKD, gentle hydration  Assessment and Plan: Pamela Campbell is a 55 y.o. female presenting with chest tightness and dyspnea on exertion. PMH is significant for untreated HTN, HLD, DM2, CKD3, HFpEF and NSTEMI (03/2016).  #Chest Pain, Acute, Resolved:  Continues to remain stable and asymptomatic. Heart score 5-6. Trop 0.07>0.08>0.07. Repeat EKG with continued nonspecific T wave changes. TSH 2.330. Nuclear stress performed showing EF 52%, no ST changes, intermediate risk and would benefit from cath but has AoCKD. ECHO 11/06 showed EF 55-60%, mod LVH, no wall motion abn and G2DD. Providing gentle fluids. --Cardiology following, appreciate recommendations --Coreg 12.5 mg BID --Increased Amlodipine 10 mg QD --Atorvastatin 80 mg QD --Telemetry --Continuous pulse ox --A1c pending --Morphine 4 mg/mL IV q2h PRN severe pain --Zofran q6h PRN nausea --Aspirin 81 mg QD  #Hypertensive Emergency, Acute, Resolved: BP 150/90 overnight, 183/90 this AM. AoCKD and what looks to be demand ischemia.  Previously on ARB, BB, Thiazide, but not taken antihypertensives in 2 years. Creatinine improving. --Coreg 12.5 mg BID --Increased Amlodipine 10 mg QD --Consider ACE-I/ARB (once AKI improved since has CKD3/DM2) as appropriate outpatient --Trend repeat BMP in AM  #Acute on Chronic Kidney Disease, Stage 3, Uncontrolled:  Creatinine improving 2.52>2.30 as of 11/08. Baseline Cr 1.25-1.34. Injury likely related to uncontrolled HTN. FENa 0.3%  pre-renal. --Avoid nephrotoxic agents --Conservative control of BP to avoid hypoperfusion to kidneys --Trend repeat BMP in AM --Gentle hydration NS @50cc /h --Renal U/S w/o hydronephrosis or bladder distention --Urine micro shows many bacteria but UA only pertinent for >300 protein and 250 glucose, UCx pending, on Keflex 500 mg BID (day 2)  #Diabetes Mellitus Type 2, Chronic, Uncontrolled:  Glucose appropriate in 130s. Not taken medication in over 2 years. Previously on Lantus. --sSSI --CBG QAC and QHS --A1c pending --C/s CM for assistance with meds/ PCP  #Hyperlipidemia, Chronic, Uncontrolled:   Elevated total cholesterol 323, LDL 206, HDL70. ASCVD risk 27.0%. High intensity statin recommended. Risk factors include h/o possible NSTEMI, uncontrolled HTN, uncontrolled DM2, h/o smoking. +Paternal history of early MI (6). --Lipitor 80 mg QD  #HFpEF, Chronic, Stable:  Nuclear stress performed 11/06 showed preserved LVEF 45-54% and w/o ST changes. BNP 144.9, CXR no pulmonary edema. ECHO 11/06 showed EF 55-60%, mod LVH, no wall motion abn and G2DD. Receiving gentle fluids but contiue to not show signs of fluid overload. UO -1.4L over last 24 hrs. Wt is up 9 lbs from admission. Given low albumin 2.4, pt could be 3rd spacing.  #Sleep Apnea, Chronic, Uncontrolled:  Per chart review. Patient denies she has this and notes that she does not use CPAP at home.  FEN/GI: Heart healthy/ Carb mod diet, gentle hydration NS @50cc /h Prophylaxis: Lovenox SQ  Disposition: Pending improvement AoCKD for cardiac cath per cards rec s/p nuclear stress  Subjective:  No overnight events. VSS and afebrile. Patient reports that she is feeling well today. Denies chest tightness, dyspnea, visual disturbance, nausea. Feels comfortable and wanting to know when she could leave.  Objective: Temp:  [98 F (36.7 C)-98.8 F (37.1 C)] 98.7 F (37.1 C) (11/08 5573) Pulse  Rate:  [69-72] 69 (11/08 0637) Resp:  [18]  18 (11/08 0637) BP: (153-183)/(84-93) 183/90 (11/08 0637) SpO2:  [93 %-97 %] 93 % (11/08 0637) Weight:  [271 lb 11.2 oz (123.2 kg)] 271 lb 11.2 oz (123.2 kg) (11/08 6387) Physical Exam: General: sitting up in bed, well nourished, well developed, in no acute distress with non-toxic appearance HEENT: normocephalic, atraumatic, moist mucous membranes CV: regular rate and rhythm without murmurs, rubs, or gallops Lungs: clear to auscultation bilaterally with normal work of breathing Skin: warm, dry, no rashes or lesions, cap refill < 2 seconds Extremities: warm and well perfused, normal tone, no pitting edema  Laboratory:  Recent Labs Lab 10/26/16 0433 10/27/16 0449 10/28/16 0312  WBC 11.4* 10.4 11.0*  HGB 11.6* 10.7* 11.0*  HCT 36.7 34.1* 34.4*  PLT 285 259 273    Recent Labs Lab 10/26/16 0433 10/27/16 0449 10/28/16 0312  NA 137 139 137  K 4.2 4.1 4.0  CL 106 106 107  CO2 24 26 22   BUN 32* 38* 37*  CREATININE 2.49* 2.52* 2.30*  CALCIUM 8.5* 8.3* 8.6*  GLUCOSE 173* 158* 129*    Cardiac Panel (last 3 results) No results for input(s): CKTOTAL, CKMB, TROPONINI, RELINDX in the last 72 hours. Lipid Panel     Component Value Date/Time   CHOL 323 (H) 10/24/2016 1822   TRIG 236 (H) 10/24/2016 1822   HDL 70 10/24/2016 1822   CHOLHDL 4.6 10/24/2016 1822   VLDL 47 (H) 10/24/2016 1822   LDLCALC 206 (H) 10/24/2016 1822    Imaging/Diagnostic Tests: DG Chest 2 View (10/24/16) FINDINGS: Cardiomegaly is stable from prior. Mediastinal silhouette within normal limits. Lungs normally inflated. Central vascular congestion without overt pulmonary edema. No pleural effusion. No focal infiltrates identified. No pneumothorax. No acute osseous abnormality.  IMPRESSION: 1. Stable cardiomegaly with central perihilar vascular congestion without overt pulmonary edema. 2. No other active cardiopulmonary disease.  US Renal (10/26/16) FINDINGS: Right Kidney: Length: 11.1 cm. Increased  echogenicity consistent chronic medical renal disease. No mass or hydronephrosis visualized. Left Kidney: Length: 11.2 cm. Increased echogenicity consistent chronic medical renal disease. No mass or hydronephrosis visualized. Bladder: Appears normal for degree of bladder distention.  IMPRESSION: 1. Increased echogenicity both kidneys consistent chronic medical renal disease. 2.  No acute abnormality.  No hydronephrosis bladder distention.  ECHO (10/26/16) Study Conclusions - Left ventricle: The cavity size was normal. Wall thickness was   increased in a pattern of moderate LVH. Systolic function was   normal. The estimated ejection fraction was in the range of 55%   to 60%. Wall motion was normal; there were no regional wall   motion abnormalities. Features are consistent with a pseudonormal   left ventricular filling pattern, with concomitant abnormal   relaxation and increased filling pressure (grade 2 diastolic   dysfunction). - Aortic valve: Trileaflet; mildly thickened, mildly calcified   leaflets. - Mitral valve: There was trivial regurgitation. - Left atrium: The atrium was mildly dilated.  NM Myocar Multi W/Spect W/Wall Motion / EF (10/26/16)  There was no ST segment deviation noted during stress.  No T wave inversion was noted during stress.  Defect 1: There is a medium defect of mild severity present in the mid anterior and mid anteroseptal location.  The left ventricular ejection fraction is mildly decreased (45-54%).  Nuclear stress EF: 52%.   Intermediate risk, equivocal study. There is a reversible mid anterior wall defect that may represent shifting breast attenuation artifact, but ischemia in a branch of  the LAD artery cannot be confidently excluded. (read as shifting  breast artifact per Dr. Acie Fredrickson) Borderline reduced left ventricular systolic function. Abnormal ECG at baseline.    [place] Bing, DO 10/28/2016, 6:56 AM PGY-1, [place] Intern pager: [phone], text pages welcome

## 2016-10-28 NOTE — Progress Notes (Signed)
Hospital Problem List     Principal Problem:   Chest pain with high risk for cardiac etiology Active Problems:   Hypertensive heart disease   Chronic diastolic congestive heart failure (HCC)   Accelerated hypertension   Obesity-BMI 42   Sleep apnea   Dyslipidemia   Type 2 diabetes mellitus (HCC)   CKD (chronic kidney disease) stage 3, GFR 30-59 ml/min   Hypertensive emergency   Acute-on-chronic kidney injury ([place])   Acute kidney injury ([place])   Unstable angina pectoris [facility])     Patient Profile:   Primary Cardiologist:  Morbidly obese AA female with a history of chronic diastolic CHF, HTN, HLD, Type 2 DM, and Stage 3 CKD who presented to [name] ED on 10/24/2016 for evaluation of chest pain. She was hypertensive in the ED, reported non compliance with antihypertensive medications for the past 1+ years. Cyclic troponin values have been 0.07, 0.08, and 0.07.   Subjective   No events overnight. Denies any further chest pressure/pain, headache, vision changes. Does note some dyspnea with exertion this morning however this has since resolved.   Inpatient Medications    . amLODipine  10 mg Oral Daily  . aspirin EC  81 mg Oral Daily  . atorvastatin  80 mg Oral q1800  . carvedilol  12.5 mg Oral BID WC  . cephALEXin  500 mg Oral Q12H  . enoxaparin (LOVENOX) injection  40 mg Subcutaneous Q24H  . insulin aspart  0-9 Units Subcutaneous TID WC  . pneumococcal 23 valent vaccine  0.5 mL Intramuscular Tomorrow-1000    Vital Signs    Vitals:   10/27/16 1331 10/27/16 2058 10/28/16 0637 10/28/16 0810  BP: (!) 153/84 (!) 157/93 (!) 183/90 (!) 183/93  Pulse: 72 72 69 67  Resp: 18 18 18    Temp: 98 F (36.7 C) 98.8 F (37.1 C) 98.7 F (37.1 C)   TempSrc: Oral Oral Oral   SpO2: 96% 97% 93%   Weight:   271 lb 11.2 oz (123.2 kg)   Height:        Intake/Output Summary (Last 24 hours) at 10/28/16 1026 Last data filed at 10/28/16 2505  Gross per 24 hour  Intake           1415.83 ml  Output              600 ml  Net           815.83 ml   Filed Weights   10/26/16 3976 10/27/16 0624 10/28/16 0637  Weight: 265 lb 3.2 oz (120.3 kg) 270 lb 4.8 oz (122.6 kg) 271 lb 11.2 oz (123.2 kg)    Physical Exam    General: Well developed, well nourished, female appearing in no acute distress. Head: Normocephalic, atraumatic.      Neck: Supple without bruits, JVD. Lungs:  Resp regular and unlabored, CTA. Heart: RRR, S1, S2, no S3, S4, or murmur; no rub. Abdomen: Soft, non-tender, non-distended with normoactive bowel sounds. No hepatomegaly. No rebound/guarding. No obvious abdominal masses. Extremities: No clubbing, cyanosis, edema. Distal pedal pulses are 2+ bilaterally. Neuro: Alert and oriented X 3. Moves all extremities spontaneously. Psych: Normal affect.  Labs    CBC  Recent Labs  10/27/16 0449 10/28/16 0312  WBC 10.4 11.0*  HGB 10.7* 11.0*  HCT 34.1* 34.4*  MCV 86.3 84.3  PLT 259 734   Basic Metabolic Panel  Recent Labs  10/27/16 0449 10/28/16 0312  NA 139 137  K 4.1 4.0  CL  106 107  CO2 26 22  GLUCOSE 158* 129*  BUN 38* 37*  CREATININE 2.52* 2.30*  CALCIUM 8.3* 8.6*  PHOS 5.2* 4.2   Liver Function Tests  Recent Labs  10/27/16 0449 10/28/16 0312  ALBUMIN 2.3* 2.4*   No results for input(s): LIPASE, AMYLASE in the last 72 hours. Cardiac Enzymes No results for input(s): CKTOTAL, CKMB, CKMBINDEX, TROPONINI in the last 72 hours. BNP Invalid input(s): POCBNP D-Dimer No results for input(s): DDIMER in the last 72 hours. Hemoglobin A1C No results for input(s): HGBA1C in the last 72 hours. Fasting Lipid Panel No results for input(s): CHOL, HDL, LDLCALC, TRIG, CHOLHDL, LDLDIRECT in the last 72 hours. Thyroid Function Tests No results for input(s): TSH, T4TOTAL, T3FREE, THYROIDAB in the last 72 hours.  Invalid input(s): FREET3  Telemetry    NSR   Cardiac Studies and Radiology    Dg Chest 2 View  Result Date:  10/24/2016 CLINICAL DATA:  Initial evaluation for acute chest pain, chest tightness. EXAM: CHEST  2 VIEW COMPARISON:  Prior radiograph from 03/28/2015. FINDINGS: Cardiomegaly is stable from prior. Mediastinal silhouette within normal limits. Lungs normally inflated. Central vascular congestion without overt pulmonary edema. No pleural effusion. No focal infiltrates identified. No pneumothorax. No acute osseous abnormality. IMPRESSION: 1. Stable cardiomegaly with central perihilar vascular congestion without overt pulmonary edema. 2. No other active cardiopulmonary disease. Electronically Signed   By: Jeannine Boga M.D.   On: 10/24/2016 16:59   US Renal  Result Date: 10/26/2016 CLINICAL DATA:  Acute renal injury. EXAM: RENAL / URINARY TRACT ULTRASOUND COMPLETE COMPARISON:  No recent prior. FINDINGS: Right Kidney: Length: 11.1 cm. Increased echogenicity consistent chronic medical renal disease. No mass or hydronephrosis visualized. Left Kidney: Length: 11.2 cm. Increased echogenicity consistent chronic medical renal disease. No mass or hydronephrosis visualized. Bladder: Appears normal for degree of bladder distention. IMPRESSION: 1. Increased echogenicity both kidneys consistent chronic medical renal disease. 2.  No acute abnormality.  No hydronephrosis bladder distention. Electronically Signed   By: Marcello Moores  Register   On: 10/26/2016 15:59   Nm Myocar Multi W/spect Tamela Oddi Motion / Ef  Result Date: 10/26/2016  There was no ST segment deviation noted during stress.  No T wave inversion was noted during stress.  Defect 1: There is a medium defect of mild severity present in the mid anterior and mid anteroseptal location.  The left ventricular ejection fraction is mildly decreased (45-54%).  Nuclear stress EF: 52%.  Intermediate risk, equivocal study. There is a reversible mid anterior wall defect that may represent shifting breast attenuation artifact, but ischemia in a branch of the LAD artery  cannot be confidently excluded. Borderline reduced left ventricular systolic function. Abnormal ECG at baseline.    Echocardiogram:  Study Conclusions  - Left ventricle: The cavity size was normal. Wall thickness was   increased in a pattern of moderate LVH. Systolic function was   normal. The estimated ejection fraction was in the range of 55%   to 60%. Wall motion was normal; there were no regional wall   motion abnormalities. Features are consistent with a pseudonormal   left ventricular filling pattern, with concomitant abnormal   relaxation and increased filling pressure (grade 2 diastolic   dysfunction). - Aortic valve: Trileaflet; mildly thickened, mildly calcified   leaflets. - Mitral valve: There was trivial regurgitation. - Left atrium: The atrium was mildly dilated.  Assessment & Plan    1 Chest pain - Resolved. Will need to go for [facility]. Cr peaked yesterday  at 2.52 and is improved at 2.3 today. Still up from baseline 1.2-1.4. Receiving gentle IVF. ECHO done yesterday shows EF 55-60% with LVH and grade 2 diastolic dysfunction. Likely secondary to her history of uncontrolled HTN. No signs of volume overload today, though complains of puffy hands.  -Continue gentle IVF, Cr improving -Plan for cath in 1-2 days as AKI resolves  2 Hypertension- blood pressure elevated today. Had her amlodipine increase to 10 mg today. Continue coreg 12.5. Ideally, would have ACE/ARB but avoiding given AKI. If pressures remain uncontrolled, could consider adding low dose hydralazine in the short term.   3 acute on chronic stage III kidney disease- we'll hold on HCTZ and ARB for now. Would add later has renal function stabilizes. Cr improving with gentle IVF.   4 severe hyperlipidemia-TSH WNL. Lipitor 80 mg daily added. Check lipids and liver in 4 weeks.  5 Type 2 IDDM- per primary  Signed, Maryellen Pile , NP 10:26 AM 10/28/2016 Pager: [phone]  Attending Note:   The patient was  seen and examined.  Agree with assessment and plan as noted above.  Changes made to the above note as needed.  Patient seen and independently examined with Maryellen Pile, PGY2.   We discussed all aspects of the encounter. I agree with the assessment and plan as stated above.  1.  Abnormal myoview:    Concerning for ant. Ischemia.   Could be shifting breast attenuation. Creatinine is still not quite back to baseline.  2.3 today . Hope to get it back down to 1.8 - 2.0 range. Have scheduled cath for Friday  Continue gently IV hydration .  Continue to get daily labs.    Thayer Headings, Brooke Bonito., MD, [facility] 10/28/2016, 11:32 AM [address],  [place] Pager [phone]

## 2016-10-28 NOTE — Progress Notes (Signed)
Patient alert and oriented sitting on side of the bed with mild edema in lower extremities. Fluids running for Kidney impairment. Plan for  Cardiac cath Thursday.

## 2016-10-28 NOTE — Progress Notes (Signed)
MD Aware of Blood pressure 200/100s Blood Medications adjusted. IV fluids running at 73ml/hr for Kidneys

## 2016-10-29 LAB — RENAL FUNCTION PANEL
ALBUMIN: 2.3 g/dL — AB (ref 3.5–5.0)
Anion gap: 8 (ref 5–15)
BUN: 32 mg/dL — AB (ref 6–20)
CALCIUM: 8.8 mg/dL — AB (ref 8.9–10.3)
CO2: 24 mmol/L (ref 22–32)
CREATININE: 2.04 mg/dL — AB (ref 0.44–1.00)
Chloride: 105 mmol/L (ref 101–111)
GFR calc Af Amer: 31 mL/min — ABNORMAL LOW (ref >60)
GFR calc non Af Amer: 26 mL/min — ABNORMAL LOW (ref >60)
GLUCOSE: 148 mg/dL — AB (ref 65–99)
Phosphorus: 3.8 mg/dL (ref 2.5–4.6)
Potassium: 4 mmol/L (ref 3.5–5.1)
SODIUM: 137 mmol/L (ref 135–145)

## 2016-10-29 LAB — URINE CULTURE

## 2016-10-29 LAB — GLUCOSE, CAPILLARY
GLUCOSE-CAPILLARY: 131 mg/dL — AB (ref 65–99)
GLUCOSE-CAPILLARY: 120 mg/dL — AB (ref 65–99)
Glucose-Capillary: 124 mg/dL — ABNORMAL HIGH (ref 65–99)
Glucose-Capillary: 157 mg/dL — ABNORMAL HIGH (ref 65–99)

## 2016-10-29 LAB — CBC
HEMATOCRIT: 34.5 % — AB (ref 36.0–46.0)
HEMOGLOBIN: 11.1 g/dL — AB (ref 12.0–15.0)
MCH: 26.7 pg (ref 26.0–34.0)
MCHC: 32.2 g/dL (ref 30.0–36.0)
MCV: 82.9 fL (ref 78.0–100.0)
Platelets: 283 10*3/uL (ref 150–400)
RBC: 4.16 MIL/uL (ref 3.87–5.11)
RDW: 13.8 % (ref 11.5–15.5)
WBC: 10.9 10*3/uL — AB (ref 4.0–10.5)

## 2016-10-29 MED ORDER — SODIUM CHLORIDE 0.9 % IV SOLN
INTRAVENOUS | Status: DC
  Administered 2016-10-30: 06:00:00 via INTRAVENOUS

## 2016-10-29 MED ORDER — HYDRALAZINE HCL 25 MG PO TABS
25.0000 mg | ORAL_TABLET | Freq: Three times a day (TID) | ORAL | Status: DC
  Administered 2016-10-29 – 2016-10-30 (×2): 25 mg via ORAL
  Filled 2016-10-29 (×3): qty 1

## 2016-10-29 MED ORDER — CEPHALEXIN 500 MG PO CAPS
500.0000 mg | ORAL_CAPSULE | Freq: Two times a day (BID) | ORAL | Status: DC
  Administered 2016-10-29 – 2016-11-02 (×8): 500 mg via ORAL
  Filled 2016-10-29 (×8): qty 1

## 2016-10-29 MED ORDER — SODIUM CHLORIDE 0.9 % IV SOLN: 250.0000 mL | INTRAVENOUS | Status: DC | PRN

## 2016-10-29 MED ORDER — SODIUM CHLORIDE 0.9% FLUSH
3.0000 mL | Freq: Two times a day (BID) | INTRAVENOUS | Status: DC
  Administered 2016-10-29 – 2016-10-30 (×2): 3 mL via INTRAVENOUS

## 2016-10-29 MED ORDER — ASPIRIN 81 MG PO CHEW
81.0000 mg | CHEWABLE_TABLET | ORAL | Status: AC
  Administered 2016-10-30: 81 mg via ORAL
  Filled 2016-10-29: qty 1

## 2016-10-29 MED ORDER — SODIUM CHLORIDE 0.9% FLUSH: 3.0000 mL | INTRAVENOUS | Status: DC | PRN

## 2016-10-29 NOTE — Progress Notes (Signed)
Family Medicine Teaching Service Daily Progress Note Intern Pager: [phone]  Patient name: Pamela Campbell Medical record number: [id number] Date of birth: 1961-07-17 Age: 55 y.o. Gender: female  Primary Care Provider: No PCP Per Patient Consultants: Cardiology Code Status: FULL (confirmed on admission)  Pt Overview and Major Events to Date:  11/05: Admitted for ACS r/o 11/06: Nuclear stress performed, intermediate risk, would benefit from cath but has AoCKD, gentle hydration  Assessment and Plan: Pamela Campbell is a 55 y.o. female presenting with chest tightness and dyspnea on exertion. PMH is significant for untreated HTN, HLD, DM2, CKD3, HFpEF and NSTEMI (03/2016).  #Chest Pain, Acute, Resolved:  Continues to remain stable and asymptomatic. Heart score 5-6. Trop 0.07>0.08>0.07. Repeat EKG with continued nonspecific T wave changes. TSH 2.330. Nuclear stress performed showing EF 52%, no ST changes, intermediate risk and would benefit from cath but has AoCKD. ECHO 11/06 showed EF 55-60%, mod LVH, no wall motion abn and G2DD. Providing gentle fluids. Cardiology to L-heart cath 11/10 if AKI continues to improve. --Cardiology following, appreciate recommendations --Coreg 12.5 mg BID --Amlodipine 10 mg QD --Atorvastatin 80 mg QD --Telemetry --Continuous pulse ox --A1c pending --Morphine 4 mg/mL IV q2h PRN severe pain --Zofran q6h PRN nausea --Aspirin 81 mg QD  #Hypertensive Emergency, Acute, Resolved: BP 212/109 at 1800 yesterday, overnight 150-190/70-80, 152/77 this AM. AoCKD and what looks to be demand ischemia.  Previously on ARB, BB, Thiazide, but not taken antihypertensives in 2 years. Creatinine continues to improve. --Coreg 12.5 mg BID --Amlodipine 10 mg QD --Will start Hydralazine 25 mg TID --Consider ACE-I/ARB (once AKI improved since has CKD3/DM2) as appropriate outpatient --Trend repeat BMP in AM  #Acute on Chronic Kidney Disease, Stage 3, Uncontrolled:  Creatinine  improving 2.30>2.04 as of 11/09. Baseline Cr 1.25-1.34. Injury likely related to uncontrolled HTN. FENa 0.3% pre-renal. Renal U/S w/o hydronephrosis or bladder distention. --Avoid nephrotoxic agents --Conservative control of BP to avoid hypoperfusion to kidneys --Trend repeat BMP in AM --Continue gentle hydration NS @50cc /h --Urine micro shows many bacteria but UA only pertinent for >300 protein and 250 glucose, UCx pending, on Keflex 500 mg BID (day 3)  #Diabetes Mellitus Type 2, Chronic, Uncontrolled:  Glucose appropriate in 140s. A1c 10.2. Not taken medication in over 2 years. Previously on Lantus. --sSSI --CBG QAC and QHS --Consult CM for assistance with meds/ PCP --Consider low dose sulfonylurea on d/c  #Hyperlipidemia, Chronic, Uncontrolled:   Elevated total cholesterol 323, LDL 206, HDL70. ASCVD risk 27.0%. High intensity statin recommended. Risk factors include h/o possible NSTEMI, uncontrolled HTN, uncontrolled DM2, h/o smoking. +Paternal history of early MI (26). --Lipitor 80 mg QD  #HFpEF, Chronic, Stable:  Nuclear stress performed 11/06 showed preserved LVEF 45-54% and w/o ST changes. BNP 144.9, CXR no pulmonary edema. ECHO 11/06 showed EF 55-60%, mod LVH, no wall motion abn and G2DD. Receiving gentle fluids but contiue to not show signs of fluid overload. UO -2.2L over last 24 hrs. Wt decreased 1 lb since yesterday, wt is up 8 lbs from admission. Given low albumin 2.3, pt could be 3rd spacing.  #Sleep Apnea, Chronic, Uncontrolled:  Per chart review. Patient denies she has this and notes that she does not use CPAP at home.  FEN/GI: Heart healthy/ Carb mod diet, gentle hydration NS @50cc /h Prophylaxis: Lovenox SQ  Disposition: Pending improvement AoCKD for cardiac cath scheduled 11/10   Subjective:  No overnight events. Patient reports that she is feeling well today. Denies chest tightness, dyspnea, visual disturbance, nausea. Patient  is pleased to know about the  possibility of having cath done tomorrow.  Objective: Temp:  [97.9 F (36.6 C)-98.6 F (37 C)] 97.9 F (36.6 C) (11/09 0547) Pulse Rate:  [67-79] 73 (11/09 0547) Resp:  [18-20] 18 (11/09 0547) BP: (152-212)/(77-109) 152/77 (11/09 0547) SpO2:  [97 %-99 %] 97 % (11/09 0547) Weight:  [270 lb 6.4 oz (122.7 kg)] 270 lb 6.4 oz (122.7 kg) (11/09 0547) Physical Exam: General: sitting up in bed, well nourished, well developed, in no acute distress with non-toxic appearance HEENT: normocephalic, atraumatic, moist mucous membranes CV: regular rate and rhythm without murmurs, rubs, or gallops Lungs: clear to auscultation bilaterally with normal work of breathing Skin: warm, dry, no rashes or lesions, cap refill < 2 seconds Extremities: warm and well perfused, normal tone, no pitting edema  Laboratory:  Recent Labs Lab 10/27/16 0449 10/28/16 0312 10/29/16 0300  WBC 10.4 11.0* 10.9*  HGB 10.7* 11.0* 11.1*  HCT 34.1* 34.4* 34.5*  PLT 259 273 283    Recent Labs Lab 10/27/16 0449 10/28/16 0312 10/29/16 0300  NA 139 137 137  K 4.1 4.0 4.0  CL 106 107 105  CO2 26 22 24   BUN 38* 37* 32*  CREATININE 2.52* 2.30* 2.04*  CALCIUM 8.3* 8.6* 8.8*  GLUCOSE 158* 129* 148*    Cardiac Panel (last 3 results) No results for input(s): CKTOTAL, CKMB, TROPONINI, RELINDX in the last 72 hours. Lipid Panel     Component Value Date/Time   CHOL 323 (H) 10/24/2016 1822   TRIG 236 (H) 10/24/2016 1822   HDL 70 10/24/2016 1822   CHOLHDL 4.6 10/24/2016 1822   VLDL 47 (H) 10/24/2016 1822   LDLCALC 206 (H) 10/24/2016 1822    Imaging/Diagnostic Tests: DG Chest 2 View (10/24/16) FINDINGS: Cardiomegaly is stable from prior. Mediastinal silhouette within normal limits. Lungs normally inflated. Central vascular congestion without overt pulmonary edema. No pleural effusion. No focal infiltrates identified. No pneumothorax. No acute osseous abnormality.  IMPRESSION: 1. Stable cardiomegaly with  central perihilar vascular congestion without overt pulmonary edema. 2. No other active cardiopulmonary disease.  US Renal (10/26/16) FINDINGS: Right Kidney: Length: 11.1 cm. Increased echogenicity consistent chronic medical renal disease. No mass or hydronephrosis visualized. Left Kidney: Length: 11.2 cm. Increased echogenicity consistent chronic medical renal disease. No mass or hydronephrosis visualized. Bladder: Appears normal for degree of bladder distention.  IMPRESSION: 1. Increased echogenicity both kidneys consistent chronic medical renal disease. 2.  No acute abnormality.  No hydronephrosis bladder distention.  ECHO (10/26/16) Study Conclusions - Left ventricle: The cavity size was normal. Wall thickness was   increased in a pattern of moderate LVH. Systolic function was   normal. The estimated ejection fraction was in the range of 55%   to 60%. Wall motion was normal; there were no regional wall   motion abnormalities. Features are consistent with a pseudonormal   left ventricular filling pattern, with concomitant abnormal   relaxation and increased filling pressure (grade 2 diastolic   dysfunction). - Aortic valve: Trileaflet; mildly thickened, mildly calcified   leaflets. - Mitral valve: There was trivial regurgitation. - Left atrium: The atrium was mildly dilated.  NM Myocar Multi W/Spect W/Wall Motion / EF (10/26/16)  There was no ST segment deviation noted during stress.  No T wave inversion was noted during stress.  Defect 1: There is a medium defect of mild severity present in the mid anterior and mid anteroseptal location.  The left ventricular ejection fraction is mildly decreased (45-54%).  Nuclear  stress EF: 52%.   Intermediate risk, equivocal study. There is a reversible mid anterior wall defect that may represent shifting breast attenuation artifact, but ischemia in a branch of the LAD artery cannot be confidently excluded. (read as shifting   breast artifact per Dr. Acie Fredrickson) Borderline reduced left ventricular systolic function. Abnormal ECG at baseline.  [place] Bing, DO 10/29/2016, 7:16 AM PGY-1, [place] Intern pager: [phone], text pages welcome

## 2016-10-29 NOTE — Progress Notes (Signed)
Patient Name: Pamela Campbell Date of Encounter: 10/29/2016  Primary Cardiologist: Dr. Sande Rives Problem List     Principal Problem:   Chest pain with high risk for cardiac etiology Active Problems:   Hypertensive heart disease   Chronic diastolic congestive heart failure (HCC)   Accelerated hypertension   Obesity-BMI 42   Sleep apnea   Dyslipidemia   Type 2 diabetes mellitus (HCC)   CKD (chronic kidney disease) stage 3, GFR 30-59 ml/min   Hypertensive emergency   Acute-on-chronic kidney injury ([place])   Acute kidney injury ([place])   Unstable angina pectoris ([place])     Subjective   Feels ok, denies chest pain and SOB.   Inpatient Medications    Scheduled Meds: . amLODipine  10 mg Oral Daily  . aspirin EC  81 mg Oral Daily  . atorvastatin  80 mg Oral q1800  . carvedilol  12.5 mg Oral BID WC  . cephALEXin  500 mg Oral Q12H  . enoxaparin (LOVENOX) injection  40 mg Subcutaneous Q24H  . insulin aspart  0-9 Units Subcutaneous TID WC   Continuous Infusions: . sodium chloride 50 mL/hr at 10/28/16 1749   PRN Meds: acetaminophen, gi cocktail, hydrALAZINE, magnesium hydroxide, ondansetron (ZOFRAN) IV, technetium tetrofosmin   Vital Signs    Vitals:   10/28/16 1804 10/28/16 1821 10/28/16 2229 10/29/16 0547  BP: (!) 212/109 (!) 197/77 (!) 158/83 (!) 152/77  Pulse:  79 79 73  Resp:   18 18  Temp:   98.6 F (37 C) 97.9 F (36.6 C)  TempSrc:   Oral Oral  SpO2:   99% 97%  Weight:    270 lb 6.4 oz (122.7 kg)  Height:        Intake/Output Summary (Last 24 hours) at 10/29/16 0659 Last data filed at 10/29/16 0653  Gross per 24 hour  Intake          1724.17 ml  Output             2200 ml  Net          -475.83 ml   Filed Weights   10/27/16 0624 10/28/16 0637 10/29/16 0547  Weight: 270 lb 4.8 oz (122.6 kg) 271 lb 11.2 oz (123.2 kg) 270 lb 6.4 oz (122.7 kg)    Physical Exam    GEN: Well nourished, well developed, in no acute distress.  HEENT: Grossly normal.    Neck: Supple, no JVD, carotid bruits, or masses. Cardiac: RRR, no murmurs, rubs, or gallops. No clubbing, cyanosis, edema.  Radials/DP/PT 2+ and equal bilaterally.  Respiratory:  Respirations regular and unlabored, clear to auscultation bilaterally. GI: Soft, nontender, nondistended, BS + x 4. MS: no deformity or atrophy. Skin: warm and dry, no rash. Neuro:  Strength and sensation are intact. Psych: AAOx3.  Normal affect.  Labs    CBC  Recent Labs  10/28/16 0312 10/29/16 0300  WBC 11.0* 10.9*  HGB 11.0* 11.1*  HCT 34.4* 34.5*  MCV 84.3 82.9  PLT 273 017   Basic Metabolic Panel  Recent Labs  10/28/16 0312 10/29/16 0300  NA 137 137  K 4.0 4.0  CL 107 105  CO2 22 24  GLUCOSE 129* 148*  BUN 37* 32*  CREATININE 2.30* 2.04*  CALCIUM 8.6* 8.8*  PHOS 4.2 3.8   Liver Function Tests  Recent Labs  10/28/16 0312 10/29/16 0300  ALBUMIN 2.4* 2.3*     Telemetry    NSR - Personally Reviewed  ECG  NSR with inferior T wave inversion - Personally Reviewed  Radiology    No results found.  Cardiac Studies   Lexiscan Myoview   There was no ST segment deviation noted during stress.  No T wave inversion was noted during stress.  Defect 1: There is a medium defect of mild severity present in the mid anterior and mid anteroseptal location.  The left ventricular ejection fraction is mildly decreased (45-54%).  Nuclear stress EF: 52%.   Intermediate risk, equivocal study. There is a reversible mid anterior wall defect that may represent shifting breast attenuation artifact, but ischemia in a branch of the LAD artery cannot be confidently excluded. Borderline reduced left ventricular systolic function. Abnormal ECG at baseline  Patient Profile     Morbidly obese AA female with a history of chronic diastolic CHF, HTN, HLD, Type 2 DM, and Stage 3 CKD who presented to [name] ED on 10/24/2016 for evaluation of chest pain. She was hypertensive in the ED, reported  non compliance with antihypertensive medications for the past 1+ years. Cyclic troponin values have been 0.07, 0.08, and 0.07.   Assessment & Plan    1. Chest pain - Resolved. Will need to go for [facility]. Cr peaked yesterday at 2.52 and is improved at 2.04 today. Still up from baseline 1.2-1.4. Receiving gentle IVF. ECHO done yesterday shows EF 55-60% with LVH and grade 2 diastolic dysfunction. Likely secondary to her history of uncontrolled HTN. No signs of volume overload today, though complains of puffy hands.  -Continue gentle IVF, Cr improving -Plan for cath tomorrow as AKI resolves  2 Hypertension- blood pressure elevated today. Had her amlodipine increase to 10 mg yesterday. Continue coreg 12.5. Ideally, would have ACE/ARB but avoiding given AKI.   Would add 25mg  Hydralazine TID.   3 Acute on chronic stage III kidney disease- we'll hold on HCTZ and ARB for now. Would add later has renal function stabilizes. Cr improving with gentle IVF.   4 severe hyperlipidemia-TSH WNL. Lipitor 80 mg daily added. Check lipids and liver in 4 weeks.  5 Type 2 IDDM-per primary   Signed, Arbutus Leas, NP  10/29/2016, 6:59 AM   Attending Note:   The patient was seen and examined.  Agree with assessment and plan as noted above.  Changes made to the above note as needed.  Patient seen and independently examined with Jettie Booze , NP .   We discussed all aspects of the encounter. I agree with the assessment and plan as stated above.  1. Abnormal myoview Should be able to have her cath tomorrow .  Creatinine continues to improve  Will anticipate cath tomorrow .    I have spent a total of 40 minutes with patient reviewing hospital  notes , telemetry, EKGs, labs and examining patient as well as establishing an assessment and plan that was discussed with the patient. > 50% of time was spent in direct patient care.    Thayer Headings, Brooke Bonito., MD, [facility] 10/29/2016, 10:03 AM [address]   [place] Pager [phone]

## 2016-10-30 DIAGNOSIS — I10 Essential (primary) hypertension: Secondary | ICD-10-CM

## 2016-10-30 DIAGNOSIS — I209 Angina pectoris, unspecified: Secondary | ICD-10-CM

## 2016-10-30 LAB — BASIC METABOLIC PANEL
Anion gap: 8 (ref 5–15)
Anion gap: 9 (ref 5–15)
BUN: 36 mg/dL — AB (ref 6–20)
BUN: 30 mg/dL — ABNORMAL HIGH (ref 6–20)
CHLORIDE: 109 mmol/L (ref 101–111)
CHLORIDE: 108 mmol/L (ref 101–111)
CO2: 21 mmol/L — ABNORMAL LOW (ref 22–32)
CO2: 20 mmol/L — AB (ref 22–32)
CREATININE: 2.07 mg/dL — AB (ref 0.44–1.00)
CREATININE: 1.99 mg/dL — AB (ref 0.44–1.00)
Calcium: 8.3 mg/dL — ABNORMAL LOW (ref 8.9–10.3)
Calcium: 8.5 mg/dL — ABNORMAL LOW (ref 8.9–10.3)
GFR calc Af Amer: 30 mL/min — ABNORMAL LOW (ref >60)
GFR calc non Af Amer: 26 mL/min — ABNORMAL LOW (ref >60)
GFR calc non Af Amer: 27 mL/min — ABNORMAL LOW (ref >60)
GFR, EST AFRICAN AMERICAN: 32 mL/min — AB (ref >60)
GLUCOSE: 131 mg/dL — AB (ref 65–99)
Glucose, Bld: 122 mg/dL — ABNORMAL HIGH (ref 65–99)
POTASSIUM: 3.8 mmol/L (ref 3.5–5.1)
Potassium: 3.7 mmol/L (ref 3.5–5.1)
SODIUM: 138 mmol/L (ref 135–145)
Sodium: 137 mmol/L (ref 135–145)

## 2016-10-30 LAB — GLUCOSE, CAPILLARY
GLUCOSE-CAPILLARY: 130 mg/dL — AB (ref 65–99)
GLUCOSE-CAPILLARY: 131 mg/dL — AB (ref 65–99)
Glucose-Capillary: 135 mg/dL — ABNORMAL HIGH (ref 65–99)
Glucose-Capillary: 127 mg/dL — ABNORMAL HIGH (ref 65–99)

## 2016-10-30 LAB — PROTIME-INR
INR: 0.99
PROTHROMBIN TIME: 13 s (ref 11.4–15.2)

## 2016-10-30 MED ORDER — HYDRALAZINE HCL 50 MG PO TABS
50.0000 mg | ORAL_TABLET | Freq: Three times a day (TID) | ORAL | Status: DC
  Administered 2016-10-30 – 2016-11-02 (×9): 50 mg via ORAL
  Filled 2016-10-30 (×9): qty 1

## 2016-10-30 MED ORDER — FUROSEMIDE 10 MG/ML IJ SOLN
40.0000 mg | Freq: Once | INTRAMUSCULAR | Status: AC
  Administered 2016-10-30: 40 mg via INTRAVENOUS
  Filled 2016-10-30: qty 4

## 2016-10-30 MED ORDER — NITROGLYCERIN IN D5W 200-5 MCG/ML-% IV SOLN
10.0000 ug/min | INTRAVENOUS | Status: DC
  Filled 2016-10-30: qty 250

## 2016-10-30 MED ORDER — FUROSEMIDE 10 MG/ML IJ SOLN
10.0000 mg | Freq: Once | INTRAMUSCULAR | Status: AC
  Administered 2016-10-30: 10 mg via INTRAVENOUS
  Filled 2016-10-30: qty 2

## 2016-10-30 NOTE — Progress Notes (Signed)
Patient unable to lay flat for cath, will plan for heart cath on Monday. Will need to gently hydrate (avoiding large volumes at one time) prior to cath.

## 2016-10-30 NOTE — Progress Notes (Signed)
Transitions of Care Pharmacy Note  Plan:  Educated on blood pressure medications, the link between high blood pressure and kidney damage  Addressed concerns regarding: 1. Medication costs and lack of insurance. Care management consult already entered. Patient will need additional services after discharge, including a PCP. The following are options for obtaining the medications at reduced cost: aspirin (OTC, $3 for 100 ct), atorvastatin 86m ($10 [employer], 30 days; [facility] has lovastatin 241m, carvedilol 12.19m74m$4 [facility], 30 days), hydralazine 219m219m4 [facility], 30 days). [facility] be able to further assist this patient, however they were closed at the time I saw the patient. Please follow-up with them on Monday about medication assistance.   2. Patient refuses to start insulin at home due to previous frustrations, but will need some diabetic medication at discharge. Recommend glipizide 19mg 74mly, with goal of 10mg 80m(max 20 mg/day) since metformin is inappropriate to start at eGFR < 45. [facility] wants to control her diabetes with diet and exercise, however we discussed that her A1c is too high currently for no medication. She is willing to take a pill for her sugars and work on diet/exercise together until her A1c is much improved. She stated her goal is no diabetes medications.   Recommend a stop date on cephalexin for UTI of 5-7 days. Today is day 4 of therapy. Please consider discontinuing tomorrow if clinically appropriate.   --------------------------------------------- DeliskRodney Booze 55 y.o54female who presents with a chief complaint of SOB and CP. Of note, she was not taking any medications for awhile prior to admission due to lack of insurance and cost of medications. In anticipation of discharge, pharmacy has reviewed this patient's prior to admission medication history, as well as current inpatient medications  listed per the MAR.  [facility] medication indications, dosing, frequency, and notable side effects reviewed with patient. patient verbalized understanding of current inpatient medication regimen and is aware that the After Visit Summary when presented, will represent the most accurate medication list at discharge.   Nancy L Gillooly expressed concerns regarding medication costs/lack of insurance and desire to control her diabetes via diet and exercise.    Assessment: Understanding of regimen: fair Understanding of indications: fair Potential of compliance: fair Barriers to Obtaining Medications: Yes  Patient instructed to contact inpatient pharmacy team with further questions or concerns if needed.    Time spent preparing for discharge counseling: 20 minutes Time spent counseling patient: 20 minutes   Thank you for allowing pharmacy to be a part of this patient's care.  Jerik Falletta Belia HemanmD PGY1 Pharmacy Resident [phone]) 10/30/2016 5:23 PM

## 2016-10-30 NOTE — Progress Notes (Signed)
Notified Christain Sacramento PA, that nitroglycerin iv cannot be given on the floor for for BP.  Instructed to hold nitro  and just give lasix 10mg  iv and hydralazin 50mg  po  that is scheduled for 1400 now.  Meds given.  Will continue to monitor.  Amanii Snethen,RN

## 2016-10-30 NOTE — Progress Notes (Signed)
At 1254 pt states " I feel full from the IV fluid and is coming up to my throat."  BP 183/97, P72.  IVF turned off and notified Christain Sacramento, PA .  Ordered to give pt lasix and 1400 hydralazin.  {see pt's chart). Nitro iv not able to give on floor for BP .  Also made PA aware. Karie Kirks, Therapist, sports.

## 2016-10-30 NOTE — Progress Notes (Signed)
Called and notified lab to draw labs as ordered.  Instructed by [place] that techincian was on lunch break and will come as soon as she is back.  Informed her is urgent.  Karie Kirks, Therapist, sports.

## 2016-10-30 NOTE — Progress Notes (Signed)
Pt NS @ 200 ml/hr ordered this am and had previous NS at 49ml/r which is currently stopped to proceed with new order.  Notified Erin, PA and instructed she will d/c NS at 67ml/hr.  Ajooni Karam, Therapist, sports.

## 2016-10-30 NOTE — Progress Notes (Addendum)
Patient Name: Pamela Campbell Date of Encounter: 10/30/2016  Primary Cardiologist: Dr. Sande Rives Problem List     Principal Problem:   Chest pain with high risk for cardiac etiology Active Problems:   Hypertensive heart disease   Chronic diastolic congestive heart failure (HCC)   Accelerated hypertension   Obesity-BMI 42   Sleep apnea   Dyslipidemia   Type 2 diabetes mellitus (HCC)   CKD (chronic kidney disease) stage 3, GFR 30-59 ml/min   Hypertensive emergency   Acute-on-chronic kidney injury ([place])   Acute kidney injury ([place])   Unstable angina pectoris ([place])     Subjective   Feels ok, denies chest pain and SOB.  For cath today    Inpatient Medications    Scheduled Meds: . amLODipine  10 mg Oral Daily  . aspirin EC  81 mg Oral Daily  . atorvastatin  80 mg Oral q1800  . carvedilol  12.5 mg Oral BID WC  . cephALEXin  500 mg Oral Q12H  . enoxaparin (LOVENOX) injection  40 mg Subcutaneous Q24H  . hydrALAZINE  25 mg Oral Q8H  . insulin aspart  0-9 Units Subcutaneous TID WC  . sodium chloride flush  3 mL Intravenous Q12H   Continuous Infusions: . sodium chloride 1,000 mL (10/29/16 1717)  . sodium chloride 10,000 mL (10/30/16 0910)   PRN Meds: sodium chloride, acetaminophen, gi cocktail, hydrALAZINE, magnesium hydroxide, ondansetron (ZOFRAN) IV, sodium chloride flush, technetium tetrofosmin   Vital Signs    Vitals:   10/29/16 1712 10/29/16 2031 10/30/16 0407 10/30/16 0752  BP: (!) 152/69 (!) 173/84 (!) 163/81 (!) 156/88  Pulse: 67 72 73 63  Resp:  18 18 20   Temp:  98.1 F (36.7 C) 98.6 F (37 C) 97.5 F (36.4 C)  TempSrc:  Oral Oral Oral  SpO2:  98% 99% 97%  Weight:   272 lb 7.8 oz (123.6 kg)   Height:        Intake/Output Summary (Last 24 hours) at 10/30/16 0942 Last data filed at 10/30/16 0830  Gross per 24 hour  Intake          2409.25 ml  Output             1400 ml  Net          1009.25 ml   Filed Weights   10/28/16 0637 10/29/16  0547 10/30/16 0407  Weight: 271 lb 11.2 oz (123.2 kg) 270 lb 6.4 oz (122.7 kg) 272 lb 7.8 oz (123.6 kg)    Physical Exam    GEN: Well nourished, well developed, in no acute distress.  HEENT: Grossly normal.  Neck: Supple, no JVD, carotid bruits, or masses. Cardiac: RRR, no murmurs, rubs, or gallops. No clubbing, cyanosis, edema.  Radials/DP/PT 2+ and equal bilaterally.  Respiratory:  Respirations regular and unlabored, clear to auscultation bilaterally. GI: Soft, nontender, nondistended, BS + x 4. MS: no deformity or atrophy. Skin: warm and dry, no rash. Neuro:  Strength and sensation are intact. Psych: AAOx3.  Normal affect.  Labs    CBC  Recent Labs  10/28/16 0312 10/29/16 0300  WBC 11.0* 10.9*  HGB 11.0* 11.1*  HCT 34.4* 34.5*  MCV 84.3 82.9  PLT 273 601   Basic Metabolic Panel  Recent Labs  10/28/16 0312 10/29/16 0300 10/30/16 0442  NA 137 137 138  K 4.0 4.0 3.7  CL 107 105 109  CO2 22 24 21*  GLUCOSE 129* 148* 131*  BUN 37* 32* 36*  CREATININE 2.30* 2.04* 2.07*  CALCIUM 8.6* 8.8* 8.3*  PHOS 4.2 3.8  --    Liver Function Tests  Recent Labs  10/28/16 0312 10/29/16 0300  ALBUMIN 2.4* 2.3*     Telemetry    NSR - Personally Reviewed  ECG    NSR with inferior T wave inversion - Personally Reviewed  Radiology    No results found.  Cardiac Studies   Lexiscan Myoview   There was no ST segment deviation noted during stress.  No T wave inversion was noted during stress.  Defect 1: There is a medium defect of mild severity present in the mid anterior and mid anteroseptal location.  The left ventricular ejection fraction is mildly decreased (45-54%).  Nuclear stress EF: 52%.   Intermediate risk, equivocal study. There is a reversible mid anterior wall defect that may represent shifting breast attenuation artifact, but ischemia in a branch of the LAD artery cannot be confidently excluded. Borderline reduced left ventricular systolic  function. Abnormal ECG at baseline  Patient Profile     Morbidly obese AA female with a history of chronic diastolic CHF, HTN, HLD, Type 2 DM, and Stage 3 CKD who presented to [name] ED on 10/24/2016 for evaluation of chest pain. She was hypertensive in the ED, reported non compliance with antihypertensive medications for the past 1+ years. Cyclic troponin values have been 0.07, 0.08, and 0.07.   Assessment & Plan    1. Chest pain - Resolved.  myoview showed an anterior defect that may be shifting breast artifact but may also be ischemia .  Will need to go for [facility]. Cr peaked yesterday at 2.52 and is improved at 2.04 today.  Her baseline is likely between 1.8 and 2.0 .   Dr. Claiborne Billings did not want to cath early this am but has increased the NS to 200 ml / hr and will recheck bmp at 1 pm    2 Hypertension- blood pressure elevated today. Had her amlodipine 10 mg yesterday. Continue coreg 12.5.  Increase Hydralazine to 50 gm TID.   3 Acute on chronic stage III kidney disease- we'll hold on HCTZ and ARB for now. Would add later has renal function stabilizes. Cr improving with gentle IVF.   4 severe hyperlipidemia-TSH WNL. Lipitor 80 mg daily added. Check lipids and liver in 4 weeks.  5 Type 2 IDDM-per primary   Signed, Mertie Moores, MD  10/30/2016, 9:42 AM    Addendum:  Pt's creatinine was 2.07 this am.  She had been receiving NS at 50 cc / HR for the past 3 days .  Dr. Claiborne Billings was still concerned about her creatinine and increased her IVF to 200 cc / hr and ordered a repeat BMP at 1 PM At 1:00 we received a nurse call that she was acutely short of breath and sitting bolt upright.    Lasix 40 mg IV x 1  has been ordered.   IV is not saline lock  Lab was not able to obtain blood sample yet. Will see if we can get off some volume to allow her to lie flat and have her cath later today .    Mertie Moores, MD  10/30/2016 2:02 PM    [facility] [address],  [place], [place]  [address] Pager [phone] Phone: [phone]; Fax: [phone]

## 2016-10-30 NOTE — Progress Notes (Signed)
Family Medicine Teaching Service Daily Progress Note Intern Pager: [phone]  Patient name: Pamela Campbell Medical record number: [id number] Date of birth: Sep 12, 1961 Age: 55 y.o. Gender: female  Primary Care Provider: No PCP Per Patient Consultants: Cardiology Code Status: FULL (confirmed on admission)  Pt Overview and Major Events to Date:  11/05: Admitted for ACS r/o 11/06: Nuclear stress performed, intermediate risk, would benefit from cath but has AoCKD, gentle hydration 11/10: L-heart cath scheduled  Assessment and Plan: Pamela Campbell is a 55 y.o. female presenting with chest tightness and dyspnea on exertion. PMH is significant for untreated HTN, HLD, DM2, CKD3, HFpEF and NSTEMI (03/2016).  #Chest Pain, Acute, Resolved:  Continues to remain stable and asymptomatic. Heart score 5-6. Trop 0.07>0.08>0.07. Repeat EKG with continued nonspecific T wave changes. TSH 2.330. Nuclear stress performed showing EF 52%, no ST changes, intermediate risk and would benefit from cath but has AoCKD. ECHO 11/06 showed EF 55-60%, mod LVH, no wall motion abn and G2DD. Providing gentle fluids. Cardiology to L-heart cath 11/10. --Cardiology following, appreciate recommendations --Coreg 12.5 mg BID --Amlodipine 10 mg QD --Atorvastatin 80 mg QD, check lipid panel in 4 weeks after d/c --Telemetry --Continuous pulse ox --Morphine 4 mg/mL IV q2h PRN severe pain --Zofran q6h PRN nausea --Aspirin 81 mg QD  #Hypertensive Emergency, Acute, Resolved: BP overnight 150-170/60-80. AoCKD and what looks to be demand ischemia.  Previously on ARB, BB, Thiazide, but not taken antihypertensives in 2 years. Creatinine continues to improve. --Coreg 12.5 mg BID --Amlodipine 10 mg QD --Increasing Hydralazine 50 mg TID per Nephrology recs --Consider ACE-I/ARB (once AKI improved since has CKD3/DM2) as appropriate outpatient --Trend repeat BMP in AM  #Acute on Chronic Kidney Disease, Stage 3, Uncontrolled:  Creatinine  stable 2.04>2.07 as of 11/10. Baseline Cr 1.25-1.34. Injury likely related to uncontrolled HTN. FENa 0.3% pre-renal. Renal U/S w/o hydronephrosis or bladder distention. --Avoid nephrotoxic agents --Conservative control of BP to avoid hypoperfusion to kidneys --Trend repeat BMP in AM --NS @125cc /h started at 0555 with anticipated L-heart cath --Urine micro shows many bacteria but UA only pertinent for >300 protein and 250 glucose, UCx pending, on Keflex 500 mg BID (day 4)  #Diabetes Mellitus Type 2, Chronic, Uncontrolled:  Glucose appropriate in 140s. A1c 10.2. Not taken medication in over 2 years. Previously on Lantus. --sSSI --CBG QAC and QHS --Consult CM for assistance with meds/ PCP --Consider GLP on d/c for PCP f/u  #Hyperlipidemia, Chronic, Uncontrolled:   Elevated total cholesterol 323, LDL 206, HDL70. ASCVD risk 27.0%. High intensity statin recommended. Risk factors include h/o possible NSTEMI, uncontrolled HTN, uncontrolled DM2, h/o smoking. +Paternal history of early MI (44). --Lipitor 80 mg QD  #HFpEF, Chronic, Stable:  Nuclear stress performed 11/06 showed preserved LVEF 45-54% and w/o ST changes. BNP 144.9, CXR no pulmonary edema. ECHO 11/06 showed EF 55-60%, mod LVH, no wall motion abn and G2DD. Receiving IVF but contiue to not show signs of fluid overload. UO -1.2L over last 24 hrs. Wt increased 2 lb since yesterday, wt is up 10 lbs from admission. Given low albumin 2.3, pt could be 3rd spacing.  #Sleep Apnea, Chronic, Uncontrolled:  Per chart review. Patient denies she has this and notes that she does not use CPAP at home.  FEN/GI: Heart healthy/ Carb mod diet, gentle hydration NS @50cc /h Prophylaxis: Lovenox SQ  Disposition: Pending improvement AoCKD for cardiac cath scheduled 11/10   Subjective:  No overnight events. Patient reports that she is feeling well today. Denies chest tightness, dyspnea,  visual disturbance, nausea. Patient is hopeful of having cath done  today and leaving soon.   Objective: Temp:  [98.1 F (36.7 C)-98.6 F (37 C)] 98.6 F (37 C) (11/10 0407) Pulse Rate:  [67-74] 73 (11/10 0407) Resp:  [18] 18 (11/10 0407) BP: (119-189)/(66-84) 163/81 (11/10 0407) SpO2:  [98 %-99 %] 99 % (11/10 0407) Weight:  [272 lb 7.8 oz (123.6 kg)] 272 lb 7.8 oz (123.6 kg) (11/10 0407) Physical Exam: General: standing in room walking, well nourished, well developed, in no acute distress with non-toxic appearance HEENT: normocephalic, atraumatic, moist mucous membranes CV: regular rate and rhythm without murmurs, rubs, or gallops Lungs: clear to auscultation bilaterally with normal work of breathing Skin: warm, dry, no rashes or lesions, cap refill < 2 seconds Extremities: warm and well perfused, normal tone, no pitting edema  Laboratory:  Recent Labs Lab 10/27/16 0449 10/28/16 0312 10/29/16 0300  WBC 10.4 11.0* 10.9*  HGB 10.7* 11.0* 11.1*  HCT 34.1* 34.4* 34.5*  PLT 259 273 283    Recent Labs Lab 10/28/16 0312 10/29/16 0300 10/30/16 0442  NA 137 137 138  K 4.0 4.0 3.7  CL 107 105 109  CO2 22 24 21*  BUN 37* 32* 36*  CREATININE 2.30* 2.04* 2.07*  CALCIUM 8.6* 8.8* 8.3*  GLUCOSE 129* 148* 131*    Cardiac Panel (last 3 results) No results for input(s): CKTOTAL, CKMB, TROPONINI, RELINDX in the last 72 hours. Lipid Panel     Component Value Date/Time   CHOL 323 (H) 10/24/2016 1822   TRIG 236 (H) 10/24/2016 1822   HDL 70 10/24/2016 1822   CHOLHDL 4.6 10/24/2016 1822   VLDL 47 (H) 10/24/2016 1822   LDLCALC 206 (H) 10/24/2016 1822    Imaging/Diagnostic Tests: DG Chest 2 View (10/24/16) FINDINGS: Cardiomegaly is stable from prior. Mediastinal silhouette within normal limits. Lungs normally inflated. Central vascular congestion without overt pulmonary edema. No pleural effusion. No focal infiltrates identified. No pneumothorax. No acute osseous abnormality.  IMPRESSION: 1. Stable cardiomegaly with central perihilar  vascular congestion without overt pulmonary edema. 2. No other active cardiopulmonary disease.  US Renal (10/26/16) FINDINGS: Right Kidney: Length: 11.1 cm. Increased echogenicity consistent chronic medical renal disease. No mass or hydronephrosis visualized. Left Kidney: Length: 11.2 cm. Increased echogenicity consistent chronic medical renal disease. No mass or hydronephrosis visualized. Bladder: Appears normal for degree of bladder distention.  IMPRESSION: 1. Increased echogenicity both kidneys consistent chronic medical renal disease. 2.  No acute abnormality.  No hydronephrosis bladder distention.  ECHO (10/26/16) Study Conclusions - Left ventricle: The cavity size was normal. Wall thickness was   increased in a pattern of moderate LVH. Systolic function was   normal. The estimated ejection fraction was in the range of 55%   to 60%. Wall motion was normal; there were no regional wall   motion abnormalities. Features are consistent with a pseudonormal   left ventricular filling pattern, with concomitant abnormal   relaxation and increased filling pressure (grade 2 diastolic   dysfunction). - Aortic valve: Trileaflet; mildly thickened, mildly calcified   leaflets. - Mitral valve: There was trivial regurgitation. - Left atrium: The atrium was mildly dilated.  NM Myocar Multi W/Spect W/Wall Motion / EF (10/26/16)  There was no ST segment deviation noted during stress.  No T wave inversion was noted during stress.  Defect 1: There is a medium defect of mild severity present in the mid anterior and mid anteroseptal location.  The left ventricular ejection fraction is mildly decreased (  45-54%).  Nuclear stress EF: 52%.   Intermediate risk, equivocal study. There is a reversible mid anterior wall defect that may represent shifting breast attenuation artifact, but ischemia in a branch of the LAD artery cannot be confidently excluded. (read as shifting  breast artifact per  Dr. Acie Fredrickson) Borderline reduced left ventricular systolic function. Abnormal ECG at baseline.  [place] Bing, DO 10/30/2016, 7:00 AM PGY-1, [place] Intern pager: [phone], text pages welcome

## 2016-10-31 DIAGNOSIS — N170 Acute kidney failure with tubular necrosis: Secondary | ICD-10-CM

## 2016-10-31 DIAGNOSIS — R9439 Abnormal result of other cardiovascular function study: Secondary | ICD-10-CM

## 2016-10-31 LAB — CREATININE, SERUM
CREATININE: 2.07 mg/dL — AB (ref 0.44–1.00)
GFR calc Af Amer: 30 mL/min — ABNORMAL LOW (ref >60)
GFR, EST NON AFRICAN AMERICAN: 26 mL/min — AB (ref >60)

## 2016-10-31 LAB — GLUCOSE, CAPILLARY
GLUCOSE-CAPILLARY: 176 mg/dL — AB (ref 65–99)
Glucose-Capillary: 109 mg/dL — ABNORMAL HIGH (ref 65–99)
Glucose-Capillary: 142 mg/dL — ABNORMAL HIGH (ref 65–99)
Glucose-Capillary: 124 mg/dL — ABNORMAL HIGH (ref 65–99)

## 2016-10-31 MED ORDER — ISOSORBIDE MONONITRATE ER 30 MG PO TB24
30.0000 mg | ORAL_TABLET | Freq: Every day | ORAL | Status: DC
  Administered 2016-10-31 – 2016-11-01 (×2): 30 mg via ORAL
  Filled 2016-10-31 (×2): qty 1

## 2016-10-31 NOTE — Progress Notes (Signed)
Patient Name: Pamela Campbell Date of Encounter: 10/31/2016  Primary Cardiologist: Dr. Sande Rives Problem List     Principal Problem:   Chest pain due to myocardial ischemia [facility]) Active Problems:   Hypertensive heart disease   Chronic diastolic congestive heart failure (HCC)   Accelerated hypertension   Obesity-BMI 42   Sleep apnea   Dyslipidemia   Type 2 diabetes mellitus (HCC)   CKD (chronic kidney disease) stage 3, GFR 30-59 ml/min   Hypertensive emergency   Acute-on-chronic kidney injury ([place])   Acute kidney injury ([place])   Unstable angina pectoris ([place])   Essential hypertension   Subjective   Feels ok, denies chest pain, SOB has improved since yesterday.   Inpatient Medications    Scheduled Meds: . amLODipine  10 mg Oral Daily  . aspirin EC  81 mg Oral Daily  . atorvastatin  80 mg Oral q1800  . carvedilol  12.5 mg Oral BID WC  . cephALEXin  500 mg Oral Q12H  . enoxaparin (LOVENOX) injection  40 mg Subcutaneous Q24H  . hydrALAZINE  50 mg Oral Q8H  . insulin aspart  0-9 Units Subcutaneous TID WC   Continuous Infusions:  PRN Meds: acetaminophen, gi cocktail, hydrALAZINE, magnesium hydroxide, ondansetron (ZOFRAN) IV, technetium tetrofosmin   Vital Signs    Vitals:   10/30/16 1803 10/30/16 2100 10/31/16 0449 10/31/16 1016  BP: (!) 157/85 133/66 130/69 (!) 159/84  Pulse: 67 68 70 66  Resp:   18   Temp:  98 F (36.7 C) 98 F (36.7 C)   TempSrc:  Oral Oral   SpO2:  97% 99%   Weight:   269 lb 6.4 oz (122.2 kg)   Height:        Intake/Output Summary (Last 24 hours) at 10/31/16 1107 Last data filed at 10/31/16 1020  Gross per 24 hour  Intake             1340 ml  Output             3675 ml  Net            -2335 ml   Filed Weights   10/29/16 0547 10/30/16 0407 10/31/16 0449  Weight: 270 lb 6.4 oz (122.7 kg) 272 lb 7.8 oz (123.6 kg) 269 lb 6.4 oz (122.2 kg)    Physical Exam    GEN: Well nourished, well developed, in no acute distress.    HEENT: Grossly normal.  Neck: Supple, no JVD, carotid bruits, or masses. Cardiac: RRR, no murmurs, rubs, or gallops. No clubbing, cyanosis, edema.  Radials/DP/PT 2+ and equal bilaterally.  Respiratory:  Respirations regular and unlabored, clear to auscultation bilaterally. GI: Soft, nontender, nondistended, BS + x 4. MS: no deformity or atrophy. Skin: warm and dry, no rash. Neuro:  Strength and sensation are intact. Psych: AAOx3.  Normal affect.  Labs    CBC  Recent Labs  10/29/16 0300  WBC 10.9*  HGB 11.1*  HCT 34.5*  MCV 82.9  PLT 035   Basic Metabolic Panel  Recent Labs  10/29/16 0300 10/30/16 0442 10/30/16 1500 10/31/16 0509  NA 137 138 137  --   K 4.0 3.7 3.8  --   CL 105 109 108  --   CO2 24 21* 20*  --   GLUCOSE 148* 131* 122*  --   BUN 32* 36* 30*  --   CREATININE 2.04* 2.07* 1.99* 2.07*  CALCIUM 8.8* 8.3* 8.5*  --   PHOS 3.8  --   --   --  Liver Function Tests  Recent Labs  10/29/16 0300  ALBUMIN 2.3*   Telemetry    NSR   ECG    NSR with inferior T wave inversion  Radiology    No results found.  Cardiac Studies   Lexiscan Myoview   There was no ST segment deviation noted during stress.  No T wave inversion was noted during stress.  Defect 1: There is a medium defect of mild severity present in the mid anterior and mid anteroseptal location.  The left ventricular ejection fraction is mildly decreased (45-54%).  Nuclear stress EF: 52%.   Intermediate risk, equivocal study. There is a reversible mid anterior wall defect that may represent shifting breast attenuation artifact, but ischemia in a branch of the LAD artery cannot be confidently excluded. Borderline reduced left ventricular systolic function. Abnormal ECG at baseline   Patient Profile     Morbidly obese AA female with a history of chronic diastolic CHF, HTN, HLD, Type 2 DM, and Stage 3 CKD who presented to [name] ED on 10/24/2016 for evaluation of chest pain. She  was hypertensive in the ED, reported non compliance with antihypertensive medications for the past 1+ years. Cyclic troponin values have been 0.07, 0.08, and 0.07.   Assessment & Plan    1. Chest pain - Resolved.  myoview showed an anterior defect that may be shifting breast artifact but may also be ischemia .  Scheduled for a LHC yesterday, but developed acute SOB and needed to be diuresed, now improved, with Crea 2.0, I will hold fluids and diuretics, recheck Crea in the am.  2 Hypertension- blood pressure elevated today. Add imdur 30 mg po daily.    3 Acute on chronic stage III kidney disease- we'll hold on HCTZ and ARB for now. Would add later has renal function stabilizes. Cr improving with gentle IVF.   4 severe hyperlipidemia-TSH WNL. Lipitor 80 mg daily added. Check lipids and liver in 4 weeks.  5 Type 2 IDDM-per primary  Signed, Ena Dawley, MD  10/31/2016 11:07 AM    [facility] [place],  [place], [place]  [address] Pager [phone] Phone: [phone]; Fax: [phone]

## 2016-10-31 NOTE — Progress Notes (Signed)
Family Medicine Teaching Service Daily Progress Note Intern Pager: [phone]  Patient name: Pamela Campbell Medical record number: [id number] Date of birth: 05-Jul-1961 Age: 55 y.o. Gender: female  Primary Care Provider: No PCP Per Patient Consultants: Cardiology Code Status: FULL (confirmed on admission)  Pt Overview and Major Events to Date:  11/05: Admitted for ACS r/o 11/06: Nuclear stress performed, intermediate risk, would benefit from cath but has AoCKD, gentle hydration  Assessment and Plan: Pamela Campbell is a 55 y.o. female presenting with chest tightness and dyspnea on exertion. PMH is significant for untreated HTN, HLD, DM2, CKD3, HFpEF and NSTEMI (03/2016).  #Chest Pain, Acute, Resolved:  Continues to remain stable and asymptomatic. Heart score 5-6. Trop 0.07>0.08>0.07. Repeat EKG with continued nonspecific T wave changes. TSH 2.330. Nuclear stress performed showing EF 52%, no ST changes, intermediate risk and would benefit from cath but has AoCKD. ECHO 11/06 showed EF 55-60%, mod LVH, no wall motion abn and G2DD.  --Cardiology following, appreciate recommendations. Plan for [facility] Monday. --Coreg 12.5 mg BID --Amlodipine 10 mg QD --Atorvastatin 80 mg QD, check lipid panel in 4 weeks after d/c --Telemetry --Continuous pulse ox --Morphine 4 mg/mL IV q2h PRN severe pain --Zofran q6h PRN nausea --Aspirin 81 mg QD  #Volume Overload in HFpEF: s/p IVFs at 225ml/hr, now off fluids. Unable to lie flat for cath. Not on diuretics at home. UOP 3.875L in last 24 hr. Net negative 3.1L since admission. Weight 269lb, down from 272lb on 11/11 (262lb on admission). Able to lay flat today. --Received Lasix 40mg  x 1 on 11/11. Hold off on additional doses because don't want to make kidney function worse. --Hold fluids. Encouraged PO.  #Acute on Chronic Kidney Disease, Stage 3, Uncontrolled:  Creatinine bumped slightly from 1.99 > 2.07. Baseline Cr 1.25-1.34. Injury likely related to uncontrolled  HTN. May have bumped again after receiving Lasix. FENa 0.3% pre-renal. Renal U/S w/o hydronephrosis or bladder distention. --Avoid nephrotoxic agents --Conservative control of BP to avoid hypoperfusion to kidneys --Daily BMPs  #Presumed UTI: Urine micro shows many bacteria but UA only pertinent for >300 protein and 250 glucose. UCx with multiple species. Pt has been on abx, so will not recollect. --Keflex 500 mg BID x 7 days. Today is Day 5.  #Hypertensive Emergency, Acute, Resolved: BPs improved to 130/69 this morning. AoCKD and what looks to be demand ischemia.  Previously on ARB, BB, Thiazide, but not taken antihypertensives in 2 years.  --Coreg 12.5 mg BID --Amlodipine 10 mg QD --Hydralazine 50 mg TID --Consider ACE-I/ARB (once AKI improved since has CKD3/DM2) as appropriate outpatient --Daily BMPs  #Diabetes Mellitus Type 2, Chronic, Uncontrolled:  CBGs 109-135. A1c 10.2. Not taken medication in over 2 years. Previously on Lantus. --sSSI --CBG QAC and QHS --Consult CM for assistance with meds/ PCP --Consider GLP on d/c for PCP f/u  #Hyperlipidemia, Chronic, Uncontrolled:   Elevated total cholesterol 323, LDL 206, HDL70. ASCVD risk 27.0%. High intensity statin recommended. Risk factors include h/o possible NSTEMI, uncontrolled HTN, uncontrolled DM2, h/o smoking. +Paternal history of early MI (66). --Lipitor 80 mg QD  #Sleep Apnea, Chronic, Uncontrolled:  Per chart review. Patient denies she has this and notes that she does not use CPAP at home.  FEN/GI: Heart healthy/ Carb mod diet Prophylaxis: Lovenox SQ  Disposition: Awaiting possible cath on 11/13 pending renal function.  Subjective:  Pt states she is doing fine this morning. No chest pain. She feels well. She is ready for her LHC on Monday.  Objective:  Temp:  [98 F (36.7 C)] 98 F (36.7 C) (11/11 0449) Pulse Rate:  [62-70] 70 (11/11 0449) Resp:  [18] 18 (11/11 0449) BP: (130-183)/(66-99) 130/69 (11/11  0449) SpO2:  [97 %-99 %] 99 % (11/11 0449) Weight:  [269 lb 6.4 oz (122.2 kg)] 269 lb 6.4 oz (122.2 kg) (11/11 0449) Physical Exam: General: Laying flat in bed, in NAD, pleasant. HEENT: normocephalic, atraumatic, moist mucous membranes CV: regular rate and rhythm without murmurs, rubs, or gallops Lungs: Normal work of breathing, very mild crackles in lung bases bilaterally Skin: warm, dry, no rashes or lesions, cap refill < 2 seconds Extremities: Pitting edema almost to the knee.  Laboratory:  Recent Labs Lab 10/27/16 0449 10/28/16 0312 10/29/16 0300  WBC 10.4 11.0* 10.9*  HGB 10.7* 11.0* 11.1*  HCT 34.1* 34.4* 34.5*  PLT 259 273 283    Recent Labs Lab 10/29/16 0300 10/30/16 0442 10/30/16 1500 10/31/16 0509  NA 137 138 137  --   K 4.0 3.7 3.8  --   CL 105 109 108  --   CO2 24 21* 20*  --   BUN 32* 36* 30*  --   CREATININE 2.04* 2.07* 1.99* 2.07*  CALCIUM 8.8* 8.3* 8.5*  --   GLUCOSE 148* 131* 122*  --     Cardiac Panel (last 3 results) No results for input(s): CKTOTAL, CKMB, TROPONINI, RELINDX in the last 72 hours. Lipid Panel     Component Value Date/Time   CHOL 323 (H) 10/24/2016 1822   TRIG 236 (H) 10/24/2016 1822   HDL 70 10/24/2016 1822   CHOLHDL 4.6 10/24/2016 1822   VLDL 47 (H) 10/24/2016 1822   LDLCALC 206 (H) 10/24/2016 1822    Imaging/Diagnostic Tests: DG Chest 2 View (10/24/16) FINDINGS: Cardiomegaly is stable from prior. Mediastinal silhouette within normal limits. Lungs normally inflated. Central vascular congestion without overt pulmonary edema. No pleural effusion. No focal infiltrates identified. No pneumothorax. No acute osseous abnormality.  IMPRESSION: 1. Stable cardiomegaly with central perihilar vascular congestion without overt pulmonary edema. 2. No other active cardiopulmonary disease.  US Renal (10/26/16) FINDINGS: Right Kidney: Length: 11.1 cm. Increased echogenicity consistent chronic medical renal disease. No mass or  hydronephrosis visualized. Left Kidney: Length: 11.2 cm. Increased echogenicity consistent chronic medical renal disease. No mass or hydronephrosis visualized. Bladder: Appears normal for degree of bladder distention.  IMPRESSION: 1. Increased echogenicity both kidneys consistent chronic medical renal disease. 2.  No acute abnormality.  No hydronephrosis bladder distention.  ECHO (10/26/16) Study Conclusions - Left ventricle: The cavity size was normal. Wall thickness was   increased in a pattern of moderate LVH. Systolic function was   normal. The estimated ejection fraction was in the range of 55%   to 60%. Wall motion was normal; there were no regional wall   motion abnormalities. Features are consistent with a pseudonormal   left ventricular filling pattern, with concomitant abnormal   relaxation and increased filling pressure (grade 2 diastolic   dysfunction). - Aortic valve: Trileaflet; mildly thickened, mildly calcified   leaflets. - Mitral valve: There was trivial regurgitation. - Left atrium: The atrium was mildly dilated.  NM Myocar Multi W/Spect W/Wall Motion / EF (10/26/16)  There was no ST segment deviation noted during stress.  No T wave inversion was noted during stress.  Defect 1: There is a medium defect of mild severity present in the mid anterior and mid anteroseptal location.  The left ventricular ejection fraction is mildly decreased (45-54%).  Nuclear stress EF:  52%.   Intermediate risk, equivocal study. There is a reversible mid anterior wall defect that may represent shifting breast attenuation artifact, but ischemia in a branch of the LAD artery cannot be confidently excluded. (read as shifting  breast artifact per Dr. Acie Fredrickson) Borderline reduced left ventricular systolic function. Abnormal ECG at baseline.  Sela Hua, MD 10/31/2016, 8:25 AM PGY-2, [place] Intern pager: [phone], text pages welcome

## 2016-10-31 NOTE — Progress Notes (Signed)
Patient refused bed alarm. Will continue to monitor patient. 

## 2016-11-01 DIAGNOSIS — I2510 Atherosclerotic heart disease of native coronary artery without angina pectoris: Secondary | ICD-10-CM

## 2016-11-01 DIAGNOSIS — R9439 Abnormal result of other cardiovascular function study: Secondary | ICD-10-CM

## 2016-11-01 LAB — BASIC METABOLIC PANEL
ANION GAP: 8 (ref 5–15)
BUN: 31 mg/dL — ABNORMAL HIGH (ref 6–20)
CHLORIDE: 108 mmol/L (ref 101–111)
CO2: 20 mmol/L — AB (ref 22–32)
Calcium: 8.4 mg/dL — ABNORMAL LOW (ref 8.9–10.3)
Creatinine, Ser: 2.13 mg/dL — ABNORMAL HIGH (ref 0.44–1.00)
GFR calc non Af Amer: 25 mL/min — ABNORMAL LOW (ref >60)
GFR, EST AFRICAN AMERICAN: 29 mL/min — AB (ref >60)
Glucose, Bld: 120 mg/dL — ABNORMAL HIGH (ref 65–99)
Potassium: 4.2 mmol/L (ref 3.5–5.1)
Sodium: 136 mmol/L (ref 135–145)

## 2016-11-01 LAB — GLUCOSE, CAPILLARY
Glucose-Capillary: 118 mg/dL — ABNORMAL HIGH (ref 65–99)
Glucose-Capillary: 135 mg/dL — ABNORMAL HIGH (ref 65–99)
Glucose-Capillary: 144 mg/dL — ABNORMAL HIGH (ref 65–99)
Glucose-Capillary: 173 mg/dL — ABNORMAL HIGH (ref 65–99)

## 2016-11-01 MED ORDER — ISOSORBIDE MONONITRATE ER 60 MG PO TB24
60.0000 mg | ORAL_TABLET | Freq: Every day | ORAL | Status: DC
  Administered 2016-11-02: 60 mg via ORAL
  Filled 2016-11-01: qty 1

## 2016-11-01 MED ORDER — FUROSEMIDE 10 MG/ML IJ SOLN: 40.0000 mg | Freq: Two times a day (BID) | INTRAMUSCULAR | Status: DC

## 2016-11-01 MED ORDER — FUROSEMIDE 10 MG/ML IJ SOLN
40.0000 mg | Freq: Once | INTRAMUSCULAR | Status: AC
  Administered 2016-11-01: 40 mg via INTRAVENOUS
  Filled 2016-11-01: qty 4

## 2016-11-01 NOTE — Progress Notes (Signed)
Family Medicine Teaching Service Daily Progress Note Intern Pager: [phone]  Patient name: Pamela Campbell Medical record number: [id number] Date of birth: 03-18-1961 Age: 55 y.o. Gender: female  Primary Care Provider: No PCP Per Patient Consultants: Cardiology Code Status: FULL (confirmed on admission)  Pt Overview and Major Events to Date:  11/05: Admitted for ACS r/o 11/06: Nuclear stress performed, intermediate risk, would benefit from cath but has AoCKD  Assessment and Plan: Pamela Campbell is a 55 y.o. female presenting with chest tightness and dyspnea on exertion. PMH is significant for untreated HTN, HLD, DM2, CKD3, HFpEF and NSTEMI (03/2016).  #Chest Pain, Acute, Resolved:  Nuclear stress performed showing EF 52%, no ST changes, intermediate risk and would benefit from cath but has AoCKD. ECHO 11/06 showed EF 55-60%, mod LVH, no wall motion abn and G2DD. No chest pain this morning. --Cardiology following, appreciate recommendations. Plan for [facility] Monday, pending Cr. Planning to give another dose of IV Lasix 40mg  x 1 today. --Coreg 12.5 mg BID --Amlodipine 10 mg QD --Imdur 30mg  QD added 11/11 --Atorvastatin 80 mg QD, check lipid panel in 4 weeks after d/c --Telemetry --Continuous pulse ox --Morphine 4 mg/mL IV q2h PRN severe pain --Zofran q6h PRN nausea --Aspirin 81 mg QD  #Acute on Chronic Kidney Disease, Stage 3, Uncontrolled:  Creatinine bumped again today from 2.07 > 2.13. Baseline Cr 1.25-1.34. Injury likely related to uncontrolled HTN. FENa 0.3% pre-renal. Renal U/S w/o hydronephrosis or bladder distention. --Avoid nephrotoxic agents --Conservative control of BP to avoid hypoperfusion to kidneys --Daily BMPs  #Volume Overload in HFpEF: s/p IVFs at 268ml/hr, now off fluids. Unable to lie flat for cath. Not on diuretics at home. UOP 1.05L in last 24 hr. Net negative 3.8L since admission. Weight 269lb today, down from 272lb on 11/11 (262lb on admission). Able to lay flat  today. --Received Lasix 40mg  x 1 on 11/10. Cards giving another dose of Lasix 40mg  IV x 1. --Encouraged PO  #Presumed UTI: Urine micro shows many bacteria but UA only pertinent for >300 protein and 250 glucose. UCx with multiple species. Pt has been on abx, so will not recollect. --Keflex 500 mg BID x 7 days. Today is Day 6.  #Hypertensive Emergency, Acute, Resolved: BPs improved to 135/64 this morning. AoCKD and what looks to be demand ischemia.  Previously on ARB, BB, Thiazide, but not taken antihypertensives in 2 years.  --Coreg 12.5 mg BID --Amlodipine 10 mg QD --Hydralazine 50 mg TID --Cards added Imdur 30mg  QD --Consider ACE-I/ARB (once AKI improved since has CKD3/DM2) as appropriate outpatient.  --Daily BMPs  #Diabetes Mellitus Type 2, Chronic, Uncontrolled:  CBGs 109-135. A1c 10.2. Not taken medication in over 2 years. Previously on Lantus. --sSSI --CBG QAC and QHS --Consult CM for assistance with meds/ PCP --Consider GLP on d/c for PCP f/u  #Hyperlipidemia, Chronic, Uncontrolled:   Elevated total cholesterol 323, LDL 206, HDL70. ASCVD risk 27.0%. High intensity statin recommended. Risk factors include h/o possible NSTEMI, uncontrolled HTN, uncontrolled DM2, h/o smoking. +Paternal history of early MI (30). --Lipitor 80 mg QD  #Sleep Apnea, Chronic, Uncontrolled:  Per chart review. Patient denies she has this and notes that she does not use CPAP at home.  FEN/GI: Heart healthy/ Carb mod diet Prophylaxis: Lovenox SQ  Disposition: Awaiting possible cath on 11/13 pending renal function.  Subjective:  Pt continues to do well. She has no concerns this morning. She states she is going to try to drink plenty of water today. No chest pain.  Objective: Temp:  [97.7 F (36.5 C)-98.7 F (37.1 C)] 98.1 F (36.7 C) (11/12 0535) Pulse Rate:  [58-69] 59 (11/12 0637) Resp:  [18-20] 18 (11/12 0535) BP: (127-159)/(57-84) 135/64 (11/12 0637) SpO2:  [97 %-98 %] 97 % (11/12  0535) Weight:  [269 lb 11.2 oz (122.3 kg)] 269 lb 11.2 oz (122.3 kg) (11/12 0535) Physical Exam: General: Laying flat in bed, in NAD, pleasant, talkative. HEENT: normocephalic, atraumatic, moist mucous membranes CV: regular rate and rhythm without murmurs, rubs, or gallops Lungs: Normal work of breathing, very mild crackles in lung bases bilaterally Skin: warm, dry, no rashes or lesions, cap refill < 2 seconds Extremities: Pitting edema to the mid shin.  Laboratory:  Recent Labs Lab 10/27/16 0449 10/28/16 0312 10/29/16 0300  WBC 10.4 11.0* 10.9*  HGB 10.7* 11.0* 11.1*  HCT 34.1* 34.4* 34.5*  PLT 259 273 283    Recent Labs Lab 10/30/16 0442 10/30/16 1500 10/31/16 0509 11/01/16 0347  NA 138 137  --  136  K 3.7 3.8  --  4.2  CL 109 108  --  108  CO2 21* 20*  --  20*  BUN 36* 30*  --  31*  CREATININE 2.07* 1.99* 2.07* 2.13*  CALCIUM 8.3* 8.5*  --  8.4*  GLUCOSE 131* 122*  --  120*    Cardiac Panel (last 3 results) No results for input(s): CKTOTAL, CKMB, TROPONINI, RELINDX in the last 72 hours. Lipid Panel     Component Value Date/Time   CHOL 323 (H) 10/24/2016 1822   TRIG 236 (H) 10/24/2016 1822   HDL 70 10/24/2016 1822   CHOLHDL 4.6 10/24/2016 1822   VLDL 47 (H) 10/24/2016 1822   LDLCALC 206 (H) 10/24/2016 1822    Imaging/Diagnostic Tests: DG Chest 2 View (10/24/16) FINDINGS: Cardiomegaly is stable from prior. Mediastinal silhouette within normal limits. Lungs normally inflated. Central vascular congestion without overt pulmonary edema. No pleural effusion. No focal infiltrates identified. No pneumothorax. No acute osseous abnormality.  IMPRESSION: 1. Stable cardiomegaly with central perihilar vascular congestion without overt pulmonary edema. 2. No other active cardiopulmonary disease.  US Renal (10/26/16) FINDINGS: Right Kidney: Length: 11.1 cm. Increased echogenicity consistent chronic medical renal disease. No mass or hydronephrosis  visualized. Left Kidney: Length: 11.2 cm. Increased echogenicity consistent chronic medical renal disease. No mass or hydronephrosis visualized. Bladder: Appears normal for degree of bladder distention.  IMPRESSION: 1. Increased echogenicity both kidneys consistent chronic medical renal disease. 2.  No acute abnormality.  No hydronephrosis bladder distention.  ECHO (10/26/16) Study Conclusions - Left ventricle: The cavity size was normal. Wall thickness was   increased in a pattern of moderate LVH. Systolic function was   normal. The estimated ejection fraction was in the range of 55%   to 60%. Wall motion was normal; there were no regional wall   motion abnormalities. Features are consistent with a pseudonormal   left ventricular filling pattern, with concomitant abnormal   relaxation and increased filling pressure (grade 2 diastolic   dysfunction). - Aortic valve: Trileaflet; mildly thickened, mildly calcified   leaflets. - Mitral valve: There was trivial regurgitation. - Left atrium: The atrium was mildly dilated.  NM Myocar Multi W/Spect W/Wall Motion / EF (10/26/16)  There was no ST segment deviation noted during stress.  No T wave inversion was noted during stress.  Defect 1: There is a medium defect of mild severity present in the mid anterior and mid anteroseptal location.  The left ventricular ejection fraction is mildly decreased (  45-54%).  Nuclear stress EF: 52%.   Intermediate risk, equivocal study. There is a reversible mid anterior wall defect that may represent shifting breast attenuation artifact, but ischemia in a branch of the LAD artery cannot be confidently excluded. (read as shifting  breast artifact per Dr. Acie Fredrickson) Borderline reduced left ventricular systolic function. Abnormal ECG at baseline.  Sela Hua, MD 11/01/2016, 7:23 AM PGY-2, [place] Intern pager: [phone], text pages welcome

## 2016-11-01 NOTE — Progress Notes (Signed)
Patient Name: Pamela Campbell Date of Encounter: 11/01/2016  Primary Cardiologist: Dr. Sande Rives Problem List     Principal Problem:   Chest pain with high risk for cardiac etiology Active Problems:   Hypertensive heart disease   Chronic diastolic congestive heart failure (HCC)   Accelerated hypertension   Obesity-BMI 42   Sleep apnea   Dyslipidemia   Type 2 diabetes mellitus (HCC)   CKD (chronic kidney disease) stage 3, GFR 30-59 ml/min   Hypertensive emergency   Acute-on-chronic kidney injury ([place])   Acute kidney injury ([place])   Unstable angina pectoris ([place])   Essential hypertension   Subjective   Feels ok, denies chest pain, no SOB since the episode of Friday.   Inpatient Medications    Scheduled Meds: . amLODipine  10 mg Oral Daily  . aspirin EC  81 mg Oral Daily  . atorvastatin  80 mg Oral q1800  . carvedilol  12.5 mg Oral BID WC  . cephALEXin  500 mg Oral Q12H  . enoxaparin (LOVENOX) injection  40 mg Subcutaneous Q24H  . hydrALAZINE  50 mg Oral Q8H  . insulin aspart  0-9 Units Subcutaneous TID WC  . isosorbide mononitrate  30 mg Oral Daily   Continuous Infusions:  PRN Meds: acetaminophen, gi cocktail, hydrALAZINE, ondansetron (ZOFRAN) IV, technetium tetrofosmin   Vital Signs    Vitals:   10/31/16 1933 11/01/16 0535 11/01/16 0637 11/01/16 0823  BP: (!) 154/84 (!) 127/57 135/64 (!) 160/80  Pulse: 63 (!) 58 (!) 59 69  Resp: 18 18    Temp: 98.7 F (37.1 C) 98.1 F (36.7 C)    TempSrc: Oral Oral    SpO2: 98% 97%    Weight:  269 lb 11.2 oz (122.3 kg)    Height:        Intake/Output Summary (Last 24 hours) at 11/01/16 0949 Last data filed at 11/01/16 0830  Gross per 24 hour  Intake              320 ml  Output             1450 ml  Net            -1130 ml   Filed Weights   10/30/16 0407 10/31/16 0449 11/01/16 0535  Weight: 272 lb 7.8 oz (123.6 kg) 269 lb 6.4 oz (122.2 kg) 269 lb 11.2 oz (122.3 kg)    Physical Exam    GEN: Well  nourished, well developed, in no acute distress.  HEENT: Grossly normal.  Neck: Supple, no JVD, carotid bruits, or masses. Cardiac: RRR, no murmurs, rubs, or gallops. No clubbing, cyanosis, edema.  Radials/DP/PT 2+ and equal bilaterally.  Respiratory:  Respirations regular and unlabored, clear to auscultation bilaterally. GI: Soft, nontender, nondistended, BS + x 4. MS: no deformity or atrophy. Skin: warm and dry, no rash. Neuro:  Strength and sensation are intact. Psych: AAOx3.  Normal affect.  Labs    CBC No results for input(s): WBC, NEUTROABS, HGB, HCT, MCV, PLT in the last 72 hours. Basic Metabolic Panel  Recent Labs  10/30/16 1500 10/31/16 0509 11/01/16 0347  NA 137  --  136  K 3.8  --  4.2  CL 108  --  108  CO2 20*  --  20*  GLUCOSE 122*  --  120*  BUN 30*  --  31*  CREATININE 1.99* 2.07* 2.13*  CALCIUM 8.5*  --  8.4*   Liver Function Tests No results for input(s):  AST, ALT, ALKPHOS, BILITOT, PROT, ALBUMIN in the last 72 hours. Telemetry    NSR   ECG    NSR with inferior T wave inversion  Radiology    No results found.  Cardiac Studies   Lexiscan Myoview   There was no ST segment deviation noted during stress.  No T wave inversion was noted during stress.  Defect 1: There is a medium defect of mild severity present in the mid anterior and mid anteroseptal location.  The left ventricular ejection fraction is mildly decreased (45-54%).  Nuclear stress EF: 52%.   Intermediate risk, equivocal study. There is a reversible mid anterior wall defect that may represent shifting breast attenuation artifact, but ischemia in a branch of the LAD artery cannot be confidently excluded. Borderline reduced left ventricular systolic function. Abnormal ECG at baseline   Patient Profile     Morbidly obese AA female with a history of chronic diastolic CHF, HTN, HLD, Type 2 DM, and Stage 3 CKD who presented to [name] ED on 10/24/2016 for evaluation of chest pain.  She was hypertensive in the ED, reported non compliance with antihypertensive medications for the past 1+ years. Cyclic troponin values have been 0.07, 0.08, and 0.07.   Assessment & Plan    1. Chest pain - Resolved.  myoview showed an anterior defect that may be shifting breast artifact but may also be ischemia . Scheduled for a LHC on Friday 11/10, but developed acute SOB and needed to be diuresed, now improved, with Crea 2.1, lasix held yesterday, I will give 40 mg x 1 today. Plan for a cath in the am.   2 Hypertension- blood pressure elevated today. Increase Imdur to 60 mg po daily.    3 Acute on chronic stage III kidney disease- we'll hold on HCTZ and ARB for now. Would add later has renal function stabilizes. Cr stable.   4 severe hyperlipidemia-TSH WNL. Lipitor 80 mg daily added. Check lipids and liver in 4 weeks.  5 Type 2 IDDM-per primary  Signed, Ena Dawley, MD  11/01/2016 9:49 AM    [facility],  [place], [place]  [id number] Pager [phone] Phone: [phone]; Fax: [phone]

## 2016-11-02 ENCOUNTER — Encounter (HOSPITAL_COMMUNITY): Payer: Self-pay | Admitting: Cardiovascular Disease

## 2016-11-02 ENCOUNTER — Encounter (HOSPITAL_COMMUNITY)
Admission: EM | Disposition: A | Discharge status: Home / Self Care | Payer: Self-pay | Source: Home / Self Care | Attending: Family Medicine

## 2016-11-02 HISTORY — PX: CARDIAC CATHETERIZATION: SHX172

## 2016-11-02 LAB — CBC
HEMATOCRIT: 32.6 % — AB (ref 36.0–46.0)
HEMOGLOBIN: 10.6 g/dL — AB (ref 12.0–15.0)
MCH: 26.8 pg (ref 26.0–34.0)
MCHC: 32.5 g/dL (ref 30.0–36.0)
MCV: 82.3 fL (ref 78.0–100.0)
PLATELETS: 271 10*3/uL (ref 150–400)
RBC: 3.96 MIL/uL (ref 3.87–5.11)
RDW: 13.7 % (ref 11.5–15.5)
WBC: 10.2 10*3/uL (ref 4.0–10.5)

## 2016-11-02 LAB — BASIC METABOLIC PANEL
Anion gap: 9 (ref 5–15)
BUN: 29 mg/dL — AB (ref 6–20)
CHLORIDE: 104 mmol/L (ref 101–111)
CO2: 23 mmol/L (ref 22–32)
CREATININE: 2.15 mg/dL — AB (ref 0.44–1.00)
Calcium: 8.5 mg/dL — ABNORMAL LOW (ref 8.9–10.3)
GFR calc Af Amer: 29 mL/min — ABNORMAL LOW (ref >60)
GFR, EST NON AFRICAN AMERICAN: 25 mL/min — AB (ref >60)
GLUCOSE: 140 mg/dL — AB (ref 65–99)
POTASSIUM: 3.5 mmol/L (ref 3.5–5.1)
Sodium: 136 mmol/L (ref 135–145)

## 2016-11-02 LAB — GLUCOSE, CAPILLARY
Glucose-Capillary: 131 mg/dL — ABNORMAL HIGH (ref 65–99)
Glucose-Capillary: 133 mg/dL — ABNORMAL HIGH (ref 65–99)

## 2016-11-02 SURGERY — LEFT HEART CATH AND CORONARY ANGIOGRAPHY
Anesthesia: LOCAL

## 2016-11-02 MED ORDER — IOPAMIDOL (ISOVUE-370) INJECTION 76%
INTRAVENOUS | Status: DC | PRN
  Administered 2016-11-02: 20 mL via INTRAVENOUS

## 2016-11-02 MED ORDER — AMLODIPINE BESYLATE 10 MG PO TABS: 10.0000 mg | ORAL_TABLET | Freq: Every day | ORAL | 2 refills | Status: DC

## 2016-11-02 MED ORDER — FUROSEMIDE 40 MG PO TABS
40.0000 mg | ORAL_TABLET | Freq: Two times a day (BID) | ORAL | Status: DC
  Administered 2016-11-02 (×2): 40 mg via ORAL
  Filled 2016-11-02 (×2): qty 1

## 2016-11-02 MED ORDER — HEPARIN (PORCINE) IN NACL 2-0.9 UNIT/ML-% IJ SOLN
INTRAMUSCULAR | Status: DC | PRN
  Administered 2016-11-02: 1000 mL

## 2016-11-02 MED ORDER — LIDOCAINE HCL (PF) 1 % IJ SOLN
INTRAMUSCULAR | Status: DC | PRN
  Administered 2016-11-02: 5 mL

## 2016-11-02 MED ORDER — MIDAZOLAM HCL 2 MG/2ML IJ SOLN
INTRAMUSCULAR | Status: AC
  Filled 2016-11-02: qty 2

## 2016-11-02 MED ORDER — VERAPAMIL HCL 2.5 MG/ML IV SOLN
INTRAVENOUS | Status: AC
  Filled 2016-11-02: qty 2

## 2016-11-02 MED ORDER — HEPARIN SODIUM (PORCINE) 1000 UNIT/ML IJ SOLN
INTRAMUSCULAR | Status: DC | PRN
  Administered 2016-11-02: 6000 [IU] via INTRAVENOUS

## 2016-11-02 MED ORDER — SODIUM CHLORIDE 0.9 % IV SOLN: 250.0000 mL | INTRAVENOUS | Status: DC | PRN

## 2016-11-02 MED ORDER — SODIUM CHLORIDE 0.9% FLUSH: 3.0000 mL | Freq: Two times a day (BID) | INTRAVENOUS | Status: DC

## 2016-11-02 MED ORDER — ATORVASTATIN CALCIUM 80 MG PO TABS: 80.0000 mg | ORAL_TABLET | Freq: Every day | ORAL | 0 refills | Status: DC

## 2016-11-02 MED ORDER — MIDAZOLAM HCL 2 MG/2ML IJ SOLN
INTRAMUSCULAR | Status: DC | PRN
  Administered 2016-11-02: 1 mg via INTRAVENOUS
  Administered 2016-11-02: 2 mg via INTRAVENOUS

## 2016-11-02 MED ORDER — GLIPIZIDE 5 MG PO TABS: 5.0000 mg | ORAL_TABLET | Freq: Every day | ORAL | 0 refills | Status: DC

## 2016-11-02 MED ORDER — IOPAMIDOL (ISOVUE-370) INJECTION 76%
INTRAVENOUS | Status: AC
  Filled 2016-11-02: qty 100

## 2016-11-02 MED ORDER — HEPARIN SODIUM (PORCINE) 1000 UNIT/ML IJ SOLN
INTRAMUSCULAR | Status: AC
  Filled 2016-11-02: qty 1

## 2016-11-02 MED ORDER — HEPARIN (PORCINE) IN NACL 2-0.9 UNIT/ML-% IJ SOLN
INTRAMUSCULAR | Status: AC
  Filled 2016-11-02: qty 1500

## 2016-11-02 MED ORDER — CARVEDILOL 25 MG PO TABS: ORAL_TABLET | ORAL | 2 refills | Status: DC

## 2016-11-02 MED ORDER — HEPARIN (PORCINE) IN NACL 2-0.9 UNIT/ML-% IJ SOLN
INTRAMUSCULAR | Status: DC | PRN
  Administered 2016-11-02: 10 mL via INTRA_ARTERIAL

## 2016-11-02 MED ORDER — LIDOCAINE HCL (PF) 1 % IJ SOLN
INTRAMUSCULAR | Status: AC
  Filled 2016-11-02: qty 30

## 2016-11-02 MED ORDER — SODIUM CHLORIDE 0.9% FLUSH
3.0000 mL | Freq: Two times a day (BID) | INTRAVENOUS | Status: DC
  Administered 2016-11-02: 3 mL via INTRAVENOUS

## 2016-11-02 MED ORDER — SODIUM CHLORIDE 0.9% FLUSH: 3.0000 mL | INTRAVENOUS | Status: DC | PRN

## 2016-11-02 MED ORDER — FENTANYL CITRATE (PF) 100 MCG/2ML IJ SOLN
INTRAMUSCULAR | Status: DC | PRN
  Administered 2016-11-02 (×2): 25 ug via INTRAVENOUS

## 2016-11-02 MED ORDER — FENTANYL CITRATE (PF) 100 MCG/2ML IJ SOLN
INTRAMUSCULAR | Status: AC
  Filled 2016-11-02: qty 2

## 2016-11-02 MED ORDER — FUROSEMIDE 40 MG PO TABS: 40.0000 mg | ORAL_TABLET | Freq: Two times a day (BID) | ORAL | 0 refills | Status: DC

## 2016-11-02 MED FILL — CARVEDILOL 25 MG TABLET: 25 | 30 days supply | Qty: 60 | Fill #0

## 2016-11-02 MED FILL — FUROSEMIDE 40 MG TABLET: 40 | 16 days supply | Qty: 30 | Fill #0

## 2016-11-02 MED FILL — glipiZIDE 5 MG TABS: 5 | 30 days supply | Qty: 30 | Fill #0

## 2016-11-02 MED FILL — AMLODIPINE BESYLATE 10 MG T: 10 | 30 days supply | Qty: 30 | Fill #0

## 2016-11-02 MED FILL — ATORVASTATIN 80 MG TABLET: 80 | 30 days supply | Qty: 30 | Fill #0

## 2016-11-02 SURGICAL SUPPLY — 10 items
CATH INFINITI 5 FR JL3.5 (CATHETERS) ×2 IMPLANT
CATH INFINITI JR4 5F (CATHETERS) ×2 IMPLANT
DEVICE RAD COMP TR BAND LRG (VASCULAR PRODUCTS) ×2 IMPLANT
GLIDESHEATH SLEND SS 6F .021 (SHEATH) ×2 IMPLANT
GUIDEWIRE INQWIRE 1.5J.035X260 (WIRE) ×1 IMPLANT
INQWIRE 1.5J .035X260CM (WIRE) ×2
KIT HEART LEFT (KITS) ×2 IMPLANT
PACK CARDIAC CATHETERIZATION (CUSTOM PROCEDURE TRAY) ×2 IMPLANT
TRANSDUCER W/STOPCOCK (MISCELLANEOUS) ×2 IMPLANT
TUBING CIL FLEX 10 FLL-RA (TUBING) ×2 IMPLANT

## 2016-11-02 NOTE — Discharge Instructions (Signed)
It was so nice to meet you!  Your cardiac catheterization showed that you only have very mild plaque in the arteries that bring blood to your heart.  To prevent future heart attacks and strokes, it is very important that you take your medications to help make sure your diabetes and blood pressures are very well controlled.  -Please take Norvasc daily and Coreg twice a day- I have sent new prescriptions for these into your pharmacy. -We have added a new medication called Lasix. Please take this twice a day for now, until you see your primary care doctor. -We have STOPPED your hydrochlorothiazide and Losartan for now, because your kidneys looked like they were a little damaged. Your primary care doctor may choose to restart these at your appointment. -We have discharged you on a pill for your diabetes. It is called Glipizide. Please take this once a day.

## 2016-11-02 NOTE — Progress Notes (Signed)
Orders received for pt discharge.  Discharge summary printed and reviewed with pt.  Explained medication regimen, and pt had no further questions at this time.  IV removed and site remains clean, dry, intact.  Telemetry removed.  Pt in stable condition and awaiting transport. 

## 2016-11-02 NOTE — H&P (View-Only) (Signed)
Patient Name: Pamela Campbell Date of Encounter: 11/01/2016  Primary Cardiologist: Dr. Sande Rives Problem List     Principal Problem:   Chest pain with high risk for cardiac etiology Active Problems:   Hypertensive heart disease   Chronic diastolic congestive heart failure (HCC)   Accelerated hypertension   Obesity-BMI 42   Sleep apnea   Dyslipidemia   Type 2 diabetes mellitus (HCC)   CKD (chronic kidney disease) stage 3, GFR 30-59 ml/min   Hypertensive emergency   Acute-on-chronic kidney injury ([place])   Acute kidney injury ([place])   Unstable angina pectoris ([place])   Essential hypertension   Subjective   Feels ok, denies chest pain, no SOB since the episode of Friday.   Inpatient Medications    Scheduled Meds: . amLODipine  10 mg Oral Daily  . aspirin EC  81 mg Oral Daily  . atorvastatin  80 mg Oral q1800  . carvedilol  12.5 mg Oral BID WC  . cephALEXin  500 mg Oral Q12H  . enoxaparin (LOVENOX) injection  40 mg Subcutaneous Q24H  . hydrALAZINE  50 mg Oral Q8H  . insulin aspart  0-9 Units Subcutaneous TID WC  . isosorbide mononitrate  30 mg Oral Daily   Continuous Infusions:  PRN Meds: acetaminophen, gi cocktail, hydrALAZINE, ondansetron (ZOFRAN) IV, technetium tetrofosmin   Vital Signs    Vitals:   10/31/16 1933 11/01/16 0535 11/01/16 0637 11/01/16 0823  BP: (!) 154/84 (!) 127/57 135/64 (!) 160/80  Pulse: 63 (!) 58 (!) 59 69  Resp: 18 18    Temp: 98.7 F (37.1 C) 98.1 F (36.7 C)    TempSrc: Oral Oral    SpO2: 98% 97%    Weight:  269 lb 11.2 oz (122.3 kg)    Height:        Intake/Output Summary (Last 24 hours) at 11/01/16 0949 Last data filed at 11/01/16 0830  Gross per 24 hour  Intake              320 ml  Output             1450 ml  Net            -1130 ml   Filed Weights   10/30/16 0407 10/31/16 0449 11/01/16 0535  Weight: 272 lb 7.8 oz (123.6 kg) 269 lb 6.4 oz (122.2 kg) 269 lb 11.2 oz (122.3 kg)    Physical Exam    GEN: Well  nourished, well developed, in no acute distress.  HEENT: Grossly normal.  Neck: Supple, no JVD, carotid bruits, or masses. Cardiac: RRR, no murmurs, rubs, or gallops. No clubbing, cyanosis, edema.  Radials/DP/PT 2+ and equal bilaterally.  Respiratory:  Respirations regular and unlabored, clear to auscultation bilaterally. GI: Soft, nontender, nondistended, BS + x 4. MS: no deformity or atrophy. Skin: warm and dry, no rash. Neuro:  Strength and sensation are intact. Psych: AAOx3.  Normal affect.  Labs    CBC No results for input(s): WBC, NEUTROABS, HGB, HCT, MCV, PLT in the last 72 hours. Basic Metabolic Panel  Recent Labs  10/30/16 1500 10/31/16 0509 11/01/16 0347  NA 137  --  136  K 3.8  --  4.2  CL 108  --  108  CO2 20*  --  20*  GLUCOSE 122*  --  120*  BUN 30*  --  31*  CREATININE 1.99* 2.07* 2.13*  CALCIUM 8.5*  --  8.4*   Liver Function Tests No results for input(s):  AST, ALT, ALKPHOS, BILITOT, PROT, ALBUMIN in the last 72 hours. Telemetry    NSR   ECG    NSR with inferior T wave inversion  Radiology    No results found.  Cardiac Studies   Lexiscan Myoview   There was no ST segment deviation noted during stress.  No T wave inversion was noted during stress.  Defect 1: There is a medium defect of mild severity present in the mid anterior and mid anteroseptal location.  The left ventricular ejection fraction is mildly decreased (45-54%).  Nuclear stress EF: 52%.   Intermediate risk, equivocal study. There is a reversible mid anterior wall defect that may represent shifting breast attenuation artifact, but ischemia in a branch of the LAD artery cannot be confidently excluded. Borderline reduced left ventricular systolic function. Abnormal ECG at baseline   Patient Profile     Morbidly obese AA female with a history of chronic diastolic CHF, HTN, HLD, Type 2 DM, and Stage 3 CKD who presented to [name] ED on 10/24/2016 for evaluation of chest pain.  She was hypertensive in the ED, reported non compliance with antihypertensive medications for the past 1+ years. Cyclic troponin values have been 0.07, 0.08, and 0.07.   Assessment & Plan    1. Chest pain - Resolved.  myoview showed an anterior defect that may be shifting breast artifact but may also be ischemia . Scheduled for a LHC on Friday 11/10, but developed acute SOB and needed to be diuresed, now improved, with Crea 2.1, lasix held yesterday, I will give 40 mg x 1 today. Plan for a cath in the am.   2 Hypertension- blood pressure elevated today. Increase Imdur to 60 mg po daily.    3 Acute on chronic stage III kidney disease- we'll hold on HCTZ and ARB for now. Would add later has renal function stabilizes. Cr stable.   4 severe hyperlipidemia-TSH WNL. Lipitor 80 mg daily added. Check lipids and liver in 4 weeks.  5 Type 2 IDDM-per primary  Signed, Ena Dawley, MD  11/01/2016 9:49 AM    [address],  [place], [place]  [address] Pager [phone] Phone: [phone]; Fax: [phone]

## 2016-11-02 NOTE — Progress Notes (Signed)
Inpatient Diabetes Program Recommendations  AACE/ADA: New Consensus Statement on Inpatient Glycemic Control (2015)  Target Ranges:  Prepandial:   less than 140 mg/dL      Peak postprandial:   less than 180 mg/dL (1-2 hours)      Critically ill patients:  140 - 180 mg/dL   Lab Results  Component Value Date   GLUCAP 133 (H) 11/02/2016   HGBA1C 10.2 (H) 10/24/2016    Review of Glycemic Control Results for Pamela Campbell, Pamela Campbell (MRN [id number]) as of 11/02/2016 12:14  Ref. Range 10/31/2016 06:21 10/31/2016 11:19 10/31/2016 16:39 10/31/2016 20:53 11/01/2016 05:34 11/01/2016 11:18 11/01/2016 16:17 11/01/2016 20:43 11/02/2016 06:04 11/02/2016 12:06  Glucose-Capillary Latest Ref Range: 65 - 99 mg/dL 109 (H) 142 (H) 124 (H) 176 (H) 118 (H) 135 (H) 144 (H) 173 (H) 131 (H) 133 (H)   Diabetes history: DM2 Outpatient Diabetes medications: No home meds. Had been on Lantus 22 units daily previously but discontinued Current orders for Inpatient glycemic control: Novolog correction 0-9 units tid  Inpatient Diabetes Program Recommendations:  Spoke with patient @ bedside about A1C results (10.2 = 246 average blood glucose over past 2-3 montsh) and explained what an A1C is, basic pathophysiology of DM Type 2, basic home care, basic diabetes diet nutrition principles, importance of checking CBGs and maintaining good CBG control to prevent long-term and short-term complications. Reviewed signs and symptoms of hyperglycemia and hypoglycemia and how to treat hypoglycemia at home. Also reviewed blood sugar goals at home.  RNs to provide ongoing basic DM education at bedside with this patient.  Patient states she lost 47 lb in the past and determined to try to lose weight again. Patient shared her previous job had been physically challenging with increased exercise but current job is mostly sitting. Patient plans to join [employer] to start exercising and decrease number of carbohydrates. Reviewed with patient need to  keep MD appts to review CBGs and according to American Diabetes Association, recommends insulin. Patient prefers to start oral meds, but EGFR 29 so Metformin not recommended. CBGs good @ present. Will follow. [facility]

## 2016-11-02 NOTE — Interval H&P Note (Signed)
Cath Lab Visit (complete for each Cath Lab visit)  Clinical Evaluation Leading to the Procedure:   ACS: No.  Non-ACS:    Anginal Classification: CCS III  Anti-ischemic medical therapy: Maximal Therapy (2 or more classes of medications)  Non-Invasive Test Results: Intermediate-risk stress test findings: cardiac mortality 1-3%/year  Prior CABG: No previous CABG      History and Physical Interval Note:  11/02/2016 8:53 AM  Pamela Campbell  has presented today for surgery, with the diagnosis of cp  The various methods of treatment have been discussed with the patient and family. After consideration of risks, benefits and other options for treatment, the patient has consented to  Procedure(s): Left Heart Cath and Coronary Angiography (N/A) as a surgical intervention .  The patient's history has been reviewed, patient examined, no change in status, stable for surgery.  I have reviewed the patient's chart and labs.  Questions were answered to the patient's satisfaction.     Sherren Mocha

## 2016-11-02 NOTE — Progress Notes (Signed)
Family Medicine Teaching Service Daily Progress Note Intern Pager: [phone]  Patient name: Pamela Campbell Medical record number: [id number] Date of birth: 04-Sep-1961 Age: 55 y.o. Gender: female  Primary Care Provider: No PCP Per Patient Consultants: Cardiology Code Status: FULL (confirmed on admission)  Pt Overview and Major Events to Date:  11/05: Admitted for ACS r/o 11/06: Nuclear stress performed, intermediate risk, would benefit from cath but has AoCKD  Assessment and Plan: Pamela Campbell is a 55 y.o. female presenting with chest tightness and dyspnea on exertion. PMH is significant for untreated HTN, HLD, DM2, CKD3, HFpEF and NSTEMI (03/2016).  #Chest Pain, Acute, Resolved:  Nuclear stress performed showing EF 52%, no ST changes, intermediate risk and would benefit from cath but has AoCKD. ECHO 11/06 showed EF 55-60%, mod LVH, no wall motion abn and G2DD. No chest pain this morning.  Cr 2.15 this AM.   --Cardiology following, appreciate recommendations. Plan for LHC this AM.  --Coreg 12.5 mg BID  --Amlodipine 10 mg daily  --Imdur increased to 60 mg daily per cards recs  --Atorvastatin 80 mg daily, check lipid panel in 4 weeks after d/c  --Zofran q6h PRN nausea  --Aspirin 81 mg QD --Telemetry  --Continuous pulse ox   #Acute on Chronic Kidney Disease, Stage 3, Uncontrolled:  Creatinine bumped again today from 2.07 > 2.13>2.15. Baseline Cr 1.25-1.34. Injury likely related to uncontrolled HTN. FENa 0.3% pre-renal. Renal U/S w/o hydronephrosis or bladder distention.  --Avoid nephrotoxic agents --Conservative control of BP to avoid hypoperfusion to kidneys --Daily BMPs  #Volume Overload in HFpEF: s/p IVFs at 23ml/hr, now off fluids. Unable to lie flat for cath. Not on diuretics at home. UOP 1.05L in last 24 hr. Net negative 3.8L since admission. Weight 267lb today, down from 272lb on 11/11 (262lb on admission). Able to lay flat today. --Received Lasix 40mg  x 1 on 11/10. Cards  giving another dose of Lasix 40mg  IV x 1. --Encouraged PO  #Presumed UTI: Urine micro shows many bacteria but UA only pertinent for >300 protein and 250 glucose. UCx with multiple species. Pt has been on abx, so will not recollect. --Keflex 500 mg BID x 7 days. Today is Day 7.  #Hypertensive Emergency, Acute, Resolved: BPs improved to 145/61 this morning. AoCKD and what looks to be demand ischemia.  Previously on ARB, BB, Thiazide, but not taken antihypertensives in 2 years.  --continue Coreg 12.5 mg BID --continue Amlodipine 10 mg QD --d/c Hydralazine 50 mg TID --d/c  Imdur 30mg  QD -Hold HCTZ for now --Daily BMPs  #Diabetes Mellitus Type 2, Chronic, Uncontrolled:  CBGs 109-135. A1c 10.2. Not taken medication in over 2 years. Previously on Lantus. --sSSI --CBG QAC and QHS --Consult CM for assistance with meds/ PCP --Consider GLP on d/c for PCP f/u  #Hyperlipidemia, Chronic, Uncontrolled:   Elevated total cholesterol 323, LDL 206, HDL70. ASCVD risk 27.0%. High intensity statin recommended. Risk factors include h/o possible NSTEMI, uncontrolled HTN, uncontrolled DM2, h/o smoking. +Paternal history of early MI (60). --Lipitor 80 mg QD  #Sleep Apnea, Chronic, Uncontrolled:  Per chart review. Patient denies she has this and notes that she does not use CPAP at home.    FEN/GI: Heart healthy/ Carb mod diet Prophylaxis: Lovenox SQ  Disposition: Home pending cath today  Subjective:  Pt continues to do well with no concerns this morning. Denies shortness of breath, denies chest pain.   Objective: Temp:  [98.1 F (36.7 C)-98.4 F (36.9 C)] 98.4 F (36.9 C) (11/13 1012) Pulse  Rate:  [0-80] 64 (11/13 1012) Resp:  [0-102] 0 (11/13 1000) BP: (125-171)/(61-93) 135/76 (11/13 1012) SpO2:  [94 %-99 %] 97 % (11/13 0950) Weight:  [267 lb 1.6 oz (121.2 kg)] 267 lb 1.6 oz (121.2 kg) (11/13 6389) Physical Exam: General: Laying flat in bed, in NAD, pleasant, talkative.  HEENT: NCAT,  MMM CV: RRR, no MRG Lungs: Normal work of breathing, very mild crackles in lung bases bilaterally Skin: warm, dry, no rashes or lesions, cap refill < 2 seconds Extremities: Pitting edema to the mid shin.  Laboratory:  Recent Labs Lab 10/28/16 0312 10/29/16 0300 11/02/16 0305  WBC 11.0* 10.9* 10.2  HGB 11.0* 11.1* 10.6*  HCT 34.4* 34.5* 32.6*  PLT 273 283 271    Recent Labs Lab 10/30/16 1500 10/31/16 0509 11/01/16 0347 11/02/16 0305  NA 137  --  136 136  K 3.8  --  4.2 3.5  CL 108  --  108 104  CO2 20*  --  20* 23  BUN 30*  --  31* 29*  CREATININE 1.99* 2.07* 2.13* 2.15*  CALCIUM 8.5*  --  8.4* 8.5*  GLUCOSE 122*  --  120* 140*    Cardiac Panel (last 3 results) No results for input(s): CKTOTAL, CKMB, TROPONINI, RELINDX in the last 72 hours. Lipid Panel     Component Value Date/Time   CHOL 323 (H) 10/24/2016 1822   TRIG 236 (H) 10/24/2016 1822   HDL 70 10/24/2016 1822   CHOLHDL 4.6 10/24/2016 1822   VLDL 47 (H) 10/24/2016 1822   LDLCALC 206 (H) 10/24/2016 1822    Imaging/Diagnostic Tests: DG Chest 2 View (10/24/16) FINDINGS: Cardiomegaly is stable from prior. Mediastinal silhouette within normal limits. Lungs normally inflated. Central vascular congestion without overt pulmonary edema. No pleural effusion. No focal infiltrates identified. No pneumothorax. No acute osseous abnormality.  IMPRESSION: 1. Stable cardiomegaly with central perihilar vascular congestion without overt pulmonary edema. 2. No other active cardiopulmonary disease.  US Renal (10/26/16) FINDINGS: Right Kidney: Length: 11.1 cm. Increased echogenicity consistent chronic medical renal disease. No mass or hydronephrosis visualized. Left Kidney: Length: 11.2 cm. Increased echogenicity consistent chronic medical renal disease. No mass or hydronephrosis visualized. Bladder: Appears normal for degree of bladder distention.  IMPRESSION: 1. Increased echogenicity both kidneys consistent  chronic medical renal disease. 2.  No acute abnormality.  No hydronephrosis bladder distention.  ECHO (10/26/16) Study Conclusions - Left ventricle: The cavity size was normal. Wall thickness was   increased in a pattern of moderate LVH. Systolic function was   normal. The estimated ejection fraction was in the range of 55%   to 60%. Wall motion was normal; there were no regional wall   motion abnormalities. Features are consistent with a pseudonormal   left ventricular filling pattern, with concomitant abnormal   relaxation and increased filling pressure (grade 2 diastolic   dysfunction). - Aortic valve: Trileaflet; mildly thickened, mildly calcified   leaflets. - Mitral valve: There was trivial regurgitation. - Left atrium: The atrium was mildly dilated.  NM Myocar Multi W/Spect W/Wall Motion / EF (10/26/16)  There was no ST segment deviation noted during stress.  No T wave inversion was noted during stress.  Defect 1: There is a medium defect of mild severity present in the mid anterior and mid anteroseptal location.  The left ventricular ejection fraction is mildly decreased (45-54%).  Nuclear stress EF: 52%.   Intermediate risk, equivocal study. There is a reversible mid anterior wall defect that may represent shifting breast  attenuation artifact, but ischemia in a branch of the LAD artery cannot be confidently excluded. (read as shifting  breast artifact per Dr. Acie Fredrickson) Borderline reduced left ventricular systolic function. Abnormal ECG at baseline.  Lovenia Kim, MD 11/02/2016, 2:37 PM PGY-1, [place] Intern pager: [phone], text pages welcome

## 2016-11-09 ENCOUNTER — Ambulatory Visit (INDEPENDENT_AMBULATORY_CARE_PROVIDER_SITE_OTHER): Payer: Self-pay | Admitting: Cardiology

## 2016-11-09 ENCOUNTER — Encounter: Payer: Self-pay | Admitting: Cardiology

## 2016-11-09 VITALS — BP 186/96 | HR 72 | Ht 64.0 in | Wt 263.2 lb

## 2016-11-09 DIAGNOSIS — E784 Other hyperlipidemia: Secondary | ICD-10-CM

## 2016-11-09 DIAGNOSIS — N183 Chronic kidney disease, stage 3 unspecified: Secondary | ICD-10-CM

## 2016-11-09 DIAGNOSIS — N179 Acute kidney failure, unspecified: Secondary | ICD-10-CM

## 2016-11-09 DIAGNOSIS — I10 Essential (primary) hypertension: Secondary | ICD-10-CM

## 2016-11-09 DIAGNOSIS — I2510 Atherosclerotic heart disease of native coronary artery without angina pectoris: Secondary | ICD-10-CM

## 2016-11-09 DIAGNOSIS — I110 Hypertensive heart disease with heart failure: Secondary | ICD-10-CM

## 2016-11-09 DIAGNOSIS — E6609 Other obesity due to excess calories: Secondary | ICD-10-CM

## 2016-11-09 DIAGNOSIS — E7849 Other hyperlipidemia: Secondary | ICD-10-CM

## 2016-11-09 DIAGNOSIS — I161 Hypertensive emergency: Secondary | ICD-10-CM

## 2016-11-09 LAB — BASIC METABOLIC PANEL
BUN: 35 mg/dL — ABNORMAL HIGH (ref 7–25)
CO2: 30 mmol/L (ref 20–31)
Calcium: 8.7 mg/dL (ref 8.6–10.4)
Chloride: 105 mmol/L (ref 98–110)
Creat: 1.93 mg/dL — ABNORMAL HIGH (ref 0.50–1.05)
Glucose, Bld: 195 mg/dL — ABNORMAL HIGH (ref 65–99)
Potassium: 4.2 mmol/L (ref 3.5–5.3)
Sodium: 140 mmol/L (ref 135–146)

## 2016-11-09 MED ORDER — FUROSEMIDE 40 MG PO TABS: 40.0000 mg | ORAL_TABLET | Freq: Every day | ORAL | 0 refills | Status: DC

## 2016-11-09 NOTE — Patient Instructions (Addendum)
Medication Instructions:  Your physician has recommended you make the following change in your medication:  1.  REDUCE the Lasix to 40 mg taking 1 tablet daily   Labwork: TODAY:   BMP  Testing/Procedures: None ordered  Follow-Up: Your physician recommends that you schedule a follow-up appointment in: [facility] FOR BLOOD PRESSURE RECHECK  Your physician recommends that you schedule a follow-up appointment in: [place] DR. HILTY    Any Other Special Instructions Will Be Listed Below (If Applicable).     If you need a refill on your cardiac medications before your next appointment, please call your pharmacy.

## 2016-11-09 NOTE — Assessment & Plan Note (Signed)
Admitted through the ED 10/25/16 with accelerated HTN and chest pain. B/P better today-(142/92 with large cuff by me)

## 2016-11-09 NOTE — Assessment & Plan Note (Signed)
It appears baseline SCr around 2

## 2016-11-09 NOTE — Assessment & Plan Note (Signed)
LDL was > 200, Lipitor added Nov 2017

## 2016-11-09 NOTE — Assessment & Plan Note (Addendum)
Pt's SCr was 2.5 on admission- 2.15 at discharge

## 2016-11-09 NOTE — Progress Notes (Signed)
11/09/2016 Pamela Campbell   [birth year]  [id number]  Primary Physician No PCP Per Patient Primary Cardiologist: Dr Debara Pickett  HPI:  Morbidly obese AA female with a history of chronic diastolic CHF, HTN, HLD, Type 2 DM, and Stage 3 CKD who presented to [facility] on 10/24/2016 for evaluation of chest pain. She was hypertensive in the ED and she was noted to have SCr of 2.5. She reported non compliance with antihypertensive medications for the past 1+ years. Cyclic troponin values were 0.07, 0.08, and 0.07. Echo showed normal LVF with LVH and grade 2 DD. Myoview was abnormal. She was hydrated for a few days till her SCr improved enough to proceed with diagnostic coronary angiogram, she was actually overhydrated and had to be diuresed just before her cath. Fortunately her cath showed only minor CAD. She is in the office today for a TOC f/u. Since discharge she has felt "loopy". No chest pain or SOB.    Current Outpatient Prescriptions  Medication Sig Dispense Refill  . acetaminophen (TYLENOL) 325 MG tablet Take 2 tablets (650 mg total) by mouth every 4 (four) hours as needed for headache or mild pain.    Marland Kitchen amLODipine (NORVASC) 10 MG tablet Take 1 tablet (10 mg total) by mouth daily. 30 tablet 2  . aspirin 81 MG chewable tablet Chew 81 mg by mouth daily.    Marland Kitchen atorvastatin (LIPITOR) 80 MG tablet Take 1 tablet (80 mg total) by mouth daily at 6 PM. 30 tablet 0  . Blood Glucose Monitoring Suppl (TRUE METRIX METER) W/DEVICE KIT Check blood sugar 4 times daily AC and HS 1 kit 0  . carvedilol (COREG) 25 MG tablet TAKE 1 TABLET BY MOUTH 2 TIMES DAILY WITH A MEAL. 60 tablet 2  . Collagen 500 MG CAPS Take 1,500 mg by mouth daily.    . Digestive Enzymes (ENZYME DIGEST) CAPS Take 1 capsule by mouth daily.    . furosemide (LASIX) 40 MG tablet Take 1 tablet (40 mg total) by mouth daily. 30 tablet 0  . glipiZIDE (GLUCOTROL) 5 MG tablet Take 1 tablet (5 mg total) by mouth daily before breakfast. 30 tablet 0  .  Multiple Vitamin (MULTIVITAMIN WITH MINERALS) TABS tablet Take 1 tablet by mouth daily.    . nitroGLYCERIN (NITROSTAT) 0.4 MG SL tablet Place 1 tablet (0.4 mg total) under the tongue every 5 (five) minutes x 3 doses as needed for chest pain. 25 tablet 2  . Omega-3 Fatty Acids (FISH OIL) 1000 MG CAPS Take 2,000 mg by mouth daily.     No current facility-administered medications for this visit.     Allergies  Allergen Reactions  . Vioxx [Rofecoxib] Other (See Comments)    Other reaction(s): Bleeding (intolerance) Bleeding  . Compazine [Prochlorperazine Edisylate] Swelling  . Prochlorperazine Swelling    Tongue swelling     Social History   Social History  . Marital status: Married    Spouse name: N/A  . Number of children: N/A  . Years of education: N/A   Occupational History  . Not on file.   Social History Main Topics  . Smoking status: Former Smoker    Quit date: 03/22/1995  . Smokeless tobacco: Never Used  . Alcohol use No  . Drug use: No  . Sexual activity: No   Other Topics Concern  . Not on file   Social History Narrative  . No narrative on file     Review of Systems: General: negative for  chills, fever, night sweats or weight changes.  Cardiovascular: negative for chest pain, dyspnea on exertion, edema, orthopnea, palpitations, paroxysmal nocturnal dyspnea or shortness of breath Dermatological: negative for rash Respiratory: negative for cough or wheezing Urologic: negative for hematuria Abdominal: negative for nausea, vomiting, diarrhea, bright red blood per rectum, melena, or hematemesis Neurologic: negative for visual changes, syncope, or dizziness Denies daytime fatigue, snoring, or poor sleep All other systems reviewed and are otherwise negative except as noted above.    Blood pressure (!) 186/96, pulse 72, height _0  (1.626 m), weight 263 lb 3.2 oz (119.4 kg), last menstrual period 02/02/2015.  General appearance: alert, cooperative, no distress  and morbidly obese Neck: no carotid bruit and no JVD Lungs: clear to auscultation bilaterally Heart: regular rate and rhythm Extremities: extremities normal, atraumatic, no cyanosis or edema Skin: Skin color, texture, turgor normal. No rashes or lesions Neurologic: Grossly normal   ASSESSMENT AND PLAN:   Hypertensive emergency Admitted through the ED 10/25/16 with accelerated HTN and chest pain. B/P better today-(142/92 with large cuff by me)  Hypertensive heart disease Normal LVF with LVH and grade 2 DD by echo Nov 2017  Acute kidney injury ([place]) Pt's SCr was 2.5 on admission- 2.15 at discharge   CKD (chronic kidney disease) stage 3, GFR 30-59 ml/min It appears baseline SCr around 2  Minor CAD  Minor CAD- 20% LAD, 30% CFX at cath 11/02/16  HLD (hyperlipidemia) LDL was > 200, Lipitor added Nov 2017  Obesity BMI 45 .  PLAN  Repeat B/P by me 142/92. Check BMP today. F/U B/P check in 1-2 weeks-consider challenging her with an ARB depending on her BMP. Discussed the importance of diet, walking for exercise, and compliance with medications. She says she will get established with [facility] for primary care. She may to get established with [facility] as well.  F/U Dr Debara Pickett 3 months.  Pamela Ransom PA-C 11/09/2016 2:34 PM

## 2016-11-09 NOTE — Assessment & Plan Note (Signed)
Minor CAD- 20% LAD, 30% CFX at cath 11/02/16

## 2016-11-09 NOTE — Assessment & Plan Note (Signed)
Normal LVF with LVH and grade 2 DD by echo Nov 2017

## 2016-11-17 ENCOUNTER — Ambulatory Visit (INDEPENDENT_AMBULATORY_CARE_PROVIDER_SITE_OTHER): Payer: Self-pay | Admitting: Pharmacist Clinician (PhC)/ Clinical Pharmacy Specialist

## 2016-11-17 DIAGNOSIS — I10 Essential (primary) hypertension: Secondary | ICD-10-CM

## 2016-11-17 NOTE — Patient Instructions (Signed)
Return for a follow up appointment in 3 months  Your blood pressure today is 142/92  (goal is <130/80)  Check your blood pressure at home several days each week and keep record of the readings.  Take your BP meds as follows:  Continue with your current medications  Start working with your yoga video, preferably 3-4 days per week Use your recumbent bike for 5 minutes per day. After 1-2 weeks increase in 5 minute intervals each week  Watch your diet, decrease your portions and increase your vegetable intake.  Bring all of your meds, your BP cuff and your record of home blood pressures to your next appointment.  Exercise as you're able, try to walk approximately 30 minutes per day.  Keep salt intake to a minimum, especially watch canned and prepared boxed foods.  Eat more fresh fruits and vegetables and fewer canned items.  Avoid eating in fast food restaurants.    HOW TO TAKE YOUR BLOOD PRESSURE: . Rest 5 minutes before taking your blood pressure. .  Don't smoke or drink caffeinated beverages for at least 30 minutes before. . Take your blood pressure before (not after) you eat. . Sit comfortably with your back supported and both feet on the floor (don't cross your legs). . Elevate your arm to heart level on a table or a desk. . Use the proper sized cuff. It should fit smoothly and snugly around your bare upper arm. There should be enough room to slip a fingertip under the cuff. The bottom edge of the cuff should be 1 inch above the crease of the elbow. . Ideally, take 3 measurements at one sitting and record the average.

## 2016-11-18 ENCOUNTER — Encounter: Payer: Self-pay | Admitting: Pharmacist Clinician (PhC)/ Clinical Pharmacy Specialist

## 2016-11-18 NOTE — Assessment & Plan Note (Signed)
We had a long discussion about the need to keep BP at goal.  We discussed the need to eat smaller portions, avoid eating out when she can and becoming more active.  She will start her yoga DVD, but I explained that she also needs some cardiovascular exercise.  She states she has an exercise bike, but she always gets short of breath when using.   I encouraged her to start on it daily, just 5 minutes and increase every 1-2 weeks by another 5 minutes.  She would like to work on these lifestyle modifications, so will leave her medications unchanged and will follow up with her after January 1.

## 2016-11-18 NOTE — Progress Notes (Signed)
11/18/2016 Amelie L Ackroyd 08/05/1961 [id number]   HPI:  Pamela Campbell is a 55 y.o. female patient of Dr Debara Pickett, with a PMH below who presents today for hypertension clinic evaluation.  She saw Kerin Ransom PA on Nov 20 with a BP of 186/96.  She was hospitalized from Nov 4-13 because of chest pain with hypertensive heart disease.  BP on arrival was 225/115.  At that time she admitted to non- compliance with BP meds for greater than 1 year.   No changes were made to her antihypertensive medications last week and she presents today for follow up.    Blood Pressure Goal:  130/80    Current Medications:  Amlodipine 10 mg   Carvedilol 25 mg bid  Cardiac Hx:  Hypertensive emergency, LVH with grade 2 DD, morbid obesity  Family Hx:  Father died from MI at 42, son, now 31 with CHF  Social Hx:  No tobacco or alcohol, only rarely drinks caffeine  Diet:  Eats out for about 1/2 of her meals; eating out usually [employer] or [employer].  States she eats mostly steamed vegetables, no rice; does not add salt to her foods  Exercise:  Unable to do much, works 4pm to 2 am, then goes to bed around 5 am and gets up around 2-3 pm for work.  Has been trying to find 24 hr gym with a pool.  States she just ordered a yoga DVD and is hoping to do that at home regularly  Home BP readings:  None  Intolerances:   No cardiac med intolerances  Wt Readings from Last 3 Encounters:  11/09/16 263 lb 3.2 oz (119.4 kg)  11/02/16 267 lb 1.6 oz (121.2 kg)  08/29/15 227 lb 12.8 oz (103.3 kg)   BP Readings from Last 3 Encounters:  11/18/16 (!) 142/92  11/09/16 (!) 186/96  11/02/16 140/69   Pulse Readings from Last 3 Encounters:  11/18/16 72  11/09/16 72  11/02/16 72    Current Outpatient Prescriptions  Medication Sig Dispense Refill  . acetaminophen (TYLENOL) 325 MG tablet Take 2 tablets (650 mg total) by mouth every 4 (four) hours as needed for headache or mild pain.    Marland Kitchen amLODipine (NORVASC) 10 MG  tablet Take 1 tablet (10 mg total) by mouth daily. 30 tablet 2  . aspirin 81 MG chewable tablet Chew 81 mg by mouth daily.    Marland Kitchen atorvastatin (LIPITOR) 80 MG tablet Take 1 tablet (80 mg total) by mouth daily at 6 PM. 30 tablet 0  . Blood Glucose Monitoring Suppl (TRUE METRIX METER) W/DEVICE KIT Check blood sugar 4 times daily AC and HS 1 kit 0  . carvedilol (COREG) 25 MG tablet TAKE 1 TABLET BY MOUTH 2 TIMES DAILY WITH A MEAL. 60 tablet 2  . Collagen 500 MG CAPS Take 1,500 mg by mouth daily.    . Digestive Enzymes (ENZYME DIGEST) CAPS Take 1 capsule by mouth daily.    . furosemide (LASIX) 40 MG tablet Take 1 tablet (40 mg total) by mouth daily. 30 tablet 0  . glipiZIDE (GLUCOTROL) 5 MG tablet Take 1 tablet (5 mg total) by mouth daily before breakfast. 30 tablet 0  . Multiple Vitamin (MULTIVITAMIN WITH MINERALS) TABS tablet Take 1 tablet by mouth daily.    . nitroGLYCERIN (NITROSTAT) 0.4 MG SL tablet Place 1 tablet (0.4 mg total) under the tongue every 5 (five) minutes x 3 doses as needed for chest pain. 25 tablet 2  .  Omega-3 Fatty Acids (FISH OIL) 1000 MG CAPS Take 2,000 mg by mouth daily.     No current facility-administered medications for this visit.     Allergies  Allergen Reactions  . Vioxx [Rofecoxib] Other (See Comments)    Other reaction(s): Bleeding (intolerance) Bleeding  . Compazine [Prochlorperazine Edisylate] Swelling  . Prochlorperazine Swelling    Tongue swelling     Past Medical History:  Diagnosis Date  . Chronic diastolic CHF (congestive heart failure) ([place])    a. 03/2015: echo w/ EF of 50-55%, no WMA, Grade 2 DD, trivial AI, mild MR.  . Diabetes mellitus without complication ([place])   . Hypertension     Blood pressure (!) 142/92, pulse 72, last menstrual period 02/02/2015.  Accelerated hypertension We had a long discussion about the need to keep BP at goal.  We discussed the need to eat smaller portions, avoid eating out when she can and becoming more active.   She will start her yoga DVD, but I explained that she also needs some cardiovascular exercise.  She states she has an exercise bike, but she always gets short of breath when using.   I encouraged her to start on it daily, just 5 minutes and increase every 1-2 weeks by another 5 minutes.  She would like to work on these lifestyle modifications, so will leave her medications unchanged and will follow up with her after January 1.     Tommy Medal PharmD CPP [facility]

## 2016-12-02 MED FILL — ?CARVEDILOL 25 MG TABLET: 25 | 30 days supply | Qty: 60 | Fill #1

## 2016-12-02 MED FILL — AMLODIPINE BESYLATE 10 MG T: 10 | 30 days supply | Qty: 30 | Fill #1

## 2016-12-03 ENCOUNTER — Ambulatory Visit: Payer: Self-pay | Attending: Family Medicine | Admitting: Family Medicine

## 2016-12-03 ENCOUNTER — Encounter: Payer: Self-pay | Admitting: Family Medicine

## 2016-12-03 VITALS — BP 173/90 | HR 76 | Temp 98.3°F | Ht 64.0 in | Wt 263.4 lb

## 2016-12-03 DIAGNOSIS — B1920 Unspecified viral hepatitis C without hepatic coma: Secondary | ICD-10-CM | POA: Insufficient documentation

## 2016-12-03 DIAGNOSIS — N183 Chronic kidney disease, stage 3 unspecified: Secondary | ICD-10-CM

## 2016-12-03 DIAGNOSIS — I10 Essential (primary) hypertension: Secondary | ICD-10-CM

## 2016-12-03 DIAGNOSIS — I2510 Atherosclerotic heart disease of native coronary artery without angina pectoris: Secondary | ICD-10-CM | POA: Insufficient documentation

## 2016-12-03 DIAGNOSIS — I130 Hypertensive heart and chronic kidney disease with heart failure and stage 1 through stage 4 chronic kidney disease, or unspecified chronic kidney disease: Secondary | ICD-10-CM | POA: Insufficient documentation

## 2016-12-03 DIAGNOSIS — E6609 Other obesity due to excess calories: Secondary | ICD-10-CM

## 2016-12-03 DIAGNOSIS — E1100 Type 2 diabetes mellitus with hyperosmolarity without nonketotic hyperglycemic-hyperosmolar coma (NKHHC): Secondary | ICD-10-CM | POA: Insufficient documentation

## 2016-12-03 DIAGNOSIS — Z1159 Encounter for screening for other viral diseases: Secondary | ICD-10-CM

## 2016-12-03 DIAGNOSIS — E785 Hyperlipidemia, unspecified: Secondary | ICD-10-CM | POA: Insufficient documentation

## 2016-12-03 DIAGNOSIS — I5032 Chronic diastolic (congestive) heart failure: Secondary | ICD-10-CM | POA: Insufficient documentation

## 2016-12-03 DIAGNOSIS — E1122 Type 2 diabetes mellitus with diabetic chronic kidney disease: Secondary | ICD-10-CM | POA: Insufficient documentation

## 2016-12-03 DIAGNOSIS — Z7982 Long term (current) use of aspirin: Secondary | ICD-10-CM | POA: Insufficient documentation

## 2016-12-03 LAB — COMPLETE METABOLIC PANEL WITH GFR
ALT: 18 U/L (ref 6–29)
AST: 18 U/L (ref 10–35)
Albumin: 3.4 g/dL — ABNORMAL LOW (ref 3.6–5.1)
Alkaline Phosphatase: 130 U/L (ref 33–130)
BUN: 36 mg/dL — ABNORMAL HIGH (ref 7–25)
CHLORIDE: 103 mmol/L (ref 98–110)
CO2: 27 mmol/L (ref 20–31)
CREATININE: 2.18 mg/dL — AB (ref 0.50–1.05)
Calcium: 9.3 mg/dL (ref 8.6–10.4)
GFR, EST AFRICAN AMERICAN: 29 mL/min — AB (ref >=60)
GFR, Est Non African American: 25 mL/min — ABNORMAL LOW (ref >=60)
Glucose, Bld: 201 mg/dL — ABNORMAL HIGH (ref 65–99)
Potassium: 4.4 mmol/L (ref 3.5–5.3)
Sodium: 139 mmol/L (ref 135–146)
Total Bilirubin: 0.2 mg/dL (ref 0.2–1.2)
Total Protein: 6.7 g/dL (ref 6.1–8.1)

## 2016-12-03 LAB — GLUCOSE, POCT (MANUAL RESULT ENTRY): POC GLUCOSE: 195 mg/dL — AB (ref 70–99)

## 2016-12-03 MED ORDER — GLIPIZIDE 5 MG PO TABS: 5.0000 mg | ORAL_TABLET | Freq: Every day | ORAL | 5 refills | Status: DC

## 2016-12-03 MED ORDER — CARVEDILOL 25 MG PO TABS: ORAL_TABLET | ORAL | 5 refills | Status: DC

## 2016-12-03 MED ORDER — FUROSEMIDE 40 MG PO TABS: 40.0000 mg | ORAL_TABLET | Freq: Every day | ORAL | 5 refills | Status: DC

## 2016-12-03 MED FILL — glipiZIDE 5 MG TABS: 5 | 30 days supply | Qty: 30 | Fill #0

## 2016-12-03 MED FILL — FUROSEMIDE 40 MG TABLET: 40 | 30 days supply | Qty: 30 | Fill #0

## 2016-12-03 NOTE — Progress Notes (Signed)
Med refills-glipizide and lasix

## 2016-12-03 NOTE — Progress Notes (Signed)
Subjective:  Patient ID: Pamela Campbell, female    DOB: 27-Mar-1961  Age: 55 y.o. MRN: [id number]  CC: Diabetes; Hypertension; and Hospitalization Follow-up   HPI Adaeze Better Barsamian is a 55 year old female with a history of hypertension, type 2 diabetes mellitus (A1c 10.2 from 10/2016), morbid obesity, diastolic CHF (EF 10-93%), stage 3 CKD who presents today for follow-up visit.  She had a hospitalization at [facility] last month for chest pain. Nuclear stress test was an intermediate risk study with medium defect of mild severity in the mid anterior and mid anteroseptal location LHC revealed mild nonobstructive CAD, elevated LVEDP. 2-D echo revealed EF of 55-60% with grade 2 diastolic dysfunction. Lipitor and Lasix for commenced during that hospitalization.  Today she informs me she does not take Lipitor and would like to control her cholesterol on her own despite my discussing with her that she has an increased ASCVD risk. She has not been exercising neither has she been compliant with a diabetic diet.  She denies chest pains, shortness of breath and is requesting refills at this time.  Past Medical History:  Diagnosis Date  . Chronic diastolic CHF (congestive heart failure) ([place])    a. 03/2015: echo w/ EF of 50-55%, no WMA, Grade 2 DD, trivial AI, mild MR.  . Diabetes mellitus without complication ([place])   . Hypertension     Past Surgical History:  Procedure Laterality Date  . CARDIAC CATHETERIZATION N/A 11/02/2016   Procedure: Left Heart Cath and Coronary Angiography;  Surgeon: Sherren Mocha, MD;  Location: [place] [facility];  Service: Cardiovascular;  Laterality: N/A;  . CESAREAN SECTION    . FRACTURE SURGERY    . TUBAL LIGATION      Allergies  Allergen Reactions  . Vioxx [Rofecoxib] Other (See Comments)    Other reaction(s): Bleeding (intolerance) Bleeding  . Compazine [Prochlorperazine Edisylate] Swelling  . Prochlorperazine Swelling    Tongue swelling       Outpatient Medications Prior to Visit  Medication Sig Dispense Refill  . amLODipine (NORVASC) 10 MG tablet Take 1 tablet (10 mg total) by mouth daily. 30 tablet 2  . aspirin 81 MG chewable tablet Chew 81 mg by mouth daily.    . Blood Glucose Monitoring Suppl (TRUE METRIX METER) W/DEVICE KIT Check blood sugar 4 times daily AC and HS 1 kit 0  . carvedilol (COREG) 25 MG tablet TAKE 1 TABLET BY MOUTH 2 TIMES DAILY WITH A MEAL. 60 tablet 2  . furosemide (LASIX) 40 MG tablet Take 1 tablet (40 mg total) by mouth daily. 30 tablet 0  . glipiZIDE (GLUCOTROL) 5 MG tablet Take 1 tablet (5 mg total) by mouth daily before breakfast. 30 tablet 0  . atorvastatin (LIPITOR) 80 MG tablet Take 1 tablet (80 mg total) by mouth daily at 6 PM. (Patient not taking: Reported on 12/03/2016) 30 tablet 0  . Collagen 500 MG CAPS Take 1,500 mg by mouth daily.    . Digestive Enzymes (ENZYME DIGEST) CAPS Take 1 capsule by mouth daily.    . Multiple Vitamin (MULTIVITAMIN WITH MINERALS) TABS tablet Take 1 tablet by mouth daily.    . nitroGLYCERIN (NITROSTAT) 0.4 MG SL tablet Place 1 tablet (0.4 mg total) under the tongue every 5 (five) minutes x 3 doses as needed for chest pain. (Patient not taking: Reported on 12/03/2016) 25 tablet 2  . Omega-3 Fatty Acids (FISH OIL) 1000 MG CAPS Take 2,000 mg by mouth daily.    Marland Kitchen  acetaminophen (TYLENOL) 325 MG tablet Take 2 tablets (650 mg total) by mouth every 4 (four) hours as needed for headache or mild pain.     No facility-administered medications prior to visit.     ROS Review of Systems  Constitutional: Negative for activity change, appetite change and fatigue.  HENT: Negative for congestion, sinus pressure and sore throat.   Eyes: Negative for visual disturbance.  Respiratory: Negative for cough, chest tightness, shortness of breath and wheezing.   Cardiovascular: Negative for chest pain and palpitations.  Gastrointestinal: Negative for abdominal distention, abdominal pain  and constipation.  Endocrine: Negative for polydipsia.  Genitourinary: Negative for dysuria and frequency.  Musculoskeletal: Negative for arthralgias and back pain.  Skin: Negative for rash.  Neurological: Negative for tremors, light-headedness and numbness.  Hematological: Does not bruise/bleed easily.  Psychiatric/Behavioral: Negative for agitation and behavioral problems.    Objective:  BP (!) 173/90 (BP Location: Right Arm, Patient Position: Sitting, Cuff Size: Large)   Pulse 76   Temp 98.3 F (36.8 C) (Oral)   Ht _0  (1.626 m)   Wt 263 lb 6.4 oz (119.5 kg)   LMP 02/02/2015   SpO2 95%   BMI 45.21 kg/m   BP/Weight 12/03/2016 11/17/2016 78/67/6720  Systolic BP 947 096 283  Diastolic BP 90 92 96  Wt. (Lbs) 263.4 - 263.2  BMI 45.21 - 45.18      Physical Exam  Constitutional: She is oriented to person, place, and time. She appears well-developed and well-nourished.  Obese  Cardiovascular: Normal rate, normal heart sounds and intact distal pulses.   No murmur heard. Pulmonary/Chest: Effort normal and breath sounds normal. She has no wheezes. She has no rales. She exhibits no tenderness.  Abdominal: Soft. Bowel sounds are normal. She exhibits no distension and no mass. There is no tenderness.  Musculoskeletal: Normal range of motion.  Neurological: She is alert and oriented to person, place, and time.  Skin: Skin is warm and dry.  Psychiatric: She has a normal mood and affect.     CMP Latest Ref Rng & Units 11/09/2016 11/02/2016 11/01/2016  Glucose 65 - 99 mg/dL 195(H) 140(H) 120(H)  BUN 7 - 25 mg/dL 35(H) 29(H) 31(H)  Creatinine 0.50 - 1.05 mg/dL 1.93(H) 2.15(H) 2.13(H)  Sodium 135 - 146 mmol/L 140 136 136  Potassium 3.5 - 5.3 mmol/L 4.2 3.5 4.2  Chloride 98 - 110 mmol/L 105 104 108  CO2 20 - 31 mmol/L 30 23 20(L)  Calcium 8.6 - 10.4 mg/dL 8.7 8.5(L) 8.4(L)  Total Protein 6.0 - 8.3 g/dL - - -  Total Bilirubin 0.2 - 1.2 mg/dL - - -  Alkaline Phos 39 - 117 U/L -  - -  AST 0 - 37 U/L - - -  ALT 0 - 35 U/L - - -    Lipid Panel     Component Value Date/Time   CHOL 323 (H) 10/24/2016 1822   TRIG 236 (H) 10/24/2016 1822   HDL 70 10/24/2016 1822   CHOLHDL 4.6 10/24/2016 1822   VLDL 47 (H) 10/24/2016 1822   LDLCALC 206 (H) 10/24/2016 1822    Lab Results  Component Value Date   HGBA1C 10.2 (H) 10/24/2016    Assessment & Plan:   1. Type 2 diabetes mellitus with hyperosmolarity without coma, without long-term current use of insulin (HCC) Uncontrolled with last A1c of 10.2 We'll make no regimen changes today as she had been off all her medications prior to A1c done last month Emphasized diabetic  diet and lifestyle modifications. Keep blood sugar logs with fasting goals of 80-120 mg/dl, random of less than 180 and in the event of sugars less than 60 mg/dl or greater than 400 mg/dl please notify the clinic ASAP. It is recommended that you undergo annual eye exams and annual foot exams. Pneumovax is recommended every 5 years before the age of 68 and once for a lifetime at or after the age of 32. - Glucose (CBG) - COMPLETE METABOLIC PANEL WITH GFR - Microalbumin / creatinine urine ratio  2. Need for hepatitis C screening test - Hepatitis C antibody, reflex  3. Chronic diastolic congestive heart failure (HCC) EF of 55-60% from a 2-D echo 10/2016 Euvolemic at this time  4. CKD (chronic kidney disease) stage 3, GFR 30-59 ml/min Last creatinine was 1.9 We'll repeat renal function today.  Avoid nephrotoxic agents  5. Essential hypertension Uncontrolled due to not taking medications today Emphasized compliance with medications We'll reassess blood pressure at next visit  6. Obesity due to excess calories with serious comorbidity, unspecified classification Discussed reducing portion sizes, exercising  7. Hyperlipidemia Refusing to take statin despite his cussing increased ASCVD risk Continue low-cholesterol diet and lifestyle  modification  8. HCM Declines mammogram, colonoscopy despite my discussing the risks with her.  Meds ordered this encounter  Medications  . glipiZIDE (GLUCOTROL) 5 MG tablet    Sig: Take 1 tablet (5 mg total) by mouth daily before breakfast.    Dispense:  30 tablet    Refill:  5  . furosemide (LASIX) 40 MG tablet    Sig: Take 1 tablet (40 mg total) by mouth daily.    Dispense:  30 tablet    Refill:  5  . carvedilol (COREG) 25 MG tablet    Sig: TAKE 1 TABLET BY MOUTH 2 TIMES DAILY WITH A MEAL.    Dispense:  60 tablet    Refill:  5    Follow-up: Return in about 1 month (around 01/03/2017) for follow .   Arnoldo Morale MD

## 2016-12-03 NOTE — Patient Instructions (Signed)
Diabetes Mellitus and Food It is important for you to manage your blood sugar (glucose) level. Your blood glucose level can be greatly affected by what you eat. Eating healthier foods in the appropriate amounts throughout the day at about the same time each day will help you control your blood glucose level. It can also help slow or prevent worsening of your diabetes mellitus. Healthy eating may even help you improve the level of your blood pressure and reach or maintain a healthy weight. General recommendations for healthful eating and cooking habits include:  Eating meals and snacks regularly. Avoid going long periods of time without eating to lose weight.  Eating a diet that consists mainly of plant-based foods, such as fruits, vegetables, nuts, legumes, and whole grains.  Using low-heat cooking methods, such as baking, instead of high-heat cooking methods, such as deep frying.  Work with your dietitian to make sure you understand how to use the Nutrition Facts information on food labels. How can food affect me? Carbohydrates Carbohydrates affect your blood glucose level more than any other type of food. Your dietitian will help you determine how many carbohydrates to eat at each meal and teach you how to count carbohydrates. Counting carbohydrates is important to keep your blood glucose at a healthy level, especially if you are using insulin or taking certain medicines for diabetes mellitus. Alcohol Alcohol can cause sudden decreases in blood glucose (hypoglycemia), especially if you use insulin or take certain medicines for diabetes mellitus. Hypoglycemia can be a life-threatening condition. Symptoms of hypoglycemia (sleepiness, dizziness, and disorientation) are similar to symptoms of having too much alcohol. If your health care provider has given you approval to drink alcohol, do so in moderation and use the following guidelines:  Women should not have more than one drink per day, and men  should not have more than two drinks per day. One drink is equal to: ? 12 oz of beer. ? 5 oz of wine. ? 1 oz of hard liquor.  Do not drink on an empty stomach.  Keep yourself hydrated. Have water, diet soda, or unsweetened iced tea.  Regular soda, juice, and other mixers might contain a lot of carbohydrates and should be counted.  What foods are not recommended? As you make food choices, it is important to remember that all foods are not the same. Some foods have fewer nutrients per serving than other foods, even though they might have the same number of calories or carbohydrates. It is difficult to get your body what it needs when you eat foods with fewer nutrients. Examples of foods that you should avoid that are high in calories and carbohydrates but low in nutrients include:  Trans fats (most processed foods list trans fats on the Nutrition Facts label).  Regular soda.  Juice.  Candy.  Sweets, such as cake, pie, doughnuts, and cookies.  Fried foods.  What foods can I eat? Eat nutrient-rich foods, which will nourish your body and keep you healthy. The food you should eat also will depend on several factors, including:  The calories you need.  The medicines you take.  Your weight.  Your blood glucose level.  Your blood pressure level.  Your cholesterol level.  You should eat a variety of foods, including:  Protein. ? Lean cuts of meat. ? Proteins low in saturated fats, such as fish, egg whites, and beans. Avoid processed meats.  Fruits and vegetables. ? Fruits and vegetables that may help control blood glucose levels, such as apples,   mangoes, and yams.  Dairy products. ? Choose fat-free or low-fat dairy products, such as milk, yogurt, and cheese.  Grains, bread, pasta, and rice. ? Choose whole grain products, such as multigrain bread, whole oats, and brown rice. These foods may help control blood pressure.  Fats. ? Foods containing healthful fats, such as  nuts, avocado, olive oil, canola oil, and fish.  Does everyone with diabetes mellitus have the same meal plan? Because every person with diabetes mellitus is different, there is not one meal plan that works for everyone. It is very important that you meet with a dietitian who will help you create a meal plan that is just right for you. This information is not intended to replace advice given to you by your health care provider. Make sure you discuss any questions you have with your health care provider. Document Released: 09/03/2005 Document Revised: 05/14/2016 Document Reviewed: 11/03/2013 Elsevier Interactive Patient Education  2017 [employer] Inc.  

## 2016-12-04 ENCOUNTER — Other Ambulatory Visit: Payer: Self-pay | Admitting: Family Medicine

## 2016-12-04 LAB — MICROALBUMIN / CREATININE URINE RATIO
Creatinine, Urine: 28 mg/dL (ref 20–320)
MICROALB UR: 79.9 mg/dL
MICROALB/CREAT RATIO: 2854 ug/mg{creat} — AB (ref <30)

## 2016-12-04 LAB — HEPATITIS C ANTIBODY: HCV Ab: REACTIVE — AB

## 2016-12-04 MED ORDER — LISINOPRIL 5 MG PO TABS: 5.0000 mg | ORAL_TABLET | Freq: Every day | ORAL | 3 refills | Status: DC

## 2016-12-08 ENCOUNTER — Encounter: Payer: Self-pay | Admitting: Family Medicine

## 2016-12-08 DIAGNOSIS — R768 Other specified abnormal immunological findings in serum: Secondary | ICD-10-CM | POA: Insufficient documentation

## 2016-12-08 LAB — HEPATITIS C RNA QUANTITATIVE: HCV QUANT: NOT DETECTED [IU]/mL (ref <15)

## 2016-12-30 MED FILL — FUROSEMIDE 40 MG TABLET: 40 | 30 days supply | Qty: 30 | Fill #1

## 2016-12-30 MED FILL — glipiZIDE 5 MG TABS: 5 | 30 days supply | Qty: 30 | Fill #1

## 2016-12-30 MED FILL — AMLODIPINE BESYLATE 10 MG T: 10 | 30 days supply | Qty: 30 | Fill #2

## 2017-02-08 ENCOUNTER — Other Ambulatory Visit: Payer: Self-pay | Admitting: Internal Medicine

## 2017-02-08 DIAGNOSIS — I10 Essential (primary) hypertension: Secondary | ICD-10-CM

## 2017-02-08 MED FILL — AMLODIPINE BESYLATE 10 MG T: 10 | 30 days supply | Qty: 30 | Fill #0

## 2017-02-08 MED FILL — glipiZIDE 5 MG TABS: 5 | 30 days supply | Qty: 30 | Fill #2

## 2017-02-08 MED FILL — CARVEDILOL 25 MG TABLET: 25 | 30 days supply | Qty: 60 | Fill #2

## 2017-02-08 MED FILL — ?FUROSEMIDE 40 MG TABLET: 40 | 30 days supply | Qty: 30 | Fill #2

## 2017-02-09 ENCOUNTER — Ambulatory Visit: Payer: Self-pay | Admitting: Internal Medicine

## 2017-03-08 MED FILL — ?AMLODIPINE BESYLATE 10 MG: 10 | 30 days supply | Qty: 30 | Fill #1

## 2017-03-08 MED FILL — glipiZIDE 5 MG TABS: 5 | 30 days supply | Qty: 30 | Fill #3

## 2017-03-08 MED FILL — ?FUROSEMIDE 40 MG TABLET: 40 | 30 days supply | Qty: 30 | Fill #3

## 2017-04-07 MED FILL — ?GLIPIZIDE 5MG TABLET: 5 | 35 days supply | Qty: 35 | Fill #4

## 2017-04-07 MED FILL — CARVEDILOL 25 MG TABLET: 25 | 30 days supply | Qty: 60 | Fill #0

## 2017-04-07 MED FILL — ?AMLODIPINE BESYLATE 10 MG: 10 | 30 days supply | Qty: 30 | Fill #2

## 2017-04-07 MED FILL — ?FUROSEMIDE 40 MG TABLET: 40 | 30 days supply | Qty: 30 | Fill #4

## 2017-05-18 ENCOUNTER — Other Ambulatory Visit: Payer: Self-pay | Admitting: Family Medicine

## 2017-05-18 DIAGNOSIS — I10 Essential (primary) hypertension: Secondary | ICD-10-CM

## 2017-05-18 MED FILL — ?GLIPIZIDE 5MG TABLET: 5 | 25 days supply | Qty: 25 | Fill #5

## 2017-05-18 MED FILL — ?FUROSEMIDE 40 MG TABLET: 40 | 30 days supply | Qty: 30 | Fill #5

## 2017-05-18 MED FILL — CARVEDILOL 25 MG TABLET: 25 | 30 days supply | Qty: 60 | Fill #1

## 2017-05-21 ENCOUNTER — Other Ambulatory Visit: Payer: Self-pay | Admitting: Family Medicine

## 2017-05-21 DIAGNOSIS — I10 Essential (primary) hypertension: Secondary | ICD-10-CM

## 2017-05-25 MED FILL — AMLODIPINE BESYLATE 10 MG T: 10 | 30 days supply | Qty: 30 | Fill #0

## 2017-07-30 ENCOUNTER — Telehealth: Payer: Self-pay | Admitting: Internal Medicine

## 2017-07-30 NOTE — Telephone Encounter (Signed)
[place] with me .. I have not seen her since 2016. Whatever is best for her.  Dr. Lemmie Evens

## 2017-07-30 NOTE — Telephone Encounter (Signed)
New message    Pt is requesting to be switched from [facility] to Dr Judithann Sauger

## 2017-08-01 NOTE — Telephone Encounter (Signed)
OK with me.

## 2017-08-02 NOTE — Telephone Encounter (Signed)
Appt scheduled with Almyra Deforest, PA on 8/28

## 2017-08-06 ENCOUNTER — Telehealth: Payer: Self-pay | Admitting: Physician Assistant

## 2017-08-06 NOTE — Telephone Encounter (Signed)
Received records from [facility] for appointment on 08/18/17 with Almyra Deforest, PA.  Records put with Hao's schedule for 08/18/17. lp

## 2017-08-18 ENCOUNTER — Ambulatory Visit (INDEPENDENT_AMBULATORY_CARE_PROVIDER_SITE_OTHER): Payer: Managed Care, Other (non HMO) | Admitting: Physician Assistant

## 2017-08-18 VITALS — BP 197/101 | HR 72 | Ht 64.0 in | Wt 276.0 lb

## 2017-08-18 DIAGNOSIS — I5032 Chronic diastolic (congestive) heart failure: Secondary | ICD-10-CM

## 2017-08-18 DIAGNOSIS — N189 Chronic kidney disease, unspecified: Secondary | ICD-10-CM | POA: Diagnosis not present

## 2017-08-18 DIAGNOSIS — Z79899 Other long term (current) drug therapy: Secondary | ICD-10-CM | POA: Diagnosis not present

## 2017-08-18 DIAGNOSIS — Z9119 Patient's noncompliance with other medical treatment and regimen: Secondary | ICD-10-CM

## 2017-08-18 DIAGNOSIS — I10 Essential (primary) hypertension: Secondary | ICD-10-CM | POA: Diagnosis not present

## 2017-08-18 DIAGNOSIS — E119 Type 2 diabetes mellitus without complications: Secondary | ICD-10-CM

## 2017-08-18 DIAGNOSIS — N184 Chronic kidney disease, stage 4 (severe): Secondary | ICD-10-CM | POA: Diagnosis not present

## 2017-08-18 DIAGNOSIS — R7989 Other specified abnormal findings of blood chemistry: Secondary | ICD-10-CM

## 2017-08-18 DIAGNOSIS — E785 Hyperlipidemia, unspecified: Secondary | ICD-10-CM | POA: Diagnosis not present

## 2017-08-18 DIAGNOSIS — Z91199 Patient's noncompliance with other medical treatment and regimen due to unspecified reason: Secondary | ICD-10-CM

## 2017-08-18 DIAGNOSIS — N179 Acute kidney failure, unspecified: Secondary | ICD-10-CM

## 2017-08-18 MED ORDER — HYDRALAZINE HCL 10 MG PO TABS: 10.0000 mg | ORAL_TABLET | Freq: Two times a day (BID) | ORAL | 2 refills | Status: DC

## 2017-08-18 NOTE — Patient Instructions (Signed)
Medication Instructions:   ADD hydralazine 10mg  TWICE DAILY  Continue your labetaolol, sevelamer, and torsemide as currently dosed.  Labwork:   BMET today (non-fasting test)  BMET in 1 week (non-fasting test)  Testing/Procedures:  none  Follow-Up:  With Almyra Deforest PA in 2-3 weeks   If you need a refill on your cardiac medications before your next appointment, please call your pharmacy.  We are referring you to nephrology (kidney specialist) for further evaluation. Someone will be in contact with you to set up this appointment.

## 2017-08-18 NOTE — Progress Notes (Signed)
Cardiology Office Note    Date:  08/19/2017   ID:  GABRYEL TALAMO, DOB 1961/07/09, MRN [id number]  PCP:  Arnoldo Morale, MD  Cardiologist:  Referred by [facility] to Dr. Gwenlyn Found. Previously Dr. Debara Pickett (however does not appears to have ever been seen by Dr. Debara Pickett)   Chief Complaint  Patient presents with  . Follow-up    referred by [facility] for management of BP    History of Present Illness:  IRASEMA CHALK is a 56 y.o. female with PMH of chronic diastolic HF, HTN, HLD, type 2 DM, and stage III CKD. She has a history of noncompliance. She was initially evaluated at [facility] ED in November 2017 for chest pain. She was noted to have serum creatinine of 2.5. She reported noncompliance with her antihypertensive medication for the past one year. Serial troponin was borderline elevated. Echocardiogram showed normal LVEF with grade 2 DD. Myoview was abnormal, she was hydrated for a few days until her creatinine improved enough to proceed with diagnostic cardiac catheterization. Cardiac catheterization fortunately only showed minor CAD. Her last cardiology visit was in November 2017 for follow-up, her Lasix was reduced to 40 mg daily at the time. More recently, she was admitted to [facility] with acute on chronic renal insufficiency. She presented with 2 weeks worth of progressively worsening shortness of breath and was admitted for hypertensive emergency with blood pressure of 220. Creatinine was 5 on arrival. She also had pulmonary edema and bilateral lower extremity edema. She was started on Cipro In the ED, later transitioned to the BiPAP due to increased work of breathing, somnolence and tachypnea. Initially, effort was made to reinitiate carvedilol and add IV Nitrol to control the blood pressure without success. She was admitted to ICU and started on Clevidipine drip. ENT was 4068. White blood cell count 23.4.  She was diuresed. Later she was transitioned to oral amlodipine, labetalol and hydralazine,  however this dropped her blood pressure too much and it worsened her headache AI. She was diuresed with torsemide 40 mg twice a day and was net -10 L at discharge. TTE obtained during the admission showed normal EF. She was eventually discharged on labetalol 400 mg twice a day along with torsemide. She is also on sevelamer and was told to follow-up with nephrology team upon returning to [place] area. Hemoglobin A1c obtained during this admission was 8.0. She presents today for cardiology office visit. She says she is somewhat distrustful of medical providers as her renal function worsened during both 2017 admission and also recent admission at [facility]. I explained to her that her severely elevated blood pressure will eventually worsen her renal function, however sudden drop in the blood pressure can also worsen her renal function as well. I'm in favor of stepwise approach to slowly drop her blood pressure into a goal of 150-160 range. She has recently obtained laboratory work at [place] office, which shows her creatinine has improved to 5. I will repeat a BMET. She has not been established with a primary care provider at this time, she planned to be established with [facility] physicians. I don't think it will be prudent to wait for a referral to nephrology later, I will refer her myself.    Past Medical History:  Diagnosis Date  . Chronic diastolic CHF (congestive heart failure) ([place])    a. 03/2015: echo w/ EF of 50-55%, no WMA, Grade 2 DD, trivial AI, mild MR.  . Diabetes mellitus without complication ([place])   .  Hypertension     Past Surgical History:  Procedure Laterality Date  . CARDIAC CATHETERIZATION N/A 11/02/2016   Procedure: Left Heart Cath and Coronary Angiography;  Surgeon: Sherren Mocha, MD;  Location: [place] [facility];  Service: Cardiovascular;  Laterality: N/A;  . CESAREAN SECTION    . FRACTURE SURGERY    . TUBAL LIGATION      Current Medications: Outpatient Medications  Prior to Visit  Medication Sig Dispense Refill  . amLODipine (NORVASC) 10 MG tablet TAKE 1 TABLET BY MOUTH DAILY. 30 tablet 2  . aspirin 81 MG chewable tablet Chew 81 mg by mouth daily.    Marland Kitchen atorvastatin (LIPITOR) 80 MG tablet Take 1 tablet (80 mg total) by mouth daily at 6 PM. 30 tablet 0  . Blood Glucose Monitoring Suppl (TRUE METRIX METER) W/DEVICE KIT Check blood sugar 4 times daily AC and HS 1 kit 0  . carvedilol (COREG) 25 MG tablet TAKE 1 TABLET BY MOUTH 2 TIMES DAILY WITH A MEAL. 60 tablet 5  . Collagen 500 MG CAPS Take 1,500 mg by mouth daily.    . Digestive Enzymes (ENZYME DIGEST) CAPS Take 1 capsule by mouth daily.    . furosemide (LASIX) 40 MG tablet Take 1 tablet (40 mg total) by mouth daily. 30 tablet 5  . glipiZIDE (GLUCOTROL) 5 MG tablet Take 1 tablet (5 mg total) by mouth daily before breakfast. 30 tablet 5  . lisinopril (PRINIVIL,ZESTRIL) 5 MG tablet Take 1 tablet (5 mg total) by mouth daily. 30 tablet 3  . Multiple Vitamin (MULTIVITAMIN WITH MINERALS) TABS tablet Take 1 tablet by mouth daily.    . nitroGLYCERIN (NITROSTAT) 0.4 MG SL tablet Place 1 tablet (0.4 mg total) under the tongue every 5 (five) minutes x 3 doses as needed for chest pain. 25 tablet 2  . Omega-3 Fatty Acids (FISH OIL) 1000 MG CAPS Take 2,000 mg by mouth daily.     No facility-administered medications prior to visit.      Allergies:   Vioxx [rofecoxib]; Compazine [prochlorperazine edisylate]; and Prochlorperazine   Social History   Social History  . Marital status: Married    Spouse name: N/A  . Number of children: N/A  . Years of education: N/A   Social History Main Topics  . Smoking status: Former Smoker    Quit date: 03/22/1995  . Smokeless tobacco: Never Used  . Alcohol use No  . Drug use: No  . Sexual activity: No   Other Topics Concern  . None   Social History Narrative  . None     Family History:  The patient's family history includes Congestive Heart Failure in her brother and  son; Diabetes Mellitus II in her mother; Heart attack (age of onset: 25) in her father; Heart disease in her father; Hyperlipidemia in her mother; Hypertension in her mother; Kidney disease in her mother.   ROS:   Please see the history of present illness.    ROS All other systems reviewed and are negative.   PHYSICAL EXAM:   VS:  BP (!) 197/101   Pulse 72   Ht _0  (1.626 m)   Wt 276 lb (125.2 kg)   LMP 02/02/2015   BMI 47.38 kg/m    GEN: Well nourished, well developed, in no acute distress  HEENT: normal  Neck: no JVD, carotid bruits, or masses Cardiac: RRR; no murmurs, rubs, or gallops,no edema  Respiratory:  clear to auscultation bilaterally, normal work of breathing GI: soft, nontender, nondistended, +  BS MS: no deformity or atrophy  Skin: warm and dry, no rash Neuro:  Alert and Oriented x 3, Strength and sensation are intact Psych: euthymic mood, full affect  Wt Readings from Last 3 Encounters:  08/18/17 276 lb (125.2 kg)  12/03/16 263 lb 6.4 oz (119.5 kg)  11/09/16 263 lb 3.2 oz (119.4 kg)      Studies/Labs Reviewed:   EKG:  EKG is not ordered today.    Recent Labs: 10/24/2016: B Natriuretic Peptide 144.9; TSH 2.330 11/02/2016: Hemoglobin 10.6; Platelets 271 12/03/2016: ALT 18 08/18/2017: BUN 49; Creatinine, Ser 4.96; Potassium 4.4; Sodium 142   Lipid Panel    Component Value Date/Time   CHOL 323 (H) 10/24/2016 1822   TRIG 236 (H) 10/24/2016 1822   HDL 70 10/24/2016 1822   CHOLHDL 4.6 10/24/2016 1822   VLDL 47 (H) 10/24/2016 1822   LDLCALC 206 (H) 10/24/2016 1822    Additional studies/ records that were reviewed today include:   Echo 07/30/2017 NORMAL LEFT VENTRICULAR SYSTOLIC FUNCTION WITH MILD LVH   NORMAL RIGHT VENTRICULAR SYSTOLIC FUNCTION   VALVULAR REGURGITATION: TRIVIAL AR, TRIVIAL MR, TRIVIAL PR, TRIVIAL TR   NO VALVULAR STENOSIS   Essentially negative echocardiogram other than left ventricular   hypertrophy.   NO PRIOR STUDY FOR  COMPARISON   Renal US 07/30/2017 Findings: Markedly limited exam secondary to body habitus. The right kidney measures 10.0 cm. There is no hydronephrosis.  The left kidney measures 10.8 cm.  There is no hydronephrosis.  The urinary bladder is partially distended and unremarkable.  Impression: Markedly limited exam of the kidneys with no evidence of hydronephrosis.  ASSESSMENT:    1. Malignant hypertension   2. Elevated serum creatinine   3. Acute renal failure superimposed on chronic kidney disease, unspecified CKD stage, unspecified acute renal failure type ([place])   4. Chronic kidney disease (CKD), stage IV (severe) ([place])   5. Encounter for long-term (current) use of medications   6. Chronic diastolic heart failure ([place])   7. Hyperlipidemia, unspecified hyperlipidemia type   8. Controlled type 2 diabetes mellitus without complication, without long-term current use of insulin ([place])   9. Noncompliance      PLAN:  In order of problems listed above:  1. Malignant hypertension: Currently on labetalol 400 mg twice a day. Will need to be careful when adjusting blood pressure medication to avoid a significant injury to her kidney. Will add hydralazine 10 mg twice a day and the slowly uptitrate to reach her goal systolic blood pressure 683-419. Patient has a history of noncompliance and mistrust of healthcare system, however I think it is very possible to develop a good rapport with her when addressing her own concern.  2. Acute on chronic renal insufficiency: Creatinine around 1.3 in 2016, however increased to 2.0 by December 2017, during most recent admission, creatinine peaked at 7.1. Obtained creatinine at the [place] last Monday, creatinine reportedly improved to 5. Will obtain basic metabolic panel today to further reassess. Will also refer the patient to nephrology service. I will obtain a repeat basic metabolic panel in one week.  3. DM II: Currently not on any medication,  hemoglobin A1c 8.0 at [facility]. Patient plans to establish with the [facility].  4. Hyperlipidemia: She has a history of hyperlipidemia, however will address blood pressure and renal function first  5. Noncompliance: She has some degree of mistrust of healthcare provider. However it appears she is very willing to try everything to improve her renal function and  she understands the seriousness.    Medication Adjustments/Labs and Tests Ordered: Current medicines are reviewed at length with the patient today.  Concerns regarding medicines are outlined above.  Medication changes, Labs and Tests ordered today are listed in the Patient Instructions below. Patient Instructions  Medication Instructions:   ADD hydralazine 24m TWICE DAILY  Continue your labetaolol, sevelamer, and torsemide as currently dosed.  Labwork:   BMET today (non-fasting test)  BMET in 1 week (non-fasting test)  Testing/Procedures:  none  Follow-Up:  With HAlmyra DeforestPA in 2-3 weeks   If you need a refill on your cardiac medications before your next appointment, please call your pharmacy.  We are referring you to nephrology (kidney specialist) for further evaluation. Someone will be in contact with you to set up this appointment.    SHilbert Corrigan PUtah 08/19/2017 11:06 PM    [address] [place]  [id number]: [phone] Fax: [phone]

## 2017-08-19 ENCOUNTER — Encounter: Payer: Self-pay | Admitting: Physician Assistant

## 2017-08-19 LAB — BASIC METABOLIC PANEL
BUN/Creatinine Ratio: 10 (ref 9–23)
BUN: 49 mg/dL — AB (ref 6–24)
CALCIUM: 8.4 mg/dL — AB (ref 8.7–10.2)
CO2: 21 mmol/L (ref 20–29)
CREATININE: 4.96 mg/dL — AB (ref 0.57–1.00)
Chloride: 105 mmol/L (ref 96–106)
GFR calc Af Amer: 11 mL/min/{1.73_m2} — ABNORMAL LOW (ref >59)
GFR calc non Af Amer: 9 mL/min/{1.73_m2} — ABNORMAL LOW (ref >59)
GLUCOSE: 155 mg/dL — AB (ref 65–99)
Potassium: 4.4 mmol/L (ref 3.5–5.2)
Sodium: 142 mmol/L (ref 134–144)

## 2017-08-24 ENCOUNTER — Telehealth: Payer: Self-pay | Admitting: Physician Assistant

## 2017-08-24 NOTE — Telephone Encounter (Signed)
Received Disability/Leave form from [place] for [place], [place] to review, complete and sign.  No AUTH/Pmt received.  Sent forms to [place] @ [place] for letter to be sent to patient.  Sent via [place] on 08/24/17. lp

## 2017-09-03 ENCOUNTER — Ambulatory Visit: Payer: Managed Care, Other (non HMO) | Admitting: Physician Assistant

## 2017-09-08 ENCOUNTER — Telehealth: Payer: Self-pay | Admitting: Physician Assistant

## 2017-09-08 NOTE — Telephone Encounter (Signed)
Received incoming records from [facility] for upcoming appointment on 09/16/17 @ 9am with Almyra Deforest. Records given to [facility] in Medical Records. 09/08/17 ab

## 2017-09-16 ENCOUNTER — Encounter: Payer: Self-pay | Admitting: Physician Assistant

## 2017-09-16 ENCOUNTER — Ambulatory Visit (INDEPENDENT_AMBULATORY_CARE_PROVIDER_SITE_OTHER): Payer: Managed Care, Other (non HMO) | Admitting: Physician Assistant

## 2017-09-16 VITALS — BP 178/98 | HR 78 | Ht 64.0 in | Wt 272.6 lb

## 2017-09-16 DIAGNOSIS — E119 Type 2 diabetes mellitus without complications: Secondary | ICD-10-CM

## 2017-09-16 DIAGNOSIS — Z79899 Other long term (current) drug therapy: Secondary | ICD-10-CM | POA: Diagnosis not present

## 2017-09-16 DIAGNOSIS — Z9119 Patient's noncompliance with other medical treatment and regimen: Secondary | ICD-10-CM | POA: Diagnosis not present

## 2017-09-16 DIAGNOSIS — I5032 Chronic diastolic (congestive) heart failure: Secondary | ICD-10-CM

## 2017-09-16 DIAGNOSIS — I10 Essential (primary) hypertension: Secondary | ICD-10-CM | POA: Diagnosis not present

## 2017-09-16 DIAGNOSIS — N183 Chronic kidney disease, stage 3 unspecified: Secondary | ICD-10-CM

## 2017-09-16 DIAGNOSIS — E785 Hyperlipidemia, unspecified: Secondary | ICD-10-CM | POA: Diagnosis not present

## 2017-09-16 DIAGNOSIS — Z91199 Patient's noncompliance with other medical treatment and regimen due to unspecified reason: Secondary | ICD-10-CM

## 2017-09-16 LAB — BASIC METABOLIC PANEL
BUN/Creatinine Ratio: 12 (ref 9–23)
BUN: 59 mg/dL — ABNORMAL HIGH (ref 6–24)
CO2: 23 mmol/L (ref 20–29)
Calcium: 8 mg/dL — ABNORMAL LOW (ref 8.7–10.2)
Chloride: 101 mmol/L (ref 96–106)
Creatinine, Ser: 4.95 mg/dL — ABNORMAL HIGH (ref 0.57–1.00)
GFR, EST AFRICAN AMERICAN: 11 mL/min/{1.73_m2} — AB (ref >59)
GFR, EST NON AFRICAN AMERICAN: 9 mL/min/{1.73_m2} — AB (ref >59)
Glucose: 163 mg/dL — ABNORMAL HIGH (ref 65–99)
POTASSIUM: 4.1 mmol/L (ref 3.5–5.2)
SODIUM: 142 mmol/L (ref 134–144)

## 2017-09-16 MED ORDER — HYDRALAZINE HCL 25 MG PO TABS: 25.0000 mg | ORAL_TABLET | Freq: Two times a day (BID) | ORAL | 3 refills | Status: DC

## 2017-09-16 NOTE — Patient Instructions (Signed)
Medication Instructions:   INCREASE Hydralazine to 25mg  TWICE DAILY A prescription has been called to your local pharmacy.  Labwork:   BMET today  Testing/Procedures:  none  Follow-Up:  With Almyra Deforest PA in 1 month With Dr. Gwenlyn Found in 3 months  If you need a refill on your cardiac medications before your next appointment, please call your pharmacy.

## 2017-09-16 NOTE — Progress Notes (Signed)
Kidney function unchanged compare to 4 weeks ago. Electrolyte stable.

## 2017-09-16 NOTE — Progress Notes (Signed)
Cardiology Office Note    Date:  09/16/2017   ID:  Pamela Campbell, DOB [birth year], MRN [id number]  PCP:  Pamela Morale, MD  Cardiologist:  Referred by [facility] to Dr. Gwenlyn Campbell. Previously Dr. Debara Campbell (however does not appears to have ever been seen by Dr. Debara [place])   Chief Complaint  Patient presents with  . Follow-up    plan to setup with Dr. Gwenlyn Campbell    History of Present Illness:  Pamela Campbell is a 56 y.o. female with PMH of chronic diastolic HF, HTN, HLD, type 2 DM, and stage III CKD. She has a history of noncompliance. She was initially evaluated at [facility] ED in November 2017 for chest pain. She was noted to have serum creatinine of 2.5. She reported noncompliance with her antihypertensive medication for the past one year. Serial troponin was borderline elevated. Echocardiogram showed normal LVEF with grade 2 DD. Myoview was abnormal, she was hydrated for a few days until her creatinine improved enough to proceed with diagnostic cardiac catheterization. Cardiac catheterization fortunately only showed minor CAD. Her last cardiology visit was in November 2017 for follow-up, her Lasix was reduced to 40 mg daily at the time. More recently, she was admitted to [facility] with acute on chronic renal insufficiency. She presented with 2 weeks worth of progressively worsening shortness of breath and was admitted for hypertensive emergency with blood pressure of 220. Creatinine was 5 on arrival. She also had pulmonary edema and bilateral lower extremity edema. She was started on Cipro In the ED, later transitioned to the BiPAP due to increased work of breathing, somnolence and tachypnea. Initially, effort was made to reinitiate carvedilol and add IV Nitrol to control the blood pressure without success. She was admitted to ICU and started on Clevidipine drip. ENT was 4068. White blood cell count 23.4.  She was diuresed. Later she was transitioned to oral amlodipine, labetalol and hydralazine, however this  dropped her blood pressure too much and it worsened her headache AI. She was diuresed with torsemide 40 mg twice a day and was net -10 L at discharge. TTE obtained during the admission showed normal EF. She was eventually discharged on labetalol 400 mg twice a day along with torsemide. She is also on sevelamer and was told to follow-up with nephrology team upon returning to [place] area. Hemoglobin A1c obtained during this admission was 8.0. She presents today for cardiology office visit. She says she is somewhat distrustful of medical providers as her renal function worsened during both 2017 admission and also recent admission at [facility]. I last saw the patient on 08/18/2017.  I explained to her that her severely elevated blood pressure will eventually worsen her renal function, however sudden drop in the blood pressure can also worsen her renal function as well. I'm in favor of stepwise approach to slowly drop her blood pressure into a goal of 150-160 range. I added hydralazine 10 mg twice a day. She was supposed to return in a week for repeat basic metabolic panel, this did not appears to have occurred.  She presents today for cardiology office visit. Her torsemide has been increased to 60 mg twice a day by her nephrologist. She is also planning to switch nephrologist to Dr. Florene Campbell. She says her first appointment with her new PCP at the [facility] family medicine is in November. Her blood pressure is about 19 mmHg lower than our last visit, again our plan is slowly but steadily decrease her blood pressure. With initial systolic  blood pressure goal in the 150-160 range. I have increased her hydralazine to 25 mg twice a day. She will need a basic metabolic panel today. I will see her in one month and she will see Dr. Alvester Campbell in 3 month. Otherwise she denies any significant shortness of breath or chest discomfort.   Past Medical History:  Diagnosis Date  . Chronic diastolic CHF (congestive heart failure) ([place])    a.  03/2015: echo w/ EF of 50-55%, no WMA, Grade 2 DD, trivial AI, mild MR.  . Diabetes mellitus without complication ([place])   . Hypertension     Past Surgical History:  Procedure Laterality Date  . CARDIAC CATHETERIZATION N/A 11/02/2016   Procedure: Left Heart Cath and Coronary Angiography;  Surgeon: Pamela Mocha, MD;  Location: [place] [facility];  Service: Cardiovascular;  Laterality: N/A;  . CESAREAN SECTION    . FRACTURE SURGERY    . TUBAL LIGATION      Current Medications: Outpatient Medications Prior to Visit  Medication Sig Dispense Refill  . labetalol (NORMODYNE) 200 MG tablet Take 400 mg by mouth 2 (two) times daily.    . hydrALAZINE (APRESOLINE) 10 MG tablet Take 1 tablet (10 mg total) by mouth 2 (two) times daily. 60 tablet 2  . sevelamer carbonate (RENVELA) 800 MG tablet Take 1,600 mg by mouth 3 (three) times daily with meals.   0  . torsemide (DEMADEX) 20 MG tablet 40 mg 2 (two) times daily.   0   No facility-administered medications prior to visit.      Allergies:   Compazine [prochlorperazine edisylate]; Prochlorperazine; and Vioxx [rofecoxib]   Social History   Social History  . Marital status: Married    Spouse name: N/A  . Number of children: N/A  . Years of education: N/A   Social History Main Topics  . Smoking status: Former Smoker    Quit date: 03/22/1995  . Smokeless tobacco: Never Used  . Alcohol use No  . Drug use: No  . Sexual activity: No   Other Topics Concern  . None   Social History Narrative  . None     Family History:  The patient's family history includes Congestive Heart Failure in her brother and son; Diabetes Mellitus II in her mother; Heart attack (age of onset: 20) in her father; Heart disease in her father; Hyperlipidemia in her mother; Hypertension in her mother; Kidney disease in her mother.   ROS:   Please see the history of present illness.    ROS All other systems reviewed and are negative.   PHYSICAL EXAM:   VS:  BP (!)  178/98   Pulse 78   Ht 5\' 4"  (1.626 m)   Wt 272 lb 9.6 oz (123.7 kg)   LMP 02/02/2015   BMI 46.79 kg/m    GEN: Well nourished, well developed, in no acute distress  HEENT: normal  Neck: no JVD, carotid bruits, or masses Cardiac: RRR; no murmurs, rubs, or gallops,no edema  Respiratory:  clear to auscultation bilaterally, normal work of breathing GI: soft, nontender, nondistended, + BS MS: no deformity or atrophy  +legs in below knee compression stocking, has woody appearance.  Skin: warm and dry, no rash Neuro:  Alert and Oriented x 3, Strength and sensation are intact Psych: euthymic mood, full affect  Wt Readings from Last 3 Encounters:  09/16/17 272 lb 9.6 oz (123.7 kg)  08/18/17 276 lb (125.2 kg)  12/03/16 263 lb 6.4 oz (119.5 kg)  Studies/Labs Reviewed:   EKG:  EKG is not ordered today.    Recent Labs: 10/24/2016: B Natriuretic Peptide 144.9; TSH 2.330 11/02/2016: Hemoglobin 10.6; Platelets 271 12/03/2016: ALT 18 08/18/2017: BUN 49; Creatinine, Ser 4.96; Potassium 4.4; Sodium 142   Lipid Panel    Component Value Date/Time   CHOL 323 (H) 10/24/2016 1822   TRIG 236 (H) 10/24/2016 1822   HDL 70 10/24/2016 1822   CHOLHDL 4.6 10/24/2016 1822   VLDL 47 (H) 10/24/2016 1822   LDLCALC 206 (H) 10/24/2016 1822    Additional studies/ records that were reviewed today include:   Echo 07/30/2017 NORMAL LEFT VENTRICULAR SYSTOLIC FUNCTION WITH MILD LVH   NORMAL RIGHT VENTRICULAR SYSTOLIC FUNCTION   VALVULAR REGURGITATION: TRIVIAL AR, TRIVIAL MR, TRIVIAL PR, TRIVIAL TR   NO VALVULAR STENOSIS   Essentially negative echocardiogram other than left ventricular   hypertrophy.   NO PRIOR STUDY FOR COMPARISON   Renal US 07/30/2017 Findings: Markedly limited exam secondary to body habitus. The right kidney measures 10.0 cm. There is no hydronephrosis.  The left kidney measures 10.8 cm.  There is no hydronephrosis.  The urinary bladder is partially distended and  unremarkable.  Impression: Markedly limited exam of the kidneys with no evidence of hydronephrosis.   ASSESSMENT:    1. Malignant hypertension   2. Encounter for long-term (current) use of medications   3. Chronic diastolic heart failure ([place])   4. Hyperlipidemia, unspecified hyperlipidemia type   5. Controlled type 2 diabetes mellitus without complication, without long-term current use of insulin ([place])   6. CKD (chronic kidney disease), stage III ([place])   7. Noncompliance      PLAN:  In order of problems listed above:  1. Malignant hypertension: We are planning stepwise approach to decrease her blood pressure to a goal of 150-160, some of her renal injury is likely the result of chronically uncontrolled blood pressure. However she has had at least 2 episodes where her blood pressure was dropped into the normal range too fast and this caused additional injury to her kidney. I wonder if we should obtain bilateral renal artery ultrasound, she had a kidney ultrasound on yearly basis, however I do not see any history of bilateral renal artery ultrasound to rule out renal artery stenosis. I will defer this decision to Dr. Gwenlyn Campbell. Her blood pressure is improving compared to the last office visit, however still high, I will increase hydralazine to 25 mg twice a day. Although she says she has trust issues more medical providers in the past which resulting in noncompliance, we have developed very good patient-provider rapport over the last 2 visits.   2. Chronic diastolic heart failure: Torsemide has been increased to 60 mg twice a day by nephrologist, she is also planning to see Dr. Florene Campbell with [facility] at her friend's recommendation. She does not know the name of her current nephrologist. Otherwise she is euvolemic on physical exam.  3. Hyperlipidemia: We'll defer to primary care provider.  4. DM 2: Hemoglobin A1c 8.0 at [facility], currently not on any medication, she is in  establishing with [facility] physicians.  5. CKD stage III: Creatinine was 1.3 in 2016, however increased to 2.0 by December 2017. During recent admission at [facility], creatinine peaked at 7.1. Will obtain basic metabolic panel today. A portion of her kidney injury is related to chronically uncontrolled blood pressure, however there has been at least 2 instances that significant drop in her blood pressure resulted in further deterioration of the  kidney function. Therefore we are attempting stepwise approach to slowly decrease her blood pressure to a goal. May consider renal artery ultrasound to make sure she does not have bilateral renal artery stenosis.    Medication Adjustments/Labs and Tests Ordered: Current medicines are reviewed at length with the patient today.  Concerns regarding medicines are outlined above.  Medication changes, Labs and Tests ordered today are listed in the Patient Instructions below. Patient Instructions  Medication Instructions:   INCREASE Hydralazine to 25mg  TWICE DAILY A prescription has been called to your local pharmacy.  Labwork:   BMET today  Testing/Procedures:  none  Follow-Up:  With Almyra Deforest PA in 1 month With Dr. Gwenlyn Campbell in 3 months  If you need a refill on your cardiac medications before your next appointment, please call your pharmacy.      Hilbert Corrigan, [place]  09/16/2017 10:26 AM    [address], [place], [place]  [address] Phone: [phone]; Fax: [phone]

## 2017-09-20 ENCOUNTER — Telehealth: Payer: Self-pay | Admitting: Internal Medicine

## 2017-09-23 ENCOUNTER — Telehealth: Payer: Self-pay | Admitting: Physician Assistant

## 2017-09-23 NOTE — Telephone Encounter (Signed)
Returned call to patient. She states her nephrologist Dr. Florene Glen @ [facility] wants to get her BP down to normal but this is how her kidneys got messed up in the first place. She is uncomfortable with these med changes. She saw Dr. Florene Glen yesterday at 0800 and she had not taken her BP meds and her BP was higher than it was when she saw [place], [place] on 9/27. She made the med changes yesterday and felt like "crap" later in the day. She had lab work down yesterday. She reports she is anemic and it was recommended to her that she needs shot/IV done at [facility] recall the med name. Med change recommendations noted below:  Torsemide 100mg  BID (instead of 60mg  BID) Hydralazine 50mg  TID (instead of 25mg  BID) Labetalol dose/freq unchanged  She prefers to see nephrologist with [facility] would have to route message to [place], [place] to review and advise. She states she trusts [place], [place] and knows he cares.

## 2017-09-23 NOTE — Telephone Encounter (Signed)
Dr Florene Glen was trying to decrease the pressure effect on her kidney. I understand her concern as she did have worsening renal function a year ago at [facility] and recently at [facility] when her BP drop suddenly. If she is not feeling well after increasing the medication, she can hold off on the increase and go back to the previous dose. Since Dr. Florene Glen is the one who adjusted the medication, as courtesy, she should inform Dr. Florene Glen her concern as well. Cardiology team will continue to work with her other providers, if she adamant about change to [facility], ideally, it should be her primary care doctor who make that referral. I believe she is trying to setup with [place].  Isaac Laud

## 2017-09-23 NOTE — Telephone Encounter (Signed)
New message    Pt is calling stating she had an appt with her kidney doctor yesterday and they changed her medication. She wanted to make the nurse aware.

## 2017-09-23 NOTE — Telephone Encounter (Signed)
Faxed last OV notes to Dr. Abel Presto office for review.

## 2017-09-23 NOTE — Telephone Encounter (Signed)
Patient called w/PA advice. She voiced understanding. She appears to want to change nephrologist's to [facility] and I reiterated that this would need to be coordinated w/PCP

## 2017-09-23 NOTE — Telephone Encounter (Signed)
Returned call, no answer, left msg to call - I see she spoke to [place] earlier, unsure if duplicate encounter. Advised in msg to call back if she had unaddressed concerns from earlier call.

## 2017-09-23 NOTE — Telephone Encounter (Signed)
Received return call from patient. Pamela Campbell was available to speak to her - will route msg for him to add note pertinent to his discussion.

## 2017-09-23 NOTE — Telephone Encounter (Signed)
°  New Prob  Has some questions regarding her creatinine levels. Please call.

## 2017-09-27 ENCOUNTER — Telehealth: Payer: Self-pay | Admitting: Physician Assistant

## 2017-09-27 NOTE — Telephone Encounter (Signed)
Received records from [facility] for appointment on 10/15/17 with Almyra Deforest, PA.  Records put with Hao's schedule for 10/15/17.

## 2017-10-01 ENCOUNTER — Encounter (HOSPITAL_COMMUNITY): Payer: Managed Care, Other (non HMO)

## 2017-10-11 ENCOUNTER — Encounter (HOSPITAL_COMMUNITY): Payer: Self-pay | Admitting: Emergency Medicine

## 2017-10-11 ENCOUNTER — Observation Stay (HOSPITAL_COMMUNITY)
Admission: EM | Admit: 2017-10-11 | Discharge: 2017-10-12 | Disposition: A | Discharge status: Home / Self Care | Payer: Managed Care, Other (non HMO) | Attending: Student in an Organized Health Care Education/Training Program | Admitting: Student in an Organized Health Care Education/Training Program

## 2017-10-11 DIAGNOSIS — N185 Chronic kidney disease, stage 5: Secondary | ICD-10-CM | POA: Diagnosis not present

## 2017-10-11 DIAGNOSIS — I10 Essential (primary) hypertension: Secondary | ICD-10-CM

## 2017-10-11 DIAGNOSIS — E669 Obesity, unspecified: Secondary | ICD-10-CM | POA: Diagnosis not present

## 2017-10-11 DIAGNOSIS — I5032 Chronic diastolic (congestive) heart failure: Secondary | ICD-10-CM | POA: Insufficient documentation

## 2017-10-11 DIAGNOSIS — I160 Hypertensive urgency: Secondary | ICD-10-CM | POA: Diagnosis present

## 2017-10-11 DIAGNOSIS — D649 Anemia, unspecified: Secondary | ICD-10-CM

## 2017-10-11 DIAGNOSIS — D72829 Elevated white blood cell count, unspecified: Secondary | ICD-10-CM | POA: Diagnosis not present

## 2017-10-11 DIAGNOSIS — I25110 Atherosclerotic heart disease of native coronary artery with unstable angina pectoris: Secondary | ICD-10-CM | POA: Insufficient documentation

## 2017-10-11 DIAGNOSIS — I130 Hypertensive heart and chronic kidney disease with heart failure and stage 1 through stage 4 chronic kidney disease, or unspecified chronic kidney disease: Secondary | ICD-10-CM | POA: Diagnosis not present

## 2017-10-11 DIAGNOSIS — I132 Hypertensive heart and chronic kidney disease with heart failure and with stage 5 chronic kidney disease, or end stage renal disease: Secondary | ICD-10-CM | POA: Diagnosis not present

## 2017-10-11 DIAGNOSIS — E1122 Type 2 diabetes mellitus with diabetic chronic kidney disease: Secondary | ICD-10-CM | POA: Insufficient documentation

## 2017-10-11 DIAGNOSIS — Z888 Allergy status to other drugs, medicaments and biological substances status: Secondary | ICD-10-CM | POA: Diagnosis not present

## 2017-10-11 DIAGNOSIS — N179 Acute kidney failure, unspecified: Secondary | ICD-10-CM | POA: Diagnosis not present

## 2017-10-11 DIAGNOSIS — R1013 Epigastric pain: Secondary | ICD-10-CM

## 2017-10-11 DIAGNOSIS — Z79899 Other long term (current) drug therapy: Secondary | ICD-10-CM | POA: Insufficient documentation

## 2017-10-11 DIAGNOSIS — N183 Chronic kidney disease, stage 3 (moderate): Secondary | ICD-10-CM | POA: Insufficient documentation

## 2017-10-11 DIAGNOSIS — Z8269 Family history of other diseases of the musculoskeletal system and connective tissue: Secondary | ICD-10-CM

## 2017-10-11 DIAGNOSIS — Z87891 Personal history of nicotine dependence: Secondary | ICD-10-CM | POA: Insufficient documentation

## 2017-10-11 DIAGNOSIS — Z8261 Family history of arthritis: Secondary | ICD-10-CM

## 2017-10-11 DIAGNOSIS — Z8249 Family history of ischemic heart disease and other diseases of the circulatory system: Secondary | ICD-10-CM

## 2017-10-11 DIAGNOSIS — Z833 Family history of diabetes mellitus: Secondary | ICD-10-CM

## 2017-10-11 LAB — BASIC METABOLIC PANEL
Anion gap: 11 (ref 5–15)
BUN: 75 mg/dL — AB (ref 6–20)
CALCIUM: 7.9 mg/dL — AB (ref 8.9–10.3)
CO2: 25 mmol/L (ref 22–32)
Chloride: 99 mmol/L — ABNORMAL LOW (ref 101–111)
Creatinine, Ser: 6.08 mg/dL — ABNORMAL HIGH (ref 0.44–1.00)
GFR calc Af Amer: 8 mL/min — ABNORMAL LOW (ref >60)
GFR, EST NON AFRICAN AMERICAN: 7 mL/min — AB (ref >60)
GLUCOSE: 199 mg/dL — AB (ref 65–99)
Potassium: 4.3 mmol/L (ref 3.5–5.1)
Sodium: 135 mmol/L (ref 135–145)

## 2017-10-11 LAB — CBC
HEMATOCRIT: 28.7 % — AB (ref 36.0–46.0)
Hemoglobin: 9 g/dL — ABNORMAL LOW (ref 12.0–15.0)
MCH: 26.6 pg (ref 26.0–34.0)
MCHC: 31.4 g/dL (ref 30.0–36.0)
MCV: 84.9 fL (ref 78.0–100.0)
PLATELETS: 255 10*3/uL (ref 150–400)
RBC: 3.38 MIL/uL — ABNORMAL LOW (ref 3.87–5.11)
RDW: 15.4 % (ref 11.5–15.5)
WBC: 11 10*3/uL — AB (ref 4.0–10.5)

## 2017-10-11 LAB — RAPID URINE DRUG SCREEN, HOSP PERFORMED
AMPHETAMINES: NOT DETECTED
BENZODIAZEPINES: NOT DETECTED
Barbiturates: NOT DETECTED
COCAINE: NOT DETECTED
OPIATES: NOT DETECTED
Tetrahydrocannabinol: NOT DETECTED

## 2017-10-11 LAB — GLUCOSE, CAPILLARY
Glucose-Capillary: 174 mg/dL — ABNORMAL HIGH (ref 65–99)
Glucose-Capillary: 128 mg/dL — ABNORMAL HIGH (ref 65–99)

## 2017-10-11 LAB — CBG MONITORING, ED: Glucose-Capillary: 160 mg/dL — ABNORMAL HIGH (ref 65–99)

## 2017-10-11 MED ORDER — INSULIN ASPART 100 UNIT/ML ~~LOC~~ SOLN
0.0000 [IU] | Freq: Three times a day (TID) | SUBCUTANEOUS | Status: DC
  Administered 2017-10-11 – 2017-10-12 (×3): 2 [IU] via SUBCUTANEOUS
  Filled 2017-10-11: qty 1

## 2017-10-11 MED ORDER — GI COCKTAIL ~~LOC~~: 30.0000 mL | Freq: Two times a day (BID) | ORAL | Status: DC | PRN

## 2017-10-11 MED ORDER — HYDRALAZINE HCL 25 MG PO TABS
25.0000 mg | ORAL_TABLET | Freq: Two times a day (BID) | ORAL | Status: DC
  Administered 2017-10-11 – 2017-10-12 (×3): 25 mg via ORAL
  Filled 2017-10-11 (×3): qty 1

## 2017-10-11 MED ORDER — TORSEMIDE 20 MG PO TABS
40.0000 mg | ORAL_TABLET | Freq: Two times a day (BID) | ORAL | Status: DC
  Administered 2017-10-11 – 2017-10-12 (×3): 40 mg via ORAL
  Filled 2017-10-11 (×3): qty 2

## 2017-10-11 MED ORDER — PANTOPRAZOLE SODIUM 40 MG PO TBEC
40.0000 mg | DELAYED_RELEASE_TABLET | Freq: Every day | ORAL | Status: DC
  Administered 2017-10-11 – 2017-10-12 (×2): 40 mg via ORAL
  Filled 2017-10-11 (×2): qty 1

## 2017-10-11 MED ORDER — CALCITRIOL 0.25 MCG PO CAPS
0.2500 ug | ORAL_CAPSULE | Freq: Every day | ORAL | Status: DC
  Administered 2017-10-11 – 2017-10-12 (×2): 0.25 ug via ORAL
  Filled 2017-10-11 (×2): qty 1

## 2017-10-11 MED ORDER — LABETALOL HCL 200 MG PO TABS: 400.0000 mg | ORAL_TABLET | Freq: Two times a day (BID) | ORAL | Status: DC

## 2017-10-11 MED ORDER — LABETALOL HCL 5 MG/ML IV SOLN
20.0000 mg | Freq: Once | INTRAVENOUS | Status: DC
  Filled 2017-10-11: qty 4

## 2017-10-11 MED ORDER — LABETALOL HCL 200 MG PO TABS
400.0000 mg | ORAL_TABLET | Freq: Two times a day (BID) | ORAL | Status: DC
  Administered 2017-10-11 – 2017-10-12 (×2): 400 mg via ORAL
  Filled 2017-10-11 (×2): qty 2

## 2017-10-11 MED ORDER — ENOXAPARIN SODIUM 30 MG/0.3ML ~~LOC~~ SOLN
30.0000 mg | SUBCUTANEOUS | Status: DC
  Administered 2017-10-11: 30 mg via SUBCUTANEOUS
  Filled 2017-10-11: qty 0.3

## 2017-10-11 NOTE — ED Triage Notes (Signed)
Reports feeling pressure in head this  Morning so she checked her blood pressure.  Reports it was high at home so she came in.  Took normal dose of bp meds at 0300.  Took extra dose of labetalol 200mg  an hour pta.

## 2017-10-11 NOTE — ED Notes (Signed)
Ordered pt diety tray.

## 2017-10-11 NOTE — H&P (Signed)
Date: 10/11/2017               Patient Name:  Pamela Campbell MRN: [id number]  DOB: 16-Mar-1961 Age / Sex: 56 y.o., female   PCP: Arnoldo Morale, MD         Medical Service: Internal Medicine Teaching Service         Attending Physician: Dr. Evette Doffing, Mallie Mussel    First Contact: Dr. Tarri Abernethy Pager: [phone]  Second Contact: Dr. Heber  Pager: [phone]       After Hours (After 5p/  First Contact Pager: [phone]  weekends / holidays): Second Contact Pager: [phone]   Chief Complaint: "I felt funny this morning."  History of Present Illness: Patient is a 56 year old female with a past medical history of uncontrolled hypertension, diabetes, diastolic congestive heart failure, chronic kidney disease presenting to the hospital after feeling "funny" this morning. Patient is not able to describe exactly how she felt. States she checked her blood pressure and it was 265/135. Reports taking all of her blood pressure medications including torsemide, hydralazine, and labetalol earlier at 3 AM this morning. After checking her blood pressure, patient took an additional dose of labetalol 200 mg. She denies having any headaches, vision changes, neck stiffness, chest pain, shortness of breath, or abdominal pain. Denies having any fevers or chills. States her blood pressure is chronically elevated with readings in the 180s to 190s at home. She reports compliance with her home blood pressure medications.She was recently admitted to [facility] from 07/28/2017 to 08/05/2017 for acute on chronic renal insufficiency due to hypertensive emergency. Patient states she has followed up with 2 nephrologists including Dr. Florene Glen and Dr. Jimmy Footman since her hospital discharge but does not wish to see them again because "I didn't trust their judgment." She is scheduled to see a new PCP at [facility] in November and is planning to get a referral from her new PCP to see a nephrologist at [facility].  Patient's only  other complaint is having intermittent nausea and vomiting since her hospital discharge from [facility] which she attributes to taking her blood pressure medications. States her last episode of vomiting was 5 days ago and since then she has been having good oral intake. Denies having any abdominal pain,GERD symptoms, dysphagia, odynophagia, diarrhea, or constipation. States her nausea gets better after eating and ranitidine helps as well. No other complaints.  Meds:  Current Meds  Medication Sig  . calcitRIOL (ROCALTROL) 0.25 MCG capsule Take 0.25 capsules by mouth daily.  . hydrALAZINE (APRESOLINE) 25 MG tablet Take 1 tablet (25 mg total) by mouth 2 (two) times daily.  Marland Kitchen labetalol (NORMODYNE) 200 MG tablet Take 400 mg by mouth 2 (two) times daily.  . RaNITidine HCl (RANITIDINE 150 MAX STRENGTH PO) Take 150 tablets by mouth every 12 (twelve) hours as needed (nausea).  . torsemide (DEMADEX) 20 MG tablet Take 40 mg by mouth 2 (two) times daily.      Allergies: Allergies as of 10/11/2017 - Review Complete 10/11/2017  Allergen Reaction Noted  . Compazine [prochlorperazine edisylate] Swelling 01/17/2013  . Prochlorperazine Swelling 06/04/2015  . Vioxx [rofecoxib] Other (See Comments) 01/17/2013   Past Medical History:  Diagnosis Date  . Chronic diastolic CHF (congestive heart failure) ([place])    a. 03/2015: echo w/ EF of 50-55%, no WMA, Grade 2 DD, trivial AI, mild MR.  . Diabetes mellitus without complication ([place])   . Hypertension     Family History: Mother had hypertension and diabetes;  died at age 16. Father had MI and died at age 42. Son has congestive heart failure. Daughter has Sjogren syndrome, fibromyalgia, and rheumatoid arthritis.  Social History: Former smoker, quit 20 years ago. Was previously smoking one pack of cigarettes per day for 10 years. Denies any ethanol or illicit drug use.  Review of Systems: A complete ROS was negative except as per HPI.   Physical Exam: Blood  pressure (!) 178/85, pulse 65, temperature 98.4 F (36.9 C), temperature source Oral, resp. rate 18, height 5\' 4"  (1.626 m), weight 278 lb 6 oz (126.3 kg), last menstrual period 02/02/2015, SpO2 98 %. Physical Exam  Constitutional: She is oriented to person, place, and time. She appears well-developed and well-nourished. No distress.  HENT:  Head: Normocephalic and atraumatic.  Mouth/Throat: Oropharynx is clear and moist.  Eyes: Pupils are equal, round, and reactive to light. EOM are normal.  Neck: Neck supple. No tracheal deviation present.  Cardiovascular: Normal rate, regular rhythm and intact distal pulses.  Exam reveals no gallop and no friction rub.   No murmur heard. Pulmonary/Chest: Effort normal and breath sounds normal. No respiratory distress. She has no wheezes. She has no rales.  Abdominal: Soft. Bowel sounds are normal. She exhibits no distension. There is no tenderness.  Musculoskeletal:  Trace bilateral lower extremity edema up to mid-shin level  Neurological: She is alert and oriented to person, place, and time. No cranial nerve deficit.  Strength 5 out of 5 and sensation to light touch intact throughout.  Skin: Skin is warm and dry.  Psychiatric: She has a normal mood and affect. Her behavior is normal.    Assessment & Plan by Problem: Active Problems:   Hypertensive urgency  56 year old female with a past medical history of uncontrolled hypertension, diabetes, diastolic congestive heart failure, chronic kidney disease presenting to the hospital for further evaluation of uncontrolled HTN.    Hypertensive urgency: Per PCP note from December 2017, patient has a history of uncontrolled hypertension in the setting of medication noncompliance. She has had multiple past admissions for hypertensive emergency; most recent at [place] from 07/28/2017 to 08/05/2017 for acute on chronic renal insufficiency. Her current medication regimen includes torsemide 40 mg twice daily, hydralazine  25 mg twice daily, and labetalol 400 mg twice daily. Patient took all of her home medications this morning with an additional 200 mg of labetalol. Blood pressure on presentation was 227/105; now improved to 178/85. No signs or symptoms of end organ damage. Her creatinine is elevated at 6.0 but is improved since her hospital discharge from [place] (6.9 at that time). She was seen by cardiology on 09/16/2017 and they increased the dose of hydralazine to 25 mg twice daily at that time; recommended goal sytolic blood pressure [phone]. -Continue home medications at this time. Goal is to decrease blood pressure no more than 25% in the first 24 hours. Her blood pressure needs to be titrated down very cautiously as she had worsening of her renal function at [facility] in response to a significant drop in her blood pressure. Consider starting low dose CCB if BP continues to elevated.  -EKG -Check blood pressure every 4 hours  Mild leukocytosis: likely stress reaction. Patient is afebrile. No signs or symptoms of systemic infection. -Repeat CBC in a.m.  Dyspepsia: Patient reports having intermittent episodes of nausea and vomiting since her hospital discharge from [facility]. Last episode of vomiting was 5 days ago. At present, she reports having good oral intake. Nausea is improved after eating and  by taking over-the-counter H2 blocker ranitidine. No abdominal pain, dysphagia, odynophagia, diarrhea, or constipation. -Protonix 40 mg daily -GI cocktail twice dailyas needed -CMP in am to check hepatic function   CKD stage V: Creatinine 6.0, BUN 75, and GFR 8; baseline Cr was around 2 in December 2017. Patient was recently admitted at [facility] for acute on chronic renal insufficiency due to hypertensive emergency. Her creatinine peaked at 7.1 during that admission and was 6.9 at discharge. No acidosis, significant electrolyte abnormalities, volume overload, or uremic symptoms. No indicatons for emergenct dialysis at this  time -Continue home calcitriol 0.25 mg daily -ACE inhibitor or ARB contraindicated in the setting of low GFR -Recheck renal function in a.m. -She will need outpatient nephrology follow-up. Patient wishes to go to [facility].   Chronic diastolic congestive heart failure: Last echo done in November 2017 showing normal left ventricular ejection fraction of 55-60% and grade 2 diastolic dysfunction. She has trace bilateral lower extremity edema but lungs are clear on exam and does not appear to be grossly volume overloaded. Weight stable.  -Continue home torsemide 40 mg twice daily -Continue home labetalol 400 mg twice daily  Type 2 diabetes: Last A1c 8.0 on 07/28/2017 at [facility]. CBG 199 on admission. She is currently not on any hypoglycemic agents at home. -Sliding-scale insulin sensitive -Metformin contraindicated in the setting of poor renal function. -Consider starting her on basal insulin if blood glucose remains uncontrolled.    Normocytic anemia: Likely in setting of CKD stage 3. Hemoglobin 9; baseline between 10-11. -Repeat CBC in am  -Continue to monitor  DVT ppx: Lovenox 30 mg daily  Diet: HH and CM Code: Full (confirmed with patient)  Dispo: Admit patient to Observation with expected length of stay less than 2 midnights.  Signed: Shela Leff, MD 10/11/2017, 9:38 AM  Pager: [phone]

## 2017-10-11 NOTE — ED Notes (Signed)
Pt able to walk to restroom, tolerated well. Pt eating lunch now.

## 2017-10-11 NOTE — ED Provider Notes (Signed)
[facility] EMERGENCY DEPARTMENT Provider Note   CSN: [id number] Arrival date & time: 10/11/17  1740     History   Chief Complaint Chief Complaint  Patient presents with  . Hypertension    HPI Pamela Campbell is a 56 y.o. female.  HPI  56 y.o. female with a hx of HTN, DM, Chronic Diastolic CHF, CKD Stage III, presents to the Emergency Department today due to high blood pressure. Pt checked this morning and notes that it was "high." Took BP meds this morning around 0300. Pt took an extra 200mg  Labetolol PTA. Seen by Cardiology on 09-16-16. Pt had hydralazine increased to 25mg  BID. Pt currently takes Torsemide, Hydralazine, and Labetolol. Per Cards note, pt with possible rental artery stenosis. Pt denies symptoms currently. No N/V/D. No CP/SOB/ABD pain. No headaches or visual changes. No fevers. No neck stiffness. No back pain. No other symptoms noted.   Past Medical History:  Diagnosis Date  . Chronic diastolic CHF (congestive heart failure) ([place])    a. 03/2015: echo w/ EF of 50-55%, no WMA, Grade 2 DD, trivial AI, mild MR.  . Diabetes mellitus without complication ([place])   . Hypertension     Patient Active Problem List   Diagnosis Date Noted  . Hepatitis C antibody test positive 12/08/2016  . Minor CAD    . Abnormal stress test   . Essential hypertension   . Acute kidney injury ([place])   . Unstable angina pectoris ([place])   . Hypertensive emergency 10/24/2016  . Chest pain with high risk for cardiac etiology 10/24/2016  . Acute-on-chronic kidney injury ([place]) 10/24/2016  . CKD (chronic kidney disease) stage 3, GFR 30-59 ml/min (HCC) 07/17/2015  . HLD (hyperlipidemia) 07/17/2015  . Type 2 diabetes mellitus ([place]) 03/27/2015  . Chronic diastolic congestive heart failure ([place]) 03/25/2015  . Accelerated hypertension 03/25/2015  . Obesity BMI 45 03/25/2015  . Sleep apnea 03/25/2015  . Dyslipidemia 03/25/2015  . Elevated troponin-demand ischemia 03/24/2015  .  Hypertensive heart disease 03/24/2015  . Shortness of breath 03/24/2015    Past Surgical History:  Procedure Laterality Date  . CARDIAC CATHETERIZATION N/A 11/02/2016   Procedure: Left Heart Cath and Coronary Angiography;  Surgeon: Sherren Mocha, MD;  Location: [facility];  Service: Cardiovascular;  Laterality: N/A;  . CESAREAN SECTION    . FRACTURE SURGERY    . TUBAL LIGATION      OB History    No data available       Home Medications    Prior to Admission medications   Medication Sig Start Date End Date Taking? Authorizing Provider  hydrALAZINE (APRESOLINE) 25 MG tablet Take 1 tablet (25 mg total) by mouth 2 (two) times daily. 09/16/17   Almyra Deforest, PA  Iron-Vitamins (GERITOL) LIQD Take 1 Dose by mouth daily.    [provider]  labetalol (NORMODYNE) 200 MG tablet Take 400 mg by mouth 2 (two) times daily.    [provider]  torsemide (DEMADEX) 20 MG tablet Take 60 mg by mouth 2 (two) times daily. 09/04/17   [provider]    Family History Family History  Problem Relation Age of Onset  . Diabetes Mellitus II Mother   . Hypertension Mother   . Hyperlipidemia Mother   . Kidney disease Mother   . Heart disease Father   . Heart attack Father 2  . Congestive Heart Failure Brother   . Congestive Heart Failure Son     Social History Social History  Substance Use Topics  . Smoking status: Former Smoker    Quit date: 03/22/1995  . Smokeless tobacco: Never Used  . Alcohol use No     Allergies   Compazine [prochlorperazine edisylate]; Prochlorperazine; and Vioxx [rofecoxib]   Review of Systems Review of Systems ROS reviewed and all are negative for acute change except as noted in the HPI.  Physical Exam Updated Vital Signs BP (!) 226/109 (BP Location: Right Arm)   Pulse 74   Temp 98.4 F (36.9 C) (Oral)   Resp 18   Ht 5\' 4"  (1.626 m)   Wt 126.3 kg (278 lb 6 oz)   LMP 02/02/2015   SpO2 99%   BMI 47.78 kg/m   Physical  Exam  Constitutional: She is oriented to person, place, and time. She appears well-developed and well-nourished. No distress.  HENT:  Head: Normocephalic and atraumatic.  Right Ear: Tympanic membrane, external ear and ear canal normal.  Left Ear: Tympanic membrane, external ear and ear canal normal.  Nose: Nose normal.  Mouth/Throat: Uvula is midline, oropharynx is clear and moist and mucous membranes are normal. No trismus in the jaw. No oropharyngeal exudate, posterior oropharyngeal erythema or tonsillar abscesses.  Eyes: Pupils are equal, round, and reactive to light. EOM are normal.  Neck: Normal range of motion. Neck supple. No tracheal deviation present.  Cardiovascular: Normal rate, regular rhythm, S1 normal, S2 normal, normal heart sounds, intact distal pulses and normal pulses.   Pulmonary/Chest: Effort normal and breath sounds normal. No respiratory distress. She has no decreased breath sounds. She has no wheezes. She has no rhonchi. She has no rales.  Abdominal: Normal appearance and bowel sounds are normal. There is no tenderness.  Musculoskeletal: Normal range of motion.  Neurological: She is alert and oriented to person, place, and time.  Skin: Skin is warm and dry.  Psychiatric: She has a normal mood and affect. Her speech is normal and behavior is normal. Thought content normal.  Nursing note and vitals reviewed.    ED Treatments / Results  Labs (all labs ordered are listed, but only abnormal results are displayed) Labs Reviewed  CBC - Abnormal; Notable for the following:       Result Value   WBC 11.0 (*)    RBC 3.38 (*)    Hemoglobin 9.0 (*)    HCT 28.7 (*)    All other components within normal limits  BASIC METABOLIC PANEL - Abnormal; Notable for the following:    Chloride 99 (*)    Glucose, Bld 199 (*)    BUN 75 (*)    Creatinine, Ser 6.08 (*)    Calcium 7.9 (*)    GFR calc non Af Amer 7 (*)    GFR calc Af Amer 8 (*)    All other components within normal  limits    EKG  EKG Interpretation None       Radiology No results found.  Procedures Procedures (including critical care time)  Medications Ordered in ED Medications - No data to display   Initial Impression / Assessment and Plan / ED Course  I have reviewed the triage vital signs and the nursing notes.  Pertinent labs & imaging results that were available during my care of the patient were reviewed by me and considered in my medical decision making (see chart for details).  Final Clinical Impressions(s) / ED Diagnoses  {I have reviewed and evaluated the relevant laboratory values.  {I have interpreted the relevant EKG. {I have reviewed  the relevant previous healthcare records.  {I obtained HPI from historian. {Patient discussed with supervising physician.  ED Course:  Assessment: Pt is a 56 y.o. female  with a hx of HTN, DM, Chronic Diastolic CHF, CKD Stage III, presents to the Emergency Department today due to high blood pressure. Pt checked this morning and notes that it was "high." Took BP meds this morning around 0300. Pt took an extra 200mg  Labetolol PTA. Seen by Cardiology on 09-16-16. Pt had hydralazine increased to 25mg  BID. Pt currently takes Torsemide, Hydralazine, and Labetolol. Per Cards note, pt with possible rental artery stenosis. Pt denies symptoms currently. No N/V/D. No CP/SOB/ABD pain. No headaches or visual changes. No fevers. No neck stiffness. No back pain. On exam, pt in NAD. Nontoxic/nonseptic appearing. VS with BP 226/109. Afebrile. Lungs CTA. Heart RRR. Abdomen nontender soft. CBC unremarkable. BMP with AKI. Creatinine 6.08. Previous 4.95 x 3 weeks ago. Goal BP is 150-160. BP improved to 181/93 without intervention. Plan is to [place].   Disposition/Plan:  Admit Pt acknowledges and agrees with plan  Supervising Physician Charlesetta Shanks, MD  Final diagnoses:  AKI (acute kidney injury) [facility])  Malignant hypertension    New Prescriptions New  Prescriptions   No medications on file     Shary Decamp, Hershal Coria 10/11/17 [place], [place], MD 10/16/17 [phone]

## 2017-10-11 NOTE — Progress Notes (Signed)
Pamela Campbell is a 56 y.o. female patient admitted from ED awake, alert - oriented  X 4 - no acute distress noted.  VSS - Blood pressure (!) 183/86, pulse 69, temperature 98.2 F (36.8 C), temperature source Oral, resp. rate (!) 27, height 5\' 4"  (1.626 m), weight 125.8 kg (277 lb 4.8 oz), last menstrual period 02/02/2015, SpO2 98 %.    IV in place, occlusive dsg intact without redness.  Orientation to room, and floor completed with information packet given to patient/family.  Patient declined safety video at this time.  Admission INP armband ID verified with patient/family, and in place.   SR up x 2, fall assessment complete, with patient and family able to verbalize understanding of risk associated with falls, and verbalized understanding to call nsg before up out of bed.  Call light within reach, patient able to voice, and demonstrate understanding.  Skin, clean-dry- intact without evidence of bruising, or skin tears.   No evidence of skin break down noted on exam.     Will cont to eval and treat per MD orders.  Milas Hock, RN 10/11/2017 1:40 PM

## 2017-10-11 NOTE — ED Notes (Signed)
Admitting at bedside 

## 2017-10-12 DIAGNOSIS — I120 Hypertensive chronic kidney disease with stage 5 chronic kidney disease or end stage renal disease: Secondary | ICD-10-CM | POA: Diagnosis not present

## 2017-10-12 DIAGNOSIS — N185 Chronic kidney disease, stage 5: Secondary | ICD-10-CM

## 2017-10-12 DIAGNOSIS — I160 Hypertensive urgency: Secondary | ICD-10-CM | POA: Diagnosis not present

## 2017-10-12 DIAGNOSIS — Z888 Allergy status to other drugs, medicaments and biological substances status: Secondary | ICD-10-CM | POA: Diagnosis not present

## 2017-10-12 DIAGNOSIS — Z79899 Other long term (current) drug therapy: Secondary | ICD-10-CM | POA: Diagnosis not present

## 2017-10-12 LAB — COMPREHENSIVE METABOLIC PANEL
ALBUMIN: 2.6 g/dL — AB (ref 3.5–5.0)
ALT: 24 U/L (ref 14–54)
ANION GAP: 9 (ref 5–15)
AST: 19 U/L (ref 15–41)
Alkaline Phosphatase: 87 U/L (ref 38–126)
BUN: 79 mg/dL — ABNORMAL HIGH (ref 6–20)
CHLORIDE: 102 mmol/L (ref 101–111)
CO2: 25 mmol/L (ref 22–32)
Calcium: 8.4 mg/dL — ABNORMAL LOW (ref 8.9–10.3)
Creatinine, Ser: 6.59 mg/dL — ABNORMAL HIGH (ref 0.44–1.00)
GFR calc non Af Amer: 6 mL/min — ABNORMAL LOW (ref >60)
GFR, EST AFRICAN AMERICAN: 7 mL/min — AB (ref >60)
GLUCOSE: 126 mg/dL — AB (ref 65–99)
POTASSIUM: 4.3 mmol/L (ref 3.5–5.1)
SODIUM: 136 mmol/L (ref 135–145)
TOTAL PROTEIN: 6.1 g/dL — AB (ref 6.5–8.1)
Total Bilirubin: 0.7 mg/dL (ref 0.3–1.2)

## 2017-10-12 LAB — HIV ANTIBODY (ROUTINE TESTING W REFLEX): HIV SCREEN 4TH GENERATION: NONREACTIVE

## 2017-10-12 LAB — GLUCOSE, CAPILLARY
Glucose-Capillary: 111 mg/dL — ABNORMAL HIGH (ref 65–99)
Glucose-Capillary: 157 mg/dL — ABNORMAL HIGH (ref 65–99)

## 2017-10-12 MED ORDER — ONDANSETRON 4 MG PO TBDP
4.0000 mg | ORAL_TABLET | Freq: Four times a day (QID) | ORAL | Status: DC | PRN
  Filled 2017-10-12 (×2): qty 1

## 2017-10-12 MED ORDER — ONDANSETRON 4 MG PO TBDP: 4.0000 mg | ORAL_TABLET | Freq: Four times a day (QID) | ORAL | 1 refills | Status: DC | PRN

## 2017-10-12 NOTE — Plan of Care (Signed)
Problem: Education: Goal: Knowledge of [facility] Education information/materials will improve Outcome: Progressing POC reviewed with pt.   

## 2017-10-12 NOTE — Progress Notes (Signed)
  Subjective: Pt reports continued feelings of nausea and malaise after taking her medications. Denies any vomiting, headache, blurry vision, or dizziness. Patient adamantly expresses intent to take medications as prescribed regardless of nausea and vomiting that may follow. Excited about going home today and plans to resume work starting next Monday.  Objective: Vital signs in last 24 hours: Vitals:   10/12/17 0000 10/12/17 0334 10/12/17 0743 10/12/17 0834  BP: (!) 184/91 (!) 189/88 (!) 183/92 (!) 172/80  Pulse:  69 72 64  Resp: 18 16 19    Temp:  98.6 F (37 C) 97.9 F (36.6 C)   TempSrc:  Oral Oral   SpO2:  96% 97%   Weight:  124.5 kg (274 lb 8 oz)    Height:       Weight change: -0.488 kg (-1 lb 1.2 oz)  Intake/Output Summary (Last 24 hours) at 10/12/17 1042 Last data filed at 10/12/17 0919  Gross per 24 hour  Intake              440 ml  Output             2450 ml  Net            -2010 ml   Physical Exam: Constitutional: Laying in bed comfortably, No acute distress Head: Normocephalic, atraumatic  Cardiovascular: Regular rate and rhythm, no murmurs, rubs, or gallops.  Respiratory: normal effort, CTAB Abdominal: normal bowel sounds, non tender Neuro: alert and oriented x4   Medications: I have reviewed the patient's current medications. Scheduled Meds: . calcitRIOL  0.25 mcg Oral Daily  . enoxaparin (LOVENOX) injection  30 mg Subcutaneous Q24H  . hydrALAZINE  25 mg Oral BID  . insulin aspart  0-9 Units Subcutaneous TID WC  . labetalol  400 mg Oral BID  . pantoprazole  40 mg Oral Daily  . torsemide  40 mg Oral BID   Continuous Infusions: PRN Meds:.gi cocktail, ondansetron Assessment/Plan: Active Problems:   Hypertensive urgency  Hypertensive Urgency Bp has return to patients normal baseline which she reports a systolic in the 694-854'O.  Continue current home regiment  Labetalol 400mg  BID Patient's hydralzine was previously increased to 50mg  BID but pt was  noncompliant due to general malaise with the increased dosage. For now continue Hydralazine 25mg  BID  Torsemide 40mg  BID  CKD Stage V Calcitriol 0.25mcg daily Outpatient Nephrology follow up  Nausea Pantoprazole 40mg  prn  This is a Careers information officer Note.  The care of the patient was discussed with Dr. Tarri Abernethy and the assessment and plan formulated with their assistance.  Please see their attached note for official documentation of the daily encounter.   LOS: 0 days   Ronnette Juniper, Medical Student 10/12/2017, 10:42 AM

## 2017-10-12 NOTE — Progress Notes (Signed)
   Subjective: Doing well this AM. States that she took her BP medicine this AM but is nauseous now. This does occur at home but she says that she does not vomit. Requesting medication to help. Discussed that this is likely due to her severe kidney disease. Outpatient follow-up with a nephrologist is vital. She has an appointment with her PCP on Nov 8 and will hopefully get a referral to [facility]. She denies chest pain, SOB, HA, visual changes, or lightheadedness with position changes. Discussed the plan to discharge today with close outpatient follow-up. She agrees. All questions and concerns addressed.   Objective: Vital signs in last 24 hours: Vitals:   10/11/17 1633 10/11/17 2034 10/12/17 0000 10/12/17 0334  BP: (!) 161/73 (!) 175/89 (!) 184/91 (!) 189/88  Pulse: (!) 13 77  69  Resp: 20 18 18 16   Temp: 98.1 F (36.7 C) 99.1 F (37.3 C)  98.6 F (37 C)  TempSrc: Oral Oral  Oral  SpO2: 98% 96%  96%  Weight:    274 lb 8 oz (124.5 kg)  Height:       General: Obese female resting comfortably in bed  Pulm: Good air movement, no wheezing or crackles CV: RRR, no murmurs, no rubs  Abdomen: Active bowel sounds, soft, no tenderness to palpation  Extremities: Trace-Mild LE pitting edema with stockings on  Assessment/Plan:  1. Hypertensive Urgency  - Thought to be 2/2 medication noncompliance. UDS negative. Unable to find complete work-up for secondary HTN causes through chart review. - SBP initially >220, improved to ~180 with no acute intervention  - No acute end-organ damage  - Currently on Torsemide 40 mg BID, Hydralazine 25 mg BID, and Labetalol 400 mg BID  - History of hypotension with amlodipine however, given her renal disease CCB would be beneficial. Consider adding verapamil in the future.  - No medication changes but will give sublingual Zofran on discharge  - Follow-up with PCP and nephrology as outpatient   2. CKD Stage V - Seen by [facility] but requesting new  nephrologist  - Per Dr. Jimmy Footman her CKD is likely due to uncontrolled HTN however, given her significant proteinuria she may have had FSG or another glomerulonephropathy - No acute indications for HD at this time. Not volume overload, K 4.3, CO2 25, AG 9, may be having some symptoms of uremia (N/V, fatigue, anorxeia), Ca 9.6, Hgb 9.0  3. HFpEF - Echo in 10/2016 ilustrating EF 55-60% with G2DD - Continue Torsemide and Labetalol   4. Uncontrolled Type 2 DM  - Recent A1c 8.0 on 07/28/2017  - Does not take any medications as an outpatient  - SSI while in the hospital   Dispo: Anticipated discharge in approximately 0 day(s).   Ina Homes, MD 10/12/2017, 6:28 AM My Pager: [phone]

## 2017-10-12 NOTE — Discharge Instructions (Signed)
Thank you for allowing [place] to provide your care.   - Please continue to take all your medication as prescribed.   - If you find you blood pressure elevated try to take an extra pill of the Hydralazine as opposed to the Labetalol.   - Follow-up with your PCP on November 8th, and ensure you get a referral to a nephrologist.   - We have sent our a prescription for Ondansetron, you can take this prior to your blood pressure medicine to see if it helps with your nausea.

## 2017-10-12 NOTE — Discharge Summary (Signed)
Name: Pamela Campbell MRN: [id number] DOB: 02/10/1961 56 y.o. PCP: Arnoldo Morale, MD  Date of Admission: 10/11/2017  7:04 AM Date of Discharge: 10/12/2017 Attending Physician: Axel Filler, *  Discharge Diagnosis: 1. Hypertensive Urgency  2. CKD Stage V  Active Problems:   Hypertensive urgency  Discharge Medications: Allergies as of 10/12/2017      Reactions   Compazine [prochlorperazine Edisylate] Swelling   Prochlorperazine Swelling   Tongue swelling    Vioxx [rofecoxib] Other (See Comments)   Other reaction(s): Bleeding (intolerance) Bleeding      Medication List    TAKE these medications   calcitRIOL 0.25 MCG capsule Commonly known as:  ROCALTROL Take 0.25 capsules by mouth daily.   hydrALAZINE 25 MG tablet Commonly known as:  APRESOLINE Take 1 tablet (25 mg total) by mouth 2 (two) times daily.   labetalol 200 MG tablet Commonly known as:  NORMODYNE Take 400 mg by mouth 2 (two) times daily.   ondansetron 4 MG disintegrating tablet Commonly known as:  ZOFRAN-ODT Take 1 tablet (4 mg total) by mouth every 6 (six) hours as needed for nausea or vomiting.   RANITIDINE 150 MAX STRENGTH PO Take 150 tablets by mouth every 12 (twelve) hours as needed (nausea).   torsemide 20 MG tablet Commonly known as:  DEMADEX Take 40 mg by mouth 2 (two) times daily.      Disposition and follow-up:   Pamela Campbell was discharged from [facility] in Stable condition.  At the hospital follow up visit please address:  1.  Hypertension. Consider increasing her Hydralazine or starting her on a CCB (consider verapamil, amlodipine will make her proteinuria worse). Encourage weight loss. CKD. Ensure she follows up with nephrology. She is currently experiencing uremic symptoms and likely will need HD. Encourage weight loss and medication compliance, this may open the room to a transplant.   2.  Labs / imaging needed at time of follow-up: None  3.   Pending labs/ test needing follow-up: None  Follow-up Appointments: Follow-up Information    Arnoldo Morale, MD Follow up on 10/28/2017.   Specialty:  Family Medicine Contact information: [place] [address] [place] Hospital Course by problem list: Active Problems:   Hypertensive urgency   1. Hypertensive Urgency. Pamela Campbell is a 56 y.o. Female with a PMHx significant for CKD Stage V secondary to longstanding HTN who presented to the ED with symptomatic hypertension. She woke up feeling funny, took her BP and found her SBP to be >260. She took her BP medication and an extra dose of her labetalol prior to arrival in the ED. No acute interventions were done and her BP over the next 24 hours returned to below her baseline. She reports at home her SBP is typically between 180-190. She voices concerns about nausea when taking her BP medications and would like a medication to help. Discussed that we could try a medication however, this nausea may be related to her underlying kidney dysfunction and she needs to follow-up with a nephrologist as an outpatient. She is reluctant to change any of her BP medications at this point. We encouraged her to see her PCP (appointment Nov. 8th) and follow-up with a nephrologist. She was discharged in stable condition with a prescription for sublingual ondansetron. Discharge BP medicines are Hydralazine 25 mg BID, Torsemide 40 mg BID, and Labetalol 400 mg BID. Given her severe renal dysfunction a  calcium channel blocker or increasing her hydralazine may be the next best step.   2. CKD Stage V. Pamela Campbell's creatinine was elevated to 6 on admission. This is increased from her previous lab work at her cardiologist's office in September which showed a creatinine of 4. However, her GFR remained relatively unchanged. She did not have any severe electrolyte derangements, acidosis, or volume overload to warrant emergent HD. She does appear to  be experiencing uremic symptoms including nausea, anorexia, and fatigue. She was previously evaluated by [facility], Dr. Jimmy Footman who felt her CKD was likely due to uncontrolled HTN however, given her significant proteinuria she may have had FSG or another glomerulonephropathy. She is requesting to obtain another medical opinion. She states that she has a PCP appointment on November 8th at which point she will obtain a referral to a nephrologist at [facility]. We discussed that based on her clinical course she will likely need HD and strongly encouraged she see a nephrologist as soon as possible.   Discharge Vitals:   BP (!) 190/101 (BP Location: Left Wrist)   Pulse 69   Temp 98 F (36.7 C) (Oral)   Resp 15   Ht 5\' 4"  (1.626 m)   Wt 274 lb 8 oz (124.5 kg)   LMP 02/02/2015   SpO2 97%   BMI 47.12 kg/m   Discharge Instructions: Discharge Instructions    Diet - low sodium heart healthy    Complete by:  As directed    Increase activity slowly    Complete by:  As directed      Signed: Ina Homes, MD 10/12/2017, 11:51 AM   My Pager: [phone]

## 2017-10-12 NOTE — Progress Notes (Signed)
[name] to be D/C'd home per MD order.  Discussed with the patient and all questions fully answered.  VSS, Skin clean, dry and intact without evidence of skin break down, no evidence of skin tears noted. IV catheter discontinued intact. Site without signs and symptoms of complications. Dressing and pressure applied.  An After Visit Summary was printed and given to the patient. Patient received prescription.  D/c education completed with patient/family including follow up instructions, medication list, d/c activities limitations if indicated, with other d/c instructions as indicated by MD - patient able to verbalize understanding, all questions fully answered.   Patient instructed to return to ED, call 911, or call MD for any changes in condition.   Patient escorted via [place], and D/C home via private auto.  Milas Hock 10/12/2017 1:06 PM

## 2017-10-15 ENCOUNTER — Ambulatory Visit (INDEPENDENT_AMBULATORY_CARE_PROVIDER_SITE_OTHER): Payer: Managed Care, Other (non HMO) | Admitting: Physician Assistant

## 2017-10-15 ENCOUNTER — Encounter: Payer: Self-pay | Admitting: Physician Assistant

## 2017-10-15 VITALS — BP 210/92 | HR 67 | Ht 64.0 in | Wt 275.6 lb

## 2017-10-15 DIAGNOSIS — N289 Disorder of kidney and ureter, unspecified: Secondary | ICD-10-CM

## 2017-10-15 DIAGNOSIS — I5032 Chronic diastolic (congestive) heart failure: Secondary | ICD-10-CM | POA: Diagnosis not present

## 2017-10-15 DIAGNOSIS — E785 Hyperlipidemia, unspecified: Secondary | ICD-10-CM | POA: Diagnosis not present

## 2017-10-15 DIAGNOSIS — Z79899 Other long term (current) drug therapy: Secondary | ICD-10-CM

## 2017-10-15 DIAGNOSIS — N189 Chronic kidney disease, unspecified: Secondary | ICD-10-CM

## 2017-10-15 DIAGNOSIS — E119 Type 2 diabetes mellitus without complications: Secondary | ICD-10-CM | POA: Diagnosis not present

## 2017-10-15 DIAGNOSIS — I10 Essential (primary) hypertension: Secondary | ICD-10-CM | POA: Diagnosis not present

## 2017-10-15 MED ORDER — HYDRALAZINE HCL 50 MG PO TABS: 50.0000 mg | ORAL_TABLET | Freq: Three times a day (TID) | ORAL | 3 refills | Status: DC

## 2017-10-15 NOTE — Progress Notes (Signed)
Cardiology Office Note    Date:  10/17/2017   ID:  Pamela Campbell, DOB 1961-11-01, MRN [id number]  PCP:  Arnoldo Morale, MD  Cardiologist:  Referred by [facility] to Dr. Gwenlyn Found. Previously Dr. Debara Pickett (however does not appears to have ever been seen by Dr. Debara Pickett)   Chief Complaint  Patient presents with  . Follow-up    case discussed with Dr. Gwenlyn Found    History of Present Illness:  Pamela Campbell is a 56 y.o. female with PMH of chronic diastolic HF, HTN, HLD, type 2 DM, and stage III CKD. She has a history of noncompliance. She was initially evaluated at [facility] ED in November 2017 for chest pain. She was noted to have serum creatinine of 2.5. She reported noncompliance with her antihypertensive medication for the past one year. Serial troponin was borderline elevated. Echocardiogram showed normal LVEF with grade 2 DD. Myoview was abnormal, she was hydrated for a few days until her creatinine improved enough to proceed with diagnostic cardiac catheterization. Cardiac catheterization fortunately only showed minor CAD. Her last cardiology visit was in November 2017 for follow-up, her Lasix was reduced to 40 mg daily at the time. More recently, she was admitted to [facility] with acute on chronic renal insufficiency. She presented with 2 weeks worth of progressively worsening shortness of breath and was admitted for hypertensive emergency with blood pressure of 220. Creatinine was 5 on arrival. She also had pulmonary edema and bilateral lower extremity edema. She was started on the [place] in the ED, later transitioned to the BiPAP due to increased work of breathing, somnolence and tachypnea. Initially, effort was made to reinitiate carvedilol and add IV Nitro to control the blood pressure without success. She was admitted to ICU and started on Clevidipine drip. BNP was 4068. White blood cell count 23.4. She was diuresed. Later she was transitioned to oral amlodipine, labetalol and hydralazine, however this  dropped her blood pressure too much and it worsened her headache. She was diuresed with torsemide 40 mg twice a day and was net -10 L at discharge. TTE obtained during the admission showed normal EF. She was eventually discharged on labetalol 400 mg twice a day along with torsemide. She is also on sevelamer and was told to follow-up with nephrology team upon returning to [place] area. Hemoglobin A1c obtained during this admission was 8.0.   I have seen her twice so far. I increased her hydralazine to 25 mg twice a day. She has been followed by Dr. Jimmy Footman and later Dr. Florene Glen. During the last office visit with Dr. Florene Glen, her torsemide was increased to 100 mg twice a day and hydralazine increased to 50 mg 3 times a day. She felt very poorly after taking the new medication and has since reverted back to her previous medication. She also went to the hospital on 10/11/2017 with uncontrolled hypertension. She was eventually discharged on her home blood pressure medications. I do agree with hydralazine dosage, today I have restarted her on 50 mg 3 times a day dosing as recommended by Dr. Florene Glen. Recent labs shows her creatinine is trending up to greater than 6. I will hold off on increasing the torsemide and the keep at 40 mg twice a day. I urged her to follow-up with her nephrologist, however she appears to want a second opinion at [facility]. I fear she is becoming uremic and this continued to result in uncontrolled hypertension. She likely will need AV fistula placement and hemodialysis at some  point. However she does not want to consider this at this time. We discussed signs and symptoms of volume overload. Given the recent deterioration, I have discussed her case with Dr. Gwenlyn Found to make him aware as well.   Past Medical History:  Diagnosis Date  . Chronic diastolic CHF (congestive heart failure) ([place])    a. 03/2015: echo w/ EF of 50-55%, no WMA, Grade 2 DD, trivial AI, mild MR.  . Diabetes mellitus without  complication ([place])   . Hypertension     Past Surgical History:  Procedure Laterality Date  . CARDIAC CATHETERIZATION N/A 11/02/2016   Procedure: Left Heart Cath and Coronary Angiography;  Surgeon: Sherren Mocha, MD;  Location: [facility];  Service: Cardiovascular;  Laterality: N/A;  . CESAREAN SECTION    . FRACTURE SURGERY    . TUBAL LIGATION      Current Medications: Outpatient Medications Prior to Visit  Medication Sig Dispense Refill  . calcitRIOL (ROCALTROL) 0.25 MCG capsule Take 0.25 capsules by mouth daily.  0  . labetalol (NORMODYNE) 200 MG tablet Take 400 mg by mouth 2 (two) times daily.    . ondansetron (ZOFRAN-ODT) 4 MG disintegrating tablet Take 1 tablet (4 mg total) by mouth every 6 (six) hours as needed for nausea or vomiting. 30 tablet 1  . torsemide (DEMADEX) 20 MG tablet Take 40 mg by mouth 2 (two) times daily.   1  . hydrALAZINE (APRESOLINE) 25 MG tablet Take 1 tablet (25 mg total) by mouth 2 (two) times daily. 60 tablet 3  . RaNITidine HCl (RANITIDINE 150 MAX STRENGTH PO) Take 150 tablets by mouth every 12 (twelve) hours as needed (nausea).     No facility-administered medications prior to visit.      Allergies:   Compazine [prochlorperazine edisylate]; Prochlorperazine; and Vioxx [rofecoxib]   Social History   Social History  . Marital status: Married    Spouse name: N/A  . Number of children: N/A  . Years of education: N/A   Social History Main Topics  . Smoking status: Former Smoker    Quit date: 03/22/1995  . Smokeless tobacco: Never Used  . Alcohol use No  . Drug use: No  . Sexual activity: No   Other Topics Concern  . None   Social History Narrative  . None     Family History:  The patient's family history includes Congestive Heart Failure in her brother and son; Diabetes Mellitus II in her mother; Heart attack (age of onset: 59) in her father; Heart disease in her father; Hyperlipidemia in her mother; Hypertension in her mother;  Kidney disease in her mother.   ROS:   Please see the history of present illness.    ROS All other systems reviewed and are negative.   PHYSICAL EXAM:   VS:  BP (!) 210/92   Pulse 67   Ht 5\' 4"  (1.626 m)   Wt 275 lb 9.6 oz (125 kg)   LMP 02/02/2015   BMI 47.31 kg/m    GEN: Well nourished, well developed, in no acute distress  HEENT: normal  Neck: no JVD, carotid bruits, or masses Cardiac: RRR; no murmurs, rubs, or gallops. Bilateral lower edema, lower extremity skin turgor very tight  Respiratory:  clear to auscultation bilaterally, normal work of breathing GI: soft, nontender, nondistended, + BS MS: no deformity or atrophy  Skin: warm and dry, no rash Neuro:  Alert and Oriented x 3, Strength and sensation are intact Psych: euthymic mood, full affect  Wt Readings from Last 3 Encounters:  10/15/17 275 lb 9.6 oz (125 kg)  10/12/17 274 lb 8 oz (124.5 kg)  09/16/17 272 lb 9.6 oz (123.7 kg)      Studies/Labs Reviewed:   EKG:  EKG is not ordered today.    Recent Labs: 10/24/2016: B Natriuretic Peptide 144.9; TSH 2.330 10/11/2017: Hemoglobin 9.0; Platelets 255 10/12/2017: ALT 24; BUN 79; Creatinine, Ser 6.59; Potassium 4.3; Sodium 136   Lipid Panel    Component Value Date/Time   CHOL 323 (H) 10/24/2016 1822   TRIG 236 (H) 10/24/2016 1822   HDL 70 10/24/2016 1822   CHOLHDL 4.6 10/24/2016 1822   VLDL 47 (H) 10/24/2016 1822   LDLCALC 206 (H) 10/24/2016 1822    Additional studies/ records that were reviewed today include:   Echo 07/30/2017 NORMAL LEFT VENTRICULAR SYSTOLIC FUNCTION WITH MILD LVH NORMAL RIGHT VENTRICULAR SYSTOLIC FUNCTION VALVULAR REGURGITATION: TRIVIAL AR, TRIVIAL MR, TRIVIAL PR, TRIVIAL TR NO VALVULAR STENOSIS Essentially negative echocardiogram other than left ventricular hypertrophy. NO PRIOR STUDY FOR COMPARISON   Renal US 07/30/2017 Findings: Markedly limited exam secondary to body habitus. The right kidney measures 10.0 cm.  There is no hydronephrosis.  The left kidney measures 10.8 cm. There is no hydronephrosis.  The urinary bladder is partially distended and unremarkable.  Impression: Markedly limited exam of the kidneys with no evidence of hydronephrosis.   ASSESSMENT:    1. Malignant hypertension   2. Medication management   3. Chronic diastolic heart failure ([place])   4. Essential hypertension   5. Hyperlipidemia, unspecified hyperlipidemia type   6. Controlled type 2 diabetes mellitus without complication, without long-term current use of insulin ([place])   7. Acute on chronic renal insufficiency      PLAN:  In order of problems listed above:  1. Malignant hypertension: We'll increase hydralazine to 50 mg TID as recommended by Dr. Florene Glen. Initial blood pressure goal for her is 150-160. Hesitant to drop her blood pressure too aggressively given history of worsening renal function with drop in the blood pressure.  2. Chronic diastolic heart failure: Her lung is clear, she does have lower extremity edema. Her torsemide was recently increased to 100 mg twice a day, she felt very poorly after taking the first dose. I will continue her on the 40 mg twice a day dosage for now  3. Acute on chronic renal insufficiency: Last creatinine trended up to 6. She will need close outpatient with visitation with her nephrologist. Unfortunately she has some degree of trust issues with medical provider. I did discuss with her signs and the symptom of volume accumulation. She likely will progress to end-stage renal disease soon.  4. Hypertension: Blood pressure very high today, will increase hydralazine to 50 mg 3 times today. Initial blood pressure goal was 150-160  5. Hyperlipidemia: Will need to be managed by primary care provider  6. DM 2: Will defer to primary care provider. She is establishing with [facility] physicians.    Medication Adjustments/Labs and Tests Ordered: Current medicines are reviewed at length with  the patient today.  Concerns regarding medicines are outlined above.  Medication changes, Labs and Tests ordered today are listed in the Patient Instructions below. Patient Instructions  Medication Instructions: Almyra Deforest, PA-C has recommended making the following medication changes: 1. INCREASE Hydralazine to 50 mg THREE times daily  Labwork: Your physician recommends that you return for lab work on [place] or [place] of next week.  Testing/Procedures: NONE ORDERED  Follow-up: Isaac Laud recommends that  you schedule a follow-up appointment in 1 month. Please keep your appointment with Dr Gwenlyn Found in December.  If you need a refill on your cardiac medications before your next appointment, please call your pharmacy.    Hilbert Corrigan, [place]  10/17/2017 9:27 AM    [address], [place], [place]  [address] Phone: [phone]; Fax: [phone]

## 2017-10-15 NOTE — Patient Instructions (Signed)
Medication Instructions: Almyra Deforest, PA-C has recommended making the following medication changes: 1. INCREASE Hydralazine to 50 mg THREE times daily  Labwork: Your physician recommends that you return for lab work on [place] or [place] of next week.  Testing/Procedures: NONE ORDERED  Follow-up: Isaac Laud recommends that you schedule a follow-up appointment in 1 month. Please keep your appointment with Dr Gwenlyn Found in December.  If you need a refill on your cardiac medications before your next appointment, please call your pharmacy.

## 2017-10-17 ENCOUNTER — Encounter: Payer: Self-pay | Admitting: Physician Assistant

## 2017-10-20 LAB — BASIC METABOLIC PANEL
BUN / CREAT RATIO: 11 (ref 9–23)
BUN: 68 mg/dL — ABNORMAL HIGH (ref 6–24)
CHLORIDE: 100 mmol/L (ref 96–106)
CO2: 24 mmol/L (ref 20–29)
Calcium: 8.3 mg/dL — ABNORMAL LOW (ref 8.7–10.2)
Creatinine, Ser: 6.39 mg/dL — ABNORMAL HIGH (ref 0.57–1.00)
GFR calc non Af Amer: 7 mL/min/{1.73_m2} — ABNORMAL LOW (ref >59)
GFR, EST AFRICAN AMERICAN: 8 mL/min/{1.73_m2} — AB (ref >59)
Glucose: 148 mg/dL — ABNORMAL HIGH (ref 65–99)
POTASSIUM: 4.3 mmol/L (ref 3.5–5.2)
Sodium: 141 mmol/L (ref 134–144)

## 2017-10-21 NOTE — Progress Notes (Signed)
Cr changed from 6.59 down to 6.39, minimal improvement, but still very high. Need nephrologist to manage. Potassium level normal.

## 2017-11-17 ENCOUNTER — Ambulatory Visit: Payer: Managed Care, Other (non HMO) | Admitting: Physician Assistant

## 2017-11-17 NOTE — Progress Notes (Deleted)
Cardiology Office Note    Date:  11/17/2017   ID:  Pamela Campbell, DOB [birth year], MRN [id number]  PCP:  Arnoldo Morale, MD  Cardiologist:  Referred by [facility] to Dr. Gwenlyn Found. Previously Dr. Debara Pickett (however does not appears to have ever been seen by Dr. Debara Pickett)     No chief complaint on file.   History of Present Illness:  Pamela Campbell is a 56 y.o. female with PMH of chronic diastolic HF, HTN, HLD, type 2 DM, and stage III CKD. She has a history of noncompliance. She was initially evaluated at [facility] ED in November 2017 for chest pain. She was noted to have serum creatinine of 2.5. She reported noncompliance with her antihypertensive medication for the past one year. Serial troponin was borderline elevated. Echocardiogram showed normal LVEF with grade 2 DD. Myoview was abnormal, she was hydrated for a few days until her creatinine improved enough to proceed with diagnostic cardiac catheterization. Cardiac catheterization fortunately only showed minor CAD. Her last cardiology visit was in November 2017 for follow-up, her Lasix was reduced to 40 mg daily at the time. More recently, she was admitted to [facility] with acute on chronic renal insufficiency. She presented with 2 weeks worth of progressively worsening shortness of breath and was admitted for hypertensive emergency with blood pressure of 220. Creatinine was 5 on arrival. She also had pulmonary edema and bilateral lower extremity edema. She was started on the [place] in the ED, later transitioned to the BiPAP due to increased work of breathing, somnolence and tachypnea. Initially, effort was made to reinitiate carvedilol and add IV Nitro to control the blood pressure without success. She was admitted to ICU and started on Clevidipine drip. BNP was 4068. White blood cell count 23.4. She was diuresed. Later she was transitioned to oral amlodipine, labetalol and hydralazine, however this dropped her blood pressure too much and it worsened her  headache. She was diuresed with torsemide 40 mg twice a day and was net -10 L at discharge. TTE obtained during the admission showed normal EF. She was eventually discharged on labetalol 400 mg twice a day along with torsemide. She is also on sevelamer and was told to follow-up with nephrology team upon returning to [place] area. Hemoglobin A1c obtained during this admission was 8.0.   No EKG    Past Medical History:  Diagnosis Date  . Chronic diastolic CHF (congestive heart failure) ([place])    a. 03/2015: echo w/ EF of 50-55%, no WMA, Grade 2 DD, trivial AI, mild MR.  . Diabetes mellitus without complication ([place])   . Hypertension     Past Surgical History:  Procedure Laterality Date  . CARDIAC CATHETERIZATION N/A 11/02/2016   Procedure: Left Heart Cath and Coronary Angiography;  Surgeon: Sherren Mocha, MD;  Location: [place] CV LAB;  Service: Cardiovascular;  Laterality: N/A;  . CESAREAN SECTION    . FRACTURE SURGERY    . TUBAL LIGATION      Current Medications: Outpatient Medications Prior to Visit  Medication Sig Dispense Refill  . calcitRIOL (ROCALTROL) 0.25 MCG capsule Take 0.25 capsules by mouth daily.  0  . hydrALAZINE (APRESOLINE) 50 MG tablet Take 1 tablet (50 mg total) by mouth 3 (three) times daily. 270 tablet 3  . labetalol (NORMODYNE) 200 MG tablet Take 400 mg by mouth 2 (two) times daily.    . ondansetron (ZOFRAN-ODT) 4 MG disintegrating tablet Take 1 tablet (4 mg total) by mouth every 6 (six) hours as  needed for nausea or vomiting. 30 tablet 1  . torsemide (DEMADEX) 20 MG tablet Take 40 mg by mouth 2 (two) times daily.   1   No facility-administered medications prior to visit.      Allergies:   Compazine [prochlorperazine edisylate]; Prochlorperazine; and Vioxx [rofecoxib]   Social History   Socioeconomic History  . Marital status: Married    Spouse name: Not on file  . Number of children: Not on file  . Years of education: Not on file  . Highest  education level: Not on file  Social Needs  . Financial resource strain: Not on file  . Food insecurity - worry: Not on file  . Food insecurity - inability: Not on file  . Transportation needs - medical: Not on file  . Transportation needs - non-medical: Not on file  Occupational History  . Not on file  Tobacco Use  . Smoking status: Former Smoker    Last attempt to quit: 03/22/1995    Years since quitting: 22.6  . Smokeless tobacco: Never Used  Substance and Sexual Activity  . Alcohol use: No  . Drug use: No  . Sexual activity: No    Birth control/protection: None  Other Topics Concern  . Not on file  Social History Narrative  . Not on file     Family History:  The patient's ***family history includes Congestive Heart Failure in her brother and son; Diabetes Mellitus II in her mother; Heart attack (age of onset: 47) in her father; Heart disease in her father; Hyperlipidemia in her mother; Hypertension in her mother; Kidney disease in her mother.   ROS:   Please see the history of present illness.    ROS All other systems reviewed and are negative.   PHYSICAL EXAM:   VS:  LMP 02/02/2015    GEN: Well nourished, well developed, in no acute distress  HEENT: normal  Neck: no JVD, carotid bruits, or masses Cardiac: ***RRR; no murmurs, rubs, or gallops,no edema  Respiratory:  clear to auscultation bilaterally, normal work of breathing GI: soft, nontender, nondistended, + BS MS: no deformity or atrophy  Skin: warm and dry, no rash Neuro:  Alert and Oriented x 3, Strength and sensation are intact Psych: euthymic mood, full affect  Wt Readings from Last 3 Encounters:  10/15/17 275 lb 9.6 oz (125 kg)  10/12/17 274 lb 8 oz (124.5 kg)  09/16/17 272 lb 9.6 oz (123.7 kg)      Studies/Labs Reviewed:   EKG:  EKG is*** ordered today.  The ekg ordered today demonstrates ***  Recent Labs: 10/11/2017: Hemoglobin 9.0; Platelets 255 10/12/2017: ALT 24 10/19/2017: BUN 68;  Creatinine, Ser 6.39; Potassium 4.3; Sodium 141   Lipid Panel    Component Value Date/Time   CHOL 323 (H) 10/24/2016 1822   TRIG 236 (H) 10/24/2016 1822   HDL 70 10/24/2016 1822   CHOLHDL 4.6 10/24/2016 1822   VLDL 47 (H) 10/24/2016 1822   LDLCALC 206 (H) 10/24/2016 1822    Additional studies/ records that were reviewed today include:  ***    ASSESSMENT:    No diagnosis found.   PLAN:  In order of problems listed above:  1. ***    Medication Adjustments/Labs and Tests Ordered: Current medicines are reviewed at length with the patient today.  Concerns regarding medicines are outlined above.  Medication changes, Labs and Tests ordered today are listed in the Patient Instructions below. There are no Patient Instructions on file for this visit.  Hilbert Corrigan, [place]  11/17/2017 9:16 AM    [address], [place], [place]  [address] Phone: [phone]; Fax: [phone]

## 2017-12-08 ENCOUNTER — Inpatient Hospital Stay (HOSPITAL_COMMUNITY)
Admission: EM | Admit: 2017-12-08 | Discharge: 2017-12-10 | DRG: 304 | Disposition: A | Discharge status: Home / Self Care | Payer: Managed Care, Other (non HMO) | Attending: Pulmonary Disease | Admitting: Pulmonary Disease

## 2017-12-08 ENCOUNTER — Encounter (HOSPITAL_COMMUNITY): Payer: Self-pay | Admitting: Emergency Medicine

## 2017-12-08 ENCOUNTER — Emergency Department (HOSPITAL_COMMUNITY): Payer: Managed Care, Other (non HMO)

## 2017-12-08 ENCOUNTER — Ambulatory Visit (INDEPENDENT_AMBULATORY_CARE_PROVIDER_SITE_OTHER)
Admission: EM | Admit: 2017-12-08 | Discharge: 2017-12-08 | Disposition: A | Discharge status: Home / Self Care | Payer: Managed Care, Other (non HMO) | Source: Home / Self Care | Attending: Family Medicine | Admitting: Family Medicine

## 2017-12-08 DIAGNOSIS — I132 Hypertensive heart and chronic kidney disease with heart failure and with stage 5 chronic kidney disease, or end stage renal disease: Secondary | ICD-10-CM | POA: Diagnosis present

## 2017-12-08 DIAGNOSIS — Z87891 Personal history of nicotine dependence: Secondary | ICD-10-CM | POA: Diagnosis not present

## 2017-12-08 DIAGNOSIS — Z833 Family history of diabetes mellitus: Secondary | ICD-10-CM

## 2017-12-08 DIAGNOSIS — E785 Hyperlipidemia, unspecified: Secondary | ICD-10-CM | POA: Diagnosis present

## 2017-12-08 DIAGNOSIS — T465X6A Underdosing of other antihypertensive drugs, initial encounter: Secondary | ICD-10-CM | POA: Diagnosis present

## 2017-12-08 DIAGNOSIS — G4730 Sleep apnea, unspecified: Secondary | ICD-10-CM | POA: Diagnosis present

## 2017-12-08 DIAGNOSIS — I10 Essential (primary) hypertension: Secondary | ICD-10-CM

## 2017-12-08 DIAGNOSIS — R062 Wheezing: Secondary | ICD-10-CM

## 2017-12-08 DIAGNOSIS — Z886 Allergy status to analgesic agent status: Secondary | ICD-10-CM | POA: Diagnosis not present

## 2017-12-08 DIAGNOSIS — R0603 Acute respiratory distress: Secondary | ICD-10-CM

## 2017-12-08 DIAGNOSIS — T501X6A Underdosing of loop [high-ceiling] diuretics, initial encounter: Secondary | ICD-10-CM | POA: Diagnosis present

## 2017-12-08 DIAGNOSIS — N183 Chronic kidney disease, stage 3 (moderate): Secondary | ICD-10-CM | POA: Diagnosis present

## 2017-12-08 DIAGNOSIS — R0602 Shortness of breath: Secondary | ICD-10-CM | POA: Diagnosis present

## 2017-12-08 DIAGNOSIS — I2510 Atherosclerotic heart disease of native coronary artery without angina pectoris: Secondary | ICD-10-CM | POA: Diagnosis present

## 2017-12-08 DIAGNOSIS — N184 Chronic kidney disease, stage 4 (severe): Secondary | ICD-10-CM | POA: Diagnosis not present

## 2017-12-08 DIAGNOSIS — J9601 Acute respiratory failure with hypoxia: Secondary | ICD-10-CM | POA: Diagnosis present

## 2017-12-08 DIAGNOSIS — R252 Cramp and spasm: Secondary | ICD-10-CM | POA: Diagnosis present

## 2017-12-08 DIAGNOSIS — R112 Nausea with vomiting, unspecified: Secondary | ICD-10-CM | POA: Diagnosis present

## 2017-12-08 DIAGNOSIS — E1122 Type 2 diabetes mellitus with diabetic chronic kidney disease: Secondary | ICD-10-CM | POA: Diagnosis present

## 2017-12-08 DIAGNOSIS — I5033 Acute on chronic diastolic (congestive) heart failure: Secondary | ICD-10-CM | POA: Diagnosis not present

## 2017-12-08 DIAGNOSIS — R0682 Tachypnea, not elsewhere classified: Secondary | ICD-10-CM

## 2017-12-08 DIAGNOSIS — Z6841 Body Mass Index (BMI) 40.0 and over, adult: Secondary | ICD-10-CM | POA: Diagnosis not present

## 2017-12-08 DIAGNOSIS — Z91128 Patient's intentional underdosing of medication regimen for other reason: Secondary | ICD-10-CM

## 2017-12-08 DIAGNOSIS — I5043 Acute on chronic combined systolic (congestive) and diastolic (congestive) heart failure: Secondary | ICD-10-CM | POA: Diagnosis present

## 2017-12-08 DIAGNOSIS — Z888 Allergy status to other drugs, medicaments and biological substances status: Secondary | ICD-10-CM

## 2017-12-08 DIAGNOSIS — I160 Hypertensive urgency: Secondary | ICD-10-CM | POA: Diagnosis present

## 2017-12-08 DIAGNOSIS — Z8249 Family history of ischemic heart disease and other diseases of the circulatory system: Secondary | ICD-10-CM

## 2017-12-08 DIAGNOSIS — I161 Hypertensive emergency: Secondary | ICD-10-CM | POA: Diagnosis present

## 2017-12-08 LAB — COMPREHENSIVE METABOLIC PANEL
ALK PHOS: 96 U/L (ref 38–126)
ALT: 37 U/L (ref 14–54)
AST: 28 U/L (ref 15–41)
Albumin: 3.1 g/dL — ABNORMAL LOW (ref 3.5–5.0)
Anion gap: 10 (ref 5–15)
BUN: 66 mg/dL — AB (ref 6–20)
CALCIUM: 8.2 mg/dL — AB (ref 8.9–10.3)
CO2: 21 mmol/L — AB (ref 22–32)
CREATININE: 6.93 mg/dL — AB (ref 0.44–1.00)
Chloride: 108 mmol/L (ref 101–111)
GFR, EST AFRICAN AMERICAN: 7 mL/min — AB (ref >60)
GFR, EST NON AFRICAN AMERICAN: 6 mL/min — AB (ref >60)
Glucose, Bld: 131 mg/dL — ABNORMAL HIGH (ref 65–99)
Potassium: 4.2 mmol/L (ref 3.5–5.1)
SODIUM: 139 mmol/L (ref 135–145)
Total Bilirubin: 0.7 mg/dL (ref 0.3–1.2)
Total Protein: 7.1 g/dL (ref 6.5–8.1)

## 2017-12-08 LAB — CBC WITH DIFFERENTIAL/PLATELET
BASOS ABS: 0 10*3/uL (ref 0.0–0.1)
Basophils Relative: 0 %
EOS PCT: 2 %
Eosinophils Absolute: 0.2 10*3/uL (ref 0.0–0.7)
HCT: 29.9 % — ABNORMAL LOW (ref 36.0–46.0)
Hemoglobin: 9.2 g/dL — ABNORMAL LOW (ref 12.0–15.0)
LYMPHS ABS: 2 10*3/uL (ref 0.7–4.0)
LYMPHS PCT: 17 %
MCH: 26.3 pg (ref 26.0–34.0)
MCHC: 30.8 g/dL (ref 30.0–36.0)
MCV: 85.4 fL (ref 78.0–100.0)
MONO ABS: 0.6 10*3/uL (ref 0.1–1.0)
MONOS PCT: 5 %
Neutro Abs: 9 10*3/uL — ABNORMAL HIGH (ref 1.7–7.7)
Neutrophils Relative %: 76 %
PLATELETS: 262 10*3/uL (ref 150–400)
RBC: 3.5 MIL/uL — ABNORMAL LOW (ref 3.87–5.11)
RDW: 15.9 % — AB (ref 11.5–15.5)
WBC: 11.8 10*3/uL — ABNORMAL HIGH (ref 4.0–10.5)

## 2017-12-08 LAB — I-STAT TROPONIN, ED: Troponin i, poc: 0.02 ng/mL (ref 0.00–0.08)

## 2017-12-08 LAB — BRAIN NATRIURETIC PEPTIDE: B NATRIURETIC PEPTIDE 5: 563.5 pg/mL — AB (ref 0.0–100.0)

## 2017-12-08 LAB — LIPASE, BLOOD: Lipase: 21 U/L (ref 11–51)

## 2017-12-08 MED ORDER — NICARDIPINE HCL IN NACL 20-0.86 MG/200ML-% IV SOLN: 3.0000 mg/h | INTRAVENOUS | Status: DC

## 2017-12-08 MED ORDER — IPRATROPIUM-ALBUTEROL 0.5-2.5 (3) MG/3ML IN SOLN
3.0000 mL | Freq: Once | RESPIRATORY_TRACT | Status: AC
  Administered 2017-12-08: 3 mL via RESPIRATORY_TRACT

## 2017-12-08 MED ORDER — NICARDIPINE HCL IN NACL 20-0.86 MG/200ML-% IV SOLN
3.0000 mg/h | Freq: Once | INTRAVENOUS | Status: AC
  Administered 2017-12-08: 10 mg/h via INTRAVENOUS
  Filled 2017-12-08: qty 200

## 2017-12-08 MED ORDER — SODIUM CHLORIDE 0.9 % IV SOLN: INTRAVENOUS | Status: DC | PRN

## 2017-12-08 MED ORDER — SODIUM CHLORIDE 0.9 % IV SOLN: 250.0000 mL | INTRAVENOUS | Status: DC | PRN

## 2017-12-08 MED ORDER — IPRATROPIUM BROMIDE 0.02 % IN SOLN: 0.5000 mg | RESPIRATORY_TRACT | Status: DC | PRN

## 2017-12-08 MED ORDER — NICARDIPINE HCL IN NACL 20-0.86 MG/200ML-% IV SOLN
3.0000 mg/h | Freq: Once | INTRAVENOUS | Status: AC
  Administered 2017-12-08: 5 mg/h via INTRAVENOUS
  Filled 2017-12-08: qty 200

## 2017-12-08 MED ORDER — ALBUTEROL SULFATE (2.5 MG/3ML) 0.083% IN NEBU
5.0000 mg | INHALATION_SOLUTION | Freq: Once | RESPIRATORY_TRACT | Status: DC
  Filled 2017-12-08: qty 6

## 2017-12-08 MED ORDER — HEPARIN SODIUM (PORCINE) 5000 UNIT/ML IJ SOLN
5000.0000 [IU] | Freq: Three times a day (TID) | INTRAMUSCULAR | Status: DC
  Administered 2017-12-09 – 2017-12-10 (×5): 5000 [IU] via SUBCUTANEOUS
  Filled 2017-12-08 (×7): qty 1

## 2017-12-08 NOTE — ED Triage Notes (Signed)
Pt given duoneb. Per Amy, call [facility] for transport to ER.

## 2017-12-08 NOTE — ED Provider Notes (Signed)
Patient arrived to urgent care with audible wheezing, accessory muscle use in acute respiratory distress. She was unable to complete full sentences. Lungs with diffuse wheezing and decreased air movement. She was found to be tachypneic, hypertensive at 264/119, borderline hypoxic at 93-94%. Duoneb started and EMS was called to be transferred to ED for further evaluation.   Patient with improved symptoms after duoneb treatment. Lungs with better air movement but continued wheezing and lower base crackles. Stable to be transferred to ED for further evaluation.  Case discussed with Dr Mannie Stabile, who agrees to plan.    Ok Edwards, PA-C 12/08/17 2022

## 2017-12-08 NOTE — ED Provider Notes (Signed)
[facility] EMERGENCY DEPARTMENT Provider Note   CSN: [id number] Arrival date & time: 12/08/17  1826     History   Chief Complaint Chief Complaint  Patient presents with  . Shortness of Breath    HPI Pamela Campbell is a 56 y.o. female.  Pamela Campbell is a 56 y.o. Female who presents to the emergency department complaining of shortness of breath with gradual onset this afternoon.  Patient reports gradual onset of shortness of breath this afternoon without associated chest pain.  She reports she is feeling better now and earlier she felt like she could not take a deep breath.  She reports she felt like she was breathing shallow.  She also reports that she is not been able to keep down her medications due to vomiting.  She tells me for the past more than 3 months she has been having vomiting after taking her medications.  She also reports sometimes she vomits after eating food.  She has had grapefruit to eat today with out vomiting.  She reports she tried taking her medications today and vomited.  She has not been able to take her blood pressure medications.  She denies having any abdominal pain.  No treatments attempted prior to arrival today.  She reports minimal leg swelling that is improved from her baseline.  She reports she is been having an associated cough for the past week.  She denies fevers, palpitations, abdominal pain, hematemesis, diarrhea, urinary symptoms, rashes, headache, syncope or changes to her vision.   The history is provided by the patient and medical records. No language interpreter was used.  Shortness of Breath  Associated symptoms include cough. Pertinent negatives include no fever, no headaches, no sore throat, no neck pain, no wheezing, no chest pain, no vomiting, no abdominal pain and no rash.    Past Medical History:  Diagnosis Date  . Chronic diastolic CHF (congestive heart failure) ([place])    a. 03/2015: echo w/ EF of 50-55%, no WMA,  Grade 2 DD, trivial AI, mild MR.  . Diabetes mellitus without complication ([place])   . Hypertension     Patient Active Problem List   Diagnosis Date Noted  . Hypertensive urgency 10/11/2017  . Hepatitis C antibody test positive 12/08/2016  . Minor CAD    . Abnormal stress test   . Essential hypertension   . Acute kidney injury ([place])   . Unstable angina pectoris ([place])   . Hypertensive emergency 10/24/2016  . Chest pain with high risk for cardiac etiology 10/24/2016  . Acute-on-chronic kidney injury ([place]) 10/24/2016  . CKD (chronic kidney disease) stage 3, GFR 30-59 ml/min ([facility]) 07/17/2015  . HLD (hyperlipidemia) 07/17/2015  . Type 2 diabetes mellitus ([place]) 03/27/2015  . Chronic diastolic congestive heart failure ([place]) 03/25/2015  . Accelerated hypertension 03/25/2015  . Obesity BMI 45 03/25/2015  . Sleep apnea 03/25/2015  . Dyslipidemia 03/25/2015  . Elevated troponin-demand ischemia 03/24/2015  . Hypertensive heart disease 03/24/2015  . Shortness of breath 03/24/2015    Past Surgical History:  Procedure Laterality Date  . CARDIAC CATHETERIZATION N/A 11/02/2016   Procedure: Left Heart Cath and Coronary Angiography;  Surgeon: Sherren Mocha, MD;  Location: [facility] CV LAB;  Service: Cardiovascular;  Laterality: N/A;  . CESAREAN SECTION    . FRACTURE SURGERY    . TUBAL LIGATION      OB History    No data available       Home Medications    Prior  to Admission medications   Medication Sig Start Date End Date Taking? Authorizing Provider  calcitRIOL (ROCALTROL) 0.25 MCG capsule Take 0.25 capsules by mouth daily. 09/13/17  Yes [provider]  Dextromethorphan-Guaifenesin (CORICIDIN HBP CONGESTION/COUGH) 10-200 MG CAPS Take 2 tablets by mouth daily as needed.   Yes [provider]  hydrALAZINE (APRESOLINE) 50 MG tablet Take 1 tablet (50 mg total) by mouth 3 (three) times daily. 10/15/17  Yes Almyra Deforest, PA  labetalol (NORMODYNE) 200 MG tablet Take 400 mg  by mouth 2 (two) times daily.   Yes [provider]  ondansetron (ZOFRAN-ODT) 4 MG disintegrating tablet Take 1 tablet (4 mg total) by mouth every 6 (six) hours as needed for nausea or vomiting. 10/12/17  Yes Helberg, Larkin Ina, MD  torsemide (DEMADEX) 20 MG tablet Take 40 mg by mouth 2 (two) times daily.  09/04/17  Yes [provider]    Family History Family History  Problem Relation Age of Onset  . Diabetes Mellitus II Mother   . Hypertension Mother   . Hyperlipidemia Mother   . Kidney disease Mother   . Heart disease Father   . Heart attack Father 83  . Congestive Heart Failure Brother   . Congestive Heart Failure Son     Social History Social History   Tobacco Use  . Smoking status: Former Smoker    Last attempt to quit: 03/22/1995    Years since quitting: 22.7  . Smokeless tobacco: Never Used  Substance Use Topics  . Alcohol use: No  . Drug use: No     Allergies   Compazine [prochlorperazine edisylate]; Prochlorperazine; and Vioxx [rofecoxib]   Review of Systems Review of Systems  Constitutional: Negative for chills and fever.  HENT: Negative for congestion and sore throat.   Eyes: Negative for visual disturbance.  Respiratory: Positive for cough and shortness of breath. Negative for chest tightness and wheezing.   Cardiovascular: Negative for chest pain and palpitations.  Gastrointestinal: Negative for abdominal pain, diarrhea, nausea and vomiting.  Genitourinary: Negative for dysuria.  Musculoskeletal: Negative for back pain and neck pain.  Skin: Negative for rash.  Neurological: Negative for syncope, weakness, light-headedness and headaches.     Physical Exam Updated Vital Signs BP (!) 186/90   Pulse 88   Temp 98 F (36.7 C) (Oral)   Resp 18   Ht 5' 2.5" (1.588 m)   Wt 117.9 kg (260 lb)   LMP 02/02/2015   SpO2 92%   BMI 46.80 kg/m   Physical Exam  Constitutional: She appears well-developed and well-nourished.  Non-toxic  appearance. She does not appear ill. No distress.  HENT:  Head: Normocephalic and atraumatic.  Mouth/Throat: Oropharynx is clear and moist.  Eyes: Conjunctivae are normal. Pupils are equal, round, and reactive to light. Right eye exhibits no discharge. Left eye exhibits no discharge.  Neck: Neck supple. No JVD present.  Cardiovascular: Normal rate, regular rhythm, normal heart sounds and intact distal pulses. Exam reveals no gallop and no friction rub.  No murmur heard. Pulmonary/Chest: Effort normal. No accessory muscle usage or stridor. No tachypnea. No respiratory distress. She has no wheezes. She has no rales.  Crackles to bilateral bases. Symmetric chest expansion bilaterally. No increased work of breathing. No rales or rhonchi.    Abdominal: Soft. Bowel sounds are normal. She exhibits no distension, no ascites and no mass. There is no tenderness. There is no rebound and no guarding.  Musculoskeletal: She exhibits no edema.  Right lower leg: She exhibits no tenderness.       Left lower leg: She exhibits no tenderness.  Trace bilateral LE edema.   Lymphadenopathy:    She has no cervical adenopathy.  Neurological: She is alert. Coordination normal.  Skin: Skin is warm and dry. Capillary refill takes less than 2 seconds. No rash noted. She is not diaphoretic. No erythema. No pallor.  Psychiatric: She has a normal mood and affect. Her behavior is normal.  Nursing note and vitals reviewed.    ED Treatments / Results  Labs (all labs ordered are listed, but only abnormal results are displayed) Labs Reviewed  COMPREHENSIVE METABOLIC PANEL - Abnormal; Notable for the following components:      Result Value   CO2 21 (*)    Glucose, Bld 131 (*)    BUN 66 (*)    Creatinine, Ser 6.93 (*)    Calcium 8.2 (*)    Albumin 3.1 (*)    GFR calc non Af Amer 6 (*)    GFR calc Af Amer 7 (*)    All other components within normal limits  BRAIN NATRIURETIC PEPTIDE - Abnormal; Notable for the  following components:   B Natriuretic Peptide 563.5 (*)    All other components within normal limits  CBC WITH DIFFERENTIAL/PLATELET - Abnormal; Notable for the following components:   WBC 11.8 (*)    RBC 3.50 (*)    Hemoglobin 9.2 (*)    HCT 29.9 (*)    RDW 15.9 (*)    Neutro Abs 9.0 (*)    All other components within normal limits  LIPASE, BLOOD  CBC  BASIC METABOLIC PANEL  MAGNESIUM  PHOSPHORUS  I-STAT TROPONIN, ED    EKG  EKG Interpretation  Date/Time:  Wednesday December 08 2017 18:42:45 EST Ventricular Rate:  74 PR Interval:    QRS Duration: 110 QT Interval:  453 QTC Calculation: 503 R Axis:   9 Text Interpretation:  Sinus rhythm Anterior infarct, old No acute changes No significant change since last tracing Confirmed by Varney Biles [phone]) on 12/08/2017 7:15:15 PM       Radiology Dg Chest 2 View  Result Date: 12/08/2017 CLINICAL DATA:  Intermittent shortness of breath, severe today. History of CHF. EXAM: CHEST  2 VIEW COMPARISON:  Chest radiograph October 24, 2016 FINDINGS: Moderate cardiomegaly, increased from prior examination. Pulmonary vascular congestion and interstitial prominence without pleural effusion or focal consolidation. No pneumothorax. Large body habitus. Osseous structures are nonsuspicious. IMPRESSION: Moderate cardiomegaly and interstitial edema. Electronically Signed   By: Elon Alas M.D.   On: 12/08/2017 19:45    Procedures Procedures (including critical care time)  CRITICAL CARE Performed by: Hanley Hays   Total critical care time: 45 minutes  Critical care time was exclusive of separately billable procedures and treating other patients.  Critical care was necessary to treat or prevent imminent or life-threatening deterioration.  Critical care was time spent personally by me on the following activities: development of treatment plan with patient and/or surrogate as well as nursing, discussions with consultants,  evaluation of patient's response to treatment, examination of patient, obtaining history from patient or surrogate, ordering and performing treatments and interventions, ordering and review of laboratory studies, ordering and review of radiographic studies, pulse oximetry and re-evaluation of patient's condition.   Medications Ordered in ED Medications  0.9 %  sodium chloride infusion (not administered)  0.9 %  sodium chloride infusion (not administered)  heparin injection 5,000 Units (not administered)  ipratropium (  ATROVENT) nebulizer solution 0.5 mg (not administered)  nicardipine (CARDENE) 20mg  in 0.86% saline 238ml IV infusion (0.1 mg/ml) (not administered)  ondansetron (ZOFRAN) injection 4 mg (not administered)  hydrALAZINE (APRESOLINE) tablet 50 mg (not administered)  labetalol (NORMODYNE) tablet 400 mg (not administered)  torsemide (DEMADEX) tablet 40 mg (not administered)  nicardipine (CARDENE) 20mg  in 0.86% saline 262ml IV infusion (0.1 mg/ml) (0 mg/hr Intravenous Stopped 12/08/17 2330)  nicardipine (CARDENE) 20mg  in 0.86% saline 228ml IV infusion (0.1 mg/ml) (10 mg/hr Intravenous New Bag/Given 12/08/17 2332)     Initial Impression / Assessment and Plan / ED Course  I have reviewed the triage vital signs and the nursing notes.  Pertinent labs & imaging results that were available during my care of the patient were reviewed by me and considered in my medical decision making (see chart for details).    This is a 56 y.o. Female who presents to the emergency department complaining of shortness of breath with gradual onset this afternoon.  Patient reports gradual onset of shortness of breath this afternoon without associated chest pain.  She reports she is feeling better now and earlier she felt like she could not take a deep breath.  She reports she felt like she was breathing shallow.  She also reports that she is not been able to keep down her medications due to vomiting.  She tells  me for the past more than 3 months she has been having vomiting after taking her medications.  She also reports sometimes she vomits after eating food.  She has had grapefruit to eat today with out vomiting.  She reports she tried taking her medications today and vomited.  She has not been able to take her blood pressure medications.  She denies having any abdominal pain.  No treatments attempted prior to arrival today. On exam the patient is afebrile nontoxic-appearing.  She has no increased work of breathing.  She does have crackles to bilateral bases.  She is extremely hypertensive with a pressure of 264/119 on arrival. Chest x-ray shows moderate cardiomegaly and interstitial edema.  Patient with hypertensive emergency. Will start Nicardipine drip to get patient's MAP from 154 to 115. Blood work still pending.  BNP is elevated at 563.  Troponin is not elevated.  CMP shows a creatinine of 6.93 which is around her baseline.   At subsequent re-evaluations patient's blood pressure improved to a map of 115.  We will plan for admission.  Patient agrees with plan for admission. I consulted with critical care medicine who accepted the patient for admission.   This patient was discussed with Dr. Kathrynn Humble who agrees with assessment and plan.   Final Clinical Impressions(s) / ED Diagnoses   Final diagnoses:  Hypertensive emergency  Acute on chronic combined systolic and diastolic congestive heart failure [facility])    ED Discharge Orders    None       Waynetta Pean, PA-C 12/09/17 2330    Varney Biles, MD 12/09/17 2322

## 2017-12-08 NOTE — Consult Note (Signed)
PULMONARY / CRITICAL CARE MEDICINE   Name: Pamela Campbell MRN: [id number] DOB: Sep 11, 1961    ADMISSION DATE:  12/08/2017 CONSULTATION DATE:  12/08/2017  REFERRING MD: Dr. Kathrynn Humble  CHIEF COMPLAINT: Hypertension  HISTORY OF PRESENT ILLNESS:   56 year old female with past medical history significant for malignant hypertension, diastolic CHF, and diabetes.  She is followed by the cardiology clinic for her persistent hypertension.  She was recently admitted with hypertensive crisis in October of this year was treated acutely and was discharged on her home medications.  Since that time she has had trouble taking her home medications due to nausea and vomiting. Claims she is unable to keep them down.  She has been eating and drinking poorly over the past few months because of this.  She also endorses intermittent periods of shortness of breath.  This significantly worsened on the morning of 12/19 and was associated with some mild chest discomfort.  Upon arrival to the emergency department she was profoundly hypertensive and was started on a nicardipine infusion. PCCM asked to admit.   PAST MEDICAL HISTORY :  She  has a past medical history of Chronic diastolic CHF (congestive heart failure) ([place]), Diabetes mellitus without complication ([place]), and Hypertension.  PAST SURGICAL HISTORY: She  has a past surgical history that includes Fracture surgery; Tubal ligation; Cesarean section; and Cardiac catheterization (N/A, 11/02/2016).  Allergies  Allergen Reactions  . Compazine [Prochlorperazine Edisylate] Swelling  . Prochlorperazine Swelling    Tongue swelling   . Vioxx [Rofecoxib] Other (See Comments)    Other reaction(s): Bleeding (intolerance) Bleeding    No current facility-administered medications on file prior to encounter.    Current Outpatient Medications on File Prior to Encounter  Medication Sig  . calcitRIOL (ROCALTROL) 0.25 MCG capsule Take 0.25 capsules by mouth daily.  Marland Kitchen  Dextromethorphan-Guaifenesin (CORICIDIN HBP CONGESTION/COUGH) 10-200 MG CAPS Take 2 tablets by mouth daily as needed.  . hydrALAZINE (APRESOLINE) 50 MG tablet Take 1 tablet (50 mg total) by mouth 3 (three) times daily.  Marland Kitchen labetalol (NORMODYNE) 200 MG tablet Take 400 mg by mouth 2 (two) times daily.  . ondansetron (ZOFRAN-ODT) 4 MG disintegrating tablet Take 1 tablet (4 mg total) by mouth every 6 (six) hours as needed for nausea or vomiting.  . torsemide (DEMADEX) 20 MG tablet Take 40 mg by mouth 2 (two) times daily.     FAMILY HISTORY:  Her indicated that her mother is deceased. She indicated that her father is deceased. She indicated that her sister is alive. She indicated that her brother is alive. She indicated that her maternal grandmother is deceased. She indicated that her maternal grandfather is deceased. She indicated that her paternal grandmother is deceased. She indicated that her paternal grandfather is deceased. She indicated that her daughter is alive. She indicated that her son is alive.   SOCIAL HISTORY: She  reports that she quit smoking about 22 years ago. she has never used smokeless tobacco. She reports that she does not drink alcohol or use drugs.  Review of Systems:   Bolds are positive  Constitutional: weight loss, gain, night sweats, Fevers, chills, fatigue .  HEENT: headaches, Sore throat, sneezing, nasal congestion, post nasal drip, Difficulty swallowing, Tooth/dental problems, visual complaints visual changes, ear ache CV:  chest pain, radiates:,Orthopnea, PND, swelling in lower extremities, dizziness, palpitations, syncope.  GI  heartburn, indigestion, abdominal pain, nausea, vomiting, diarrhea, change in bowel habits, loss of appetite, bloody stools.  Resp: cough, productive: , hemoptysis, dyspnea, chest pain, pleuritic.  Skin: rash or itching or icterus GU: dysuria, change in color of urine, urgency or frequency. flank pain, hematuria  MS: joint pain or swelling.  decreased range of motion  Psych: change in mood or affect. depression or anxiety.  Neuro: difficulty with speech, weakness, numbness, ataxia    SUBJECTIVE:    VITAL SIGNS: BP (!) 186/90   Pulse 88   Temp 98 F (36.7 C) (Oral)   Resp 18   Ht 5' 2.5" (1.588 m)   Wt 117.9 kg (260 lb)   LMP 02/02/2015   SpO2 92%   BMI 46.80 kg/m   HEMODYNAMICS:    VENTILATOR SETTINGS:    INTAKE / OUTPUT: No intake/output data recorded.  PHYSICAL EXAMINATION: General:   Morbidly obese female in NAD Neuro:  Alert, oriented, non-focal HEENT:  [place]/At, PERRL, no appreciable JVD Cardiovascular:  RRR, no MRG Lungs:  Distant but sound clear Abdomen:  Soft, non-tender, non-distneded Musculoskeletal:  No acute deformity or ROM limitation Skin:  +2 BLE edema   LABS:  BMET Recent Labs  Lab 12/08/17 1926  NA 139  K 4.2  CL 108  CO2 21*  BUN 66*  CREATININE 6.93*  GLUCOSE 131*    Electrolytes Recent Labs  Lab 12/08/17 1926  CALCIUM 8.2*    CBC Recent Labs  Lab 12/08/17 1926  WBC 11.8*  HGB 9.2*  HCT 29.9*  PLT 262    Coag's No results for input(s): APTT, INR in the last 168 hours.  Sepsis Markers No results for input(s): LATICACIDVEN, PROCALCITON, O2SATVEN in the last 168 hours.  ABG No results for input(s): PHART, PCO2ART, PO2ART in the last 168 hours.  Liver Enzymes Recent Labs  Lab 12/08/17 1926  AST 28  ALT 37  ALKPHOS 96  BILITOT 0.7  ALBUMIN 3.1*    Cardiac Enzymes No results for input(s): TROPONINI, PROBNP in the last 168 hours.  Glucose No results for input(s): GLUCAP in the last 168 hours.  Imaging Dg Chest 2 View  Result Date: 12/08/2017 CLINICAL DATA:  Intermittent shortness of breath, severe today. History of CHF. EXAM: CHEST  2 VIEW COMPARISON:  Chest radiograph October 24, 2016 FINDINGS: Moderate cardiomegaly, increased from prior examination. Pulmonary vascular congestion and interstitial prominence without pleural effusion or focal  consolidation. No pneumothorax. Large body habitus. Osseous structures are nonsuspicious. IMPRESSION: Moderate cardiomegaly and interstitial edema. Electronically Signed   By: Elon Alas M.D.   On: 12/08/2017 19:45    STUDIES:  Echo 10/2017 > LVEF 55-60%. LVH, wall motion normal. Grade 2 DD.   CULTURES:   ANTIBIOTICS:   SIGNIFICANT EVENTS: 12/19 admit  LINES/TUBES:   DISCUSSION:   ASSESSMENT / PLAN:  Hypertensive Urgency: uncontrolled HTN at baseline. She claims due to nausea and vomiting of her medications.  - Nicardipine infusion with MAP goal 115 to 125. Caution to lower quickly due to the chronicity of this.  - Continue home antihypertensive medications (hydralazine, labetalol, torsemide) - Recent echo will not repeat - Cardiology consultation in AM - Has seen Billey Chang PA in office several times in the last few months.   Acute hypoxic respiratory failure: reportedly better with albuterol. But no history of smoking, COPD, or asthma. Suspect SOB secondary to pulmonary edema and diastolic CHF.  - Supplemental O2 as indicated to keep sats > 92%. - Restarting home diuretic. - Caution with IV diuretic due to chronically high creatinine.  - BP control. - PRN duoneb. - fluid restrict.  CKD: creatinine 6.93 only mildly above her  baseline. Was following with Dr. Florene Glen, but now seeing someone else.  - Follow BMP - Suspect she will soon be ESRD if not during this admission  DM: - CBG monitoring and SSI  Nausea/Vomiting:  - heart healthy/carb modified diet as tolerated - Zofran PRN  Leg cramping: Feels like lumps in her legs - Venous dopplers of BLE to r/o PE  Georgann Housekeeper, AGACNP-BC [facility] Pulmonology/Critical Care Pager [phone] or [phone]  12/09/2017 12:07 AM

## 2017-12-08 NOTE — ED Triage Notes (Signed)
Per EMS pt came from Urgent care, she complains SOB in 1630 today.  Pt BP was over 488 systolic.  NAD noted AOx4

## 2017-12-08 NOTE — ED Triage Notes (Signed)
Pt pulled out of car, wheezing, unable to speak due to not catching her breathe. Cathlean Sauer, PA at the bedside. Verbal for duo neb placed.

## 2017-12-09 ENCOUNTER — Inpatient Hospital Stay (HOSPITAL_COMMUNITY): Payer: Managed Care, Other (non HMO)

## 2017-12-09 ENCOUNTER — Other Ambulatory Visit: Payer: Self-pay

## 2017-12-09 DIAGNOSIS — R0602 Shortness of breath: Secondary | ICD-10-CM

## 2017-12-09 DIAGNOSIS — N184 Chronic kidney disease, stage 4 (severe): Secondary | ICD-10-CM

## 2017-12-09 DIAGNOSIS — I5033 Acute on chronic diastolic (congestive) heart failure: Secondary | ICD-10-CM

## 2017-12-09 DIAGNOSIS — I160 Hypertensive urgency: Secondary | ICD-10-CM

## 2017-12-09 LAB — BASIC METABOLIC PANEL
Anion gap: 8 (ref 5–15)
BUN: 63 mg/dL — AB (ref 6–20)
CHLORIDE: 109 mmol/L (ref 101–111)
CO2: 23 mmol/L (ref 22–32)
CREATININE: 6.95 mg/dL — AB (ref 0.44–1.00)
Calcium: 7.9 mg/dL — ABNORMAL LOW (ref 8.9–10.3)
GFR calc Af Amer: 7 mL/min — ABNORMAL LOW (ref >60)
GFR calc non Af Amer: 6 mL/min — ABNORMAL LOW (ref >60)
Glucose, Bld: 121 mg/dL — ABNORMAL HIGH (ref 65–99)
POTASSIUM: 4.4 mmol/L (ref 3.5–5.1)
Sodium: 140 mmol/L (ref 135–145)

## 2017-12-09 LAB — RAPID URINE DRUG SCREEN, HOSP PERFORMED
AMPHETAMINES: NOT DETECTED
BARBITURATES: NOT DETECTED
BENZODIAZEPINES: NOT DETECTED
Cocaine: NOT DETECTED
Opiates: NOT DETECTED
Tetrahydrocannabinol: NOT DETECTED

## 2017-12-09 LAB — PHOSPHORUS: PHOSPHORUS: 6.4 mg/dL — AB (ref 2.5–4.6)

## 2017-12-09 LAB — GLUCOSE, CAPILLARY
GLUCOSE-CAPILLARY: 122 mg/dL — AB (ref 65–99)
Glucose-Capillary: 118 mg/dL — ABNORMAL HIGH (ref 65–99)

## 2017-12-09 LAB — MRSA PCR SCREENING: MRSA by PCR: NEGATIVE

## 2017-12-09 LAB — CBC
HEMATOCRIT: 24.6 % — AB (ref 36.0–46.0)
HEMOGLOBIN: 7.8 g/dL — AB (ref 12.0–15.0)
MCH: 27.1 pg (ref 26.0–34.0)
MCHC: 31.7 g/dL (ref 30.0–36.0)
MCV: 85.4 fL (ref 78.0–100.0)
Platelets: 230 10*3/uL (ref 150–400)
RBC: 2.88 MIL/uL — AB (ref 3.87–5.11)
RDW: 16 % — ABNORMAL HIGH (ref 11.5–15.5)
WBC: 9.4 10*3/uL (ref 4.0–10.5)

## 2017-12-09 LAB — MAGNESIUM: Magnesium: 1.9 mg/dL (ref 1.7–2.4)

## 2017-12-09 MED ORDER — LABETALOL HCL 200 MG PO TABS
400.0000 mg | ORAL_TABLET | Freq: Two times a day (BID) | ORAL | Status: DC
  Administered 2017-12-09 – 2017-12-10 (×4): 400 mg via ORAL
  Filled 2017-12-09 (×4): qty 2

## 2017-12-09 MED ORDER — HYDRALAZINE HCL 50 MG PO TABS
50.0000 mg | ORAL_TABLET | Freq: Three times a day (TID) | ORAL | Status: DC
  Administered 2017-12-09 – 2017-12-10 (×4): 50 mg via ORAL
  Filled 2017-12-09 (×6): qty 1

## 2017-12-09 MED ORDER — ONDANSETRON HCL 4 MG/2ML IJ SOLN
4.0000 mg | Freq: Four times a day (QID) | INTRAMUSCULAR | Status: DC | PRN
  Administered 2017-12-09 (×2): 4 mg via INTRAVENOUS
  Filled 2017-12-09 (×3): qty 2

## 2017-12-09 MED ORDER — TORSEMIDE 20 MG PO TABS
40.0000 mg | ORAL_TABLET | Freq: Two times a day (BID) | ORAL | Status: DC
  Administered 2017-12-09 – 2017-12-10 (×3): 40 mg via ORAL
  Filled 2017-12-09 (×4): qty 2

## 2017-12-09 NOTE — Progress Notes (Signed)
PULMONARY / CRITICAL CARE MEDICINE   Name: Pamela Campbell MRN: [id number] DOB: 11/01/1961    ADMISSION DATE:  12/08/2017 CONSULTATION DATE:  12/08/2017  REFERRING MD: Dr. Kathrynn Humble  CHIEF COMPLAINT: Hypertension  HISTORY OF PRESENT ILLNESS:   56 year old female with past medical history significant for malignant hypertension, diastolic CHF, and diabetes.  She is followed by the cardiology clinic for her persistent hypertension.  She was recently admitted with hypertensive crisis in October of this year was treated acutely and was discharged on her home medications.  Since that time she has had trouble taking her home medications due to nausea and vomiting. Claims she is unable to keep them down.  She has been eating and drinking poorly over the past few months because of this.  She also endorses intermittent periods of shortness of breath.  This significantly worsened on the morning of 12/19 and was associated with some mild chest discomfort.  Upon arrival to the emergency department she was profoundly hypertensive and was started on a nicardipine infusion. PCCM asked to admit.   PAST MEDICAL HISTORY :  She  has a past medical history of Chronic diastolic CHF (congestive heart failure) ([place]), Diabetes mellitus without complication ([place]), and Hypertension.  PAST SURGICAL HISTORY: She  has a past surgical history that includes Fracture surgery; Tubal ligation; Cesarean section; and Cardiac catheterization (N/A, 11/02/2016).  Allergies  Allergen Reactions  . Compazine [Prochlorperazine Edisylate] Swelling  . Prochlorperazine Swelling    Tongue swelling   . Vioxx [Rofecoxib] Other (See Comments)    Other reaction(s): Bleeding (intolerance) Bleeding    No current facility-administered medications on file prior to encounter.    Current Outpatient Medications on File Prior to Encounter  Medication Sig  . calcitRIOL (ROCALTROL) 0.25 MCG capsule Take 0.25 capsules by mouth daily.  Marland Kitchen  Dextromethorphan-Guaifenesin (CORICIDIN HBP CONGESTION/COUGH) 10-200 MG CAPS Take 2 tablets by mouth daily as needed.  . hydrALAZINE (APRESOLINE) 50 MG tablet Take 1 tablet (50 mg total) by mouth 3 (three) times daily.  Marland Kitchen labetalol (NORMODYNE) 200 MG tablet Take 400 mg by mouth 2 (two) times daily.  . ondansetron (ZOFRAN-ODT) 4 MG disintegrating tablet Take 1 tablet (4 mg total) by mouth every 6 (six) hours as needed for nausea or vomiting.  . torsemide (DEMADEX) 20 MG tablet Take 40 mg by mouth 2 (two) times daily.     FAMILY HISTORY:  Her indicated that her mother is deceased. She indicated that her father is deceased. She indicated that her sister is alive. She indicated that her brother is alive. She indicated that her maternal grandmother is deceased. She indicated that her maternal grandfather is deceased. She indicated that her paternal grandmother is deceased. She indicated that her paternal grandfather is deceased. She indicated that her daughter is alive. She indicated that her son is alive.   SOCIAL HISTORY: She  reports that she quit smoking about 22 years ago. she has never used smokeless tobacco. She reports that she does not drink alcohol or use drugs.  Review of Systems:   Bolds are positive  Constitutional: weight loss, gain, night sweats, Fevers, chills, fatigue .  HEENT: headaches, Sore throat, sneezing, nasal congestion, post nasal drip, Difficulty swallowing, Tooth/dental problems, visual complaints visual changes, ear ache CV:  chest pain, radiates:,Orthopnea, PND, swelling in lower extremities, dizziness, palpitations, syncope.  GI  heartburn, indigestion, abdominal pain, nausea, vomiting, diarrhea, change in bowel habits, loss of appetite, bloody stools.  Resp: cough, productive: , hemoptysis, dyspnea, chest pain, pleuritic.  Skin: rash or itching or icterus GU: dysuria, change in color of urine, urgency or frequency. flank pain, hematuria  MS: joint pain or swelling.  decreased range of motion  Psych: change in mood or affect. depression or anxiety.  Neuro: difficulty with speech, weakness, numbness, ataxia    SUBJECTIVE:  Remains off nicardipine drip. Nausea is better with Zofran.  VITAL SIGNS: BP (!) 155/68   Pulse 70   Temp 97.6 F (36.4 C) (Oral)   Resp (!) 21   Ht 5\' 2"  (1.575 m)   Wt 268 lb 1.3 oz (121.6 kg)   LMP 02/02/2015   SpO2 98%   BMI 49.03 kg/m   HEMODYNAMICS:    VENTILATOR SETTINGS:    INTAKE / OUTPUT: I/O last 3 completed shifts: In: 251.7 [I.V.:251.7] Out: -   PHYSICAL EXAMINATION: Gen:      No acute distress, obese HEENT:  EOMI, sclera anicteric Neck:     No masses; no thyromegaly Lungs:    Clear to auscultation bilaterally; normal respiratory effort CV:         Regular rate and rhythm; no murmurs Abd:      + bowel sounds; soft, non-tender; no palpable masses, no distension Ext:    + edema; adequate peripheral perfusion Skin:      Warm and dry; no rash Neuro: alert and oriented x 3 Psych: normal mood and affect  LABS:  BMET Recent Labs  Lab 12/08/17 1926 12/09/17 0738  NA 139 140  K 4.2 4.4  CL 108 109  CO2 21* 23  BUN 66* 63*  CREATININE 6.93* 6.95*  GLUCOSE 131* 121*    Electrolytes Recent Labs  Lab 12/08/17 1926 12/09/17 0738  CALCIUM 8.2* 7.9*  MG  --  1.9  PHOS  --  6.4*    CBC Recent Labs  Lab 12/08/17 1926 12/09/17 0738  WBC 11.8* 9.4  HGB 9.2* 7.8*  HCT 29.9* 24.6*  PLT 262 230    Coag's No results for input(s): APTT, INR in the last 168 hours.  Sepsis Markers No results for input(s): LATICACIDVEN, PROCALCITON, O2SATVEN in the last 168 hours.  ABG No results for input(s): PHART, PCO2ART, PO2ART in the last 168 hours.  Liver Enzymes Recent Labs  Lab 12/08/17 1926  AST 28  ALT 37  ALKPHOS 96  BILITOT 0.7  ALBUMIN 3.1*    Cardiac Enzymes No results for input(s): TROPONINI, PROBNP in the last 168 hours.  Glucose Recent Labs  Lab 12/09/17 0315  12/09/17 0709  GLUCAP 122* 118*    Imaging Dg Chest 2 View  Result Date: 12/08/2017 CLINICAL DATA:  Intermittent shortness of breath, severe today. History of CHF. EXAM: CHEST  2 VIEW COMPARISON:  Chest radiograph October 24, 2016 FINDINGS: Moderate cardiomegaly, increased from prior examination. Pulmonary vascular congestion and interstitial prominence without pleural effusion or focal consolidation. No pneumothorax. Large body habitus. Osseous structures are nonsuspicious. IMPRESSION: Moderate cardiomegaly and interstitial edema. Electronically Signed   By: Elon Alas M.D.   On: 12/08/2017 19:45    STUDIES:  Echo 10/2017 > LVEF 55-60%. LVH, wall motion normal. Grade 2 DD.   CULTURES:   ANTIBIOTICS:   SIGNIFICANT EVENTS: 12/19 admit  LINES/TUBES:   DISCUSSION:   ASSESSMENT / PLAN: Hypertensive Urgency: uncontrolled HTN at baseline. She claims due to nausea and vomiting of her medications.  Off nicardipine drip Continue home antihypertensive medications (hydralazine, labetalol, torsemide) Recent echo, will not repeat  Acute hypoxic respiratory failure: reportedly better with albuterol. But no  history of smoking, COPD, or asthma. Suspect SOB secondary to pulmonary edema and diastolic CHF.  Supplemental O2 as indicated to keep sats > 92%. On home diuretics PRN duonebs  CKD: creatinine 6.93 only mildly above her baseline. Was following with Dr. Florene Glen Follow BMP May need nephrology consult but pt wants to see MDs in [facility].   DM: CBG monitoring and SSI  Nausea/Vomiting:  Heart healthy/carb modified diet as tolerated Zofran PRN  Leg cramping: Feels like lumps in her legs Venous dopplers of BLE to r/o PE  Stable for transfer to [place] MD [place] Pulmonary and Critical Care Pager [phone] If no answer or after 3pm call: [phone] 12/09/2017, 10:43 AM

## 2017-12-09 NOTE — ED Notes (Addendum)
Pt MAP  has dropped below targeted to as low as 81. Paged critcal care was told to hold drip and see what her map does.

## 2017-12-09 NOTE — Progress Notes (Signed)
VASCULAR LAB PRELIMINARY  PRELIMINARY  PRELIMINARY  PRELIMINARY  Bilateral lower extremity venous duplex completed.    Preliminary report:  There is no obvious evidence of DVT or SVT noted in the visualized veins of the bilateral lower extremities.  There is a small area of phlebitis noted in the right GSV at the lower calf.    Maragret Vanacker, RVT 12/09/2017, 11:29 AM

## 2017-12-10 ENCOUNTER — Ambulatory Visit: Payer: Managed Care, Other (non HMO) | Admitting: Cardiovascular Disease

## 2017-12-10 LAB — BASIC METABOLIC PANEL
Anion gap: 7 (ref 5–15)
BUN: 67 mg/dL — AB (ref 6–20)
CHLORIDE: 108 mmol/L (ref 101–111)
CO2: 23 mmol/L (ref 22–32)
Calcium: 7.9 mg/dL — ABNORMAL LOW (ref 8.9–10.3)
Creatinine, Ser: 7.16 mg/dL — ABNORMAL HIGH (ref 0.44–1.00)
GFR calc Af Amer: 7 mL/min — ABNORMAL LOW (ref >60)
GFR calc non Af Amer: 6 mL/min — ABNORMAL LOW (ref >60)
GLUCOSE: 137 mg/dL — AB (ref 65–99)
POTASSIUM: 4.4 mmol/L (ref 3.5–5.1)
Sodium: 138 mmol/L (ref 135–145)

## 2017-12-10 LAB — CBC
HCT: 26 % — ABNORMAL LOW (ref 36.0–46.0)
HEMOGLOBIN: 8.2 g/dL — AB (ref 12.0–15.0)
MCH: 26.8 pg (ref 26.0–34.0)
MCHC: 31.5 g/dL (ref 30.0–36.0)
MCV: 85 fL (ref 78.0–100.0)
Platelets: 240 10*3/uL (ref 150–400)
RBC: 3.06 MIL/uL — AB (ref 3.87–5.11)
RDW: 15.7 % — ABNORMAL HIGH (ref 11.5–15.5)
WBC: 9 10*3/uL (ref 4.0–10.5)

## 2017-12-10 LAB — PHOSPHORUS: Phosphorus: 6.8 mg/dL — ABNORMAL HIGH (ref 2.5–4.6)

## 2017-12-10 LAB — MAGNESIUM: MAGNESIUM: 2 mg/dL (ref 1.7–2.4)

## 2017-12-10 MED ORDER — ONDANSETRON HCL 4 MG PO TABS
4.0000 mg | ORAL_TABLET | Freq: Three times a day (TID) | ORAL | Status: DC | PRN
  Administered 2017-12-10: 4 mg via ORAL
  Filled 2017-12-10: qty 1

## 2017-12-10 NOTE — Discharge Instructions (Signed)
Follow up with nephrology as per instructed.

## 2017-12-10 NOTE — Discharge Summary (Signed)
Physician Discharge Summary  Patient ID: Pamela Campbell MRN: [id number] DOB/AGE: September 04, 1961 56 y.o.  Admit date: 12/08/2017 Discharge date: 12/10/2017  Problem List Active Problems:   Hypertensive urgency  HPI: 56 year old female with past medical history significant for malignant hypertension, diastolic CHF, and diabetes.  She is followed by the cardiology clinic for her persistent hypertension.  She was recently admitted with hypertensive crisis in October of this year was treated acutely and was discharged on her home medications.  Since that time she has had trouble taking her home medications due to nausea and vomiting. Claims she is unable to keep them down.  She has been eating and drinking poorly over the past few months because of this.  She also endorses intermittent periods of shortness of breath.  This significantly worsened on the morning of 12/19 and was associated with some mild chest discomfort.  Upon arrival to the emergency department she was profoundly hypertensive and was started on a nicardipine infusion. PCCM asked to admit.    Hospital Course:   ASSESSMENT / PLAN: Hypertensive Urgency: uncontrolled HTN at baseline. She claims due to nausea and vomiting of her medications.  Off nicardipine drip Continue home antihypertensive medications (hydralazine, labetalol, torsemide) Recent echo, will not repeat 12/10/2017 blood pressure is under adequate control currently systolic blood pressure is 616 with a diastolic of 76 and a heart rate of 96.  Acute hypoxic respiratory failure: reportedly better with albuterol. But no history of smoking, COPD, or asthma. Suspect SOB secondary to pulmonary edema and diastolic CHF.  Supplemental O2 as indicated to keep sats > 92%. On home diuretics PRN duonebs 12/10/2017 she is in no acute distress.  She is on room air.  She is reached maximal hospital benefit.  Discharge to home with instructions to follow-up with her primary care  physician and nephrology at outside hospital.  CKD:  Lab Results  Component Value Date   CREATININE 7.16 (H) 12/10/2017   CREATININE 6.95 (H) 12/09/2017   CREATININE 6.93 (H) 12/08/2017   CREATININE 2.18 (H) 12/03/2016   CREATININE 1.93 (H) 11/09/2016   CREATININE 1.25 (H) 05/27/2015    creatinine 6.93 only mildly above her baseline. Was following with Dr. Florene Glen Follow BMP May need nephrology consult but pt wants to see MDs in [facility]  She been instructed to seek out nephrology consult as an outpatient. She wants to see somebody outside the [facility] system therefore she will need to make that appointment.  She was given instructions on how to call to make appointments. When she follows up with her primary care physician she will need a creatinine at that time also.  DM: CBG (last 3)  Recent Labs    12/09/17 0315 12/09/17 0709  GLUCAP 122* 118*    CBG monitoring and SSI  Nausea/Vomiting:  Heart healthy/carb modified diet as tolerated Zofran PRN  Leg cramping: Feels like lumps in her legs Venous dopplers of BLE to r/o PE.  Bilateral Doppler studies of lower extremities were negative.   General: Morbidly obese female no acute distress, sitting up in bed eating.  Currently off all oxygen. HEENT: No JVD or lymphadenopathy PSY: Normal affect Neuro: Intact CV: Heart sounds are regular regular rate and rhythm PULM: Clear to auscultation WV:PXTG, non-tender, bsx4 active  Extremities: warm/dry, 1+ edema  Skin: no rashes or lesions    Labs at discharge Lab Results  Component Value Date   CREATININE 7.16 (H) 12/10/2017   BUN 67 (H) 12/10/2017   NA 138 12/10/2017  K 4.4 12/10/2017   CL 108 12/10/2017   CO2 23 12/10/2017   Lab Results  Component Value Date   WBC 9.0 12/10/2017   HGB 8.2 (L) 12/10/2017   HCT 26.0 (L) 12/10/2017   MCV 85.0 12/10/2017   PLT 240 12/10/2017   Lab Results  Component Value Date   ALT 37 12/08/2017   AST 28 12/08/2017    ALKPHOS 96 12/08/2017   BILITOT 0.7 12/08/2017   Lab Results  Component Value Date   INR 0.99 10/30/2016    Current radiology studies Dg Chest 2 View  Result Date: 12/08/2017 CLINICAL DATA:  Intermittent shortness of breath, severe today. History of CHF. EXAM: CHEST  2 VIEW COMPARISON:  Chest radiograph October 24, 2016 FINDINGS: Moderate cardiomegaly, increased from prior examination. Pulmonary vascular congestion and interstitial prominence without pleural effusion or focal consolidation. No pneumothorax. Large body habitus. Osseous structures are nonsuspicious. IMPRESSION: Moderate cardiomegaly and interstitial edema. Electronically Signed   By: Elon Alas M.D.   On: 12/08/2017 19:45    Disposition:  01-Home or Self Care  Discharge Instructions    Diet - low sodium heart healthy   Complete by:  As directed    Increase activity slowly   Complete by:  As directed      Allergies as of 12/10/2017      Reactions   Compazine [prochlorperazine Edisylate] Swelling   Prochlorperazine Swelling   Tongue swelling    Vioxx [rofecoxib] Other (See Comments)   Other reaction(s): Bleeding (intolerance) Bleeding      Medication List    STOP taking these medications   CORICIDIN HBP CONGESTION/COUGH 10-200 MG Caps Generic drug:  Dextromethorphan-Guaifenesin     TAKE these medications   calcitRIOL 0.25 MCG capsule Commonly known as:  ROCALTROL Take 0.25 capsules by mouth daily.   hydrALAZINE 50 MG tablet Commonly known as:  APRESOLINE Take 1 tablet (50 mg total) by mouth 3 (three) times daily.   labetalol 200 MG tablet Commonly known as:  NORMODYNE Take 400 mg by mouth 2 (two) times daily.   ondansetron 4 MG disintegrating tablet Commonly known as:  ZOFRAN-ODT Take 1 tablet (4 mg total) by mouth every 6 (six) hours as needed for nausea or vomiting.   torsemide 20 MG tablet Commonly known as:  DEMADEX Take 40 mg by mouth 2 (two) times daily.      Follow-up  Information    Via, Lennette Bihari, MD Follow up.   Specialty:  Family Medicine Why:  follow up at scheduled time. Contact information: [place] [address] [phone]            Discharged Condition: fair  Time spent on discharge  40 minutes.  Vital signs at Discharge. Temp:  [97.9 F (36.6 C)-98.3 F (36.8 C)] 98.1 F (36.7 C) (12/21 0700) Pulse Rate:  [59-73] 65 (12/21 0700) Resp:  [14-30] 29 (12/21 0700) BP: (143-185)/(68-128) 154/76 (12/21 0935) SpO2:  [89 %-98 %] 93 % (12/21 0700) Weight:  [122.3 kg (269 lb 11.2 oz)] 122.3 kg (269 lb 11.2 oz) (12/21 0551) Office follow up Special Information or instructions:  No follow-up with pulmonary critical care as needed at this time.  She is been informed to follow-up with her primary care physician Dr. via and she is also been instructed to seek out a nephrology consult outside of the [facility] system as she is dissatisfied with the [facility] nephrologist.  Signed: Richardson Landry Minor ACNP Maryanna Shape PCCM Pager [phone] till 1 pm If  no answer page [phone] 12/10/2017, 9:44 AM

## 2017-12-30 ENCOUNTER — Inpatient Hospital Stay (HOSPITAL_COMMUNITY)
Admission: EM | Admit: 2017-12-30 | Discharge: 2018-01-05 | DRG: 286 | Disposition: A | Discharge status: Home / Self Care | Payer: Managed Care, Other (non HMO) | Attending: Family Medicine | Admitting: Family Medicine

## 2017-12-30 ENCOUNTER — Emergency Department (HOSPITAL_COMMUNITY): Payer: Managed Care, Other (non HMO)

## 2017-12-30 ENCOUNTER — Encounter (HOSPITAL_COMMUNITY): Payer: Self-pay | Admitting: Emergency Medicine

## 2017-12-30 ENCOUNTER — Other Ambulatory Visit: Payer: Self-pay

## 2017-12-30 DIAGNOSIS — E1122 Type 2 diabetes mellitus with diabetic chronic kidney disease: Secondary | ICD-10-CM | POA: Diagnosis present

## 2017-12-30 DIAGNOSIS — I160 Hypertensive urgency: Secondary | ICD-10-CM

## 2017-12-30 DIAGNOSIS — E1121 Type 2 diabetes mellitus with diabetic nephropathy: Secondary | ICD-10-CM | POA: Diagnosis present

## 2017-12-30 DIAGNOSIS — Z419 Encounter for procedure for purposes other than remedying health state, unspecified: Secondary | ICD-10-CM

## 2017-12-30 DIAGNOSIS — I5033 Acute on chronic diastolic (congestive) heart failure: Secondary | ICD-10-CM | POA: Diagnosis present

## 2017-12-30 DIAGNOSIS — Z992 Dependence on renal dialysis: Secondary | ICD-10-CM | POA: Diagnosis not present

## 2017-12-30 DIAGNOSIS — J209 Acute bronchitis, unspecified: Secondary | ICD-10-CM | POA: Diagnosis present

## 2017-12-30 DIAGNOSIS — Z6841 Body Mass Index (BMI) 40.0 and over, adult: Secondary | ICD-10-CM

## 2017-12-30 DIAGNOSIS — E872 Acidosis, unspecified: Secondary | ICD-10-CM | POA: Diagnosis present

## 2017-12-30 DIAGNOSIS — Z833 Family history of diabetes mellitus: Secondary | ICD-10-CM

## 2017-12-30 DIAGNOSIS — I132 Hypertensive heart and chronic kidney disease with heart failure and with stage 5 chronic kidney disease, or end stage renal disease: Principal | ICD-10-CM | POA: Diagnosis present

## 2017-12-30 DIAGNOSIS — Z79899 Other long term (current) drug therapy: Secondary | ICD-10-CM

## 2017-12-30 DIAGNOSIS — Z8249 Family history of ischemic heart disease and other diseases of the circulatory system: Secondary | ICD-10-CM

## 2017-12-30 DIAGNOSIS — D631 Anemia in chronic kidney disease: Secondary | ICD-10-CM | POA: Diagnosis present

## 2017-12-30 DIAGNOSIS — N185 Chronic kidney disease, stage 5: Secondary | ICD-10-CM | POA: Diagnosis present

## 2017-12-30 DIAGNOSIS — N183 Chronic kidney disease, stage 3 (moderate): Secondary | ICD-10-CM

## 2017-12-30 DIAGNOSIS — I110 Hypertensive heart disease with heart failure: Secondary | ICD-10-CM

## 2017-12-30 DIAGNOSIS — E669 Obesity, unspecified: Secondary | ICD-10-CM

## 2017-12-30 DIAGNOSIS — Z9119 Patient's noncompliance with other medical treatment and regimen: Secondary | ICD-10-CM

## 2017-12-30 DIAGNOSIS — E1169 Type 2 diabetes mellitus with other specified complication: Secondary | ICD-10-CM | POA: Diagnosis present

## 2017-12-30 DIAGNOSIS — R059 Cough, unspecified: Secondary | ICD-10-CM | POA: Diagnosis present

## 2017-12-30 DIAGNOSIS — I2510 Atherosclerotic heart disease of native coronary artery without angina pectoris: Secondary | ICD-10-CM | POA: Diagnosis present

## 2017-12-30 DIAGNOSIS — R05 Cough: Secondary | ICD-10-CM | POA: Diagnosis present

## 2017-12-30 DIAGNOSIS — Z87891 Personal history of nicotine dependence: Secondary | ICD-10-CM

## 2017-12-30 DIAGNOSIS — Z9114 Patient's other noncompliance with medication regimen: Secondary | ICD-10-CM

## 2017-12-30 DIAGNOSIS — I10 Essential (primary) hypertension: Secondary | ICD-10-CM | POA: Diagnosis present

## 2017-12-30 LAB — CBC WITH DIFFERENTIAL/PLATELET
BASOS ABS: 0 10*3/uL (ref 0.0–0.1)
Basophils Relative: 0 %
Eosinophils Absolute: 0.2 10*3/uL (ref 0.0–0.7)
Eosinophils Relative: 2 %
HCT: 30.5 % — ABNORMAL LOW (ref 36.0–46.0)
HEMOGLOBIN: 9.5 g/dL — AB (ref 12.0–15.0)
LYMPHS ABS: 2.3 10*3/uL (ref 0.7–4.0)
Lymphocytes Relative: 16 %
MCH: 26 pg (ref 26.0–34.0)
MCHC: 31.1 g/dL (ref 30.0–36.0)
MCV: 83.6 fL (ref 78.0–100.0)
Monocytes Absolute: 1 10*3/uL (ref 0.1–1.0)
Monocytes Relative: 7 %
NEUTROS PCT: 75 %
Neutro Abs: 11.2 10*3/uL — ABNORMAL HIGH (ref 1.7–7.7)
PLATELETS: 324 10*3/uL (ref 150–400)
RBC: 3.65 MIL/uL — AB (ref 3.87–5.11)
RDW: 15.3 % (ref 11.5–15.5)
WBC: 14.8 10*3/uL — AB (ref 4.0–10.5)

## 2017-12-30 LAB — MAGNESIUM: MAGNESIUM: 1.9 mg/dL (ref 1.7–2.4)

## 2017-12-30 LAB — BRAIN NATRIURETIC PEPTIDE: B NATRIURETIC PEPTIDE 5: 446.8 pg/mL — AB (ref 0.0–100.0)

## 2017-12-30 LAB — BASIC METABOLIC PANEL
ANION GAP: 11 (ref 5–15)
BUN: 59 mg/dL — AB (ref 6–20)
CHLORIDE: 111 mmol/L (ref 101–111)
CO2: 18 mmol/L — ABNORMAL LOW (ref 22–32)
Calcium: 7.9 mg/dL — ABNORMAL LOW (ref 8.9–10.3)
Creatinine, Ser: 7.19 mg/dL — ABNORMAL HIGH (ref 0.44–1.00)
GFR calc non Af Amer: 6 mL/min — ABNORMAL LOW (ref >60)
GFR, EST AFRICAN AMERICAN: 7 mL/min — AB (ref >60)
Glucose, Bld: 150 mg/dL — ABNORMAL HIGH (ref 65–99)
POTASSIUM: 3.9 mmol/L (ref 3.5–5.1)
Sodium: 140 mmol/L (ref 135–145)

## 2017-12-30 LAB — PROCALCITONIN: PROCALCITONIN: 0.1 ng/mL

## 2017-12-30 LAB — C-REACTIVE PROTEIN: CRP: 1.7 mg/dL — ABNORMAL HIGH (ref <1.0)

## 2017-12-30 LAB — HEMOGLOBIN A1C
Hgb A1c MFr Bld: 7.4 % — ABNORMAL HIGH (ref 4.8–5.6)
MEAN PLASMA GLUCOSE: 165.68 mg/dL

## 2017-12-30 LAB — I-STAT TROPONIN, ED: TROPONIN I, POC: 0.05 ng/mL (ref 0.00–0.08)

## 2017-12-30 LAB — TSH: TSH: 1.977 u[IU]/mL (ref 0.350–4.500)

## 2017-12-30 MED ORDER — ZOLPIDEM TARTRATE 5 MG PO TABS: 5.0000 mg | ORAL_TABLET | Freq: Every evening | ORAL | Status: DC | PRN

## 2017-12-30 MED ORDER — ALBUTEROL SULFATE (2.5 MG/3ML) 0.083% IN NEBU
5.0000 mg | INHALATION_SOLUTION | Freq: Once | RESPIRATORY_TRACT | Status: AC
  Administered 2017-12-30: 5 mg via RESPIRATORY_TRACT
  Filled 2017-12-30: qty 6

## 2017-12-30 MED ORDER — HYDRALAZINE HCL 25 MG PO TABS
50.0000 mg | ORAL_TABLET | Freq: Once | ORAL | Status: DC
  Filled 2017-12-30: qty 2

## 2017-12-30 MED ORDER — FUROSEMIDE 10 MG/ML IJ SOLN: 80.0000 mg | Freq: Two times a day (BID) | INTRAMUSCULAR | Status: DC

## 2017-12-30 MED ORDER — LABETALOL HCL 200 MG PO TABS: 400.0000 mg | ORAL_TABLET | Freq: Two times a day (BID) | ORAL | Status: DC

## 2017-12-30 MED ORDER — LABETALOL HCL 5 MG/ML IV SOLN
20.0000 mg | Freq: Once | INTRAVENOUS | Status: AC
  Administered 2017-12-30: 20 mg via INTRAVENOUS
  Filled 2017-12-30: qty 4

## 2017-12-30 MED ORDER — SODIUM CHLORIDE 0.9 % IV SOLN
250.0000 mL | INTRAVENOUS | Status: DC | PRN
  Administered 2018-01-04: 07:00:00 via INTRAVENOUS

## 2017-12-30 MED ORDER — LABETALOL HCL 5 MG/ML IV SOLN
40.0000 mg | Freq: Once | INTRAVENOUS | Status: DC
  Filled 2017-12-30: qty 8

## 2017-12-30 MED ORDER — IPRATROPIUM-ALBUTEROL 0.5-2.5 (3) MG/3ML IN SOLN
3.0000 mL | Freq: Four times a day (QID) | RESPIRATORY_TRACT | Status: DC
  Administered 2017-12-30 – 2018-01-04 (×16): 3 mL via RESPIRATORY_TRACT
  Filled 2017-12-30 (×19): qty 3

## 2017-12-30 MED ORDER — CAMPHOR-MENTHOL 0.5-0.5 % EX LOTN: 1.0000 "application " | TOPICAL_LOTION | Freq: Three times a day (TID) | CUTANEOUS | Status: DC | PRN

## 2017-12-30 MED ORDER — DOCUSATE SODIUM 283 MG RE ENEM: 1.0000 | ENEMA | RECTAL | Status: DC | PRN

## 2017-12-30 MED ORDER — FUROSEMIDE 10 MG/ML IJ SOLN
80.0000 mg | Freq: Once | INTRAMUSCULAR | Status: DC
  Filled 2017-12-30: qty 8

## 2017-12-30 MED ORDER — ONDANSETRON HCL 4 MG/2ML IJ SOLN
INTRAMUSCULAR | Status: AC
  Administered 2017-12-30: 2 mg
  Filled 2017-12-30: qty 2

## 2017-12-30 MED ORDER — IPRATROPIUM-ALBUTEROL 0.5-2.5 (3) MG/3ML IN SOLN
3.0000 mL | Freq: Once | RESPIRATORY_TRACT | Status: AC
  Administered 2017-12-30: 3 mL via RESPIRATORY_TRACT

## 2017-12-30 MED ORDER — LABETALOL HCL 5 MG/ML IV SOLN
10.0000 mg | Freq: Four times a day (QID) | INTRAVENOUS | Status: DC
  Administered 2017-12-31 (×2): 10 mg via INTRAVENOUS
  Filled 2017-12-30 (×2): qty 4

## 2017-12-30 MED ORDER — FUROSEMIDE 10 MG/ML IJ SOLN: 40.0000 mg | Freq: Once | INTRAMUSCULAR | Status: DC

## 2017-12-30 MED ORDER — LABETALOL HCL 200 MG PO TABS
400.0000 mg | ORAL_TABLET | Freq: Once | ORAL | Status: DC
  Filled 2017-12-30: qty 2

## 2017-12-30 MED ORDER — SODIUM CHLORIDE 0.9% FLUSH
3.0000 mL | Freq: Two times a day (BID) | INTRAVENOUS | Status: DC
  Administered 2017-12-30 – 2018-01-04 (×10): 3 mL via INTRAVENOUS

## 2017-12-30 MED ORDER — LABETALOL HCL 5 MG/ML IV SOLN: 20.0000 mg | Freq: Four times a day (QID) | INTRAVENOUS | Status: DC

## 2017-12-30 MED ORDER — NEPRO/CARBSTEADY PO LIQD: 237.0000 mL | Freq: Three times a day (TID) | ORAL | Status: DC | PRN

## 2017-12-30 MED ORDER — FUROSEMIDE 10 MG/ML IJ SOLN: 40.0000 mg | Freq: Two times a day (BID) | INTRAMUSCULAR | Status: DC

## 2017-12-30 MED ORDER — HYDROXYZINE HCL 25 MG PO TABS: 25.0000 mg | ORAL_TABLET | Freq: Three times a day (TID) | ORAL | Status: DC | PRN

## 2017-12-30 MED ORDER — SORBITOL 70 % SOLN
30.0000 mL | Status: DC | PRN
  Filled 2017-12-30: qty 30

## 2017-12-30 MED ORDER — HYDRALAZINE HCL 20 MG/ML IJ SOLN
20.0000 mg | INTRAMUSCULAR | Status: DC
  Administered 2017-12-30: 20 mg via INTRAVENOUS
  Filled 2017-12-30: qty 1

## 2017-12-30 MED ORDER — HEPARIN SODIUM (PORCINE) 5000 UNIT/ML IJ SOLN
5000.0000 [IU] | Freq: Three times a day (TID) | INTRAMUSCULAR | Status: DC
  Administered 2017-12-30 – 2018-01-04 (×15): 5000 [IU] via SUBCUTANEOUS
  Filled 2017-12-30 (×15): qty 1

## 2017-12-30 MED ORDER — FAMOTIDINE IN NACL 20-0.9 MG/50ML-% IV SOLN
20.0000 mg | Freq: Two times a day (BID) | INTRAVENOUS | Status: DC
  Administered 2017-12-30 – 2017-12-31 (×2): 20 mg via INTRAVENOUS
  Filled 2017-12-30 (×3): qty 50

## 2017-12-30 MED ORDER — ONDANSETRON HCL 4 MG/2ML IJ SOLN: 4.0000 mg | Freq: Four times a day (QID) | INTRAMUSCULAR | Status: DC | PRN

## 2017-12-30 MED ORDER — FUROSEMIDE 10 MG/ML IJ SOLN
100.0000 mg | Freq: Two times a day (BID) | INTRAMUSCULAR | Status: DC
  Administered 2017-12-30 – 2017-12-31 (×2): 100 mg via INTRAVENOUS
  Filled 2017-12-30 (×2): qty 12

## 2017-12-30 MED ORDER — SODIUM CHLORIDE 0.9% FLUSH: 3.0000 mL | INTRAVENOUS | Status: DC | PRN

## 2017-12-30 MED ORDER — ACETAMINOPHEN 325 MG PO TABS
650.0000 mg | ORAL_TABLET | ORAL | Status: DC | PRN
  Administered 2017-12-31: 650 mg via ORAL
  Filled 2017-12-30: qty 2

## 2017-12-30 MED ORDER — FUROSEMIDE 10 MG/ML IJ SOLN: 100.0000 mg | Freq: Two times a day (BID) | INTRAMUSCULAR | Status: DC

## 2017-12-30 MED ORDER — CALCIUM CARBONATE ANTACID 1250 MG/5ML PO SUSP: 500.0000 mg | Freq: Four times a day (QID) | ORAL | Status: DC | PRN

## 2017-12-30 MED ORDER — HYDRALAZINE HCL 25 MG PO TABS: 50.0000 mg | ORAL_TABLET | Freq: Three times a day (TID) | ORAL | Status: DC

## 2017-12-30 NOTE — Consult Note (Signed)
Cardiology Consultation:   Campbell ID: Pamela Campbell; [id number]; 05/23/1961   Admit date: 12/30/2017 Date of Consult: 12/30/2017  Primary Care Provider: Dineen Kid, MD Primary Cardiologist: Skeet Latch, MD  Campbell Profile:   Pamela Campbell is a 57 y.o. female with a hx of chronic diastolic HF, mild nonobstructive CAD by cath in 2017, HTN, HLD, type 2 DM, stage III CKD and h/o medication noncompliance, who is being seen today for Pamela evaluation of acute on chronic diastolic HF in Pamela setting of hypertensive urgency, at Pamela request of Dr. Melina Copa, [facility] ED.   History of Present Illness:   Pt first seen by [place] in 2016 when she presented with hypertensive urgency. BP was markedly elevated in Pamela setting of medication noncompliance. She was placed on antihypertensive regimen and BP improved. 2D echo in 2016 showed normal LVEF at 50-55% with G2DD.   She was readmitted again in 2017 with CP and acute on chronic diastolic HF. She had a NST that was abnormal, leading to [facility] on 11/02/16. She was noted to have mild nonobstructive CAD and elevated LVEDP. Medical therapy for diastolic HF was recommended and Lasix, 40 mg was added to her regimen. Her stress test was a false positive study related to attenuation artifact. Since then, her diuretic has been changed to torsemide.   She has had ongoing issues with medication compliance resulting in poorly controlled HTN. Given her CKD, she has been followed by Dr. Florene Glen but admits that she has not been c/w follow-up. Her baseline SCr is ~7.0 and she will ultimately need HD.   Pt now presents to [facility] ED with a complaint of dyspnea, cough and malaise. She initially thought she had Pamela flu (she has felt poorly since christmas and has gradually gotten worse). She notes that she did get a flu shot this season. She initially went to her PCP office and was sent to [facility] ED and noted to be markedly hypertensive with SBPs in Pamela 200s, once again in Pamela  setting of medication noncompliance. Pt notes she ran out of her meds about 1 week ago.  BNP is abnormal at 446.8. CXR shows mild interstitial edema. No PNA. SCr is 7.19. BUN 59. K is 3.9. Hgb 9.5. WBC also elevated at 14.8. IV Lasix 100 mg BID has been ordered.   Past Medical History:  Diagnosis Date  . Chronic diastolic CHF (congestive heart failure) ([place])    a. 03/2015: echo w/ EF of 50-55%, no WMA, Grade 2 DD, trivial AI, mild MR.  . Diabetes mellitus without complication ([place])   . Hypertension     Past Surgical History:  Procedure Laterality Date  . CARDIAC CATHETERIZATION N/A 11/02/2016   Procedure: Left Heart Cath and Coronary Angiography;  Surgeon: Sherren Mocha, MD;  Location: [place] [facility];  Service: Cardiovascular;  Laterality: N/A;  . CESAREAN SECTION    . FRACTURE SURGERY    . TUBAL LIGATION       Home Medications:  Prior to Admission medications   Medication Sig Start Date End Date Taking? Authorizing Provider  calcitRIOL (ROCALTROL) 0.25 MCG capsule Take 0.25 capsules by mouth daily. 09/13/17  Yes [provider]  hydrALAZINE (APRESOLINE) 50 MG tablet Take 1 tablet (50 mg total) by mouth 3 (three) times daily. 10/15/17  Yes Almyra Deforest, PA  labetalol (NORMODYNE) 200 MG tablet Take 400 mg by mouth 2 (two) times daily.   Yes [provider]  ondansetron (ZOFRAN-ODT) 4 MG disintegrating tablet  Take 1 tablet (4 mg total) by mouth every 6 (six) hours as needed for nausea or vomiting. 10/12/17  Yes Helberg, Larkin Ina, MD  torsemide (DEMADEX) 20 MG tablet Take 40 mg by mouth 2 (two) times daily.  09/04/17  Yes [provider]    Inpatient Medications: Scheduled Meds: . furosemide  80 mg Intravenous Once  . labetalol  40 mg Intravenous Once   Continuous Infusions:  PRN Meds:   Allergies:    Allergies  Allergen Reactions  . Compazine [Prochlorperazine Edisylate] Swelling  . Prochlorperazine Swelling    Tongue swelling   . Vioxx [Rofecoxib]  Other (See Comments)    Other reaction(s): Bleeding (intolerance) Bleeding    Social History:   Social History   Socioeconomic History  . Marital status: Married    Spouse name: Not on file  . Number of children: Not on file  . Years of education: Not on file  . Highest education level: Not on file  Social Needs  . Financial resource strain: Not on file  . Food insecurity - worry: Not on file  . Food insecurity - inability: Not on file  . Transportation needs - medical: Not on file  . Transportation needs - non-medical: Not on file  Occupational History  . Not on file  Tobacco Use  . Smoking status: Former Smoker    Last attempt to quit: 03/22/1995    Years since quitting: 22.7  . Smokeless tobacco: Never Used  Substance and Sexual Activity  . Alcohol use: No  . Drug use: No  . Sexual activity: No    Birth control/protection: None  Other Topics Concern  . Not on file  Social History Narrative  . Not on file    Family History:    Family History  Problem Relation Age of Onset  . Diabetes Mellitus II Mother   . Hypertension Mother   . Hyperlipidemia Mother   . Kidney disease Mother   . Heart disease Father   . Heart attack Father 71  . Congestive Heart Failure Brother   . Congestive Heart Failure Son      ROS:  Please see Pamela history of present illness.  ROS  All other ROS reviewed and negative.     Physical Exam/Data:   Vitals:   12/30/17 1330 12/30/17 1345 12/30/17 1400 12/30/17 1500  BP: (!) 228/111 (!) 234/102 (!) 220/108 (!) 200/95  Pulse: 94 99 92 77  Resp: (!) 21 (!) 21 20 16   Temp:      TempSrc:      SpO2: 100% 99% 100% 100%  Weight:       No intake or output data in Pamela 24 hours ending 12/30/17 1544 Filed Weights   12/30/17 1304  Weight: 269 lb (122 kg)   Body mass index is 49.2 kg/m.  General:  Well nourished, well developed, in no acute distress, morbidly obesee HEENT: normal Lymph: no adenopathy Neck: no JVD Endocrine:  No  thryomegaly Vascular: No carotid bruits; FA pulses 2+ bilaterally without bruits  Cardiac:  normal S1, S2; RRR; no murmur  Lungs:  Diffuse expiratory wheezing bilaterally Abd: soft, nontender, no hepatomegaly  Ext: tense bilateral LEE  Musculoskeletal:  No deformities, BUE and BLE strength normal and equal Skin: warm and dry  Neuro:  CNs 2-12 intact, no focal abnormalities noted Psych:  Normal affect   EKG:  Pamela EKG was personally reviewed and demonstrates:  NSR with prolonged QT at 401/512 ms Telemetry:  Telemetry was personally  reviewed and demonstrates:  NSR  Relevant CV Studies:  LHC 10/2016 Coronary Findings   Diagnostic  Dominance: Right  Left Main  Vessel is angiographically normal.  Left Anterior Descending  Prox LAD lesion 20% stenosed  There is very mild narrowing at Pamela LAD ostium compared with Pamela large caliber of Pamela proximal LAD. There are no flow obstructive lesions.  Left Circumflex  Mid Cx lesion 30% stenosed  Mid Cx lesion.  Right Coronary Artery  Large, dominant vessel. Pamela vessel gives off several branches to Pamela PDA and PLA, both of which supply a large territory. Pamela vessel is smooth and appears angiographically normal.   Conclusion  1. Mild nonobstructive coronary artery disease 2. Elevated LVEDP   2D Echo 10/27/2015 Study Conclusions  - Left ventricle: Pamela cavity size was normal. Wall thickness was   increased in a pattern of moderate LVH. Systolic function was   normal. Pamela estimated ejection fraction was in Pamela range of 55%   to 60%. Wall motion was normal; there were no regional wall   motion abnormalities. Features are consistent with a pseudonormal   left ventricular filling pattern, with concomitant abnormal   relaxation and increased filling pressure (grade 2 diastolic   dysfunction). - Aortic valve: Trileaflet; mildly thickened, mildly calcified   leaflets. - Mitral valve: There was trivial regurgitation. - Left atrium: Pamela atrium was  mildly dilated.  Laboratory Data:  Chemistry Recent Labs  Lab 12/30/17 1400  NA 140  K 3.9  CL 111  CO2 18*  GLUCOSE 150*  BUN 59*  CREATININE 7.19*  CALCIUM 7.9*  GFRNONAA 6*  GFRAA 7*  ANIONGAP 11    No results for input(s): PROT, ALBUMIN, AST, ALT, ALKPHOS, BILITOT in Pamela last 168 hours. Hematology Recent Labs  Lab 12/30/17 1400  WBC 14.8*  RBC 3.65*  HGB 9.5*  HCT 30.5*  MCV 83.6  MCH 26.0  MCHC 31.1  RDW 15.3  PLT 324   Cardiac EnzymesNo results for input(s): TROPONINI in Pamela last 168 hours.  Recent Labs  Lab 12/30/17 1414  TROPIPOC 0.05    BNP Recent Labs  Lab 12/30/17 1400  BNP 446.8*    DDimer No results for input(s): DDIMER in Pamela last 168 hours.  Radiology/Studies:  Dg Chest 2 View  Result Date: 12/30/2017 CLINICAL DATA:  Two weeks of shortness of breath and cough. History of CHF, hypertension, diabetes former smoker. EXAM: CHEST  2 VIEW COMPARISON:  Chest x-ray of December 08, 2017 FINDINGS: Pamela lungs are well-expanded. Pamela interstitial markings remain increased. Pamela pulmonary vascularity while in [place] is not as conspicuous as on Pamela previous study. Pamela cardiac silhouette remains enlarged. There is no significant pleural effusion. Pamela observed bony thorax is unremarkable. IMPRESSION: CHF with mild interstitial edema not as conspicuous as on Pamela previous study. No acute pneumonia. Electronically Signed   By: David  Martinique M.D.   On: 12/30/2017 13:48    Assessment and Plan:   1. Hypertensive Urgency: BP markedly elevated in Pamela 616W systolic/ low 737T diastolic, in Pamela setting of medication noncompliance. Multiple admissions for similar presentations in Pamela past. IV Lasix, IV hydralazine and IV labetalol have already been ordered by ED physician to give. Monitor closely. Consider addition of amlodipine. Also recommend continuing labetalol BID long term. Hydralazine not Pamela best option given TID dosing and compliance issues.   2. Acute on  Chronic Diastolic CHF: pt with dyspnea, abnormal BNP at 446 and CXR consistent with mild edema, in Pamela setting  of hypertensive urgency. IV Lasix ordered, 100 mg BID (home regimen = Torsemide 40 mg BID). Monitor renal function closely given CKD. Monitor response to diuretics through strict I/Os and daily weights. Also monitor and keep K stable. Low sodium diet. Last echo was in 2017. Recommend repeating echo to see if any significant changes.   3. CKD: baseline SCr is ~7. She has been followed by Dr. Florene Glen in Pamela past but has not had recent f/u. She will need to reestablish care with nephrology.   4. DM: management per IM.   5. Anemia: hgb is 9.5 c/w baseline. Suspect anemia of chronic disease 2/2 CKD.    6. Medication Noncompliance: examine for any barriers than may affect compliance. ? Cost and need for any SW/ medication assistance as well as pt eduction.   7. Leukocytosis: WBC elevated at 14. However pt is afebrile. CXR w/o evidence of PNA. Further monitoring and w/u per IM.      For questions or updates, please contact [place] Please consult [email or web address] for contact info under Cardiology/STEMI.   Signed, Lyda Jester, PA-C  12/30/2017 3:44 PM   History and all data above reviewed.  Campbell examined.  I agree with Pamela findings as above.  Pamela Campbell presents with dyspnea that has been going on for several days.  She says that she felt like she had Pamela flu.  She had cough without significant sputum production.  She did not have fever or chills.  She did not have chest pain or palpitations. She says she has lost weight and has had less swelling in her legs.  However, she has had PND and orthopnea.  She presented to her PCP who sent her to [facility] ED.  She has significant HTN as she did not take her BP meds.  She has not followed up with her nephrologist although she has a creat greater than 7.  She does avoid salt she says.   Pamela Campbell exam reveals COR:RRR  ,  Lungs: Diffuse wheezing   ,  Abd: Positive bowel sounds, no rebound no guarding, Ext Mild to moderate leg edema.  .  All available labs, radiology testing, previous records reviewed. Agree with documented assessment and plan. Acute SOB:  This is likely multifactorial with a component of acute on chronic diastolic dysfunction.  She needs to have attempts at diuresis and might need dialysis if she does not have good urine output or her creat rises.  She needs to resume BP meds.  I would try to simplify Pamela regimen.  I will stop hydralazine and continue beta blocker and start Norvasc.  She needs compliance with follow up and to establish with renal.  (She wants to go to [facility]).  We can cycle enzymes.   Repeat echo.  We will follow.   Jeneen Rinks Selwyn Reason  5:35 PM  12/30/2017

## 2017-12-30 NOTE — ED Notes (Signed)
Pt at side of bed to use bedside commode and stated she was dizzy. Checked BP manually and it was 92/48. EDP ordered 500 bolus and zofran for nausea. See mar. Paged Lissa Merlin, NP and DC hydralazine per order. Staff to call for BP meds overnight

## 2017-12-30 NOTE — ED Notes (Signed)
Admitting paged due to pt lung sounds, admitting ordered breathing treatment and was also told about pt bp and d/c the order for the labetalol.

## 2017-12-30 NOTE — ED Provider Notes (Signed)
[facility] EMERGENCY DEPARTMENT Provider Note   CSN: [id number] Arrival date & time: 12/30/17  1259     History   Chief Complaint Chief Complaint  Patient presents with  . Shortness of Breath  . Hypertension    HPI Pamela Campbell is a 57 y.o. female.   The history is provided by the patient.   Shortness of Breath   This is a new problem. The problem occurs continuously.The problem has not changed since onset.Associated symptoms include cough, sputum production, wheezing, PND, orthopnea and leg swelling. Pertinent negatives include no fever, no headaches, no sore throat, no ear pain, no hemoptysis, no chest pain, no syncope, no vomiting, no abdominal pain, no rash and no leg pain. The problem's precipitants include medical treatment. Associated medical issues include heart failure.   Hypertension   Associated symptoms include shortness of breath. Pertinent negatives include no chest pain, no abdominal pain and no headaches.    Hypertensive urgency and CHF34 year old female with history of and a recent admission for same here with increased shortness of breath and cough since Christmas.  Patient states since then her symptoms have waxed and waned and actually today is not the worst day but she went to the clinic where they transferred here for further evaluation.  She said she had a nebulizer in route.  She denies any chest pain she is complaining of cough that is somewhat productive of sputum she denies fever.  She states she is actually lost some weight in the peripheral edema on her legs is better than usual.  She has been taking her regular medicines up until a week ago when she ran out and she is taken out of her blood pressure meds her diuretics since then.   Past Medical History:  Diagnosis Date  . Chronic diastolic CHF (congestive heart failure) ([place])    a. 03/2015: echo w/ EF of 50-55%, no WMA, Grade 2 DD, trivial AI, mild MR.  . Diabetes mellitus without  complication ([place])   . Hypertension     Patient Active Problem List   Diagnosis Date Noted  . Hypertensive urgency 10/11/2017  . Hepatitis C antibody test positive 12/08/2016  . Minor CAD    . Abnormal stress test   . Essential hypertension   . Acute kidney injury ([place])   . Unstable angina pectoris ([place])   . Hypertensive emergency 10/24/2016  . Chest pain with high risk for cardiac etiology 10/24/2016  . Acute-on-chronic kidney injury ([place]) 10/24/2016  . CKD (chronic kidney disease) stage 3, GFR 30-59 ml/min ([facility]) 07/17/2015  . HLD (hyperlipidemia) 07/17/2015  . Type 2 diabetes mellitus ([place]) 03/27/2015  . Chronic diastolic congestive heart failure ([place]) 03/25/2015  . Accelerated hypertension 03/25/2015  . Obesity BMI 45 03/25/2015  . Sleep apnea 03/25/2015  . Dyslipidemia 03/25/2015  . Elevated troponin-demand ischemia 03/24/2015  . Hypertensive heart disease 03/24/2015  . Shortness of breath 03/24/2015    Past Surgical History:  Procedure Laterality Date  . CARDIAC CATHETERIZATION N/A 11/02/2016   Procedure: Left Heart Cath and Coronary Angiography;  Surgeon: Sherren Mocha, MD;  Location: [facility];  Service: Cardiovascular;  Laterality: N/A;  . CESAREAN SECTION    . FRACTURE SURGERY    . TUBAL LIGATION      OB History    No data available       Home Medications    Prior to Admission medications   Medication Sig Start Date End Date Taking? Authorizing Provider  calcitRIOL (ROCALTROL) 0.25 MCG capsule Take 0.25 capsules by mouth daily. 09/13/17   [provider]  hydrALAZINE (APRESOLINE) 50 MG tablet Take 1 tablet (50 mg total) by mouth 3 (three) times daily. 10/15/17   Almyra Deforest, PA  labetalol (NORMODYNE) 200 MG tablet Take 400 mg by mouth 2 (two) times daily.    [provider]  ondansetron (ZOFRAN-ODT) 4 MG disintegrating tablet Take 1 tablet (4 mg total) by mouth every 6 (six) hours as needed for nausea or vomiting. 10/12/17    Ina Homes, MD  torsemide (DEMADEX) 20 MG tablet Take 40 mg by mouth 2 (two) times daily.  09/04/17   [provider]    Family History Family History  Problem Relation Age of Onset  . Diabetes Mellitus II Mother   . Hypertension Mother   . Hyperlipidemia Mother   . Kidney disease Mother   . Heart disease Father   . Heart attack Father 75  . Congestive Heart Failure Brother   . Congestive Heart Failure Son     Social History Social History   Tobacco Use  . Smoking status: Former Smoker    Last attempt to quit: 03/22/1995    Years since quitting: 22.7  . Smokeless tobacco: Never Used  Substance Use Topics  . Alcohol use: No  . Drug use: No     Allergies   Compazine [prochlorperazine edisylate]; Prochlorperazine; and Vioxx [rofecoxib]   Review of Systems Review of Systems  Constitutional: Negative for chills and fever.  HENT: Negative for ear pain and sore throat.   Eyes: Negative for pain and visual disturbance.  Respiratory: Positive for cough, sputum production, shortness of breath and wheezing. Negative for hemoptysis.   Cardiovascular: Positive for orthopnea, leg swelling and PND. Negative for chest pain, palpitations and syncope.  Gastrointestinal: Positive for blood in stool. Negative for abdominal pain and vomiting.  Genitourinary: Negative for dysuria and hematuria.  Musculoskeletal: Negative for arthralgias and back pain.  Skin: Negative for color change and rash.  Neurological: Negative for seizures, syncope and headaches.  Hematological: Negative for adenopathy.  All other systems reviewed and are negative.    Physical Exam Updated Vital Signs BP (!) 230/115 (BP Location: Right Wrist)   Pulse 95   Temp 98.7 F (37.1 C) (Oral)   Resp (!) 22   Wt 122 kg (269 lb)   LMP 02/02/2015   SpO2 96%   BMI 49.20 kg/m    Physical Exam  Constitutional: She appears well-developed and well-nourished. No distress.  HENT:  Head: Normocephalic and  atraumatic.  Mouth/Throat: Oropharynx is clear and moist.  Eyes: Conjunctivae and EOM are normal. Pupils are equal, round, and reactive to light.  Neck: Neck supple.  Cardiovascular: Normal rate and regular rhythm.  No murmur heard. Pulmonary/Chest: Effort normal. No respiratory distress. She has wheezes. She has rales. She exhibits no tenderness and no crepitus.  Abdominal: Soft. She exhibits no mass. There is no tenderness.  Musculoskeletal: Normal range of motion.       Right lower leg: She exhibits edema. She exhibits no tenderness.       Left lower leg: She exhibits edema. She exhibits no tenderness.  Neurological: She is alert.  Skin: Skin is warm and dry.  Psychiatric: She has a normal mood and affect.  Nursing note and vitals reviewed.    ED Treatments / Results  Labs (all labs ordered are listed, but only abnormal results are displayed) Labs Reviewed  CBC WITH DIFFERENTIAL/PLATELET  BASIC  METABOLIC PANEL  BRAIN NATRIURETIC PEPTIDE  I-STAT TROPONIN, ED    EKG  EKG Interpretation  Date/Time:  Thursday December 30 2017 13:30:11 EST Ventricular Rate:  98 PR Interval:    QRS Duration: 102 QT Interval:  401 QTC Calculation: 512 R Axis:   46 Text Interpretation:  Sinus rhythm Prolonged QT interval no acute st/ts similar to prior 12/18 Confirmed by Aletta Edouard [phone]) on 12/30/2017 1:41:03 PM Also confirmed by Aletta Edouard [phone]), editor Philomena Doheny [phone])  on 12/30/2017 3:17:01 PM       Radiology Dg Chest 2 View  Result Date: 12/30/2017 CLINICAL DATA:  Two weeks of shortness of breath and cough. History of CHF, hypertension, diabetes former smoker. EXAM: CHEST  2 VIEW COMPARISON:  Chest x-ray of December 08, 2017 FINDINGS: The lungs are well-expanded. The interstitial markings remain increased. The pulmonary vascularity while in [place] is not as conspicuous as on the previous study. The cardiac silhouette remains enlarged. There is no significant pleural  effusion. The observed bony thorax is unremarkable. IMPRESSION: CHF with mild interstitial edema not as conspicuous as on the previous study. No acute pneumonia. Electronically Signed   By: David  Martinique M.D.   On: 12/30/2017 13:48    Procedures Procedures (including critical care time)  Medications Ordered in ED Medications  albuterol (PROVENTIL) (2.5 MG/3ML) 0.083% nebulizer solution 5 mg (not administered)  hydrALAZINE (APRESOLINE) tablet 50 mg (not administered)  labetalol (NORMODYNE) tablet 400 mg (not administered)     Initial Impression / Assessment and Plan / ED Course  I have reviewed the triage vital signs and the nursing notes.  Pertinent labs & imaging results that were available during my care of the patient were reviewed by me and considered in my medical decision making (see chart for details).  Clinical Course as of Jan 01 802  Thu Dec 30, 2017  1357 Getting neb. Feeling slightly better.   [MB]  1402 ECHO 11/17 - Left ventricle: The cavity size was normal. Wall thickness was   increased in a pattern of moderate LVH. Systolic function was   normal. The estimated ejection fraction was in the range of 55%   to 60%. Wall motion was normal; there were no regional wall   motion abnormalities. Features are consistent with a pseudonormal   left ventricular filling pattern, with concomitant abnormal   relaxation and increased filling pressure (grade 2 diastolic   dysfunction). - Aortic valve: Trileaflet; mildly thickened, mildly calcified   leaflets. - Mitral valve: There was trivial regurgitation. - Left atrium: The atrium was mildly dilated.   [MB]  2376 Cath 11/17 - nonobstructive CAD  [MB]  1417 Patient refusing to take her normal BP meds orally because they make her nauseous. I have ordered her iv labetalol now.   [MB]  2831 Reeval. BP 170/84 now, breathing comfortably on nasal canula.   [MB]  5176 I discussed with the hospitalist for admission and she is accepted  to their service for continued blood pressure control and diuresis.  [MB]    Clinical Course User Index [MB] Hayden Rasmussen, MD    58 year old female with known hypertension and CHF here after being off of her meds for a week due to the fact that she ran out.  She is mildly tachypneic coughing with diffuse wheezing and rhonchi.  She does have peripheral edema she says is better than normal.  Her chest x-ray looks like failure and her EKG is no obvious ischemia.  I have reordered  her oral blood pressure meds as her blood pressure is 230s over 100.  I do not think she needs a nitro drip at the moment because she is not in particular distress.  She will need diuresis but I am waiting for her renal function to come back.  She will need to be admitted to the hospital for further management of this.  CRITICAL CARE Performed by: Hayden Rasmussen   Total critical care time: 40 minutes  Critical care time was exclusive of separately billable procedures and treating other patients.  Critical care was necessary to treat or prevent imminent or life-threatening deterioration.  Critical care was time spent personally by me on the following activities: development of treatment plan with patient and/or surrogate as well as nursing, discussions with consultants, evaluation of patient's response to treatment, examination of patient, obtaining history from patient or surrogate, ordering and performing treatments and interventions, ordering and review of laboratory studies, ordering and review of radiographic studies, pulse oximetry and re-evaluation of patient's condition.  Patient requiring IV doses of medication to control her blood pressure and to diurese patient.  Patient is essentially hypertensive emergency CHF.  Final Clinical Impressions(s) / ED Diagnoses   Final diagnoses:  Hypertensive urgency  Hypertensive heart disease with congestive heart failure, unspecified heart failure type [facility])    ED  Discharge Orders    None      Hayden Rasmussen, MD 01/01/18 [phone]

## 2017-12-30 NOTE — H&P (Signed)
History and Physical    Pamela Campbell TMH:[id number] DOB: [birth year] DOA: 12/30/2017  **Will place patient in observation status based on the expectation that the patient will need hospitalization/ hospital care for </= 23 hours  PCP: Via, Lennette Bihari, MD   Attending physician: Evangeline Gula  Patient coming from/Resides with: Private residence  Chief Complaint: Shortness of breath and wheezing.  HPI: Pamela Campbell is a 57 y.o. female with medical history significant for poorly controlled hypertension for many years with history of medication noncompliance, history of diabetes mellitus currently does not appear to be on medications, chronic kidney disease that has rapidly progressed over the past 12 months and obesity.  She was recently discharged on 12/21 after an admission for hypertensive urgency that required transient usage of nicardipine drip.  Per her usual presentation she is noncompliant with her medications stating she did not feel well and was nauseous and unable to take her medications.  She was sent over from her PCP office today with reports of wheezing and shortness of breath.  She will presenting blood pressure was 284/150 and patient reported has not taken blood pressure medications in 1 week due to inability to procure/needed prescription called into pharmacy which was in process.  Patient also reports persistent nausea and difficulty tolerating medications when she does have them available.  Since arrival to ER patient's blood pressures been persistent divided with most recent reading 210/86 with doses of IV Lasix, IV labetalol and IV hydralazine pending.  In further discussion with the patient she has had several days of subjective fevers and chills with nasal congestion and cough with clear to yellow sputum.  Reports she did receive a flu shot this year.  Chest x-ray in the ER reveals HF with mild interstitial edema which is stable for her.  Renal function is slightly worse with a BUN  of 59 and a creatinine of 7.19 although in review of her labs over the past 12 months it was noted that in November 2017 her BUN was 16 and her creatinine was 1.84 with a GFR of 35.  In further discussion with the patient it was very difficult for her to tell me exactly which physician would be her ordering physician for her refills on her medications.  She reported that when she had run out of her medication she notified her local pharmacy who stated she had no more refills and apparently attempted to notify physicians who had previously ordered these medications either at [facility] or in [place].  At some point her current PCP was notified but the prescription had not been called and and patient finally went to see him today.  Although she would not tell me I suspect this was because he has not seen her in regards to prescribing this medication or regarding these problems and asked to see her before prescribing the medications.  Patient also becomes evasive when asked about long time compliance with her medications and my concerns that repeated noncompliance with her blood pressure medicines has led to her evolving renal failure.  It is at this point the patient tells me that she has been unhappy with the care that she has received from local nephrologists with her rationale being "my cardiologist told me how to take my heart and blood pressure medicines one way and they went and changed and wanted something different and they would not listen to me when I told him what my heart doctors had said."  In review of discharge summary from  previous admission patient had informed the critical care team that she wanted to follow-up with nephrology at [facility] and was instructed to seek an outpatient appointment but has yet to schedule this.  ED Course:  Vital Signs: BP (!) 210/86   Pulse 73   Temp 98.7 F (37.1 C) (Oral)   Resp (!) 22   Wt 122 kg (269 lb)   LMP 02/02/2015   SpO2 100%   BMI 49.20 kg/m  Chest  x-ray: As above Lab data: Sodium 140, potassium 3.9, chloride 111, CO2 18, glucose 150, BUN 59, creatinine 7.19, calcium 7.9, BNP 447, poc troponin 0 0.05, white count 14,800 with normal neutrophils elevated absolute neutrophils 11.2%, hemoglobin 9.5, platelets 324,000 Medications and treatments: As above per HPI  Review of Systems:  In addition to the HPI above,  No Headache, changes with Vision or hearing, new weakness, tingling, numbness in any extremity, dizziness, dysarthria or word finding difficulty, gait disturbance or imbalance, tremors or seizure activity No problems swallowing food or Liquids, indigestion/reflux, choking or coughing while eating, abdominal pain with or after eating No Chest pain, palpitations, orthopnea No Abdominal pain, melena,hematochezia, dark tarry stools, constipation No dysuria, malodorous urine, hematuria or flank pain No new skin rashes, lesions, masses or bruises, No new joint pains, aches, swelling or redness No recent unintentional weight gain or loss No polyuria, polydypsia or polyphagia   Past Medical History:  Diagnosis Date  . Chronic diastolic CHF (congestive heart failure) ([place])    a. 03/2015: echo w/ EF of 50-55%, no WMA, Grade 2 DD, trivial AI, mild MR.  . Diabetes mellitus without complication ([place])   . Hypertension     Past Surgical History:  Procedure Laterality Date  . CARDIAC CATHETERIZATION N/A 11/02/2016   Procedure: Left Heart Cath and Coronary Angiography;  Surgeon: Sherren Mocha, MD;  Location: [place] [facility];  Service: Cardiovascular;  Laterality: N/A;  . CESAREAN SECTION    . FRACTURE SURGERY    . TUBAL LIGATION      Social History   Socioeconomic History  . Marital status: Married    Spouse name: Not on file  . Number of children: Not on file  . Years of education: Not on file  . Highest education level: Not on file  Social Needs  . Financial resource strain: Not on file  . Food insecurity - worry: Not on  file  . Food insecurity - inability: Not on file  . Transportation needs - medical: Not on file  . Transportation needs - non-medical: Not on file  Occupational History  . Not on file  Tobacco Use  . Smoking status: Former Smoker    Last attempt to quit: 03/22/1995    Years since quitting: 22.7  . Smokeless tobacco: Never Used  Substance and Sexual Activity  . Alcohol use: No  . Drug use: No  . Sexual activity: No    Birth control/protection: None  Other Topics Concern  . Not on file  Social History Narrative  . Not on file    Mobility: Patient reports as independent Work history: Not obtained  Allergies  Allergen Reactions  . Compazine [Prochlorperazine Edisylate] Swelling  . Prochlorperazine Swelling    Tongue swelling   . Vioxx [Rofecoxib] Other (See Comments)    Other reaction(s): Bleeding (intolerance) Bleeding    Family History  Problem Relation Age of Onset  . Diabetes Mellitus II Mother   . Hypertension Mother   . Hyperlipidemia Mother   . Kidney  disease Mother   . Heart disease Father   . Heart attack Father 37  . Congestive Heart Failure Brother   . Congestive Heart Failure Son      Prior to Admission medications   Medication Sig Start Date End Date Taking? Authorizing Provider  calcitRIOL (ROCALTROL) 0.25 MCG capsule Take 0.25 capsules by mouth daily. 09/13/17  Yes [provider]  hydrALAZINE (APRESOLINE) 50 MG tablet Take 1 tablet (50 mg total) by mouth 3 (three) times daily. 10/15/17  Yes Almyra Deforest, PA  labetalol (NORMODYNE) 200 MG tablet Take 400 mg by mouth 2 (two) times daily.   Yes [provider]  ondansetron (ZOFRAN-ODT) 4 MG disintegrating tablet Take 1 tablet (4 mg total) by mouth every 6 (six) hours as needed for nausea or vomiting. 10/12/17  Yes Helberg, Larkin Ina, MD  torsemide (DEMADEX) 20 MG tablet Take 40 mg by mouth 2 (two) times daily.  09/04/17  Yes [provider]    Physical Exam: Vitals:   12/30/17 1615  12/30/17 1630 12/30/17 1645 12/30/17 1700  BP: (!) 202/100 (!) 220/92 (!) 199/101 (!) 210/86  Pulse: 81 82 76 73  Resp: 18 (!) 21 (!) 23 (!) 22  Temp:      TempSrc:      SpO2: 100% 99% 100% 100%  Weight:          Constitutional: NAD, calm, comfortable Eyes: PERRL, lids and conjunctivae normal ENMT: Mucous membranes are moist. Posterior pharynx clear of any exudate or lesions. Poor dentition.  Neck: normal, supple, no masses, no thyromegaly Respiratory: Worse to auscultation bilaterally some expiratory wheezing wheezing, no crackles. Normal respiratory effort. No accessory muscle use.  Nasal cannula oxygen Cardiovascular: Regular rate and rhythm, no murmurs / rubs / gallops.  Chronic nonpitting bilateral lower extremity edema. 2+ pedal pulses. No carotid bruits.  Abdomen: no tenderness, no masses palpated. No hepatosplenomegaly. Bowel sounds positive.  Musculoskeletal: no clubbing / cyanosis. No joint deformity upper and lower extremities. Good ROM, no contractures. Normal muscle tone.  Skin: no rashes, lesions, ulcers. No induration Neurologic: CN 2-12 grossly intact. Sensation intact, DTR normal. Strength 5/5 x all 4 extremities.  Psychiatric: Alert and oriented x 3. Normal mood.    Labs on Admission: I have personally reviewed following labs and imaging studies  CBC: Recent Labs  Lab 12/30/17 1400  WBC 14.8*  NEUTROABS 11.2*  HGB 9.5*  HCT 30.5*  MCV 83.6  PLT 829   Basic Metabolic Panel: Recent Labs  Lab 12/30/17 1400  NA 140  K 3.9  CL 111  CO2 18*  GLUCOSE 150*  BUN 59*  CREATININE 7.19*  CALCIUM 7.9*   GFR: Estimated Creatinine Clearance: 11 mL/min (A) (by C-G formula based on SCr of 7.19 mg/dL (H)). Liver Function Tests: No results for input(s): AST, ALT, ALKPHOS, BILITOT, PROT, ALBUMIN in the last 168 hours. No results for input(s): LIPASE, AMYLASE in the last 168 hours. No results for input(s): AMMONIA in the last 168 hours. Coagulation Profile: No  results for input(s): INR, PROTIME in the last 168 hours. Cardiac Enzymes: No results for input(s): CKTOTAL, CKMB, CKMBINDEX, TROPONINI in the last 168 hours. BNP (last 3 results) No results for input(s): PROBNP in the last 8760 hours. HbA1C: No results for input(s): HGBA1C in the last 72 hours. CBG: No results for input(s): GLUCAP in the last 168 hours. Lipid Profile: No results for input(s): CHOL, HDL, LDLCALC, TRIG, CHOLHDL, LDLDIRECT in the last 72 hours. Thyroid Function Tests: No results for  input(s): TSH, T4TOTAL, FREET4, T3FREE, THYROIDAB in the last 72 hours. Anemia Panel: No results for input(s): VITAMINB12, FOLATE, FERRITIN, TIBC, IRON, RETICCTPCT in the last 72 hours. Urine analysis:    Component Value Date/Time   COLORURINE YELLOW 10/26/2016 1518   APPEARANCEUR CLEAR 10/26/2016 1518   LABSPEC 1.026 10/26/2016 1518   PHURINE 5.5 10/26/2016 1518   GLUCOSEU 250 (A) 10/26/2016 1518   HGBUR TRACE (A) 10/26/2016 1518   BILIRUBINUR NEGATIVE 10/26/2016 1518   KETONESUR NEGATIVE 10/26/2016 1518   PROTEINUR >300 (A) 10/26/2016 1518   UROBILINOGEN 0.2 03/24/2015 0037   NITRITE NEGATIVE 10/26/2016 1518   LEUKOCYTESUR NEGATIVE 10/26/2016 1518   Sepsis Labs: @LABRCNTIP (procalcitonin:4,lacticidven:4) )No results found for this or any previous visit (from the past 240 hour(s)).   Radiological Exams on Admission: Dg Chest 2 View  Result Date: 12/30/2017 CLINICAL DATA:  Two weeks of shortness of breath and cough. History of CHF, hypertension, diabetes former smoker. EXAM: CHEST  2 VIEW COMPARISON:  Chest x-ray of December 08, 2017 FINDINGS: The lungs are well-expanded. The interstitial markings remain increased. The pulmonary vascularity while in [place] is not as conspicuous as on the previous study. The cardiac silhouette remains enlarged. There is no significant pleural effusion. The observed bony thorax is unremarkable. IMPRESSION: CHF with mild interstitial edema not as  conspicuous as on the previous study. No acute pneumonia. Electronically Signed   By: David  Martinique M.D.   On: 12/30/2017 13:48    EKG: (Independently reviewed) normal sinus rhythm with ventricular rate 88 bpm, QTC 512, normal R wave rotation no acute ischemic changes  Assessment/Plan Principal Problem:   Acute on chronic diastolic heart failure  -Presents with shortness of breath and wheezing in the context of uncontrolled hypertension and involving chronic kidney disease with chest x-ray evidence of CHF -In review of outpatient documentation from October 2018 nephrology had increased the patient's furosemide to 100 twice daily due to her low GFR she reports feeling very poorly after taking the first dose therefore cardiology continue her on 40 mg twice a day-I suspect given her low GFR that at least temporarily in the hospital she will require high dose IV Lasix twice daily so we will give 100 mg at this juncture -Daily weights, strict I's/O -Ongoing nausea so we will utilize beta-blocker as well as hydralazine IV -No ACE inhibitor/ARB due to CKD -Last echocardiogram: November 2017 preserved LV function but moderate LVH, grade 2 diastolic dysfunction without any significant valvular abnormalities -Cardiac catheterization: November 2017 nonobstructive CAD and elevated LVEDP -Supportive care with oxygen for true hypoxemia as well as for for subjective shortness of breath -Cardiology recommends repeating echocardiogram this admission  Active Problems:   Uncontrolled hypertension -Occurring in the context of noncompliance with medications as well as evolving chronic kidney disease -Given reports of nausea and vomiting with oral medications have ordered IV antihypertensive medications as stated above    Cough in adult -Patient reports subjective fevers and chills as well as nasal congestion and cough with clear to yellow sputum prior to admission -Patient does have leukocytosis but no evidence  of focal infiltrate on x-ray -Obtain respiratory viral panel -Supportive care -Very minimal wheezing so will initiate nebulizers-worsens may require IV steroids    CKD (chronic kidney disease), stage V/Metabolic acidosis -Patient has developed rapid progressive decline in renal function over the past year noting creatinine was 1.84 November 2017 and currently is 7.19 with BUN 59 and mild acidemia with a CO2 of 18 but a normal anion gap -  I suspect her ongoing nausea is related to her kidney disease and uremia -Patient has followed up several times with nephrology in [place] but has expressed desire to change to nephrology outside of [facility] and told physicians at previous admission last month that she was going to obtain appointment at [facility] but never did so -She does not have dialysis access in place -I am concerned she is reaching the point that she may require dialysis-given her desire to not seek nephrology care in [employer] uncertain if she would be a candidate to transfer to tertiary facility if dialysis indicated this admission; if not she will need to see nephrology during this hospitalization especially if renal function worsens with diuresis -May also need to begin oral bicarbonate due to her metabolic acidosis secondary to her progressive kidney disease    Diabetes mellitus type 2 in obese  -Currently not on medications -HgbA1c November 2017 was 10.7 -Current CBGs not markedly elevated for completeness of evaluation will obtain HgbA1c     Anemia of chronic kidney disease -Likely would benefit from Procrit but has yet to establish regularly with outpatient nephrology      DVT prophylaxis: Subcutaneous heparin Code Status: Full Family Communication: Daughter Disposition Plan: Home Consults called: Cardiology/[facility] on-call physician    Samella Parr ANP-BC [facility] Pager [phone]   If 7PM-7AM, please contact  night-coverage [email or web address] Password [email or web address]  12/30/2017, 5:12 PM

## 2017-12-30 NOTE — Progress Notes (Signed)
   12/30/17 1300  Clinical Encounter Type  Visited With Family  Visit Type Initial  Stress Factors  Family Stress Factors Health changes   Encountered daughter of patient in the hall.  Asked what was going on and she shared about her mom.  Showed empathy as she is worried.  Will follow as needed. Chaplain Katherene Ponto

## 2017-12-30 NOTE — ED Triage Notes (Signed)
Pt here from EMS with SOB and wheezing from PCP. Pt has hx of same and hx of HTN and CKD. Pt is not on dialysis. BP 284/150, pt has not taken BP meds in 1 week. HR 98, 100 room air, afebrile. 20 LAC

## 2017-12-30 NOTE — ED Notes (Signed)
Manual BP done, per Dr. Lacinda Axon. Dr. Lacinda Axon informed of pt's BP.

## 2017-12-31 ENCOUNTER — Inpatient Hospital Stay (HOSPITAL_COMMUNITY): Payer: Managed Care, Other (non HMO)

## 2017-12-31 ENCOUNTER — Observation Stay (HOSPITAL_BASED_OUTPATIENT_CLINIC_OR_DEPARTMENT_OTHER): Payer: Managed Care, Other (non HMO)

## 2017-12-31 ENCOUNTER — Encounter (HOSPITAL_COMMUNITY): Payer: Self-pay | Admitting: Interventional Radiology

## 2017-12-31 ENCOUNTER — Other Ambulatory Visit: Payer: Self-pay

## 2017-12-31 DIAGNOSIS — D631 Anemia in chronic kidney disease: Secondary | ICD-10-CM | POA: Diagnosis present

## 2017-12-31 DIAGNOSIS — Z833 Family history of diabetes mellitus: Secondary | ICD-10-CM | POA: Diagnosis not present

## 2017-12-31 DIAGNOSIS — I5032 Chronic diastolic (congestive) heart failure: Secondary | ICD-10-CM | POA: Diagnosis not present

## 2017-12-31 DIAGNOSIS — I132 Hypertensive heart and chronic kidney disease with heart failure and with stage 5 chronic kidney disease, or end stage renal disease: Secondary | ICD-10-CM | POA: Diagnosis present

## 2017-12-31 DIAGNOSIS — I5033 Acute on chronic diastolic (congestive) heart failure: Secondary | ICD-10-CM | POA: Diagnosis present

## 2017-12-31 DIAGNOSIS — J208 Acute bronchitis due to other specified organisms: Secondary | ICD-10-CM | POA: Diagnosis not present

## 2017-12-31 DIAGNOSIS — Z01810 Encounter for preprocedural cardiovascular examination: Secondary | ICD-10-CM | POA: Diagnosis not present

## 2017-12-31 DIAGNOSIS — Z8249 Family history of ischemic heart disease and other diseases of the circulatory system: Secondary | ICD-10-CM | POA: Diagnosis not present

## 2017-12-31 DIAGNOSIS — Z992 Dependence on renal dialysis: Secondary | ICD-10-CM | POA: Diagnosis present

## 2017-12-31 DIAGNOSIS — I5030 Unspecified diastolic (congestive) heart failure: Secondary | ICD-10-CM | POA: Diagnosis not present

## 2017-12-31 DIAGNOSIS — Z6841 Body Mass Index (BMI) 40.0 and over, adult: Secondary | ICD-10-CM | POA: Diagnosis not present

## 2017-12-31 DIAGNOSIS — E1169 Type 2 diabetes mellitus with other specified complication: Secondary | ICD-10-CM

## 2017-12-31 DIAGNOSIS — I2510 Atherosclerotic heart disease of native coronary artery without angina pectoris: Secondary | ICD-10-CM | POA: Diagnosis present

## 2017-12-31 DIAGNOSIS — E1122 Type 2 diabetes mellitus with diabetic chronic kidney disease: Secondary | ICD-10-CM | POA: Diagnosis present

## 2017-12-31 DIAGNOSIS — E872 Acidosis: Secondary | ICD-10-CM | POA: Diagnosis present

## 2017-12-31 DIAGNOSIS — Z79899 Other long term (current) drug therapy: Secondary | ICD-10-CM | POA: Diagnosis not present

## 2017-12-31 DIAGNOSIS — E669 Obesity, unspecified: Secondary | ICD-10-CM | POA: Diagnosis not present

## 2017-12-31 DIAGNOSIS — I160 Hypertensive urgency: Secondary | ICD-10-CM | POA: Diagnosis present

## 2017-12-31 DIAGNOSIS — Z9114 Patient's other noncompliance with medication regimen: Secondary | ICD-10-CM | POA: Diagnosis not present

## 2017-12-31 DIAGNOSIS — E1121 Type 2 diabetes mellitus with diabetic nephropathy: Secondary | ICD-10-CM | POA: Diagnosis present

## 2017-12-31 DIAGNOSIS — N185 Chronic kidney disease, stage 5: Secondary | ICD-10-CM | POA: Diagnosis present

## 2017-12-31 DIAGNOSIS — Z9119 Patient's noncompliance with other medical treatment and regimen: Secondary | ICD-10-CM | POA: Diagnosis not present

## 2017-12-31 DIAGNOSIS — Z87891 Personal history of nicotine dependence: Secondary | ICD-10-CM | POA: Diagnosis not present

## 2017-12-31 DIAGNOSIS — E119 Type 2 diabetes mellitus without complications: Secondary | ICD-10-CM | POA: Diagnosis not present

## 2017-12-31 DIAGNOSIS — J209 Acute bronchitis, unspecified: Secondary | ICD-10-CM | POA: Diagnosis present

## 2017-12-31 HISTORY — PX: IR US GUIDE VASC ACCESS RIGHT: IMG2390

## 2017-12-31 HISTORY — PX: IR FLUORO GUIDE CV LINE RIGHT: IMG2283

## 2017-12-31 LAB — RESPIRATORY PANEL BY PCR
Adenovirus: NOT DETECTED
Bordetella pertussis: NOT DETECTED
CORONAVIRUS 229E-RVPPCR: NOT DETECTED
CORONAVIRUS HKU1-RVPPCR: NOT DETECTED
CORONAVIRUS OC43-RVPPCR: NOT DETECTED
Chlamydophila pneumoniae: NOT DETECTED
Coronavirus NL63: NOT DETECTED
INFLUENZA B-RVPPCR: NOT DETECTED
Influenza A: NOT DETECTED
METAPNEUMOVIRUS-RVPPCR: NOT DETECTED
MYCOPLASMA PNEUMONIAE-RVPPCR: NOT DETECTED
PARAINFLUENZA VIRUS 1-RVPPCR: NOT DETECTED
PARAINFLUENZA VIRUS 2-RVPPCR: NOT DETECTED
Parainfluenza Virus 3: NOT DETECTED
Parainfluenza Virus 4: NOT DETECTED
Respiratory Syncytial Virus: NOT DETECTED
Rhinovirus / Enterovirus: NOT DETECTED

## 2017-12-31 LAB — CBC
HCT: 27 % — ABNORMAL LOW (ref 36.0–46.0)
Hemoglobin: 8.4 g/dL — ABNORMAL LOW (ref 12.0–15.0)
MCH: 26.5 pg (ref 26.0–34.0)
MCHC: 31.1 g/dL (ref 30.0–36.0)
MCV: 85.2 fL (ref 78.0–100.0)
PLATELETS: 273 10*3/uL (ref 150–400)
RBC: 3.17 MIL/uL — ABNORMAL LOW (ref 3.87–5.11)
RDW: 15.7 % — AB (ref 11.5–15.5)
WBC: 10.5 10*3/uL (ref 4.0–10.5)

## 2017-12-31 LAB — BASIC METABOLIC PANEL
ANION GAP: 7 (ref 5–15)
BUN: 55 mg/dL — AB (ref 6–20)
CALCIUM: 7.9 mg/dL — AB (ref 8.9–10.3)
CO2: 21 mmol/L — ABNORMAL LOW (ref 22–32)
CREATININE: 7.43 mg/dL — AB (ref 0.44–1.00)
Chloride: 110 mmol/L (ref 101–111)
GFR calc Af Amer: 6 mL/min — ABNORMAL LOW (ref >60)
GFR calc non Af Amer: 6 mL/min — ABNORMAL LOW (ref >60)
GLUCOSE: 163 mg/dL — AB (ref 65–99)
Potassium: 3.6 mmol/L (ref 3.5–5.1)
SODIUM: 138 mmol/L (ref 135–145)

## 2017-12-31 LAB — GLUCOSE, CAPILLARY
GLUCOSE-CAPILLARY: 132 mg/dL — AB (ref 65–99)
Glucose-Capillary: 179 mg/dL — ABNORMAL HIGH (ref 65–99)

## 2017-12-31 LAB — ECHOCARDIOGRAM COMPLETE: WEIGHTICAEL: 4304 [oz_av]

## 2017-12-31 LAB — FERRITIN: Ferritin: 167 ng/mL (ref 11–307)

## 2017-12-31 LAB — IRON AND TIBC
Iron: 33 ug/dL (ref 28–170)
Saturation Ratios: 14 % (ref 10.4–31.8)
TIBC: 235 ug/dL — ABNORMAL LOW (ref 250–450)
UIBC: 202 ug/dL

## 2017-12-31 LAB — ALT: ALT: 25 U/L (ref 14–54)

## 2017-12-31 MED ORDER — ALBUTEROL SULFATE (2.5 MG/3ML) 0.083% IN NEBU: 2.5000 mg | INHALATION_SOLUTION | RESPIRATORY_TRACT | Status: DC | PRN

## 2017-12-31 MED ORDER — LIDOCAINE HCL (PF) 1 % IJ SOLN: 5.0000 mL | INTRAMUSCULAR | Status: DC | PRN

## 2017-12-31 MED ORDER — HEPARIN SODIUM (PORCINE) 1000 UNIT/ML IJ SOLN
INTRAMUSCULAR | Status: AC
  Administered 2017-12-31: 1000 [IU] via INTRAVENOUS_CENTRAL
  Filled 2017-12-31: qty 1

## 2017-12-31 MED ORDER — PENTAFLUOROPROP-TETRAFLUOROETH EX AERO: 1.0000 "application " | INHALATION_SPRAY | CUTANEOUS | Status: DC | PRN

## 2017-12-31 MED ORDER — AMLODIPINE BESYLATE 10 MG PO TABS
10.0000 mg | ORAL_TABLET | Freq: Every day | ORAL | Status: DC
  Administered 2018-01-01 – 2018-01-05 (×4): 10 mg via ORAL
  Filled 2017-12-31 (×5): qty 1

## 2017-12-31 MED ORDER — FUROSEMIDE 10 MG/ML IJ SOLN
160.0000 mg | Freq: Three times a day (TID) | INTRAMUSCULAR | Status: DC
  Administered 2017-12-31 – 2018-01-01 (×3): 160 mg via INTRAVENOUS
  Filled 2017-12-31: qty 16
  Filled 2017-12-31: qty 10
  Filled 2017-12-31 (×2): qty 16

## 2017-12-31 MED ORDER — LIDOCAINE HCL 1 % IJ SOLN
INTRAMUSCULAR | Status: AC
  Filled 2017-12-31: qty 20

## 2017-12-31 MED ORDER — LIDOCAINE HCL (PF) 1 % IJ SOLN
INTRAMUSCULAR | Status: DC | PRN
  Administered 2017-12-31: 5 mL

## 2017-12-31 MED ORDER — SODIUM CHLORIDE 0.9 % IV SOLN: 100.0000 mL | INTRAVENOUS | Status: DC | PRN

## 2017-12-31 MED ORDER — ALTEPLASE 2 MG IJ SOLR: 2.0000 mg | Freq: Once | INTRAMUSCULAR | Status: DC | PRN

## 2017-12-31 MED ORDER — INSULIN ASPART 100 UNIT/ML ~~LOC~~ SOLN
0.0000 [IU] | Freq: Three times a day (TID) | SUBCUTANEOUS | Status: DC
  Administered 2017-12-31 – 2018-01-02 (×4): 2 [IU] via SUBCUTANEOUS
  Administered 2018-01-03: 1 [IU] via SUBCUTANEOUS
  Administered 2018-01-03: 3 [IU] via SUBCUTANEOUS

## 2017-12-31 MED ORDER — HEPARIN SODIUM (PORCINE) 1000 UNIT/ML DIALYSIS
1000.0000 [IU] | INTRAMUSCULAR | Status: DC | PRN
  Administered 2017-12-31: 1000 [IU] via INTRAVENOUS_CENTRAL
  Filled 2017-12-31 (×2): qty 1

## 2017-12-31 MED ORDER — HEPARIN SODIUM (PORCINE) 1000 UNIT/ML IJ SOLN
INTRAMUSCULAR | Status: DC | PRN
  Administered 2017-12-31: 2800 [IU] via INTRAVENOUS

## 2017-12-31 MED ORDER — LIDOCAINE-PRILOCAINE 2.5-2.5 % EX CREA
1.0000 "application " | TOPICAL_CREAM | CUTANEOUS | Status: DC | PRN
  Filled 2017-12-31: qty 5

## 2017-12-31 MED ORDER — LABETALOL HCL 200 MG PO TABS
200.0000 mg | ORAL_TABLET | Freq: Two times a day (BID) | ORAL | Status: DC
  Administered 2017-12-31 – 2018-01-05 (×8): 200 mg via ORAL
  Filled 2017-12-31 (×10): qty 1

## 2017-12-31 MED ORDER — LABETALOL HCL 5 MG/ML IV SOLN
10.0000 mg | Freq: Four times a day (QID) | INTRAVENOUS | Status: DC | PRN
  Filled 2017-12-31: qty 4

## 2017-12-31 MED ORDER — FAMOTIDINE 20 MG PO TABS
20.0000 mg | ORAL_TABLET | Freq: Every day | ORAL | Status: DC
  Administered 2018-01-01 – 2018-01-05 (×4): 20 mg via ORAL
  Filled 2017-12-31 (×5): qty 1

## 2017-12-31 NOTE — Progress Notes (Addendum)
PROGRESS NOTE   Pamela Campbell  HEN:[id number]    DOB: 02-13-1961    DOA: 12/30/2017  PCP: Dineen Kid, MD   I have briefly reviewed patients previous medical records in [facility]  Brief Narrative:  57 year old female with PMH of poorly controlled HTN, DM 2, chronic kidney disease, chronic diastolic CHF, medical noncompliance, obesity, recent hospital discharge 12/21 for hypertensive urgency requiring nicardipine drip, was sent to ED from PCP office due to dyspnea, wheezing, nausea. Initial blood pressure 284/150. She also reported subjective fevers, chills, nasal congestion and mostly nonproductive cough. Exposed to adult son with URI symptoms prior to her getting sick. Admitted for acute on chronic diastolic CHF, hypertensive urgency and acute bronchitis. Cardiology and nephrology consulting.   Assessment & Plan:   Principal Problem:   Acute on chronic diastolic heart failure (HCC) Active Problems:   Cough in adult   Uncontrolled hypertension   Diabetes mellitus type 2 in obese (HCC)   CKD (chronic kidney disease), stage V (HCC)   Metabolic acidosis   1. Acute on chronic diastolic CHF: Precipitated by progressive chronic kidney disease, poorly controlled hypertension and noncompliance. 2-D echo 12/31/17: LVEF 60-65 percent and grade 1 diastolic dysfunction. Cardiac cath 10/2016: Nonobstructive CAD. Initially placed on IV Lasix 100 mg twice daily without significant diuresis. Cardiology input appreciated. Nephrology recommends Lasix 160 mg every 8 hourly, started. If volume not adequately managed by medications, will likely need dialysis. 2. Hypertensive urgency: Complicated by noncompliance. Now started on labetalol 200 MG twice a day and amlodipine 10 MG daily. IV Lasix also should help. 3. Acute viral bronchitis: Exposed to an adult son with URI symptoms. Chest x-ray without pneumonia. Supportive treatment. Follow RSV panel. Pro-calcitonin 0.10. 4. Stage V chronic kidney  disease/anabolic acidosis: Her renal functions have been rapidly declining over the past year. Creatinine was 1.84 in November 2017 and now presented with creatinine >7. Her nausea may be related to uremia or congestive gastropathy from CHF. She is unhappy with [facility] outpatient nephrologist and planning to seek this care at [facility] (not established yet). After long discussion with me, the agreeable to inpatient nephrology consultation. Discussed with Dr. Justin Mend, agreeable to inpatient dialysis if needed. 5. Type II DM with renal complications: T6R: 7.4. Monitor CBGs and SSI. 6. Anemia of chronic kidney disease: Follow CBCs. May consider Aranesp. 7. Medical noncompliance: Counseled. 8. Morbid obesity/Body mass index is 49.2 kg/m.   DVT prophylaxis: Heparin Code Status: Full Family Communication: None at bedside Disposition: DC home when medically improved, may take several days.  Patient is quite ill. She has complex multiple medical problems as stated above which will require hospital stay, close monitoring and management as indicated above for more than 2 midnights. Thereby will change to inpatient.   Consultants:  Cardiology Nephrology   Procedures:  2-D echo  Antimicrobials:  None    Subjective: Reports that her dyspnea is 50% better. Mostly dry cough. No chest pain, fever or chills. No nausea or vomiting reported today. States that she did not make much urine with IV Lasix.  ROS: No dizziness or lightheadedness.  Objective:  Vitals:   12/31/17 0800 12/31/17 0900 12/31/17 1000 12/31/17 1100  BP: (!) 175/78 (!) 180/84 (!) 169/76 (!) 174/71  Pulse: 85 84 73 80  Resp: (!) 25 (!) 21 (!) 23 (!) 21  Temp:      TempSrc:      SpO2: 99% 98% 100% 99%  Weight:  Examination:  General exam: Pleasant young female, moderately built and morbidly obese, lying comfortably propped up in bed. Respiratory system: Clear to auscultation anteriorly. Diminished breath  sounds posteriorly with scattered bilateral few expiratory rhonchi and basal crackles. No increased work of breathing. Cardiovascular system: S1 & S2 heard, RRR. No JVD, murmurs, rubs, gallops or clicks. 1+ pitting bilateral leg edema. Gastrointestinal system: Abdomen is nondistended, soft and nontender. No organomegaly or masses felt. Normal bowel sounds heard. Central nervous system: Alert and oriented. No focal neurological deficits. Extremities: Symmetric 5 x 5 power. Skin: No rashes, lesions or ulcers Psychiatry: Judgement and insight appear normal. Mood & affect appropriate.     Data Reviewed: I have personally reviewed following labs and imaging studies  CBC: Recent Labs  Lab 12/30/17 1400  WBC 14.8*  NEUTROABS 11.2*  HGB 9.5*  HCT 30.5*  MCV 83.6  PLT 035   Basic Metabolic Panel: Recent Labs  Lab 12/30/17 1400 12/30/17 1703 12/31/17 0815  NA 140  --  138  K 3.9  --  3.6  CL 111  --  110  CO2 18*  --  21*  GLUCOSE 150*  --  163*  BUN 59*  --  55*  CREATININE 7.19*  --  7.43*  CALCIUM 7.9*  --  7.9*  MG  --  1.9  --    Liver Function Tests: No results for input(s): AST, ALT, ALKPHOS, BILITOT, PROT, ALBUMIN in the last 168 hours. Coagulation Profile: No results for input(s): INR, PROTIME in the last 168 hours. Cardiac Enzymes: No results for input(s): CKTOTAL, CKMB, CKMBINDEX, TROPONINI in the last 168 hours. HbA1C: Recent Labs    12/30/17 1703  HGBA1C 7.4*   CBG: No results for input(s): GLUCAP in the last 168 hours.  No results found for this or any previous visit (from the past 240 hour(s)).       Radiology Studies: Dg Chest 2 View  Result Date: 12/30/2017 CLINICAL DATA:  Two weeks of shortness of breath and cough. History of CHF, hypertension, diabetes former smoker. EXAM: CHEST  2 VIEW COMPARISON:  Chest x-ray of December 08, 2017 FINDINGS: The lungs are well-expanded. The interstitial markings remain increased. The pulmonary vascularity while  in [place] is not as conspicuous as on the previous study. The cardiac silhouette remains enlarged. There is no significant pleural effusion. The observed bony thorax is unremarkable. IMPRESSION: CHF with mild interstitial edema not as conspicuous as on the previous study. No acute pneumonia. Electronically Signed   By: David  Martinique M.D.   On: 12/30/2017 13:48        Scheduled Meds: . amLODipine  10 mg Oral Daily  . heparin  5,000 Units Subcutaneous Q8H  . ipratropium-albuterol  3 mL Nebulization Q6H  . labetalol  200 mg Oral BID  . labetalol  10 mg Intravenous Q6H  . sodium chloride flush  3 mL Intravenous Q12H   Continuous Infusions: . sodium chloride    . famotidine (PEPCID) IV Stopped (12/31/17 1018)  . furosemide       LOS: 0 days     Vernell Leep, MD, FACP, [facility]. [facility] Pager [phone]  If 7PM-7AM, please contact night-coverage [email or web address] Password [email or web address] 12/31/2017, 12:00 PM

## 2017-12-31 NOTE — ED Notes (Signed)
Echo in process at the bedside.

## 2017-12-31 NOTE — Progress Notes (Signed)
Progress Note  Patient Name: Pamela Campbell Date of Encounter: 12/31/2017  Primary Cardiologist:   Dr. Alean Rinne  Subjective   She is breathing better today.  Not at baseline.  No chest pain.   Inpatient Medications    Scheduled Meds: . amLODipine  10 mg Oral Daily  . furosemide  100 mg Intravenous BID  . heparin  5,000 Units Subcutaneous Q8H  . ipratropium-albuterol  3 mL Nebulization Q6H  . labetalol  200 mg Oral BID  . labetalol  10 mg Intravenous Q6H  . sodium chloride flush  3 mL Intravenous Q12H   Continuous Infusions: . sodium chloride    . famotidine (PEPCID) IV Stopped (12/30/17 2352)   PRN Meds: sodium chloride, acetaminophen, calcium carbonate (dosed in mg elemental calcium), camphor-menthol **AND** hydrOXYzine, docusate sodium, feeding supplement (NEPRO CARB STEADY), sodium chloride flush, sorbitol, zolpidem   Vital Signs    Vitals:   12/31/17 0500 12/31/17 0600 12/31/17 0645 12/31/17 0800  BP: (!) 183/81 (!) 181/79  (!) 175/78  Pulse: 93 92 82 85  Resp: (!) 25 (!) 28 (!) 24 (!) 25  Temp:      TempSrc:      SpO2: 100% 97% 96% 99%  Weight:        Intake/Output Summary (Last 24 hours) at 12/31/2017 0859 Last data filed at 12/30/2017 2352 Gross per 24 hour  Intake 50 ml  Output 400 ml  Net -350 ml   Filed Weights   12/30/17 1304  Weight: 269 lb (122 kg)     Physical Exam   General: Well developed, well nourished, female appearing in no acute distress. Head: Normocephalic, atraumatic.  Neck: Supple without bruits, JVD. Lungs:  Diffuse wheezing Heart: RRR, S1, S2, no S3, S4, or murmur; no rub. Abdomen: Soft, non-tender, non-distended with normoactive bowel sounds. No hepatomegaly. No rebound/guarding. No obvious abdominal masses. Extremities: No clubbing, cyanosis.  Positive mild edema. Distal pedal pulses are 2+ bilaterally. Neuro: Alert and oriented X 3. Moves all extremities spontaneously. Psych: Normal affect.  Labs    Chemistry Recent  Labs  Lab 12/30/17 1400 12/31/17 0815  NA 140 138  K 3.9 3.6  CL 111 110  CO2 18* 21*  GLUCOSE 150* 163*  BUN 59* 55*  CREATININE 7.19* 7.43*  CALCIUM 7.9* 7.9*  GFRNONAA 6* 6*  GFRAA 7* 6*  ANIONGAP 11 7     Hematology Recent Labs  Lab 12/30/17 1400  WBC 14.8*  RBC 3.65*  HGB 9.5*  HCT 30.5*  MCV 83.6  MCH 26.0  MCHC 31.1  RDW 15.3  PLT 324    Cardiac EnzymesNo results for input(s): TROPONINI in the last 168 hours.  Recent Labs  Lab 12/30/17 1414  TROPIPOC 0.05     BNP Recent Labs  Lab 12/30/17 1400  BNP 446.8*     DDimer No results for input(s): DDIMER in the last 168 hours.   Radiology    Dg Chest 2 View  Result Date: 12/30/2017 CLINICAL DATA:  Two weeks of shortness of breath and cough. History of CHF, hypertension, diabetes former smoker. EXAM: CHEST  2 VIEW COMPARISON:  Chest x-ray of December 08, 2017 FINDINGS: The lungs are well-expanded. The interstitial markings remain increased. The pulmonary vascularity while in [place] is not as conspicuous as on the previous study. The cardiac silhouette remains enlarged. There is no significant pleural effusion. The observed bony thorax is unremarkable. IMPRESSION: CHF with mild interstitial edema not as conspicuous as on the previous study.  No acute pneumonia. Electronically Signed   By: David  Martinique M.D.   On: 12/30/2017 13:48     Telemetry    NSR - Personally Reviewed  ECG    NA - Personally Reviewed   Cardiac Studies   LHC 10/2016 Coronary Findings   Diagnostic  Dominance: Right  Left Main  Vessel is angiographically normal.  Left Anterior Descending  Prox LAD lesion 20% stenosed  There is very mild narrowing at the LAD ostium compared with the large caliber of the proximal LAD. There are no flow obstructive lesions.  Left Circumflex  Mid Cx lesion 30% stenosed  Mid Cx lesion.  Right Coronary Artery  Large, dominant vessel. The vessel gives off several branches to the PDA and  PLA, both of which supply a large territory. The vessel is smooth and appears angiographically normal.   Conclusion  1. Mild nonobstructive coronary artery disease 2. Elevated LVEDP   2D Echo 10/27/2015 Study Conclusions  - Left ventricle: The cavity size was normal. Wall thickness was increased in a pattern of moderate LVH. Systolic function was normal. The estimated ejection fraction was in the range of 55% to 60%. Wall motion was normal; there were no regional wall motion abnormalities. Features are consistent with a pseudonormal left ventricular filling pattern, with concomitant abnormal relaxation and increased filling pressure (grade 2 diastolic dysfunction). - Aortic valve: Trileaflet; mildly thickened, mildly calcified leaflets. - Mitral valve: There was trivial regurgitation. - Left atrium: The atrium was mildly dilated.   Patient Profile     57 y.o. female with a hx of chronic diastolic HF, mild nonobstructive CAD by cath in 2017, HTN, HLD, type 2 DM, stage III CKD and h/o medication noncompliance, who is being seen today for the evaluation of acute on chronic diastolic HF in the setting of hypertensive urgency,  Assessment & Plan    1. Hypertensive urgency - pressures are not controlled, in the 170-180s - pt received IV labetalol last evening for elevated pressure - will start home dose labetalol 200 mg BID - will start 10 mg norvasc   2. Acute on chronic diastolic heart failure - echo pending - lasix 100 mg IV BID started yesterday - pt with modest urine output of 400 cc yesterday - follow daily weights - BNP elevated, but in the setting of CKD   3. CKD  - per nephrology - may need HD   4. Medication noncompliance - avoid TID medications - clonidine may not be a good choice for her given rebound HTN; will avoid hydralazine as a TID medication   5. Leukocytosis - labs pending    Ronnell Guadalajara , PA-C 8:59  AM 12/31/2017 Pager: [phone]  History and all data above reviewed.  Patient examined.  I agree with the findings as above. She is breathing better but not quite at baseline.  No pain.  The patient exam reveals COR:RRR  ,  Lungs: Wheezing but improved air movement  ,  Abd: Positive bowel sounds, no rebound no guarding, Ext No edema  .  All available labs, radiology testing, previous records reviewed. Agree with documented assessment and plan. Acute on chronic diastolic HF.  Echo pending results.  Continue IV diuresis.  HTN:  PO labetalol started and will start Norvasc as well.  CKD:  Per the primary team.  I suspect that she will soon need dialysis.    Jeneen Rinks Tynika Luddy  9:01 AM  12/31/2017

## 2017-12-31 NOTE — ED Notes (Signed)
Heart Healthy Diet ordered for Lunch. 

## 2017-12-31 NOTE — Consult Note (Signed)
Hospital Consult    Reason for Consult:  ESRD in need of dialysis access Requesting Physician:  Dr. Justin Mend MRN #:  [id number]  History of Present Illness: This is a 57 y.o. female PMH of poorly controlled HTN, DM 2, chronic kidney disease, chronic diastolic CHF, medical noncompliance, and obesity.  Vascular surgery has been consulted for dialysis access placement.  Patient is scheduled to have interventional radiology place a tunneled dialysis catheter today to initiate dialysis.  According to patient's chart she wanted to establish care at a [facility] however upon discussion with this patient she is agreeable to having surgery here in [place].  She denies tobacco use.  She is left-hand dominant and would prefer dialysis access in right arm.  She has been admitted to the hospital for acute on chronic diastolic CHF, hypertensive urgency, and acute bronchitis.    Past Medical History:  Diagnosis Date  . Chronic diastolic CHF (congestive heart failure) ([place])    a. 03/2015: echo w/ EF of 50-55%, no WMA, Grade 2 DD, trivial AI, mild MR.  . Diabetes mellitus without complication ([place])   . Hypertension     Past Surgical History:  Procedure Laterality Date  . CARDIAC CATHETERIZATION N/A 11/02/2016   Procedure: Left Heart Cath and Coronary Angiography;  Surgeon: Sherren Mocha, MD;  Location: [facility];  Service: Cardiovascular;  Laterality: N/A;  . CESAREAN SECTION    . FRACTURE SURGERY    . TUBAL LIGATION      Allergies  Allergen Reactions  . Compazine [Prochlorperazine Edisylate] Swelling  . Prochlorperazine Swelling    Tongue swelling   . Vioxx [Rofecoxib] Other (See Comments)    Other reaction(s): Bleeding (intolerance) Bleeding    Prior to Admission medications   Medication Sig Start Date End Date Taking? Authorizing Provider  calcitRIOL (ROCALTROL) 0.25 MCG capsule Take 0.25 capsules by mouth daily. 09/13/17  Yes [provider]    hydrALAZINE (APRESOLINE) 50 MG tablet Take 1 tablet (50 mg total) by mouth 3 (three) times daily. 10/15/17  Yes Almyra Deforest, PA  labetalol (NORMODYNE) 200 MG tablet Take 400 mg by mouth 2 (two) times daily.   Yes [provider]  ondansetron (ZOFRAN-ODT) 4 MG disintegrating tablet Take 1 tablet (4 mg total) by mouth every 6 (six) hours as needed for nausea or vomiting. 10/12/17  Yes Helberg, Larkin Ina, MD  torsemide (DEMADEX) 20 MG tablet Take 40 mg by mouth 2 (two) times daily.  09/04/17  Yes [provider]    Social History   Socioeconomic History  . Marital status: Married    Spouse name: Not on file  . Number of children: Not on file  . Years of education: Not on file  . Highest education level: Not on file  Social Needs  . Financial resource strain: Not on file  . Food insecurity - worry: Not on file  . Food insecurity - inability: Not on file  . Transportation needs - medical: Not on file  . Transportation needs - non-medical: Not on file  Occupational History  . Not on file  Tobacco Use  . Smoking status: Former Smoker    Last attempt to quit: 03/22/1995    Years since quitting: 22.7  . Smokeless tobacco: Never Used  Substance and Sexual Activity  . Alcohol use: No  . Drug use: No  . Sexual activity: No    Birth control/protection: None  Other Topics Concern  . Not on file  Social History  Narrative  . Not on file     Family History  Problem Relation Age of Onset  . Diabetes Mellitus II Mother   . Hypertension Mother   . Hyperlipidemia Mother   . Kidney disease Mother   . Heart disease Father   . Heart attack Father 65  . Congestive Heart Failure Brother   . Congestive Heart Failure Son     ROS: Otherwise negative unless mentioned in HPI  Physical Examination  Vitals:   12/31/17 1432 12/31/17 1521  BP:  (!) 175/76  Pulse: 78 84  Resp: 20   Temp:    SpO2: 100%    Body mass index is 44.43 kg/m.  General:  WDWN in NAD Gait: Not  observed HENT: WNL, normocephalic Pulmonary: normal non-labored breathing, without Rales, rhonchi,  wheezing Cardiac: regular Abdomen:  soft, NT/ND, no masses Skin: without rashes Vascular Exam/Pulses: Palpable and symmetrical DP pulses; palpable and symmetrical radial pulses Extremities: without ischemic changes, without Gangrene , without cellulitis; without open wounds; edema of bilateral lower extremities to the level of the knee Musculoskeletal: no muscle wasting or atrophy  Neurologic: A&O X 3;  No focal weakness or paresthesias are detected; speech is fluent/normal Psychiatric:  The pt has Normal affect. Lymph:  Unremarkable  CBC    Component Value Date/Time   WBC 14.8 (H) 12/30/2017 1400   RBC 3.65 (L) 12/30/2017 1400   HGB 9.5 (L) 12/30/2017 1400   HCT 30.5 (L) 12/30/2017 1400   PLT 324 12/30/2017 1400   MCV 83.6 12/30/2017 1400   MCH 26.0 12/30/2017 1400   MCHC 31.1 12/30/2017 1400   RDW 15.3 12/30/2017 1400   LYMPHSABS 2.3 12/30/2017 1400   MONOABS 1.0 12/30/2017 1400   EOSABS 0.2 12/30/2017 1400   BASOSABS 0.0 12/30/2017 1400    BMET    Component Value Date/Time   NA 138 12/31/2017 0815   NA 141 10/19/2017 1533   K 3.6 12/31/2017 0815   CL 110 12/31/2017 0815   CO2 21 (L) 12/31/2017 0815   GLUCOSE 163 (H) 12/31/2017 0815   BUN 55 (H) 12/31/2017 0815   BUN 68 (H) 10/19/2017 1533   CREATININE 7.43 (H) 12/31/2017 0815   CREATININE 2.18 (H) 12/03/2016 1141   CALCIUM 7.9 (L) 12/31/2017 0815   GFRNONAA 6 (L) 12/31/2017 0815   GFRNONAA 25 (L) 12/03/2016 1141   GFRAA 6 (L) 12/31/2017 0815   GFRAA 29 (L) 12/03/2016 1141    COAGS: Lab Results  Component Value Date   INR 0.99 10/30/2016     Non-Invasive Vascular Imaging:   Vein mapping pending  Statin:  No. Beta Blocker:  Yes.   Aspirin:  Yes.   ACEI:  No. ARB:  No. CCB use:  No Other antiplatelets/anticoagulants:  No.    ASSESSMENT/PLAN: This is a 57 y.o. female in need of dialysis access  placement  Plan is for tunneled dialysis catheter placement by interventional radiology today Upper extremity vein mapping has been ordered AV fistula versus graft placement has been discussed with patient including the risks and benefits of the surgery and all questions were answered Patient would prefer dialysis access in right arm; right arm will be restricted Dr. Donzetta Matters will be evaluating patient later today and will discuss timing of access placement   Dagoberto Ligas PA-C [facility] [phone]   I have independently interviewed and examined patient and agree with PA assessment and plan above. J left hand dominant and will need right arm access having never  had Any surgery, chest, breast surgery, pacemaker placement and this is her first catheter. We'll get vein mapping in follow-up with plan for access next week if she is to stand house or can be scheduled as an outpatient but would need to have vein mapping prior to discharge.   Jatorian Renault C. Donzetta Matters, MD [facility] Office: [phone] Pager: [phone]

## 2017-12-31 NOTE — ED Notes (Signed)
Repeat BMP sent to lab due to "hemolysis" of previous sample.

## 2017-12-31 NOTE — Progress Notes (Signed)
  Echocardiogram 2D Echocardiogram has been performed.  Pamela Campbell 12/31/2017, 9:40 AM

## 2017-12-31 NOTE — Progress Notes (Signed)
Patient returned to [place] from Dialysis. Patient alert and oriented. Patient alert and oriented. VSS. No complaints at this  time will continue to monitor patient.

## 2017-12-31 NOTE — Consult Note (Signed)
Referring Provider: No ref. provider found Primary Care Physician:  ViaLennette Bihari, MD Primary Nephrologist:  Dr. Florene Glen   Reason for Consultation:  CKD stage 5  Diabetes and diabetic nephropathy  Anemia and bone mineral management and Hypervolemia   HPI:  57 year old female with diastolic heart failure and coronary artery disease  Diabetes and history of medication non compliance  Presented to the ER with hypertensive urgency and dyspnea requiring BIPAP  She appears to have some volume overload and 2 D echo has been ordered as well as being placed on respiratory precautions for Flu  She has a creatinine of over 6 in December and has been resistant in proceeding with dialysis  This morning she is agreeable  Past Medical History:  Diagnosis Date  . Chronic diastolic CHF (congestive heart failure) ([place])    a. 03/2015: echo w/ EF of 50-55%, no WMA, Grade 2 DD, trivial AI, mild MR.  . Diabetes mellitus without complication ([place])   . Hypertension     Past Surgical History:  Procedure Laterality Date  . CARDIAC CATHETERIZATION N/A 11/02/2016   Procedure: Left Heart Cath and Coronary Angiography;  Surgeon: Sherren Mocha, MD;  Location: [facility];  Service: Cardiovascular;  Laterality: N/A;  . CESAREAN SECTION    . FRACTURE SURGERY    . TUBAL LIGATION      Prior to Admission medications   Medication Sig Start Date End Date Taking? Authorizing Provider  calcitRIOL (ROCALTROL) 0.25 MCG capsule Take 0.25 capsules by mouth daily. 09/13/17  Yes [provider]  hydrALAZINE (APRESOLINE) 50 MG tablet Take 1 tablet (50 mg total) by mouth 3 (three) times daily. 10/15/17  Yes Almyra Deforest, PA  labetalol (NORMODYNE) 200 MG tablet Take 400 mg by mouth 2 (two) times daily.   Yes [provider]  ondansetron (ZOFRAN-ODT) 4 MG disintegrating tablet Take 1 tablet (4 mg total) by mouth every 6 (six) hours as needed for nausea or vomiting. 10/12/17  Yes Helberg, Larkin Ina, MD  torsemide  (DEMADEX) 20 MG tablet Take 40 mg by mouth 2 (two) times daily.  09/04/17  Yes [provider]    Current Facility-Administered Medications  Medication Dose Route Frequency Provider Last Rate Last Dose  . 0.9 %  sodium chloride infusion  250 mL Intravenous PRN Samella Parr, NP      . acetaminophen (TYLENOL) tablet 650 mg  650 mg Oral Q4H PRN Samella Parr, NP      . amLODipine (NORVASC) tablet 10 mg  10 mg Oral Daily [facility], Tami Lin, [place]      . calcium carbonate (dosed in mg elemental calcium) suspension 500 mg of elemental calcium  500 mg of elemental calcium Oral Q6H PRN Samella Parr, NP      . camphor-menthol [facility]) lotion 1 application  1 application Topical Z6S PRN Samella Parr, NP       And  . hydrOXYzine (ATARAX/VISTARIL) tablet 25 mg  25 mg Oral Q8H PRN Samella Parr, NP      . docusate sodium (ENEMEEZ) enema 283 mg  1 enema Rectal PRN Samella Parr, NP      . famotidine (PEPCID) IVPB 20 mg premix  20 mg Intravenous Q12H Samella Parr, NP   Stopped at 12/31/17 1018  . feeding supplement (NEPRO CARB STEADY) liquid 237 mL  237 mL Oral TID PRN Samella Parr, NP      . furosemide (LASIX) 160 mg in dextrose 5 %  50 mL IVPB  160 mg Intravenous Janese Banks, MD      . heparin injection 5,000 Units  5,000 Units Subcutaneous Q8H Samella Parr, NP   5,000 Units at 12/31/17 [phone]  . ipratropium-albuterol (DUONEB) 0.5-2.5 (3) MG/3ML nebulizer solution 3 mL  3 mL Nebulization Q6H Samella Parr, NP   3 mL at 12/31/17 0949  . labetalol (NORMODYNE) tablet 200 mg  200 mg Oral BID Ledora Bottcher, PA      . labetalol (NORMODYNE,TRANDATE) injection 10 mg  10 mg Intravenous Q6H Bodenheimer, Charles A, NP   10 mg at 12/31/17 0949  . sodium chloride flush (NS) 0.9 % injection 3 mL  3 mL Intravenous Q12H Erin Hearing L, NP   3 mL at 12/31/17 1000  . sodium chloride flush (NS) 0.9 % injection 3 mL  3 mL Intravenous PRN Samella Parr, NP      . sorbitol 70  % solution 30 mL  30 mL Oral PRN Samella Parr, NP      . zolpidem (AMBIEN) tablet 5 mg  5 mg Oral QHS PRN Samella Parr, NP       Current Outpatient Medications  Medication Sig Dispense Refill  . calcitRIOL (ROCALTROL) 0.25 MCG capsule Take 0.25 capsules by mouth daily.  0  . hydrALAZINE (APRESOLINE) 50 MG tablet Take 1 tablet (50 mg total) by mouth 3 (three) times daily. 270 tablet 3  . labetalol (NORMODYNE) 200 MG tablet Take 400 mg by mouth 2 (two) times daily.    . ondansetron (ZOFRAN-ODT) 4 MG disintegrating tablet Take 1 tablet (4 mg total) by mouth every 6 (six) hours as needed for nausea or vomiting. 30 tablet 1  . torsemide (DEMADEX) 20 MG tablet Take 40 mg by mouth 2 (two) times daily.   1    Allergies as of 12/30/2017 - Review Complete 12/30/2017  Allergen Reaction Noted  . Compazine [prochlorperazine edisylate] Swelling 01/17/2013  . Prochlorperazine Swelling 06/04/2015  . Vioxx [rofecoxib] Other (See Comments) 01/17/2013    Family History  Problem Relation Age of Onset  . Diabetes Mellitus II Mother   . Hypertension Mother   . Hyperlipidemia Mother   . Kidney disease Mother   . Heart disease Father   . Heart attack Father 26  . Congestive Heart Failure Brother   . Congestive Heart Failure Son     Social History   Socioeconomic History  . Marital status: Married    Spouse name: Not on file  . Number of children: Not on file  . Years of education: Not on file  . Highest education level: Not on file  Social Needs  . Financial resource strain: Not on file  . Food insecurity - worry: Not on file  . Food insecurity - inability: Not on file  . Transportation needs - medical: Not on file  . Transportation needs - non-medical: Not on file  Occupational History  . Not on file  Tobacco Use  . Smoking status: Former Smoker    Last attempt to quit: 03/22/1995    Years since quitting: 22.7  . Smokeless tobacco: Never Used  Substance and Sexual Activity  .  Alcohol use: No  . Drug use: No  . Sexual activity: No    Birth control/protection: None  Other Topics Concern  . Not on file  Social History Narrative  . Not on file    Review of Systems: Gen: Denies any fever, chills, sweats, anorexia,  +  fatigue, +  Weakness,  HEENT: No visual complaints, No history of Retinopathy. Normal external appearance No Epistaxis or Sore throat. No sinusitis.   CV: Denies chest pain, angina, palpitations, syncope,  + orthopnea, +  PND, +  peripheral edema,  Resp:Increased dyspnea at rest,  GI: Denies vomiting blood, jaundice, and fecal incontinence.   Denies dysphagia or odynophagia. GU : Denies urinary burning, blood in urine, urinary frequency, urinary hesitancy, nocturnal urination, and urinary incontinence.  No renal calculi. MS: Denies joint pain, limitation of movement, and swelling, stiffness, low back pain, extremity pain. Denies muscle weakness, cramps, atrophy.  No use of non steroidal antiinflammatory drugs. Derm: Denies rash, itching, dry skin, hives, moles, warts, or unhealing ulcers.  Psych: Denies depression, anxiety, memory loss, suicidal ideation, hallucinations, paranoia, and confusion. Heme: Denies bruising, bleeding, and enlarged lymph nodes. Neuro: No headache.  No diplopia. No dysarthria.  No dysphasia.  No history of CVA.  No Seizures. No paresthesias.  No weakness. Endocrine  DM.  No Thyroid disease.  No Adrenal disease.  Physical Exam: Vital signs in last 24 hours: Temp:  [98.7 F (37.1 C)] 98.7 F (37.1 C) (01/10 1309) Pulse Rate:  [58-99] 80 (01/11 1100) Resp:  [14-28] 21 (01/11 1100) BP: (92-234)/(44-115) 174/71 (01/11 1100) SpO2:  [94 %-100 %] 99 % (01/11 1100) Weight:  [269 lb (122 kg)] 269 lb (122 kg) (01/10 1304)   General:   Ill appearing lady in some respiratory discomfort  Head:  Normocephalic and atraumatic. Eyes:  Sclera clear, no icterus.   Conjunctiva pink. Ears:  Normal auditory acuity. Nose:  No deformity,  discharge,  or lesions. Mouth:  No deformity or lesions, dentition normal. Neck:  Supple; no masses or thyromegaly. JVP elevated Lungs:  Diminished breath sounds Heart:  Regular rate and rhythm; no murmurs, clicks, rubs,  or gallops. Abdomen:  Soft, nontender and nondistended. No masses, hepatosplenomegaly or hernias noted. Normal bowel sounds, without guarding, and without rebound.   Msk:  Symmetrical without gross deformities. Normal posture. Pulses:  No carotid, renal, femoral bruits. DP and PT symmetrical and equal Extremities: edema  Neurologic:  Alert and  oriented x4;  grossly normal neurologically. Skin:  Intact without significant lesions or rashes. Cervical Nodes:  No significant cervical adenopathy. Psych:  Alert and cooperative. Normal mood and affect.  Intake/Output from previous day: 01/10 0701 - 01/11 0700 In: 50 [IV Piggyback:50] Out: 400 [Urine:400] Intake/Output this shift: Total I/O In: 50 [IV Piggyback:50] Out: -   Lab Results: Recent Labs    12/30/17 1400  WBC 14.8*  HGB 9.5*  HCT 30.5*  PLT 324   BMET Recent Labs    12/30/17 1400 12/31/17 0815  NA 140 138  K 3.9 3.6  CL 111 110  CO2 18* 21*  GLUCOSE 150* 163*  BUN 59* 55*  CREATININE 7.19* 7.43*  CALCIUM 7.9* 7.9*   LFT No results for input(s): PROT, ALBUMIN, AST, ALT, ALKPHOS, BILITOT, BILIDIR, IBILI in the last 72 hours. PT/INR No results for input(s): LABPROT, INR in the last 72 hours. Hepatitis Panel No results for input(s): HEPBSAG, HCVAB, HEPAIGM, HEPBIGM in the last 72 hours.  Studies/Results: Dg Chest 2 View  Result Date: 12/30/2017 CLINICAL DATA:  Two weeks of shortness of breath and cough. History of CHF, hypertension, diabetes former smoker. EXAM: CHEST  2 VIEW COMPARISON:  Chest x-ray of December 08, 2017 FINDINGS: The lungs are well-expanded. The interstitial markings remain increased. The pulmonary vascularity while in [place] is not as conspicuous  as on the previous  study. The cardiac silhouette remains enlarged. There is no significant pleural effusion. The observed bony thorax is unremarkable. IMPRESSION: CHF with mild interstitial edema not as conspicuous as on the previous study. No acute pneumonia. Electronically Signed   By: David  Martinique M.D.   On: 12/30/2017 13:48    Assessment/Plan:  Chronic renal failure most likely stage 5 has some acute component with her hypertensive urgency. Renal ultrasound in November showed some echogenic kidneys  Creatinine in August was 4.8   This does not look good for Mrs Allmon and would think that she will need to start dialysis for what appears volume overload. I think it reasonable to continue lasix although I shall make arrangements for perm cath placement  Hypertension  Volume overload this may improve with dialysis  Anemia  Check iron studies   Bones check PTH    LOS: 0 Jairen Goldfarb W @TODAY @11 :49 AM

## 2017-12-31 NOTE — H&P (View-Only) (Signed)
Hospital Consult    Reason for Consult:  ESRD in need of dialysis access Requesting Physician:  Dr. Justin Mend MRN #:  [id number]  History of Present Illness: This is a 57 y.o. female PMH of poorly controlled HTN, DM 2, chronic kidney disease, chronic diastolic CHF, medical noncompliance, and obesity.  Vascular surgery has been consulted for dialysis access placement.  Patient is scheduled to have interventional radiology place a tunneled dialysis catheter today to initiate dialysis.  According to patient's chart she wanted to establish care at a [facility] however upon discussion with this patient she is agreeable to having surgery here in [place].  She denies tobacco use.  She is left-hand dominant and would prefer dialysis access in right arm.  She has been admitted to the hospital for acute on chronic diastolic CHF, hypertensive urgency, and acute bronchitis.    Past Medical History:  Diagnosis Date  . Chronic diastolic CHF (congestive heart failure) ([place])    a. 03/2015: echo w/ EF of 50-55%, no WMA, Grade 2 DD, trivial AI, mild MR.  . Diabetes mellitus without complication ([place])   . Hypertension     Past Surgical History:  Procedure Laterality Date  . CARDIAC CATHETERIZATION N/A 11/02/2016   Procedure: Left Heart Cath and Coronary Angiography;  Surgeon: Sherren Mocha, MD;  Location: [facility];  Service: Cardiovascular;  Laterality: N/A;  . CESAREAN SECTION    . FRACTURE SURGERY    . TUBAL LIGATION      Allergies  Allergen Reactions  . Compazine [Prochlorperazine Edisylate] Swelling  . Prochlorperazine Swelling    Tongue swelling   . Vioxx [Rofecoxib] Other (See Comments)    Other reaction(s): Bleeding (intolerance) Bleeding    Prior to Admission medications   Medication Sig Start Date End Date Taking? Authorizing Provider  calcitRIOL (ROCALTROL) 0.25 MCG capsule Take 0.25 capsules by mouth daily. 09/13/17  Yes [provider]    hydrALAZINE (APRESOLINE) 50 MG tablet Take 1 tablet (50 mg total) by mouth 3 (three) times daily. 10/15/17  Yes Almyra Deforest, PA  labetalol (NORMODYNE) 200 MG tablet Take 400 mg by mouth 2 (two) times daily.   Yes [provider]  ondansetron (ZOFRAN-ODT) 4 MG disintegrating tablet Take 1 tablet (4 mg total) by mouth every 6 (six) hours as needed for nausea or vomiting. 10/12/17  Yes Helberg, Larkin Ina, MD  torsemide (DEMADEX) 20 MG tablet Take 40 mg by mouth 2 (two) times daily.  09/04/17  Yes [provider]    Social History   Socioeconomic History  . Marital status: Married    Spouse name: Not on file  . Number of children: Not on file  . Years of education: Not on file  . Highest education level: Not on file  Social Needs  . Financial resource strain: Not on file  . Food insecurity - worry: Not on file  . Food insecurity - inability: Not on file  . Transportation needs - medical: Not on file  . Transportation needs - non-medical: Not on file  Occupational History  . Not on file  Tobacco Use  . Smoking status: Former Smoker    Last attempt to quit: 03/22/1995    Years since quitting: 22.7  . Smokeless tobacco: Never Used  Substance and Sexual Activity  . Alcohol use: No  . Drug use: No  . Sexual activity: No    Birth control/protection: None  Other Topics Concern  . Not on file  Social History  Narrative  . Not on file     Family History  Problem Relation Age of Onset  . Diabetes Mellitus II Mother   . Hypertension Mother   . Hyperlipidemia Mother   . Kidney disease Mother   . Heart disease Father   . Heart attack Father 24  . Congestive Heart Failure Brother   . Congestive Heart Failure Son     ROS: Otherwise negative unless mentioned in HPI  Physical Examination  Vitals:   12/31/17 1432 12/31/17 1521  BP:  (!) 175/76  Pulse: 78 84  Resp: 20   Temp:    SpO2: 100%    Body mass index is 44.43 kg/m.  General:  WDWN in NAD Gait: Not  observed HENT: WNL, normocephalic Pulmonary: normal non-labored breathing, without Rales, rhonchi,  wheezing Cardiac: regular Abdomen:  soft, NT/ND, no masses Skin: without rashes Vascular Exam/Pulses: Palpable and symmetrical DP pulses; palpable and symmetrical radial pulses Extremities: without ischemic changes, without Gangrene , without cellulitis; without open wounds; edema of bilateral lower extremities to the level of the knee Musculoskeletal: no muscle wasting or atrophy  Neurologic: A&O X 3;  No focal weakness or paresthesias are detected; speech is fluent/normal Psychiatric:  The pt has Normal affect. Lymph:  Unremarkable  CBC    Component Value Date/Time   WBC 14.8 (H) 12/30/2017 1400   RBC 3.65 (L) 12/30/2017 1400   HGB 9.5 (L) 12/30/2017 1400   HCT 30.5 (L) 12/30/2017 1400   PLT 324 12/30/2017 1400   MCV 83.6 12/30/2017 1400   MCH 26.0 12/30/2017 1400   MCHC 31.1 12/30/2017 1400   RDW 15.3 12/30/2017 1400   LYMPHSABS 2.3 12/30/2017 1400   MONOABS 1.0 12/30/2017 1400   EOSABS 0.2 12/30/2017 1400   BASOSABS 0.0 12/30/2017 1400    BMET    Component Value Date/Time   NA 138 12/31/2017 0815   NA 141 10/19/2017 1533   K 3.6 12/31/2017 0815   CL 110 12/31/2017 0815   CO2 21 (L) 12/31/2017 0815   GLUCOSE 163 (H) 12/31/2017 0815   BUN 55 (H) 12/31/2017 0815   BUN 68 (H) 10/19/2017 1533   CREATININE 7.43 (H) 12/31/2017 0815   CREATININE 2.18 (H) 12/03/2016 1141   CALCIUM 7.9 (L) 12/31/2017 0815   GFRNONAA 6 (L) 12/31/2017 0815   GFRNONAA 25 (L) 12/03/2016 1141   GFRAA 6 (L) 12/31/2017 0815   GFRAA 29 (L) 12/03/2016 1141    COAGS: Lab Results  Component Value Date   INR 0.99 10/30/2016     Non-Invasive Vascular Imaging:   Vein mapping pending  Statin:  No. Beta Blocker:  Yes.   Aspirin:  Yes.   ACEI:  No. ARB:  No. CCB use:  No Other antiplatelets/anticoagulants:  No.    ASSESSMENT/PLAN: This is a 57 y.o. female in need of dialysis access  placement  Plan is for tunneled dialysis catheter placement by interventional radiology today Upper extremity vein mapping has been ordered AV fistula versus graft placement has been discussed with patient including the risks and benefits of the surgery and all questions were answered Patient would prefer dialysis access in right arm; right arm will be restricted Dr. Donzetta Matters will be evaluating patient later today and will discuss timing of access placement   Dagoberto Ligas PA-C [facility] [phone]   I have independently interviewed and examined patient and agree with PA assessment and plan above. J left hand dominant and will need right arm access having never  had Any surgery, chest, breast surgery, pacemaker placement and this is her first catheter. We'll get vein mapping in follow-up with plan for access next week if she is to stand house or can be scheduled as an outpatient but would need to have vein mapping prior to discharge.   Brandon C. Donzetta Matters, MD [facility] [place] Office: [phone] Pager: [phone]

## 2017-12-31 NOTE — Progress Notes (Signed)
HD tx completed @ 2055 w/o problem, UF goal met, blood rinsed back, VSS, report called to Hermina Barters, RN

## 2017-12-31 NOTE — Progress Notes (Signed)
HD tx initiated via HD cath w/o problem, AP pull/push/flush well w/o problem, VP sluggish pull, push/flush well, VSS, will cont to monitor while on HD tx

## 2017-12-31 NOTE — Procedures (Addendum)
Interventional Radiology Procedure Note  Procedure: Right IF 20 cm Trialysis temp HD catheter.  Tips in the RA and ready for use.   Complications: None  Estimated Blood Loss: None  Recommendations: - Routine line care  Signed,  Criselda Peaches, MD

## 2018-01-01 ENCOUNTER — Inpatient Hospital Stay (HOSPITAL_COMMUNITY): Payer: Managed Care, Other (non HMO)

## 2018-01-01 DIAGNOSIS — Z01810 Encounter for preprocedural cardiovascular examination: Secondary | ICD-10-CM

## 2018-01-01 LAB — CBC
HEMATOCRIT: 27.9 % — AB (ref 36.0–46.0)
HEMOGLOBIN: 8.7 g/dL — AB (ref 12.0–15.0)
MCH: 26.4 pg (ref 26.0–34.0)
MCHC: 31.2 g/dL (ref 30.0–36.0)
MCV: 84.8 fL (ref 78.0–100.0)
Platelets: 273 10*3/uL (ref 150–400)
RBC: 3.29 MIL/uL — AB (ref 3.87–5.11)
RDW: 15.7 % — ABNORMAL HIGH (ref 11.5–15.5)
WBC: 9.5 10*3/uL (ref 4.0–10.5)

## 2018-01-01 LAB — GLUCOSE, CAPILLARY
GLUCOSE-CAPILLARY: 157 mg/dL — AB (ref 65–99)
GLUCOSE-CAPILLARY: 175 mg/dL — AB (ref 65–99)
Glucose-Capillary: 166 mg/dL — ABNORMAL HIGH (ref 65–99)
Glucose-Capillary: 168 mg/dL — ABNORMAL HIGH (ref 65–99)

## 2018-01-01 LAB — PARATHYROID HORMONE, INTACT (NO CA): PTH: 337 pg/mL — AB (ref 15–65)

## 2018-01-01 LAB — RENAL FUNCTION PANEL
ANION GAP: 9 (ref 5–15)
Albumin: 2.5 g/dL — ABNORMAL LOW (ref 3.5–5.0)
BUN: 45 mg/dL — ABNORMAL HIGH (ref 6–20)
CHLORIDE: 108 mmol/L (ref 101–111)
CO2: 22 mmol/L (ref 22–32)
CREATININE: 6.5 mg/dL — AB (ref 0.44–1.00)
Calcium: 8 mg/dL — ABNORMAL LOW (ref 8.9–10.3)
GFR calc non Af Amer: 6 mL/min — ABNORMAL LOW (ref >60)
GFR, EST AFRICAN AMERICAN: 8 mL/min — AB (ref >60)
Glucose, Bld: 129 mg/dL — ABNORMAL HIGH (ref 65–99)
POTASSIUM: 3.8 mmol/L (ref 3.5–5.1)
Phosphorus: 5 mg/dL — ABNORMAL HIGH (ref 2.5–4.6)
Sodium: 139 mmol/L (ref 135–145)

## 2018-01-01 LAB — HEPATITIS B CORE ANTIBODY, TOTAL: Hep B Core Total Ab: NEGATIVE

## 2018-01-01 LAB — HEPATITIS B SURFACE ANTIGEN: Hepatitis B Surface Ag: NEGATIVE

## 2018-01-01 LAB — HEPATITIS B SURFACE ANTIBODY,QUALITATIVE: Hep B S Ab: NONREACTIVE

## 2018-01-01 MED ORDER — SODIUM CHLORIDE 0.9 % IV SOLN
125.0000 mg | INTRAVENOUS | Status: DC
  Administered 2018-01-02: 125 mg via INTRAVENOUS
  Filled 2018-01-01 (×4): qty 10

## 2018-01-01 MED ORDER — SODIUM CHLORIDE 0.9 % IV SOLN: 125.0000 mg | INTRAVENOUS | Status: DC

## 2018-01-01 MED ORDER — DARBEPOETIN ALFA 60 MCG/0.3ML IJ SOSY
60.0000 ug | PREFILLED_SYRINGE | INTRAMUSCULAR | Status: DC
  Administered 2018-01-02: 60 ug via INTRAVENOUS

## 2018-01-01 MED ORDER — CALCITRIOL 0.25 MCG PO CAPS
0.2500 ug | ORAL_CAPSULE | ORAL | Status: DC
  Administered 2018-01-03 – 2018-01-05 (×2): 0.25 ug via ORAL
  Filled 2018-01-01 (×2): qty 1

## 2018-01-01 NOTE — Care Management Note (Signed)
Case Management Note  Patient Details  Name: Pamela Campbell MRN: [id number] Date of Birth: [birth year]  Subjective/Objective:      CHF             Action/Plan: PCP: Dineen Kid, MD; has private insurance with [employer] with prescription drug coverage; CM following for progression of care.  Expected Discharge Date:   possibly 01/05/2018               Expected Discharge Plan:    possibly home/ self care    Status of Service:   In progress  Sherrilyn Rist [phone] 01/01/2018, 8:36 AM

## 2018-01-01 NOTE — Progress Notes (Signed)
PROGRESS NOTE   Pamela Campbell  JME:[id number]    DOB: 07-18-1961    DOA: 12/30/2017  PCP: Dineen Kid, MD   I have briefly reviewed patients previous medical records in [facility]  Brief Narrative:  57 year old female with PMH of poorly controlled HTN, DM 2, chronic kidney disease, chronic diastolic CHF, medical noncompliance, obesity, recent hospital discharge 12/21 for hypertensive urgency requiring nicardipine drip, was sent to ED from PCP office due to dyspnea, wheezing, nausea. Initial blood pressure 284/150. She also reported subjective fevers, chills, nasal congestion and mostly nonproductive cough. Exposed to adult son with URI symptoms prior to her getting sick. Admitted for acute on chronic diastolic CHF, hypertensive urgency and acute bronchitis. Cardiology and nephrology consulting.   Assessment & Plan:   Principal Problem:   Acute on chronic diastolic heart failure (HCC) Active Problems:   Cough in adult   Uncontrolled hypertension   Diabetes mellitus type 2 in obese (HCC)   CKD (chronic kidney disease), stage V (HCC)   Metabolic acidosis   Acute on chronic diastolic CHF (congestive heart failure) ([place])   1. Acute on chronic diastolic CHF: Precipitated by progressive chronic kidney disease, poorly controlled hypertension and noncompliance. 2-D echo 12/31/17: LVEF 60-65 percent and grade 1 diastolic dysfunction. Cardiac cath 10/2016: Nonobstructive CAD. Initiated hemodialysis on 1/11 and volume management across HD by nephrology. 2. Hypertensive urgency: Complicated by noncompliance. Now started on labetalol 200 MG twice a day and amlodipine 10 MG daily. Volume management across HD. Improving. 3. Acute viral bronchitis: Exposed to an adult son with URI symptoms. Chest x-ray without pneumonia. Supportive treatment. RSV panel negative. Pro-calcitonin 0.10. 4. Stage V chronic kidney disease/anabolic acidosis: Her renal functions have been rapidly declining over the past  year. Creatinine was 1.84 in November 2017 and now presented with creatinine >7. Her nausea may be related to uremia or congestive gastropathy from CHF. Nephrology consultation and follow-up appreciated. Started HD via right IJ temporary HD catheter. Vascular surgery consulting for the moment access. Dialyzed 1/11, plans to dialyze again today and then Monday. Discussed with Dr. Justin Mend. 5. Type II DM with renal complications: I2L: 7.4. Monitor CBGs and SSI. Reasonable inpatient control. 6. Anemia of chronic kidney disease: Follow CBCs. May consider Aranesp. Stable. 7. Medical noncompliance: Counseled. 8. Morbid obesity/Body mass index is 43.9 kg/m.   DVT prophylaxis: Heparin Code Status: Full Family Communication: None at bedside Disposition: DC home when medically improved, may take several days.  Patient is quite ill. She has complex multiple medical problems as stated above which will require hospital stay, close monitoring and management as indicated above for more than 2 midnights. Continue inpatient care.   Consultants:  Cardiology Nephrology  IR  Procedures:  2-D echo Right IJ temporary HD catheter placed by IR 1/11.  Antimicrobials:  None    Subjective: Still having some cough and dyspnea but no wheezing. States that she is still trying to grasp all that has happened in the last couple of days in the hospital.  ROS: No dizziness or lightheadedness.  Objective:  Vitals:   01/01/18 0356 01/01/18 0922 01/01/18 1224 01/01/18 1405  BP: (!) 147/60  (!) 155/72   Pulse: 73  71   Resp: 18  20   Temp: 98.8 F (37.1 C)  98.2 F (36.8 C)   TempSrc: Oral  Oral   SpO2: 99% 98% 100% 98%  Weight: 116 kg (255 lb 11.7 oz)     Height:  Examination:  General exam: Pleasant young female, moderately built and morbidly obese, sitting up comfortably in chair this morning. Respiratory system: Improved breath sounds. No wheezing or rhonchi. Few basal crackles. Rest of lung  fields clear to auscultation. No increased work of breathing. Cardiovascular system: S1 & S2 heard, RRR. No JVD, murmurs, rubs, gallops or clicks. 1+ pitting bilateral leg edema. Telemetry: Sinus rhythm. An episode of NSSVT on 12/31/17 at 9:16 PM. Gastrointestinal system: Abdomen is nondistended, soft and nontender. No organomegaly or masses felt. Normal bowel sounds heard. Central nervous system: Alert and oriented. No focal neurological deficits. Extremities: Symmetric 5 x 5 power. Skin: No rashes, lesions or ulcers Psychiatry: Judgement and insight appear normal. Mood & affect appropriate.     Data Reviewed: I have personally reviewed following labs and imaging studies  CBC: Recent Labs  Lab 12/30/17 1400 12/31/17 1920 01/01/18 0356  WBC 14.8* 10.5 9.5  NEUTROABS 11.2*  --   --   HGB 9.5* 8.4* 8.7*  HCT 30.5* 27.0* 27.9*  MCV 83.6 85.2 84.8  PLT 324 273 340   Basic Metabolic Panel: Recent Labs  Lab 12/30/17 1400 12/30/17 1703 12/31/17 0815 01/01/18 0356  NA 140  --  138 139  K 3.9  --  3.6 3.8  CL 111  --  110 108  CO2 18*  --  21* 22  GLUCOSE 150*  --  163* 129*  BUN 59*  --  55* 45*  CREATININE 7.19*  --  7.43* 6.50*  CALCIUM 7.9*  --  7.9* 8.0*  MG  --  1.9  --   --   PHOS  --   --   --  5.0*   Liver Function Tests: Recent Labs  Lab 12/31/17 1920 01/01/18 0356  ALT 25  --   ALBUMIN  --  2.5*   HbA1C: Recent Labs    12/30/17 1703  HGBA1C 7.4*   CBG: Recent Labs  Lab 12/31/17 1542 12/31/17 2245 01/01/18 0746 01/01/18 1115  GLUCAP 179* 132* 157* 166*    Recent Results (from the past 240 hour(s))  Respiratory Panel by PCR     Status: None   Collection Time: 12/30/17  4:22 PM  Result Value Ref Range Status   Adenovirus NOT DETECTED NOT DETECTED Final   Coronavirus 229E NOT DETECTED NOT DETECTED Final   Coronavirus HKU1 NOT DETECTED NOT DETECTED Final   Coronavirus NL63 NOT DETECTED NOT DETECTED Final   Coronavirus OC43 NOT DETECTED NOT  DETECTED Final   Metapneumovirus NOT DETECTED NOT DETECTED Final   Rhinovirus / Enterovirus NOT DETECTED NOT DETECTED Final   Influenza A NOT DETECTED NOT DETECTED Final   Influenza B NOT DETECTED NOT DETECTED Final   Parainfluenza Virus 1 NOT DETECTED NOT DETECTED Final   Parainfluenza Virus 2 NOT DETECTED NOT DETECTED Final   Parainfluenza Virus 3 NOT DETECTED NOT DETECTED Final   Parainfluenza Virus 4 NOT DETECTED NOT DETECTED Final   Respiratory Syncytial Virus NOT DETECTED NOT DETECTED Final   Bordetella pertussis NOT DETECTED NOT DETECTED Final   Chlamydophila pneumoniae NOT DETECTED NOT DETECTED Final   Mycoplasma pneumoniae NOT DETECTED NOT DETECTED Final         Radiology Studies: Ir Fluoro Guide Cv Line Right  Result Date: 12/31/2017 INDICATION: 57 year old female with end-stage renal disease in need of hemodialysis EXAM: IR RIGHT FLOURO GUIDE CV LINE; IR ULTRASOUND GUIDANCE VASC ACCESS RIGHT MEDICATIONS: None ANESTHESIA/SEDATION: None FLUOROSCOPY TIME:  Fluoroscopy Time: 0 minutes 6 seconds (2  mGy). COMPLICATIONS: None immediate. PROCEDURE: Informed written consent was obtained from the patient after a thorough discussion of the procedural risks, benefits and alternatives. All questions were addressed. Maximal Sterile Barrier Technique was utilized including caps, mask, sterile gowns, sterile gloves, sterile drape, hand hygiene and skin antiseptic. A timeout was performed prior to the initiation of the procedure. The right internal jugular vein was interrogated with ultrasound and found to be widely patent. An image was obtained and stored for the medical record. Local anesthesia was attained by infiltration with 1% lidocaine. A small dermatotomy was made. Under real-time sonographic guidance, the vessel was punctured with a 21 gauge micropuncture needle. Using standard technique, the initial micro needle was exchanged over a 0.018 micro wire for a transitional 4 [place] micro sheath.  The micro sheath was removed in the soft tissue tract dilated. A 20 [place] Trialysis catheter was then advanced over the wire and the catheter tips were positioned in the mid right atrium under fluoroscopy. An image was obtained and stored. The catheter was flushed with heparinized saline and secured to the skin with 0 Prolene suture. A sterile bandage was applied. IMPRESSION: Successful placement of a right IJ approach 20 cm Trialysis catheter. The catheter tips are in the right atrium and ready for immediate use. Electronically Signed   By: Jacqulynn Cadet M.D.   On: 12/31/2017 17:00   Ir US Guide Vasc Access Right  Result Date: 12/31/2017 INDICATION: 57 year old female with end-stage renal disease in need of hemodialysis EXAM: IR RIGHT FLOURO GUIDE CV LINE; IR ULTRASOUND GUIDANCE VASC ACCESS RIGHT MEDICATIONS: None ANESTHESIA/SEDATION: None FLUOROSCOPY TIME:  Fluoroscopy Time: 0 minutes 6 seconds (2 mGy). COMPLICATIONS: None immediate. PROCEDURE: Informed written consent was obtained from the patient after a thorough discussion of the procedural risks, benefits and alternatives. All questions were addressed. Maximal Sterile Barrier Technique was utilized including caps, mask, sterile gowns, sterile gloves, sterile drape, hand hygiene and skin antiseptic. A timeout was performed prior to the initiation of the procedure. The right internal jugular vein was interrogated with ultrasound and found to be widely patent. An image was obtained and stored for the medical record. Local anesthesia was attained by infiltration with 1% lidocaine. A small dermatotomy was made. Under real-time sonographic guidance, the vessel was punctured with a 21 gauge micropuncture needle. Using standard technique, the initial micro needle was exchanged over a 0.018 micro wire for a transitional 4 [place] micro sheath. The micro sheath was removed in the soft tissue tract dilated. A 20 [place] Trialysis catheter was then advanced over  the wire and the catheter tips were positioned in the mid right atrium under fluoroscopy. An image was obtained and stored. The catheter was flushed with heparinized saline and secured to the skin with 0 Prolene suture. A sterile bandage was applied. IMPRESSION: Successful placement of a right IJ approach 20 cm Trialysis catheter. The catheter tips are in the right atrium and ready for immediate use. Electronically Signed   By: Jacqulynn Cadet M.D.   On: 12/31/2017 17:00        Scheduled Meds: . amLODipine  10 mg Oral Daily  . [START ON 01/03/2018] calcitRIOL  0.25 mcg Oral Once per day on Mon Wed Fri  . darbepoetin (ARANESP) injection - DIALYSIS  60 mcg Intravenous Q Sat-HD  . famotidine  20 mg Oral Daily  . heparin  5,000 Units Subcutaneous Q8H  . insulin aspart  0-9 Units Subcutaneous TID WC  . ipratropium-albuterol  3 mL Nebulization Q6H  .  labetalol  200 mg Oral BID  . sodium chloride flush  3 mL Intravenous Q12H   Continuous Infusions: . sodium chloride    . sodium chloride    . sodium chloride    . ferric gluconate (FERRLECIT/NULECIT) IV       LOS: 1 day     Vernell Leep, MD, FACP, [facility] Pager [phone]  If 7PM-7AM, please contact night-coverage [email or web address] Password [facility] 01/01/2018, 3:43 PM

## 2018-01-01 NOTE — Progress Notes (Signed)
Progress Note  Patient Name: Pamela Campbell Date of Encounter: 01/01/2018  Primary Cardiologist:   Dr. Alean Rinne  Subjective   Dyspnea improving; no chest pain  Inpatient Medications    Scheduled Meds: . amLODipine  10 mg Oral Daily  . [START ON 01/03/2018] calcitRIOL  0.25 mcg Oral Once per day on Mon Wed Fri  . darbepoetin (ARANESP) injection - DIALYSIS  60 mcg Intravenous Q Sat-HD  . famotidine  20 mg Oral Daily  . heparin  5,000 Units Subcutaneous Q8H  . insulin aspart  0-9 Units Subcutaneous TID WC  . ipratropium-albuterol  3 mL Nebulization Q6H  . labetalol  200 mg Oral BID  . sodium chloride flush  3 mL Intravenous Q12H   Continuous Infusions: . sodium chloride    . sodium chloride    . sodium chloride    . ferric gluconate (FERRLECIT/NULECIT) IV     PRN Meds: sodium chloride, sodium chloride, sodium chloride, acetaminophen, albuterol, alteplase, calcium carbonate (dosed in mg elemental calcium), camphor-menthol **AND** hydrOXYzine, docusate sodium, feeding supplement (NEPRO CARB STEADY), heparin, heparin, labetalol, lidocaine (PF), lidocaine (PF), lidocaine-prilocaine, pentafluoroprop-tetrafluoroeth, sodium chloride flush, sorbitol, zolpidem   Vital Signs    Vitals:   01/01/18 0109 01/01/18 0356 01/01/18 0922 01/01/18 1224  BP:  (!) 147/60  (!) 155/72  Pulse:  73  71  Resp:  18  20  Temp:  98.8 F (37.1 C)  98.2 F (36.8 C)  TempSrc:  Oral  Oral  SpO2: 96% 99% 98% 100%  Weight:  255 lb 11.7 oz (116 kg)    Height:        Intake/Output Summary (Last 24 hours) at 01/01/2018 1301 Last data filed at 01/01/2018 0913 Gross per 24 hour  Intake 290 ml  Output 3900 ml  Net -3610 ml   Filed Weights   12/31/17 1844 12/31/17 2102 01/01/18 0356  Weight: 268 lb 1.3 oz (121.6 kg) 261 lb 7.5 oz (118.6 kg) 255 lb 11.7 oz (116 kg)     Physical Exam   General: Well developed, obese female  NAD Head: Normal Neck: Supple Lungs:  Mildly diminished BS bases Heart:  RRR Abdomen: Soft, NT/ND Extremities: trace edema Neuro: grossly intact   Labs    Chemistry Recent Labs  Lab 12/30/17 1400 12/31/17 0815 12/31/17 1920 01/01/18 0356  NA 140 138  --  139  K 3.9 3.6  --  3.8  CL 111 110  --  108  CO2 18* 21*  --  22  GLUCOSE 150* 163*  --  129*  BUN 59* 55*  --  45*  CREATININE 7.19* 7.43*  --  6.50*  CALCIUM 7.9* 7.9*  --  8.0*  ALBUMIN  --   --   --  2.5*  ALT  --   --  25  --   GFRNONAA 6* 6*  --  6*  GFRAA 7* 6*  --  8*  ANIONGAP 11 7  --  9     Hematology Recent Labs  Lab 12/30/17 1400 12/31/17 1920 01/01/18 0356  WBC 14.8* 10.5 9.5  RBC 3.65* 3.17* 3.29*  HGB 9.5* 8.4* 8.7*  HCT 30.5* 27.0* 27.9*  MCV 83.6 85.2 84.8  MCH 26.0 26.5 26.4  MCHC 31.1 31.1 31.2  RDW 15.3 15.7* 15.7*  PLT 324 273 273     Recent Labs  Lab 12/30/17 1414  TROPIPOC 0.05     BNP Recent Labs  Lab 12/30/17 1400  BNP 446.8*  Radiology    Dg Chest 2 View  Result Date: 12/30/2017 CLINICAL DATA:  Two weeks of shortness of breath and cough. History of CHF, hypertension, diabetes former smoker. EXAM: CHEST  2 VIEW COMPARISON:  Chest x-ray of December 08, 2017 FINDINGS: The lungs are well-expanded. The interstitial markings remain increased. The pulmonary vascularity while in [place] is not as conspicuous as on the previous study. The cardiac silhouette remains enlarged. There is no significant pleural effusion. The observed bony thorax is unremarkable. IMPRESSION: CHF with mild interstitial edema not as conspicuous as on the previous study. No acute pneumonia. Electronically Signed   By: David  [place]ique M.D.   On: 12/30/2017 13:48   Ir Fluoro Guide Cv Line Right  Result Date: 12/31/2017 INDICATION: 57 year old female with end-stage renal disease in need of hemodialysis EXAM: IR RIGHT FLOURO GUIDE CV LINE; IR ULTRASOUND GUIDANCE VASC ACCESS RIGHT MEDICATIONS: None ANESTHESIA/SEDATION: None FLUOROSCOPY TIME:  Fluoroscopy Time: 0 minutes 6  seconds (2 mGy). COMPLICATIONS: None immediate. PROCEDURE: Informed written consent was obtained from the patient after a thorough discussion of the procedural risks, benefits and alternatives. All questions were addressed. Maximal Sterile Barrier Technique was utilized including caps, mask, sterile gowns, sterile gloves, sterile drape, hand hygiene and skin antiseptic. A timeout was performed prior to the initiation of the procedure. The right internal jugular vein was interrogated with ultrasound and found to be widely patent. An image was obtained and stored for the medical record. Local anesthesia was attained by infiltration with 1% lidocaine. A small dermatotomy was made. Under real-time sonographic guidance, the vessel was punctured with a 21 gauge micropuncture needle. Using standard technique, the initial micro needle was exchanged over a 0.018 micro wire for a transitional 4 [place] micro sheath. The micro sheath was removed in the soft tissue tract dilated. A 20 [place] Trialysis catheter was then advanced over the wire and the catheter tips were positioned in the mid right atrium under fluoroscopy. An image was obtained and stored. The catheter was flushed with heparinized saline and secured to the skin with 0 Prolene suture. A sterile bandage was applied. IMPRESSION: Successful placement of a right IJ approach 20 cm Trialysis catheter. The catheter tips are in the right atrium and ready for immediate use. Electronically Signed   By: Jacqulynn Cadet M.D.   On: 12/31/2017 17:00   Ir US Guide Vasc Access Right  Result Date: 12/31/2017 INDICATION: 57 year old female with end-stage renal disease in need of hemodialysis EXAM: IR RIGHT FLOURO GUIDE CV LINE; IR ULTRASOUND GUIDANCE VASC ACCESS RIGHT MEDICATIONS: None ANESTHESIA/SEDATION: None FLUOROSCOPY TIME:  Fluoroscopy Time: 0 minutes 6 seconds (2 mGy). COMPLICATIONS: None immediate. PROCEDURE: Informed written consent was obtained from the patient after  a thorough discussion of the procedural risks, benefits and alternatives. All questions were addressed. Maximal Sterile Barrier Technique was utilized including caps, mask, sterile gowns, sterile gloves, sterile drape, hand hygiene and skin antiseptic. A timeout was performed prior to the initiation of the procedure. The right internal jugular vein was interrogated with ultrasound and found to be widely patent. An image was obtained and stored for the medical record. Local anesthesia was attained by infiltration with 1% lidocaine. A small dermatotomy was made. Under real-time sonographic guidance, the vessel was punctured with a 21 gauge micropuncture needle. Using standard technique, the initial micro needle was exchanged over a 0.018 micro wire for a transitional 4 [place] micro sheath. The micro sheath was removed in the soft tissue tract dilated. A 20 [place] Trialysis  catheter was then advanced over the wire and the catheter tips were positioned in the mid right atrium under fluoroscopy. An image was obtained and stored. The catheter was flushed with heparinized saline and secured to the skin with 0 Prolene suture. A sterile bandage was applied. IMPRESSION: Successful placement of a right IJ approach 20 cm Trialysis catheter. The catheter tips are in the right atrium and ready for immediate use. Electronically Signed   By: Jacqulynn Cadet M.D.   On: 12/31/2017 17:00     Telemetry    NSR - Personally Reviewed   Cardiac Studies   LHC 10/2016 Coronary Findings   Diagnostic  Dominance: Right  Left Main  Vessel is angiographically normal.  Left Anterior Descending  Prox LAD lesion 20% stenosed  There is very mild narrowing at the LAD ostium compared with the large caliber of the proximal LAD. There are no flow obstructive lesions.  Left Circumflex  Mid Cx lesion 30% stenosed  Mid Cx lesion.  Right Coronary Artery  Large, dominant vessel. The vessel gives off several branches to the PDA and  PLA, both of which supply a large territory. The vessel is smooth and appears angiographically normal.   Conclusion  1. Mild nonobstructive coronary artery disease 2. Elevated LVEDP   2D Echo 10/27/2015 Study Conclusions  - Left ventricle: The cavity size was normal. Wall thickness was increased in a pattern of moderate LVH. Systolic function was normal. The estimated ejection fraction was in the range of 55% to 60%. Wall motion was normal; there were no regional wall motion abnormalities. Features are consistent with a pseudonormal left ventricular filling pattern, with concomitant abnormal relaxation and increased filling pressure (grade 2 diastolic dysfunction). - Aortic valve: Trileaflet; mildly thickened, mildly calcified leaflets. - Mitral valve: There was trivial regurgitation. - Left atrium: The atrium was mildly dilated.   Patient Profile     58 y.o. female with a hx of chronic diastolic HF, mild nonobstructive CAD by cath in 2017, HTN, HLD, type 2 DM, stage III CKD and h/o medication noncompliance, who is being seen for acute on chronic diastolic HF in the setting of hypertensive urgency,  Assessment & Plan    1. Hypertensive urgency-blood pressure is improving.  Continue amlodipine and labetalol.  Blood pressure should continue to improve as dialysis initiated yesterday.   2. Acute on chronic diastolic heart failure - echo shows normal LV function. -Volume removal per dialysis.   3. CKD  - per nephrology  Signed, Kirk Ruths , MD  1:01 PM 01/01/2018

## 2018-01-01 NOTE — Progress Notes (Signed)
[facility] ROUNDING NOTE   Subjective:   57 year old lady diastolic heart failure and congestive heart failure and diabetes that was admitted with acute respiratory compromise  She has stage 5 chronic renal disease and has been not agreeing to dialysis in the past  She was fluid overloaded and in pulmonary edema and plans were made for access   She has a temporary IJ and will need permanent access next week  Objective:  Vital signs in last 24 hours:  Temp:  [98.4 F (36.9 C)-99.3 F (37.4 C)] 98.8 F (37.1 C) (01/12 0356) Pulse Rate:  [71-84] 73 (01/12 0356) Resp:  [18-26] 18 (01/12 0356) BP: (111-194)/(55-83) 147/60 (01/12 0356) SpO2:  [96 %-100 %] 98 % (01/12 0922) Weight:  [255 lb 11.7 oz (116 kg)-268 lb 1.3 oz (121.6 kg)] 255 lb 11.7 oz (116 kg) (01/12 0356)  Weight change: -2.9 oz (-4.618 kg) Filed Weights   12/31/17 1844 12/31/17 2102 01/01/18 0356  Weight: 268 lb 1.3 oz (121.6 kg) 261 lb 7.5 oz (118.6 kg) 255 lb 11.7 oz (116 kg)    Intake/Output: I/O last 3 completed shifts: In: 150 [IV Piggyback:150] Out: [place] [Urine:2050; Other:3000]   Intake/Output this shift:  Total I/O In: 240 [P.O.:240] Out: -   CVS- RRR RS-  Diminished air entry ABD- BS present soft non-distended EXT-  ++Edema   Basic Metabolic Panel: Recent Labs  Lab 12/30/17 1400 12/30/17 1703 12/31/17 0815 01/01/18 0356  NA 140  --  138 139  K 3.9  --  3.6 3.8  CL 111  --  110 108  CO2 18*  --  21* 22  GLUCOSE 150*  --  163* 129*  BUN 59*  --  55* 45*  CREATININE 7.19*  --  7.43* 6.50*  CALCIUM 7.9*  --  7.9* 8.0*  MG  --  1.9  --   --   PHOS  --   --   --  5.0*    Liver Function Tests: Recent Labs  Lab 12/31/17 1920 01/01/18 0356  ALT 25  --   ALBUMIN  --  2.5*   No results for input(s): LIPASE, AMYLASE in the last 168 hours. No results for input(s): AMMONIA in the last 168 hours.  CBC: Recent Labs  Lab 12/30/17 1400 12/31/17 1920 01/01/18 0356  WBC  14.8* 10.5 9.5  NEUTROABS 11.2*  --   --   HGB 9.5* 8.4* 8.7*  HCT 30.5* 27.0* 27.9*  MCV 83.6 85.2 84.8  PLT 324 273 273    Cardiac Enzymes: No results for input(s): CKTOTAL, CKMB, CKMBINDEX, TROPONINI in the last 168 hours.  BNP: Invalid input(s): POCBNP  CBG: Recent Labs  Lab 12/31/17 1542 12/31/17 2245 01/01/18 0746  GLUCAP 179* 132* 157*    Microbiology: Results for orders placed or performed during the hospital encounter of 12/30/17  Respiratory Panel by PCR     Status: None   Collection Time: 12/30/17  4:22 PM  Result Value Ref Range Status   Adenovirus NOT DETECTED NOT DETECTED Final   Coronavirus 229E NOT DETECTED NOT DETECTED Final   Coronavirus HKU1 NOT DETECTED NOT DETECTED Final   Coronavirus NL63 NOT DETECTED NOT DETECTED Final   Coronavirus OC43 NOT DETECTED NOT DETECTED Final   Metapneumovirus NOT DETECTED NOT DETECTED Final   Rhinovirus / Enterovirus NOT DETECTED NOT DETECTED Final   Influenza A NOT DETECTED NOT DETECTED Final   Influenza B NOT DETECTED NOT DETECTED Final   Parainfluenza Virus  1 NOT DETECTED NOT DETECTED Final   Parainfluenza Virus 2 NOT DETECTED NOT DETECTED Final   Parainfluenza Virus 3 NOT DETECTED NOT DETECTED Final   Parainfluenza Virus 4 NOT DETECTED NOT DETECTED Final   Respiratory Syncytial Virus NOT DETECTED NOT DETECTED Final   Bordetella pertussis NOT DETECTED NOT DETECTED Final   Chlamydophila pneumoniae NOT DETECTED NOT DETECTED Final   Mycoplasma pneumoniae NOT DETECTED NOT DETECTED Final    Coagulation Studies: No results for input(s): LABPROT, INR in the last 72 hours.  Urinalysis: No results for input(s): COLORURINE, LABSPEC, PHURINE, GLUCOSEU, HGBUR, BILIRUBINUR, KETONESUR, PROTEINUR, UROBILINOGEN, NITRITE, LEUKOCYTESUR in the last 72 hours.  Invalid input(s): APPERANCEUR    Imaging: Dg Chest 2 View  Result Date: 12/30/2017 CLINICAL DATA:  Two weeks of shortness of breath and cough. History of CHF,  hypertension, diabetes former smoker. EXAM: CHEST  2 VIEW COMPARISON:  Chest x-ray of December 08, 2017 FINDINGS: The lungs are well-expanded. The interstitial markings remain increased. The pulmonary vascularity while in [place] is not as conspicuous as on the previous study. The cardiac silhouette remains enlarged. There is no significant pleural effusion. The observed bony thorax is unremarkable. IMPRESSION: CHF with mild interstitial edema not as conspicuous as on the previous study. No acute pneumonia. Electronically Signed   By: David  Martinique M.D.   On: 12/30/2017 13:48   Ir Fluoro Guide Cv Line Right  Result Date: 12/31/2017 INDICATION: 57 year old female with end-stage renal disease in need of hemodialysis EXAM: IR RIGHT FLOURO GUIDE CV LINE; IR ULTRASOUND GUIDANCE VASC ACCESS RIGHT MEDICATIONS: None ANESTHESIA/SEDATION: None FLUOROSCOPY TIME:  Fluoroscopy Time: 0 minutes 6 seconds (2 mGy). COMPLICATIONS: None immediate. PROCEDURE: Informed written consent was obtained from the patient after a thorough discussion of the procedural risks, benefits and alternatives. All questions were addressed. Maximal Sterile Barrier Technique was utilized including caps, mask, sterile gowns, sterile gloves, sterile drape, hand hygiene and skin antiseptic. A timeout was performed prior to the initiation of the procedure. The right internal jugular vein was interrogated with ultrasound and found to be widely patent. An image was obtained and stored for the medical record. Local anesthesia was attained by infiltration with 1% lidocaine. A small dermatotomy was made. Under real-time sonographic guidance, the vessel was punctured with a 21 gauge micropuncture needle. Using standard technique, the initial micro needle was exchanged over a 0.018 micro wire for a transitional 4 [place] micro sheath. The micro sheath was removed in the soft tissue tract dilated. A 20 [place] Trialysis catheter was then advanced over the wire  and the catheter tips were positioned in the mid right atrium under fluoroscopy. An image was obtained and stored. The catheter was flushed with heparinized saline and secured to the skin with 0 Prolene suture. A sterile bandage was applied. IMPRESSION: Successful placement of a right IJ approach 20 cm Trialysis catheter. The catheter tips are in the right atrium and ready for immediate use. Electronically Signed   By: Jacqulynn Cadet M.D.   On: 12/31/2017 17:00   Ir US Guide Vasc Access Right  Result Date: 12/31/2017 INDICATION: 57 year old female with end-stage renal disease in need of hemodialysis EXAM: IR RIGHT FLOURO GUIDE CV LINE; IR ULTRASOUND GUIDANCE VASC ACCESS RIGHT MEDICATIONS: None ANESTHESIA/SEDATION: None FLUOROSCOPY TIME:  Fluoroscopy Time: 0 minutes 6 seconds (2 mGy). COMPLICATIONS: None immediate. PROCEDURE: Informed written consent was obtained from the patient after a thorough discussion of the procedural risks, benefits and alternatives. All questions were addressed. Maximal Sterile Barrier Technique was  utilized including caps, mask, sterile gowns, sterile gloves, sterile drape, hand hygiene and skin antiseptic. A timeout was performed prior to the initiation of the procedure. The right internal jugular vein was interrogated with ultrasound and found to be widely patent. An image was obtained and stored for the medical record. Local anesthesia was attained by infiltration with 1% lidocaine. A small dermatotomy was made. Under real-time sonographic guidance, the vessel was punctured with a 21 gauge micropuncture needle. Using standard technique, the initial micro needle was exchanged over a 0.018 micro wire for a transitional 4 [place] micro sheath. The micro sheath was removed in the soft tissue tract dilated. A 20 [place] Trialysis catheter was then advanced over the wire and the catheter tips were positioned in the mid right atrium under fluoroscopy. An image was obtained and stored. The  catheter was flushed with heparinized saline and secured to the skin with 0 Prolene suture. A sterile bandage was applied. IMPRESSION: Successful placement of a right IJ approach 20 cm Trialysis catheter. The catheter tips are in the right atrium and ready for immediate use. Electronically Signed   By: Jacqulynn Cadet M.D.   On: 12/31/2017 17:00     Medications:   . sodium chloride    . sodium chloride    . sodium chloride    . furosemide 160 mg (01/01/18 0841)   . amLODipine  10 mg Oral Daily  . famotidine  20 mg Oral Daily  . heparin  5,000 Units Subcutaneous Q8H  . insulin aspart  0-9 Units Subcutaneous TID WC  . ipratropium-albuterol  3 mL Nebulization Q6H  . labetalol  200 mg Oral BID  . sodium chloride flush  3 mL Intravenous Q12H   sodium chloride, sodium chloride, sodium chloride, acetaminophen, albuterol, alteplase, calcium carbonate (dosed in mg elemental calcium), camphor-menthol **AND** hydrOXYzine, docusate sodium, feeding supplement (NEPRO CARB STEADY), heparin, heparin, labetalol, lidocaine (PF), lidocaine (PF), lidocaine-prilocaine, pentafluoroprop-tetrafluoroeth, sodium chloride flush, sorbitol, zolpidem  Assessment/ Plan:   Chronic renal failure most likely stage 5 has some acute component with her hypertensive urgency. Renal ultrasound in November showed some echogenic kidneys  Creatinine in August was 4.8     Placement of temporary catheter  Appreciate Dr Donzetta Matters helping with access planning  Hypertension  Volume overload    Improved  with dialysis  Anemia   start IV iron   Bones   PTH > 300 start oral calcitriol       LOS: 1 Pamela Campbell W @TODAY @10 :18 AM

## 2018-01-01 NOTE — Progress Notes (Signed)
Bilateral  Upper Extremity Vein Map  Right Cephalic Segment Diameter Depth Comment  Axilla 4.9 mm 15.74mm   1. Prox upper 4.9 mm 15.0 mm branch  2. Mid upper arm 5.8 mm 12.1 mm branch  3. Above AC 5.9 mm 7.5 mm   4. AC 8.3 mm 4.8 mm   5. Below AC 4.9 mm 10.8 mm branch  6. Mid forearm 4.0 mm 6.8 mm branch  7. Dist FA 3.7 mm 4.1 mm   Wrist 2.3 mm 2.2 mm    Right Basilic Segment Diameter Depth Comment  Axilla mm mm   1. Prox upper mm mm   2. Mid upper arm 4.2 mm 28.4 mm   3. Above AC 3.8 mm 16.0 mm branch  4. AC 3.0 mm 5.4 mm branch  5. Below AC 2.7 mm 5.9 mm branch  6. Mid forearm 1.7 mm 2.7 mm branch  7. Dist FA 1.7 mm 1.7 mm branch  Wrist mm mm    Left Cephalic Segment Diameter Depth Comment  Axilla 4.4 mm 17.2 mm   1. Prox upper 5.2 mm 14.3 mm branch  2. Mid upper arm 5.6 mm 11.1 mm branch  3. Above AC 6.2 mm 6.4 mm branch  4. AC 7.1 mm 5.5 mm IV noted  5. Below AC mm mm Not visualized, IV tape  6. Mid forearm 4.1 mm 8.6 mm   7. Dist FA 3.3 mm 5.2 mm   Wrist mm mm    Lita Mains- RDMS, RVT 10:58 AM  01/01/2018

## 2018-01-02 LAB — GLUCOSE, CAPILLARY
GLUCOSE-CAPILLARY: 185 mg/dL — AB (ref 65–99)
Glucose-Capillary: 119 mg/dL — ABNORMAL HIGH (ref 65–99)
Glucose-Capillary: 210 mg/dL — ABNORMAL HIGH (ref 65–99)

## 2018-01-02 LAB — RENAL FUNCTION PANEL
ANION GAP: 12 (ref 5–15)
Albumin: 2.5 g/dL — ABNORMAL LOW (ref 3.5–5.0)
BUN: 50 mg/dL — ABNORMAL HIGH (ref 6–20)
CALCIUM: 8.3 mg/dL — AB (ref 8.9–10.3)
CHLORIDE: 104 mmol/L (ref 101–111)
CO2: 21 mmol/L — AB (ref 22–32)
CREATININE: 7.12 mg/dL — AB (ref 0.44–1.00)
GFR calc Af Amer: 7 mL/min — ABNORMAL LOW (ref >60)
GFR, EST NON AFRICAN AMERICAN: 6 mL/min — AB (ref >60)
Glucose, Bld: 201 mg/dL — ABNORMAL HIGH (ref 65–99)
Phosphorus: 5.3 mg/dL — ABNORMAL HIGH (ref 2.5–4.6)
Potassium: 3.3 mmol/L — ABNORMAL LOW (ref 3.5–5.1)
Sodium: 137 mmol/L (ref 135–145)

## 2018-01-02 LAB — CBC
HCT: 28.2 % — ABNORMAL LOW (ref 36.0–46.0)
HEMOGLOBIN: 8.8 g/dL — AB (ref 12.0–15.0)
MCH: 26.3 pg (ref 26.0–34.0)
MCHC: 31.2 g/dL (ref 30.0–36.0)
MCV: 84.2 fL (ref 78.0–100.0)
PLATELETS: 282 10*3/uL (ref 150–400)
RBC: 3.35 MIL/uL — AB (ref 3.87–5.11)
RDW: 15.6 % — ABNORMAL HIGH (ref 11.5–15.5)
WBC: 11 10*3/uL — AB (ref 4.0–10.5)

## 2018-01-02 MED ORDER — HEPARIN SODIUM (PORCINE) 1000 UNIT/ML DIALYSIS: 1000.0000 [IU] | INTRAMUSCULAR | Status: DC | PRN

## 2018-01-02 MED ORDER — PENTAFLUOROPROP-TETRAFLUOROETH EX AERO: 1.0000 "application " | INHALATION_SPRAY | CUTANEOUS | Status: DC | PRN

## 2018-01-02 MED ORDER — SODIUM CHLORIDE 0.9 % IV SOLN: 100.0000 mL | INTRAVENOUS | Status: DC | PRN

## 2018-01-02 MED ORDER — LIDOCAINE-PRILOCAINE 2.5-2.5 % EX CREA: 1.0000 "application " | TOPICAL_CREAM | CUTANEOUS | Status: DC | PRN

## 2018-01-02 MED ORDER — DARBEPOETIN ALFA 60 MCG/0.3ML IJ SOSY
PREFILLED_SYRINGE | INTRAMUSCULAR | Status: AC
  Filled 2018-01-02: qty 0.3

## 2018-01-02 MED ORDER — LIDOCAINE HCL (PF) 1 % IJ SOLN: 5.0000 mL | INTRAMUSCULAR | Status: DC | PRN

## 2018-01-02 MED ORDER — ALTEPLASE 2 MG IJ SOLR: 2.0000 mg | Freq: Once | INTRAMUSCULAR | Status: DC | PRN

## 2018-01-02 NOTE — Progress Notes (Signed)
  Progress Note  She has adequate vein for avf creation on her non-dominant right arm. Will exchange for tunneled catheter at same time. Aiming for Tuesday.  Peytyn Trine C. Donzetta Matters, MD [facility] Office: [phone] Pager: [phone]  01/02/2018 7:44 AM

## 2018-01-02 NOTE — Progress Notes (Signed)
[facility] ROUNDING NOTE   Subjective:   57 year old lady diastolic heart failure and congestive heart failure and diabetes that was admitted with acute respiratory compromise  She has stage 5 chronic renal disease and has been not agreeing to dialysis in the past  She was fluid overloaded and in pulmonary edema and plans were made for access   She has a temporary IJ and will need permanent access next week  She had dialysis treatment #1  1/11 and scheduled for dialysis #2 1/13  I will order a third treatment for Monday 1/14  Objective:  Vital signs in last 24 hours:  Temp:  [98.2 F (36.8 C)-98.7 F (37.1 C)] 98.7 F (37.1 C) (01/13 0456) Pulse Rate:  [71-79] 79 (01/13 0456) Resp:  [20] 20 (01/13 0456) BP: (155-188)/(72-82) 186/81 (01/13 0456) SpO2:  [94 %-100 %] 94 % (01/13 0456) Weight:  [260 lb (117.9 kg)] 260 lb (117.9 kg) (01/13 0456)  Weight change: 1 lb 2.9 oz (0.535 kg) Filed Weights   12/31/17 2102 01/01/18 0356 01/02/18 0456  Weight: 261 lb 7.5 oz (118.6 kg) 255 lb 11.7 oz (116 kg) 260 lb (117.9 kg)    Intake/Output: I/O last 3 completed shifts: In: 950 [P.O.:900; IV Piggyback:50] Out: 0998 [Urine:2650; Other:3000]   Intake/Output this shift:  Total I/O In: 240 [P.O.:240] Out: -   CVS- RRR RS-  Diminished air entry ABD- BS present soft non-distended EXT-  ++Edema   Basic Metabolic Panel: Recent Labs  Lab 12/30/17 1400 12/30/17 1703 12/31/17 0815 01/01/18 0356  NA 140  --  138 139  K 3.9  --  3.6 3.8  CL 111  --  110 108  CO2 18*  --  21* 22  GLUCOSE 150*  --  163* 129*  BUN 59*  --  55* 45*  CREATININE 7.19*  --  7.43* 6.50*  CALCIUM 7.9*  --  7.9* 8.0*  MG  --  1.9  --   --   PHOS  --   --   --  5.0*    Liver Function Tests: Recent Labs  Lab 12/31/17 1920 01/01/18 0356  ALT 25  --   ALBUMIN  --  2.5*   No results for input(s): LIPASE, AMYLASE in the last 168 hours. No results for input(s): AMMONIA in the last 168  hours.  CBC: Recent Labs  Lab 12/30/17 1400 12/31/17 1920 01/01/18 0356  WBC 14.8* 10.5 9.5  NEUTROABS 11.2*  --   --   HGB 9.5* 8.4* 8.7*  HCT 30.5* 27.0* 27.9*  MCV 83.6 85.2 84.8  PLT 324 273 273    Cardiac Enzymes: No results for input(s): CKTOTAL, CKMB, CKMBINDEX, TROPONINI in the last 168 hours.  BNP: Invalid input(s): POCBNP  CBG: Recent Labs  Lab 01/01/18 0746 01/01/18 1115 01/01/18 1701 01/01/18 2131 01/02/18 0734  GLUCAP 157* 166* 175* 168* 119*    Microbiology: Results for orders placed or performed during the hospital encounter of 12/30/17  Respiratory Panel by PCR     Status: None   Collection Time: 12/30/17  4:22 PM  Result Value Ref Range Status   Adenovirus NOT DETECTED NOT DETECTED Final   Coronavirus 229E NOT DETECTED NOT DETECTED Final   Coronavirus HKU1 NOT DETECTED NOT DETECTED Final   Coronavirus NL63 NOT DETECTED NOT DETECTED Final   Coronavirus OC43 NOT DETECTED NOT DETECTED Final   Metapneumovirus NOT DETECTED NOT DETECTED Final   Rhinovirus / Enterovirus NOT DETECTED NOT DETECTED Final  Influenza A NOT DETECTED NOT DETECTED Final   Influenza B NOT DETECTED NOT DETECTED Final   Parainfluenza Virus 1 NOT DETECTED NOT DETECTED Final   Parainfluenza Virus 2 NOT DETECTED NOT DETECTED Final   Parainfluenza Virus 3 NOT DETECTED NOT DETECTED Final   Parainfluenza Virus 4 NOT DETECTED NOT DETECTED Final   Respiratory Syncytial Virus NOT DETECTED NOT DETECTED Final   Bordetella pertussis NOT DETECTED NOT DETECTED Final   Chlamydophila pneumoniae NOT DETECTED NOT DETECTED Final   Mycoplasma pneumoniae NOT DETECTED NOT DETECTED Final    Coagulation Studies: No results for input(s): LABPROT, INR in the last 72 hours.  Urinalysis: No results for input(s): COLORURINE, LABSPEC, PHURINE, GLUCOSEU, HGBUR, BILIRUBINUR, KETONESUR, PROTEINUR, UROBILINOGEN, NITRITE, LEUKOCYTESUR in the last 72 hours.  Invalid input(s): APPERANCEUR    Imaging: Ir  Fluoro Guide Cv Line Right  Result Date: 12/31/2017 INDICATION: 57 year old female with end-stage renal disease in need of hemodialysis EXAM: IR RIGHT FLOURO GUIDE CV LINE; IR ULTRASOUND GUIDANCE VASC ACCESS RIGHT MEDICATIONS: None ANESTHESIA/SEDATION: None FLUOROSCOPY TIME:  Fluoroscopy Time: 0 minutes 6 seconds (2 mGy). COMPLICATIONS: None immediate. PROCEDURE: Informed written consent was obtained from the patient after a thorough discussion of the procedural risks, benefits and alternatives. All questions were addressed. Maximal Sterile Barrier Technique was utilized including caps, mask, sterile gowns, sterile gloves, sterile drape, hand hygiene and skin antiseptic. A timeout was performed prior to the initiation of the procedure. The right internal jugular vein was interrogated with ultrasound and found to be widely patent. An image was obtained and stored for the medical record. Local anesthesia was attained by infiltration with 1% lidocaine. A small dermatotomy was made. Under real-time sonographic guidance, the vessel was punctured with a 21 gauge micropuncture needle. Using standard technique, the initial micro needle was exchanged over a 0.018 micro wire for a transitional 4 [place] micro sheath. The micro sheath was removed in the soft tissue tract dilated. A 20 [place] Trialysis catheter was then advanced over the wire and the catheter tips were positioned in the mid right atrium under fluoroscopy. An image was obtained and stored. The catheter was flushed with heparinized saline and secured to the skin with 0 Prolene suture. A sterile bandage was applied. IMPRESSION: Successful placement of a right IJ approach 20 cm Trialysis catheter. The catheter tips are in the right atrium and ready for immediate use. Electronically Signed   By: Jacqulynn Cadet M.D.   On: 12/31/2017 17:00   Ir US Guide Vasc Access Right  Result Date: 12/31/2017 INDICATION: 57 year old female with end-stage renal disease in  need of hemodialysis EXAM: IR RIGHT FLOURO GUIDE CV LINE; IR ULTRASOUND GUIDANCE VASC ACCESS RIGHT MEDICATIONS: None ANESTHESIA/SEDATION: None FLUOROSCOPY TIME:  Fluoroscopy Time: 0 minutes 6 seconds (2 mGy). COMPLICATIONS: None immediate. PROCEDURE: Informed written consent was obtained from the patient after a thorough discussion of the procedural risks, benefits and alternatives. All questions were addressed. Maximal Sterile Barrier Technique was utilized including caps, mask, sterile gowns, sterile gloves, sterile drape, hand hygiene and skin antiseptic. A timeout was performed prior to the initiation of the procedure. The right internal jugular vein was interrogated with ultrasound and found to be widely patent. An image was obtained and stored for the medical record. Local anesthesia was attained by infiltration with 1% lidocaine. A small dermatotomy was made. Under real-time sonographic guidance, the vessel was punctured with a 21 gauge micropuncture needle. Using standard technique, the initial micro needle was exchanged over a 0.018 micro wire for  a transitional 4 [place] micro sheath. The micro sheath was removed in the soft tissue tract dilated. A 20 [place] Trialysis catheter was then advanced over the wire and the catheter tips were positioned in the mid right atrium under fluoroscopy. An image was obtained and stored. The catheter was flushed with heparinized saline and secured to the skin with 0 Prolene suture. A sterile bandage was applied. IMPRESSION: Successful placement of a right IJ approach 20 cm Trialysis catheter. The catheter tips are in the right atrium and ready for immediate use. Electronically Signed   By: Jacqulynn Cadet M.D.   On: 12/31/2017 17:00     Medications:   . sodium chloride    . sodium chloride    . sodium chloride    . ferric gluconate (FERRLECIT/NULECIT) IV     . amLODipine  10 mg Oral Daily  . [START ON 01/03/2018] calcitRIOL  0.25 mcg Oral Once per day on Mon  Wed Fri  . darbepoetin (ARANESP) injection - DIALYSIS  60 mcg Intravenous Q Sat-HD  . famotidine  20 mg Oral Daily  . heparin  5,000 Units Subcutaneous Q8H  . insulin aspart  0-9 Units Subcutaneous TID WC  . ipratropium-albuterol  3 mL Nebulization Q6H  . labetalol  200 mg Oral BID  . sodium chloride flush  3 mL Intravenous Q12H   sodium chloride, sodium chloride, sodium chloride, acetaminophen, albuterol, alteplase, calcium carbonate (dosed in mg elemental calcium), camphor-menthol **AND** hydrOXYzine, docusate sodium, feeding supplement (NEPRO CARB STEADY), heparin, heparin, labetalol, lidocaine (PF), lidocaine (PF), lidocaine-prilocaine, pentafluoroprop-tetrafluoroeth, sodium chloride flush, sorbitol, zolpidem  Assessment/ Plan:   Chronic renal failure most likely stage 5 has some acute component with her hypertensive urgency. Renal ultrasound in November showed some echogenic kidneys  Creatinine in August was 4.8     Placement of temporary catheter  Appreciate Dr Donzetta Matters helping with access planning  Hypertension  Volume overload    Improved  with dialysis  #2 today  Anemia   start IV iron   Bones   PTH > 300 start oral calcitriol      LOS: 2 Pamela Campbell W @TODAY @9 :25 AM

## 2018-01-02 NOTE — Progress Notes (Signed)
PROGRESS NOTE   Pamela Campbell  PJA:[id number]    DOB: Aug 03, 1961    DOA: 12/30/2017  PCP: Dineen Kid, MD   I have briefly reviewed patients previous medical records in [facility].  Brief Narrative:  57 year old female with PMH of poorly controlled HTN, DM 2, chronic kidney disease, chronic diastolic CHF, medical noncompliance, obesity, recent hospital discharge 12/21 for hypertensive urgency requiring nicardipine drip, was sent to ED from PCP office due to dyspnea, wheezing, nausea. Initial blood pressure 284/150. She also reported subjective fevers, chills, nasal congestion and mostly nonproductive cough. Exposed to adult son with URI symptoms prior to her getting sick. Admitted for acute on chronic diastolic CHF, hypertensive urgency and acute bronchitis. Cardiology and nephrology consulting.   Assessment & Plan:   Principal Problem:   Acute on chronic diastolic heart failure (HCC) Active Problems:   Cough in adult   Uncontrolled hypertension   Diabetes mellitus type 2 in obese (HCC)   CKD (chronic kidney disease), stage V (HCC)   Metabolic acidosis   Acute on chronic diastolic CHF (congestive heart failure) ([place])   1. Acute on chronic diastolic CHF: Precipitated by progressive chronic kidney disease, poorly controlled hypertension and noncompliance. 2-D echo 12/31/17: LVEF 60-65 percent and grade 1 diastolic dysfunction. Cardiac cath 10/2016: Nonobstructive CAD. Initiated hemodialysis on 1/11 and volume management across HD by nephrology. Improving. 2. Hypertensive urgency: Complicated by noncompliance. Now started on labetalol 200 MG twice a day and amlodipine 10 MG daily. Volume management across HD. Improving. 3. Acute viral bronchitis: Exposed to an adult son with URI symptoms. Chest x-ray without pneumonia. Supportive treatment. RSV panel negative. Pro-calcitonin 0.10. 4. Stage V chronic kidney disease/anabolic acidosis: Her renal functions have been rapidly declining over  the past year. Creatinine was 1.84 in November 2017 and now presented with creatinine >7. Her nausea may be related to uremia or congestive gastropathy from CHF. Nephrology consultation and follow-up appreciated. Started HD via right IJ temporary HD catheter. Dialyzed 1/11, plans to dialyze again 1/13 and then 1/14. Discussed with Dr. Justin Mend. As per vascular surgery, AVF creation and change temp to [facility] 1/15. 5. Type II DM with renal complications: H4L: 7.4. Monitor CBGs and SSI. Reasonable inpatient control. 6. Anemia of chronic kidney disease: Follow CBCs. May consider Aranesp. Stable. 7. Medical noncompliance: Counseled. 8. Morbid obesity/Body mass index is 43.9 kg/m.   DVT prophylaxis: Heparin Code Status: Full Family Communication: None at bedside Disposition: DC home when medically improved, may take several days.  Consultants:  Cardiology Nephrology  IR  Procedures:  2-D echo Right IJ temporary HD catheter placed by IR 1/11.  Antimicrobials:  None    Subjective: Still has some dry cough and occasional wheezing. No chest pain reported.  ROS: No dizziness or lightheadedness.  Objective:  Vitals:   01/02/18 1230 01/02/18 1238 01/02/18 1300 01/02/18 1421  BP: (!) 160/82 (!) 170/78 (!) 178/80   Pulse: 80 80 78   Resp:  18 18   Temp:  98.6 F (37 C) 98.6 F (37 C)   TempSrc:  Oral Oral   SpO2:  95% 96% 94%  Weight:  116 kg (255 lb 11.7 oz)    Height:        Examination:  General exam: Pleasant young female, moderately built and morbidly obese, sitting up comfortably in bed this morning. Respiratory system: Improved breath sounds. Occasional posterior rhonchi. Few basal crackles. Rest of lung fields clear to auscultation. No increased work of breathing. Cardiovascular system: S1 &  S2 heard, RRR. No JVD, murmurs, rubs, gallops or clicks. 1+ pitting bilateral leg edema. Telemetry: Sinus rhythm. An episode of 5 bt NSVT on 01/02/18 at 1:43 AM. Gastrointestinal system:  Abdomen is nondistended, soft and nontender. No organomegaly or masses felt. Normal bowel sounds heard. Stable. Central nervous system: Alert and oriented. No focal neurological deficits. Stable Extremities: Symmetric 5 x 5 power. Stable. Skin: No rashes, lesions or ulcers Psychiatry: Judgement and insight appear normal. Mood & affect appropriate.     Data Reviewed: I have personally reviewed following labs and imaging studies  CBC: Recent Labs  Lab 12/30/17 1400 12/31/17 1920 01/01/18 0356 01/02/18 1050  WBC 14.8* 10.5 9.5 11.0*  NEUTROABS 11.2*  --   --   --   HGB 9.5* 8.4* 8.7* 8.8*  HCT 30.5* 27.0* 27.9* 28.2*  MCV 83.6 85.2 84.8 84.2  PLT 324 273 273 938   Basic Metabolic Panel: Recent Labs  Lab 12/30/17 1400 12/30/17 1703 12/31/17 0815 01/01/18 0356 01/02/18 1050  NA 140  --  138 139 137  K 3.9  --  3.6 3.8 3.3*  CL 111  --  110 108 104  CO2 18*  --  21* 22 21*  GLUCOSE 150*  --  163* 129* 201*  BUN 59*  --  55* 45* 50*  CREATININE 7.19*  --  7.43* 6.50* 7.12*  CALCIUM 7.9*  --  7.9* 8.0* 8.3*  MG  --  1.9  --   --   --   PHOS  --   --   --  5.0* 5.3*   Liver Function Tests: Recent Labs  Lab 12/31/17 1920 01/01/18 0356 01/02/18 1050  ALT 25  --   --   ALBUMIN  --  2.5* 2.5*   HbA1C: Recent Labs    12/30/17 1703  HGBA1C 7.4*   CBG: Recent Labs  Lab 01/01/18 0746 01/01/18 1115 01/01/18 1701 01/01/18 2131 01/02/18 0734  GLUCAP 157* 166* 175* 168* 119*    Recent Results (from the past 240 hour(s))  Respiratory Panel by PCR     Status: None   Collection Time: 12/30/17  4:22 PM  Result Value Ref Range Status   Adenovirus NOT DETECTED NOT DETECTED Final   Coronavirus 229E NOT DETECTED NOT DETECTED Final   Coronavirus HKU1 NOT DETECTED NOT DETECTED Final   Coronavirus NL63 NOT DETECTED NOT DETECTED Final   Coronavirus OC43 NOT DETECTED NOT DETECTED Final   Metapneumovirus NOT DETECTED NOT DETECTED Final   Rhinovirus / Enterovirus NOT  DETECTED NOT DETECTED Final   Influenza A NOT DETECTED NOT DETECTED Final   Influenza B NOT DETECTED NOT DETECTED Final   Parainfluenza Virus 1 NOT DETECTED NOT DETECTED Final   Parainfluenza Virus 2 NOT DETECTED NOT DETECTED Final   Parainfluenza Virus 3 NOT DETECTED NOT DETECTED Final   Parainfluenza Virus 4 NOT DETECTED NOT DETECTED Final   Respiratory Syncytial Virus NOT DETECTED NOT DETECTED Final   Bordetella pertussis NOT DETECTED NOT DETECTED Final   Chlamydophila pneumoniae NOT DETECTED NOT DETECTED Final   Mycoplasma pneumoniae NOT DETECTED NOT DETECTED Final         Radiology Studies: Ir Fluoro Guide Cv Line Right  Result Date: 12/31/2017 INDICATION: 57 year old female with end-stage renal disease in need of hemodialysis EXAM: IR RIGHT FLOURO GUIDE CV LINE; IR ULTRASOUND GUIDANCE VASC ACCESS RIGHT MEDICATIONS: None ANESTHESIA/SEDATION: None FLUOROSCOPY TIME:  Fluoroscopy Time: 0 minutes 6 seconds (2 mGy). COMPLICATIONS: None immediate. PROCEDURE: Informed written consent was  obtained from the patient after a thorough discussion of the procedural risks, benefits and alternatives. All questions were addressed. Maximal Sterile Barrier Technique was utilized including caps, mask, sterile gowns, sterile gloves, sterile drape, hand hygiene and skin antiseptic. A timeout was performed prior to the initiation of the procedure. The right internal jugular vein was interrogated with ultrasound and found to be widely patent. An image was obtained and stored for the medical record. Local anesthesia was attained by infiltration with 1% lidocaine. A small dermatotomy was made. Under real-time sonographic guidance, the vessel was punctured with a 21 gauge micropuncture needle. Using standard technique, the initial micro needle was exchanged over a 0.018 micro wire for a transitional 4 [place] micro sheath. The micro sheath was removed in the soft tissue tract dilated. A 20 [place] Trialysis catheter was  then advanced over the wire and the catheter tips were positioned in the mid right atrium under fluoroscopy. An image was obtained and stored. The catheter was flushed with heparinized saline and secured to the skin with 0 Prolene suture. A sterile bandage was applied. IMPRESSION: Successful placement of a right IJ approach 20 cm Trialysis catheter. The catheter tips are in the right atrium and ready for immediate use. Electronically Signed   By: Jacqulynn Cadet M.D.   On: 12/31/2017 17:00   Ir US Guide Vasc Access Right  Result Date: 12/31/2017 INDICATION: 57 year old female with end-stage renal disease in need of hemodialysis EXAM: IR RIGHT FLOURO GUIDE CV LINE; IR ULTRASOUND GUIDANCE VASC ACCESS RIGHT MEDICATIONS: None ANESTHESIA/SEDATION: None FLUOROSCOPY TIME:  Fluoroscopy Time: 0 minutes 6 seconds (2 mGy). COMPLICATIONS: None immediate. PROCEDURE: Informed written consent was obtained from the patient after a thorough discussion of the procedural risks, benefits and alternatives. All questions were addressed. Maximal Sterile Barrier Technique was utilized including caps, mask, sterile gowns, sterile gloves, sterile drape, hand hygiene and skin antiseptic. A timeout was performed prior to the initiation of the procedure. The right internal jugular vein was interrogated with ultrasound and found to be widely patent. An image was obtained and stored for the medical record. Local anesthesia was attained by infiltration with 1% lidocaine. A small dermatotomy was made. Under real-time sonographic guidance, the vessel was punctured with a 21 gauge micropuncture needle. Using standard technique, the initial micro needle was exchanged over a 0.018 micro wire for a transitional 4 [place] micro sheath. The micro sheath was removed in the soft tissue tract dilated. A 20 [place] Trialysis catheter was then advanced over the wire and the catheter tips were positioned in the mid right atrium under fluoroscopy. An image  was obtained and stored. The catheter was flushed with heparinized saline and secured to the skin with 0 Prolene suture. A sterile bandage was applied. IMPRESSION: Successful placement of a right IJ approach 20 cm Trialysis catheter. The catheter tips are in the right atrium and ready for immediate use. Electronically Signed   By: Jacqulynn Cadet M.D.   On: 12/31/2017 17:00        Scheduled Meds: . amLODipine  10 mg Oral Daily  . [START ON 01/03/2018] calcitRIOL  0.25 mcg Oral Once per day on Mon Wed Fri  . darbepoetin (ARANESP) injection - DIALYSIS  60 mcg Intravenous Q Sat-HD  . famotidine  20 mg Oral Daily  . heparin  5,000 Units Subcutaneous Q8H  . insulin aspart  0-9 Units Subcutaneous TID WC  . ipratropium-albuterol  3 mL Nebulization Q6H  . labetalol  200 mg Oral BID  .  sodium chloride flush  3 mL Intravenous Q12H   Continuous Infusions: . sodium chloride    . ferric gluconate (FERRLECIT/NULECIT) IV 125 mg (01/02/18 1154)     LOS: 2 days     Vernell Leep, MD, FACP, [facility]. [facility] Pager [phone]  If 7PM-7AM, please contact night-coverage [email or web address] Password [facility] 01/02/2018, 2:47 PM

## 2018-01-03 LAB — CBC
HCT: 27.5 % — ABNORMAL LOW (ref 36.0–46.0)
Hemoglobin: 8.8 g/dL — ABNORMAL LOW (ref 12.0–15.0)
MCH: 26.7 pg (ref 26.0–34.0)
MCHC: 32 g/dL (ref 30.0–36.0)
MCV: 83.6 fL (ref 78.0–100.0)
PLATELETS: 228 10*3/uL (ref 150–400)
RBC: 3.29 MIL/uL — AB (ref 3.87–5.11)
RDW: 15.8 % — AB (ref 11.5–15.5)
WBC: 13.2 10*3/uL — AB (ref 4.0–10.5)

## 2018-01-03 LAB — RENAL FUNCTION PANEL
ALBUMIN: 2.6 g/dL — AB (ref 3.5–5.0)
Anion gap: 11 (ref 5–15)
BUN: 43 mg/dL — AB (ref 6–20)
CALCIUM: 8.2 mg/dL — AB (ref 8.9–10.3)
CO2: 22 mmol/L (ref 22–32)
CREATININE: 6.02 mg/dL — AB (ref 0.44–1.00)
Chloride: 103 mmol/L (ref 101–111)
GFR, EST AFRICAN AMERICAN: 8 mL/min — AB (ref >60)
GFR, EST NON AFRICAN AMERICAN: 7 mL/min — AB (ref >60)
Glucose, Bld: 126 mg/dL — ABNORMAL HIGH (ref 65–99)
PHOSPHORUS: 4.1 mg/dL (ref 2.5–4.6)
Potassium: 3.6 mmol/L (ref 3.5–5.1)
SODIUM: 136 mmol/L (ref 135–145)

## 2018-01-03 LAB — GLUCOSE, CAPILLARY
GLUCOSE-CAPILLARY: 130 mg/dL — AB (ref 65–99)
Glucose-Capillary: 185 mg/dL — ABNORMAL HIGH (ref 65–99)

## 2018-01-03 MED ORDER — ONDANSETRON HCL 4 MG/2ML IJ SOLN
INTRAMUSCULAR | Status: AC
  Administered 2018-01-03: 4 mg via INTRAVENOUS
  Filled 2018-01-03: qty 2

## 2018-01-03 MED ORDER — ONDANSETRON HCL 4 MG/2ML IJ SOLN
4.0000 mg | Freq: Once | INTRAMUSCULAR | Status: AC
  Administered 2018-01-03: 4 mg via INTRAVENOUS

## 2018-01-03 MED ORDER — CEFAZOLIN SODIUM-DEXTROSE 2-4 GM/100ML-% IV SOLN
2.0000 g | INTRAVENOUS | Status: AC
Start: 2018-01-04 — End: 2018-01-04
  Administered 2018-01-04: 2 g via INTRAVENOUS
  Filled 2018-01-03: qty 100

## 2018-01-03 MED ORDER — PRO-STAT SUGAR FREE PO LIQD
30.0000 mL | Freq: Two times a day (BID) | ORAL | Status: DC
  Administered 2018-01-03 – 2018-01-04 (×3): 30 mL via ORAL
  Filled 2018-01-03 (×4): qty 30

## 2018-01-03 MED ORDER — DARBEPOETIN ALFA 60 MCG/0.3ML IJ SOSY: 60.0000 ug | PREFILLED_SYRINGE | INTRAMUSCULAR | Status: DC

## 2018-01-03 NOTE — Progress Notes (Signed)
  [facility] Progress Note   Assessment/ Plan:   1. 1. Acute on chronic dCHF exac/ HTNsive emergency: initially came with hypertensive emergency requiring nicardipine gtt and vol overload.  Now off gtt and getting vol removal with HD.  EF 30-16%, grade 1 diastolic dysfunction. Cardiac cath 10/2016: Nonobstructive CAD.  On norvasc and labetalol, expect pressures to improve with UF but may need more agents. 2.ESRD: initiated HD 12/31/2017.  HD #3 today.  Tolerating well.  Has a nontunneled HD cath in place, to have [facility] and perm access placement Tuesday with VVS. 3. Anemia: Hgb 8.8, ESA initiated yesterday. % sat 14, doing ferrlicit  4. 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 Nutrition:albumin 2.6, will start prostat and on nepro   Subjective:    Seen on HD.  Pt reports nausea.  BP s are elevated.   Objective:   BP (!) 200/99   Pulse 74   Temp 98.6 F (37 C) (Oral)   Resp 18   Ht 5\' 4"  (1.626 m)   Wt 115.8 kg (255 lb 4.7 oz)   LMP 02/02/2015   SpO2 96%   BMI 43.82 kg/m   Physical Exam: Gen: lying in bed, appears mildly nauseated CVS: RRR soft systolic murmur Resp: clear bilaterally Abd: soft, mildly diffusely tender Ext: 2+ LE edema ACCESS: R IJ nontunneled HD cath in place  Labs: BMET Recent Labs  Lab 12/30/17 1400 12/31/17 0815 01/01/18 0356 01/02/18 1050 01/03/18 0710  NA 140 138 139 137 136  K 3.9 3.6 3.8 3.3* 3.6  CL 111 110 108 104 103  CO2 18* 21* 22 21* 22  GLUCOSE 150* 163* 129* 201* 126*  BUN 59* 55* 45* 50* 43*  CREATININE 7.19* 7.43* 6.50* 7.12* 6.02*  CALCIUM 7.9* 7.9* 8.0* 8.3* 8.2*  PHOS  --   --  5.0* 5.3* 4.1   CBC Recent Labs  Lab 12/30/17 1400 12/31/17 1920 01/01/18 0356 01/02/18 1050 01/03/18 0715  WBC 14.8* 10.5 9.5 11.0* 13.2*  NEUTROABS 11.2*  --   --   --   --   HGB 9.5* 8.4* 8.7* 8.8* 8.8*  HCT 30.5* 27.0* 27.9* 28.2* 27.5*  MCV 83.6 85.2 84.8 84.2 83.6  PLT 324 273 273 282 228     @IMGRELPRIORS @ Medications:    . amLODipine  10 mg Oral Daily  . calcitRIOL  0.25 mcg Oral Once per day on Mon Wed Fri  . darbepoetin (ARANESP) injection - DIALYSIS  60 mcg Intravenous Q Sat-HD  . famotidine  20 mg Oral Daily  . heparin  5,000 Units Subcutaneous Q8H  . insulin aspart  0-9 Units Subcutaneous TID WC  . ipratropium-albuterol  3 mL Nebulization Q6H  . labetalol  200 mg Oral BID  . sodium chloride flush  3 mL Intravenous Q12H     Madelon Lips MD [facility] pgr [phone] 01/03/2018, 8:03 AM

## 2018-01-03 NOTE — Progress Notes (Signed)
PROGRESS NOTE   Pamela Campbell  RSW:[id number]    DOB: 26-Apr-1961    DOA: 12/30/2017  PCP: Dineen Kid, MD   I have briefly reviewed patients previous medical records in [facility].  Brief Narrative:  57 year old female with PMH of poorly controlled HTN, DM 2, chronic kidney disease, chronic diastolic CHF, medical noncompliance, obesity, recent hospital discharge 12/21 for hypertensive urgency requiring nicardipine drip, was sent to ED from PCP office due to dyspnea, wheezing, nausea. Initial blood pressure 284/150. She also reported subjective fevers, chills, nasal congestion and mostly nonproductive cough. Exposed to adult son with URI symptoms prior to her getting sick. Admitted for acute on chronic diastolic CHF, hypertensive urgency and acute bronchitis. Cardiology and nephrology consulting. Started HD. VVS plan TDC and AVF 1/15.   Assessment & Plan:   Principal Problem:   Acute on chronic diastolic heart failure (HCC) Active Problems:   Cough in adult   Uncontrolled hypertension   Diabetes mellitus type 2 in obese (HCC)   CKD (chronic kidney disease), stage V (HCC)   Metabolic acidosis   Acute on chronic diastolic CHF (congestive heart failure) ([place])   1. Acute on chronic diastolic CHF: Precipitated by progressive chronic kidney disease, poorly controlled hypertension and noncompliance. 2-D echo 12/31/17: LVEF 60-65 percent and grade 1 diastolic dysfunction. Cardiac cath 10/2016: Nonobstructive CAD. Initiated hemodialysis on 1/11 and volume management across HD by nephrology. Improving. 2. Hypertensive urgency: Complicated by noncompliance. Now started on labetalol 200 MG twice a day and amlodipine 10 MG daily. Volume management across HD. Improving. 3. Acute viral bronchitis: Exposed to an adult son with URI symptoms. Chest x-ray without pneumonia. Supportive treatment. RSV panel negative. Pro-calcitonin 0.10. Improving. 4. New ESRD/Metabolic acidosis: Her renal functions have  been rapidly declining over the past year. Creatinine was 1.84 in November 2017 and now presented with creatinine >7. Her nausea may be related to uremia or congestive gastropathy from CHF. Nephrology consultation and follow-up appreciated. Started HD via right IJ temporary HD catheter. Dialyzed 1/11, plans to dialyze again 1/13 and then 1/14. As per vascular surgery, AVF creation and change temp to [facility] 1/15. CLIP ongoing. 5. Type II DM with renal complications: K9F: 7.4. Monitor CBGs and SSI. Reasonable inpatient control. 6. Anemia of chronic kidney disease: Follow CBCs. May consider Aranesp. Stable. 7. Medical noncompliance: Counseled. 8. Morbid obesity/Body mass index is 43.29 kg/m.   DVT prophylaxis: Heparin Code Status: Full Family Communication: None at bedside Disposition: DC home when medically improved, hopefully later this week.  Consultants:  Cardiology Nephrology  IR  Procedures:  2-D echo Right IJ temporary HD catheter placed by IR 1/11.  Antimicrobials:  None    Subjective: Dyspnea, wheezing resolved. Still has intermittent dry cough. No chest pain. Mild nausea without vomiting while on HD.  ROS: No dizziness or lightheadedness.  Objective:  Vitals:   01/03/18 0900 01/03/18 0930 01/03/18 1000 01/03/18 1030  BP: (!) 171/90 (!) 173/98 (!) 167/83 (!) 169/84  Pulse: 78 77 78 81  Resp: (!) 21 18 (!) 23 20  Temp:    98.4 F (36.9 C)  TempSrc:    Oral  SpO2:    96%  Weight:    114.4 kg (252 lb 3.3 oz)  Height:        Examination:  General exam: Pleasant young female, moderately built and morbidly obese, lying comfortably supine in bed undergoing HD this morning. Respiratory system: Occasional basal crackles but otherwise clear to auscultation. No increased work of  breathing. Cardiovascular system: S1 & S2 heard, RRR. No JVD, murmurs, rubs, gallops or clicks. Trace bilateral ankle edema. Telemetry: Sinus rhythm. Stable without change. Gastrointestinal system:  Abdomen is nondistended, soft and nontender. No organomegaly or masses felt. Normal bowel sounds heard. Stable without change. Central nervous system: Alert and oriented. No focal neurological deficits. Stable Extremities: Symmetric 5 x 5 power. Stable. Skin: No rashes, lesions or ulcers Psychiatry: Judgement and insight appear normal. Mood & affect appropriate.     Data Reviewed: I have personally reviewed following labs and imaging studies  CBC: Recent Labs  Lab 12/30/17 1400 12/31/17 1920 01/01/18 0356 01/02/18 1050 01/03/18 0715  WBC 14.8* 10.5 9.5 11.0* 13.2*  NEUTROABS 11.2*  --   --   --   --   HGB 9.5* 8.4* 8.7* 8.8* 8.8*  HCT 30.5* 27.0* 27.9* 28.2* 27.5*  MCV 83.6 85.2 84.8 84.2 83.6  PLT 324 273 273 282 734   Basic Metabolic Panel: Recent Labs  Lab 12/30/17 1400 12/30/17 1703 12/31/17 0815 01/01/18 0356 01/02/18 1050 01/03/18 0710  NA 140  --  138 139 137 136  K 3.9  --  3.6 3.8 3.3* 3.6  CL 111  --  110 108 104 103  CO2 18*  --  21* 22 21* 22  GLUCOSE 150*  --  163* 129* 201* 126*  BUN 59*  --  55* 45* 50* 43*  CREATININE 7.19*  --  7.43* 6.50* 7.12* 6.02*  CALCIUM 7.9*  --  7.9* 8.0* 8.3* 8.2*  MG  --  1.9  --   --   --   --   PHOS  --   --   --  5.0* 5.3* 4.1   Liver Function Tests: Recent Labs  Lab 12/31/17 1920 01/01/18 0356 01/02/18 1050 01/03/18 0710  ALT 25  --   --   --   ALBUMIN  --  2.5* 2.5* 2.6*   HbA1C: No results for input(s): HGBA1C in the last 72 hours. CBG: Recent Labs  Lab 01/01/18 2131 01/02/18 0734 01/02/18 1626 01/02/18 2109 01/03/18 1142  GLUCAP 168* 119* 185* 210* 130*    Recent Results (from the past 240 hour(s))  Respiratory Panel by PCR     Status: None   Collection Time: 12/30/17  4:22 PM  Result Value Ref Range Status   Adenovirus NOT DETECTED NOT DETECTED Final   Coronavirus 229E NOT DETECTED NOT DETECTED Final   Coronavirus HKU1 NOT DETECTED NOT DETECTED Final   Coronavirus NL63 NOT DETECTED NOT  DETECTED Final   Coronavirus OC43 NOT DETECTED NOT DETECTED Final   Metapneumovirus NOT DETECTED NOT DETECTED Final   Rhinovirus / Enterovirus NOT DETECTED NOT DETECTED Final   Influenza A NOT DETECTED NOT DETECTED Final   Influenza B NOT DETECTED NOT DETECTED Final   Parainfluenza Virus 1 NOT DETECTED NOT DETECTED Final   Parainfluenza Virus 2 NOT DETECTED NOT DETECTED Final   Parainfluenza Virus 3 NOT DETECTED NOT DETECTED Final   Parainfluenza Virus 4 NOT DETECTED NOT DETECTED Final   Respiratory Syncytial Virus NOT DETECTED NOT DETECTED Final   Bordetella pertussis NOT DETECTED NOT DETECTED Final   Chlamydophila pneumoniae NOT DETECTED NOT DETECTED Final   Mycoplasma pneumoniae NOT DETECTED NOT DETECTED Final         Radiology Studies: No results found.      Scheduled Meds: . amLODipine  10 mg Oral Daily  . calcitRIOL  0.25 mcg Oral Once per day on Mon Wed Fri  . [  START ON 01/10/2018] darbepoetin (ARANESP) injection - DIALYSIS  60 mcg Intravenous Q Mon-HD  . famotidine  20 mg Oral Daily  . feeding supplement (PRO-STAT SUGAR FREE 64)  30 mL Oral BID  . heparin  5,000 Units Subcutaneous Q8H  . insulin aspart  0-9 Units Subcutaneous TID WC  . ipratropium-albuterol  3 mL Nebulization Q6H  . labetalol  200 mg Oral BID  . sodium chloride flush  3 mL Intravenous Q12H   Continuous Infusions: . sodium chloride    . [START ON 01/04/2018]  ceFAZolin (ANCEF) IV    . ferric gluconate (FERRLECIT/NULECIT) IV 125 mg (01/02/18 1154)     LOS: 3 days     Vernell Leep, MD, FACP, [facility]. [facility] Pager [phone]  If 7PM-7AM, please contact night-coverage [email or web address] Password [email or web address] 01/03/2018, 1:18 PM

## 2018-01-03 NOTE — Progress Notes (Signed)
Pt went to dialysis per transport. CCMD made aware.

## 2018-01-03 NOTE — Progress Notes (Signed)
   She is scheduled for right arm avf and conversion to tunneled catheter tomorrow in OR. NPO past midnight. Attempted to discuss with patient but she was sleeping and did not want to wake up to talk.   Brandon C. Donzetta Matters, MD [facility] Office: [phone] Pager: [phone]

## 2018-01-03 NOTE — Procedures (Signed)
Patient seen and examined on Hemodialysis. QB 300 mL/ min via nontunneled R IJ HD catheter, UF goal 2.5L Treatment adjusted as needed.  Madelon Lips MD [facility] pgr [phone] 8:11 AM

## 2018-01-03 NOTE — Anesthesia Preprocedure Evaluation (Addendum)
Anesthesia Evaluation  Patient identified by MRN, date of birth, ID band Patient awake    Reviewed: Allergy & Precautions, H&P , NPO status , Patient's Chart, lab work & pertinent test results  Airway Mallampati: II  TM Distance: >3 FB Neck ROM: Full    Dental no notable dental hx. (+) Teeth Intact, Dental Advisory Given   Pulmonary neg pulmonary ROS, former smoker,    Pulmonary exam normal breath sounds clear to auscultation       Cardiovascular Exercise Tolerance: Good hypertension, Pt. on medications and Pt. on home beta blockers +CHF   Rhythm:Regular Rate:Normal     Neuro/Psych negative neurological ROS  negative psych ROS   GI/Hepatic negative GI ROS, Neg liver ROS,   Endo/Other  diabetesMorbid obesity  Renal/GU CRFRenal disease  negative genitourinary   Musculoskeletal   Abdominal   Peds  Hematology negative hematology ROS (+)   Anesthesia Other Findings   Reproductive/Obstetrics negative OB ROS                            Anesthesia Physical Anesthesia Plan  ASA: III  Anesthesia Plan: MAC   Post-op Pain Management:    Induction: Intravenous  PONV Risk Score and Plan: 3 and Ondansetron, Propofol infusion and Midazolam  Airway Management Planned: Simple Face Mask  Additional Equipment:   Intra-op Plan:   Post-operative Plan:   Informed Consent: I have reviewed the patients History and Physical, chart, labs and discussed the procedure including the risks, benefits and alternatives for the proposed anesthesia with the patient or authorized representative who has indicated his/her understanding and acceptance.   Dental advisory given  Plan Discussed with: CRNA  Anesthesia Plan Comments:         Anesthesia Quick Evaluation

## 2018-01-04 ENCOUNTER — Inpatient Hospital Stay (HOSPITAL_COMMUNITY): Payer: Managed Care, Other (non HMO)

## 2018-01-04 ENCOUNTER — Telehealth: Payer: Self-pay | Admitting: Vascular Surgery

## 2018-01-04 ENCOUNTER — Encounter (HOSPITAL_COMMUNITY)
Admission: EM | Disposition: A | Discharge status: Home / Self Care | Payer: Self-pay | Source: Home / Self Care | Attending: Internal Medicine

## 2018-01-04 ENCOUNTER — Inpatient Hospital Stay (HOSPITAL_COMMUNITY): Payer: Managed Care, Other (non HMO) | Admitting: Anesthesiology

## 2018-01-04 HISTORY — PX: AV FISTULA PLACEMENT: SHX1204

## 2018-01-04 HISTORY — PX: INSERTION OF DIALYSIS CATHETER: SHX1324

## 2018-01-04 LAB — GLUCOSE, CAPILLARY
GLUCOSE-CAPILLARY: 117 mg/dL — AB (ref 65–99)
GLUCOSE-CAPILLARY: 118 mg/dL — AB (ref 65–99)
Glucose-Capillary: 133 mg/dL — ABNORMAL HIGH (ref 65–99)
Glucose-Capillary: 132 mg/dL — ABNORMAL HIGH (ref 65–99)
Glucose-Capillary: 159 mg/dL — ABNORMAL HIGH (ref 65–99)

## 2018-01-04 LAB — SURGICAL PCR SCREEN
MRSA, PCR: NEGATIVE
STAPHYLOCOCCUS AUREUS: NEGATIVE

## 2018-01-04 LAB — CBC
HCT: 30.3 % — ABNORMAL LOW (ref 36.0–46.0)
HEMOGLOBIN: 9.2 g/dL — AB (ref 12.0–15.0)
MCH: 26.2 pg (ref 26.0–34.0)
MCHC: 30.4 g/dL (ref 30.0–36.0)
MCV: 86.3 fL (ref 78.0–100.0)
Platelets: 220 10*3/uL (ref 150–400)
RBC: 3.51 MIL/uL — AB (ref 3.87–5.11)
RDW: 16.2 % — ABNORMAL HIGH (ref 11.5–15.5)
WBC: 13.3 10*3/uL — ABNORMAL HIGH (ref 4.0–10.5)

## 2018-01-04 LAB — BASIC METABOLIC PANEL
ANION GAP: 12 (ref 5–15)
BUN: 41 mg/dL — ABNORMAL HIGH (ref 6–20)
CALCIUM: 8.5 mg/dL — AB (ref 8.9–10.3)
CO2: 22 mmol/L (ref 22–32)
Chloride: 103 mmol/L (ref 101–111)
Creatinine, Ser: 5.78 mg/dL — ABNORMAL HIGH (ref 0.44–1.00)
GFR, EST AFRICAN AMERICAN: 9 mL/min — AB (ref >60)
GFR, EST NON AFRICAN AMERICAN: 7 mL/min — AB (ref >60)
Glucose, Bld: 125 mg/dL — ABNORMAL HIGH (ref 65–99)
Potassium: 4.2 mmol/L (ref 3.5–5.1)
Sodium: 137 mmol/L (ref 135–145)

## 2018-01-04 LAB — PROTIME-INR
INR: 1.07
PROTHROMBIN TIME: 13.8 s (ref 11.4–15.2)

## 2018-01-04 SURGERY — ARTERIOVENOUS (AV) FISTULA CREATION
Anesthesia: Monitor Anesthesia Care | Site: Neck | Laterality: Right

## 2018-01-04 MED ORDER — FENTANYL CITRATE (PF) 250 MCG/5ML IJ SOLN
INTRAMUSCULAR | Status: AC
  Filled 2018-01-04: qty 5

## 2018-01-04 MED ORDER — FENTANYL CITRATE (PF) 250 MCG/5ML IJ SOLN
INTRAMUSCULAR | Status: DC | PRN
  Administered 2018-01-04 (×3): 50 ug via INTRAVENOUS

## 2018-01-04 MED ORDER — HEPARIN SODIUM (PORCINE) 1000 UNIT/ML IJ SOLN
INTRAMUSCULAR | Status: DC | PRN
  Administered 2018-01-04: 3400 [IU]

## 2018-01-04 MED ORDER — GLIPIZIDE 2.5 MG HALF TABLET: 2.5000 mg | ORAL_TABLET | Freq: Every day | ORAL | Status: DC

## 2018-01-04 MED ORDER — PROMETHAZINE HCL 25 MG/ML IJ SOLN: 12.5000 mg | Freq: Four times a day (QID) | INTRAMUSCULAR | Status: DC | PRN

## 2018-01-04 MED ORDER — ONDANSETRON HCL 4 MG/2ML IJ SOLN
INTRAMUSCULAR | Status: DC | PRN
  Administered 2018-01-04: 4 mg via INTRAVENOUS

## 2018-01-04 MED ORDER — PROPOFOL 10 MG/ML IV BOLUS
INTRAVENOUS | Status: DC | PRN
  Administered 2018-01-04: 20 mg via INTRAVENOUS
  Administered 2018-01-04: 10 mg via INTRAVENOUS
  Administered 2018-01-04: 20 mg via INTRAVENOUS

## 2018-01-04 MED ORDER — OXYCODONE-ACETAMINOPHEN 5-325 MG PO TABS
ORAL_TABLET | ORAL | Status: AC
  Filled 2018-01-04: qty 1

## 2018-01-04 MED ORDER — LIDOCAINE 2% (20 MG/ML) 5 ML SYRINGE
INTRAMUSCULAR | Status: DC | PRN
  Administered 2018-01-04: 100 mg via INTRAVENOUS

## 2018-01-04 MED ORDER — 0.9 % SODIUM CHLORIDE (POUR BTL) OPTIME
TOPICAL | Status: DC | PRN
  Administered 2018-01-04: 1000 mL

## 2018-01-04 MED ORDER — MIDAZOLAM HCL 2 MG/2ML IJ SOLN
INTRAMUSCULAR | Status: AC
  Filled 2018-01-04: qty 2

## 2018-01-04 MED ORDER — PROPOFOL 500 MG/50ML IV EMUL
INTRAVENOUS | Status: DC | PRN
  Administered 2018-01-04: 50 ug/kg/min via INTRAVENOUS

## 2018-01-04 MED ORDER — HEPARIN SODIUM (PORCINE) 1000 UNIT/ML IJ SOLN
INTRAMUSCULAR | Status: AC
  Filled 2018-01-04: qty 1

## 2018-01-04 MED ORDER — LIDOCAINE HCL (PF) 1 % IJ SOLN
INTRAMUSCULAR | Status: AC
  Filled 2018-01-04: qty 30

## 2018-01-04 MED ORDER — LIDOCAINE-EPINEPHRINE (PF) 1 %-1:200000 IJ SOLN
INTRAMUSCULAR | Status: AC
  Filled 2018-01-04: qty 30

## 2018-01-04 MED ORDER — SODIUM CHLORIDE 0.9 % IV SOLN
INTRAVENOUS | Status: DC | PRN
  Administered 2018-01-04: 08:00:00 500 mL

## 2018-01-04 MED ORDER — FENTANYL CITRATE (PF) 100 MCG/2ML IJ SOLN
25.0000 ug | INTRAMUSCULAR | Status: DC | PRN
  Administered 2018-01-04: 50 ug via INTRAVENOUS

## 2018-01-04 MED ORDER — LIDOCAINE HCL (PF) 1 % IJ SOLN
INTRAMUSCULAR | Status: DC | PRN
  Administered 2018-01-04: 22.5 mL

## 2018-01-04 MED ORDER — PROMETHAZINE HCL 25 MG PO TABS: 12.5000 mg | ORAL_TABLET | Freq: Four times a day (QID) | ORAL | Status: DC | PRN

## 2018-01-04 MED ORDER — PROPOFOL 10 MG/ML IV BOLUS
INTRAVENOUS | Status: AC
  Filled 2018-01-04: qty 20

## 2018-01-04 MED ORDER — FENTANYL CITRATE (PF) 100 MCG/2ML IJ SOLN
INTRAMUSCULAR | Status: AC
  Filled 2018-01-04: qty 2

## 2018-01-04 MED ORDER — IPRATROPIUM-ALBUTEROL 0.5-2.5 (3) MG/3ML IN SOLN
3.0000 mL | Freq: Three times a day (TID) | RESPIRATORY_TRACT | Status: DC
  Administered 2018-01-04 – 2018-01-05 (×2): 3 mL via RESPIRATORY_TRACT
  Filled 2018-01-04 (×3): qty 3

## 2018-01-04 MED ORDER — PROPOFOL 1000 MG/100ML IV EMUL
INTRAVENOUS | Status: AC
  Filled 2018-01-04: qty 100

## 2018-01-04 MED ORDER — PROMETHAZINE HCL 25 MG RE SUPP: 25.0000 mg | Freq: Four times a day (QID) | RECTAL | Status: DC | PRN

## 2018-01-04 MED ORDER — ONDANSETRON HCL 4 MG/2ML IJ SOLN
4.0000 mg | Freq: Four times a day (QID) | INTRAMUSCULAR | Status: DC
  Administered 2018-01-04: 4 mg via INTRAVENOUS
  Filled 2018-01-04: qty 2

## 2018-01-04 MED ORDER — OXYCODONE-ACETAMINOPHEN 5-325 MG PO TABS
1.0000 | ORAL_TABLET | ORAL | Status: DC | PRN
  Administered 2018-01-04 (×2): 1 via ORAL
  Filled 2018-01-04: qty 1

## 2018-01-04 MED ORDER — DEXTROMETHORPHAN POLISTIREX ER 30 MG/5ML PO SUER
15.0000 mg | Freq: Two times a day (BID) | ORAL | Status: DC
  Administered 2018-01-04 – 2018-01-05 (×3): 15 mg via ORAL
  Filled 2018-01-04 (×6): qty 5

## 2018-01-04 MED ORDER — MIDAZOLAM HCL 2 MG/2ML IJ SOLN
INTRAMUSCULAR | Status: DC | PRN
  Administered 2018-01-04: 2 mg via INTRAVENOUS

## 2018-01-04 SURGICAL SUPPLY — 65 items
ARMBAND PINK RESTRICT EXTREMIT (MISCELLANEOUS) ×6 IMPLANT
BAG DECANTER FOR FLEXI CONT (MISCELLANEOUS) ×3 IMPLANT
BIOPATCH RED 1 DISK 7.0 (GAUZE/BANDAGES/DRESSINGS) ×3 IMPLANT
CANISTER SUCT 3000ML PPV (MISCELLANEOUS) ×3 IMPLANT
CATH PALINDROME RT-P 15FX19CM (CATHETERS) IMPLANT
CATH PALINDROME RT-P 15FX23CM (CATHETERS) ×3 IMPLANT
CATH PALINDROME RT-P 15FX28CM (CATHETERS) IMPLANT
CATH PALINDROME RT-P 15FX55CM (CATHETERS) IMPLANT
CATH STRAIGHT 5FR 65CM (CATHETERS) IMPLANT
CLIP VESOCCLUDE MED 6/CT (CLIP) ×3 IMPLANT
CLIP VESOCCLUDE SM WIDE 6/CT (CLIP) ×3 IMPLANT
COVER PROBE W GEL 5X96 (DRAPES) ×3 IMPLANT
COVER SURGICAL LIGHT HANDLE (MISCELLANEOUS) ×3 IMPLANT
DECANTER SPIKE VIAL GLASS SM (MISCELLANEOUS) ×3 IMPLANT
DERMABOND ADVANCED (GAUZE/BANDAGES/DRESSINGS) ×1
DERMABOND ADVANCED .7 DNX12 (GAUZE/BANDAGES/DRESSINGS) ×2 IMPLANT
DRAIN PENROSE 3/4X12 (DRAIN) ×3 IMPLANT
DRAPE C-ARM 42X72 X-RAY (DRAPES) ×3 IMPLANT
DRAPE CHEST BREAST 15X10 FENES (DRAPES) ×3 IMPLANT
ELECT REM PT RETURN 9FT ADLT (ELECTROSURGICAL) ×3
ELECTRODE REM PT RTRN 9FT ADLT (ELECTROSURGICAL) ×2 IMPLANT
GAUZE SPONGE 4X4 12PLY STRL (GAUZE/BANDAGES/DRESSINGS) ×3 IMPLANT
GAUZE SPONGE 4X4 16PLY XRAY LF (GAUZE/BANDAGES/DRESSINGS) ×6 IMPLANT
GLOVE BIO SURGEON STRL SZ 6.5 (GLOVE) ×3 IMPLANT
GLOVE BIO SURGEON STRL SZ7 (GLOVE) ×6 IMPLANT
GLOVE BIOGEL PI IND STRL 6.5 (GLOVE) ×8 IMPLANT
GLOVE BIOGEL PI IND STRL 7.0 (GLOVE) ×2 IMPLANT
GLOVE BIOGEL PI IND STRL 7.5 (GLOVE) ×4 IMPLANT
GLOVE BIOGEL PI INDICATOR 6.5 (GLOVE) ×4
GLOVE BIOGEL PI INDICATOR 7.0 (GLOVE) ×1
GLOVE BIOGEL PI INDICATOR 7.5 (GLOVE) ×2
GLOVE SURG SS PI 7.0 STRL IVOR (GLOVE) ×3 IMPLANT
GOWN STRL REUS W/ TWL LRG LVL3 (GOWN DISPOSABLE) ×6 IMPLANT
GOWN STRL REUS W/ TWL XL LVL3 (GOWN DISPOSABLE) ×2 IMPLANT
GOWN STRL REUS W/TWL LRG LVL3 (GOWN DISPOSABLE) ×3
GOWN STRL REUS W/TWL XL LVL3 (GOWN DISPOSABLE) ×1
HEMOSTAT SPONGE AVITENE ULTRA (HEMOSTASIS) IMPLANT
KIT BASIN OR (CUSTOM PROCEDURE TRAY) ×3 IMPLANT
KIT ROOM TURNOVER OR (KITS) ×3 IMPLANT
NEEDLE 18GX1X1/2 (RX/OR ONLY) (NEEDLE) ×3 IMPLANT
NEEDLE HYPO 25GX1X1/2 BEV (NEEDLE) ×3 IMPLANT
NS IRRIG 1000ML POUR BTL (IV SOLUTION) ×3 IMPLANT
PACK CV ACCESS (CUSTOM PROCEDURE TRAY) ×3 IMPLANT
PACK SURGICAL SETUP 50X90 (CUSTOM PROCEDURE TRAY) ×3 IMPLANT
PAD ARMBOARD 7.5X6 YLW CONV (MISCELLANEOUS) ×6 IMPLANT
SET MICROPUNCTURE 5F STIFF (MISCELLANEOUS) IMPLANT
SLEEVE SURGEON STRL (DRAPES) ×3 IMPLANT
SOAP 2 % CHG 4 OZ (WOUND CARE) ×3 IMPLANT
SUT ETHILON 3 0 PS 1 (SUTURE) ×3 IMPLANT
SUT MNCRL AB 4-0 PS2 18 (SUTURE) ×6 IMPLANT
SUT PROLENE 6 0 BV (SUTURE) IMPLANT
SUT PROLENE 7 0 BV 1 (SUTURE) ×6 IMPLANT
SUT VIC AB 3-0 SH 27 (SUTURE) ×2
SUT VIC AB 3-0 SH 27X BRD (SUTURE) ×4 IMPLANT
SYR 10ML LL (SYRINGE) ×3 IMPLANT
SYR 20CC LL (SYRINGE) ×6 IMPLANT
SYR 3ML LL SCALE MARK (SYRINGE) ×3 IMPLANT
SYR 5ML LL (SYRINGE) ×3 IMPLANT
SYR CONTROL 10ML LL (SYRINGE) ×3 IMPLANT
TOWEL GREEN STERILE (TOWEL DISPOSABLE) ×3 IMPLANT
TOWEL GREEN STERILE FF (TOWEL DISPOSABLE) ×3 IMPLANT
UNDERPAD 30X30 (UNDERPADS AND DIAPERS) ×3 IMPLANT
WATER STERILE IRR 1000ML POUR (IV SOLUTION) ×3 IMPLANT
WIRE AMPLATZ SS-J .035X180CM (WIRE) IMPLANT
WIRE BENTSON .035X145CM (WIRE) ×3 IMPLANT

## 2018-01-04 NOTE — Op Note (Signed)
OPERATIVE NOTE  PROCEDURE: 1. Conversion of right internal jugular vein temporary dialysis catheter to tunneled dialysis catheter  2.  Right radiocephalic arteriovenous fistula    PRE-OPERATIVE DIAGNOSIS: hemodialysis dependence  POST-OPERATIVE DIAGNOSIS: same as above  SURGEON: Adele Barthel, MD  ANESTHESIA: local and MAC  ESTIMATED BLOOD LOSS: 30 cc  FINDING(S): 1.  Tips of the catheter in the right atrium on fluoroscopy 2.  No obvious pneumothorax on fluoroscopy  SPECIMEN(S):  none  INDICATIONS:   Pamela Campbell is a 57 y.o. female who presents with hemodialysis dependence.  The patient presents for conversion of temporary dialysis catheter to tunneled dialysis catheter placement and right arm access placement.  The patient is aware the risks of dialysis catheter placement include but are not limited to: bleeding, infection, central venous injury, pneumothorax, possible venous stenosis, possible malpositioning in the venous system, and possible infections related to long-term catheter presence.  Risk, benefits, and alternatives to access surgery were discussed.  The patient is aware the risks include but are not limited to: bleeding, infection, steal syndrome, nerve damage, ischemic monomelic neuropathy, thrombosis, failure to mature, complications related to venous hypertension, need for additional procedures, death and stroke.  The patient agrees to proceed forward with the procedure.   DESCRIPTION: After written full informed consent was obtained from the patient, the patient was taken back to the operating room.  Prior to induction, the patient was given IV antibiotics.  After obtaining adequate sedation, the patient was prepped and draped in the standard fashion for a chest or neck tunneled dialysis catheter placement.    I placed a J-wire into one port of the prior temporary dialysis catheter and advanced it into the inferior vena cava under fluroscopic guidance.  I removed  the previous temporary dialysis catheter over the wire.  I then immediately dilated the skin tract and venotomy with the medium dilator.  This was then exchanged for the dilator-sheath which was advanced to its entire length.  The wire and dilator was removed and then the 23 cm Palindrome catheter placed through the sheath under fluoroscopic guidance into the right atrium.  The peel away sheath was split and peeled away while hold the hub of the catheter in place.  I anesthesized the cannulation site, the catheter exit site, and tract for the subcutaneous tunnel with a total of 15 cc of 1% lidocaine without epinephrine.  I then made extended further the neck incision with a 11-blade and then placed an exit incision on the chest.  I dissected from the exit site to the cannulation site with a metal tunneler.   I clamped the catheter with plastic clamp and then transected the back end of this catheter.  This was loaded onto the tunneler.  I delivered the back end of the catheter docked onto the tunneler through the subcutaneous tunnel.  The back end of the catheter was clamped and transected a second time, revealing the two lumens of this catheter.  The hub of the catheter was loaded over the catheter and then the ports were docked onto these two lumens.  The catheter hub was then clicked into place.  Each port was tested by aspirating and flushing.  No resistance was noted.    Each port was then thoroughly flushed with heparinized saline.  The catheter was secured in placed with two interrupted stitches of 3-0 Nylon tied to the catheter.  The neck incision was closed with a U-stitch of 4-0 Monocryl.  The neck and  chest incision were cleaned and sterile bandages applied.  Each port was then loaded with concentrated heparin (1000 Units/mL) at the manufacturer recommended volumes to each port.  Sterile caps were applied to each port.    On completion fluoroscopy, the tips of the catheter were in the right atrium,  and there was no evidence of pneumothorax.  The drapes were taken off and then the patient repositioned for a right arm access procedure.  She was reprepped and redraped.   I turned my attention first to identifying the patient's distal cephalic vein and radial artery.  Using SonoSite guidance, the location of these vessels were marked out on the skin.   At this point, I injected local anesthetic to obtain a field block of the wrist.  In total, I injected about 10 mL of 1% lidocaine without epinephrine.  I made a longitudinal incision at the level of the wrist and dissected through the subcutaneous tissue and fascia to gain exposure of the radial artery.  This was noted to be 2.5 mm in diameter externally.  This was dissected out proximally and distally and controlled with vessel loops.  I then made a longitudinal incision oer the cephalic vein and dissected it out with electrocautery.  There was some evidence of inflammatory changes around the vein.  This vein was noted to be 2.5 mm in diameter externally.  The distal segment of the vein was ligated with a  2-0 silk, and the vein was transected.  The proximal segment was interrogated with serial dilators.  The vein accepted up to a 3 mm dilator without any difficulty.  I then instilled the heparinized saline into the vein and clamped it.  I dissected a tunnel between the radial artery exposure and cephalic vein exposure in the subcutaneous tissue.  I marked the anterior orientation of the vein and delivered the vein through the tunnel.    At this point, I reset my exposure of the radial artery and placed the artery under tension proximally and distally.  I made an arteriotomy with a #11 blade, and then I extended the arteriotomy with a Potts scissor.  I injected heparinized saline proximal and distal to this arteriotomy.  The vein was then sewn to the artery in an end-to-side configuration with a running stitch of 7-0 Prolene.  Prior to completing this  anastomosis, I allowed the vein and artery to backbleed.  There was no evidence of clot from any vessels.  I completed the anastomosis in the usual fashion and then released all vessel loops and clamps.    There was a dopplerable flow in the venous outflow, and there was a dopplerable radial signal.  The artery looked to be spasming from the clamping, so I was not surprised with the lack of thrill and radial pulse.    At this point, I irrigated out the surgical wound.  There was no further active bleeding.  The subcutaneous tissue in each incisions was reapproximated with a running stitch of 3-0 Vicryl.  The skin in each incisions was then reapproximated with a running subcuticular stitch of 4-0 Vicryl.  The skin in each incision was then cleaned, dried, and reinforced with Dermabond.  The patient tolerated this procedure well.    COMPLICATIONS: none  CONDITION: stable   Adele Barthel, MD, [facility] [place] Office: [phone] Pager: [phone]  01/04/2018, 8:28 AM

## 2018-01-04 NOTE — Progress Notes (Signed)
PROGRESS NOTE   Pamela Campbell  WNU:[id number]    DOB: 1961/04/23    DOA: 12/30/2017  PCP: Dineen Kid, MD   I have briefly reviewed patients previous medical records in [facility].  Brief Narrative:  49 F poorly controlled HTN, DM 2, chronic kidney disease, chronic diastolic CHF, medical noncompliance, obesity, recent hospital discharge 12/21 for hypertensive urgency requiring nicardipine drip, was sent to ED from PCP office due to dyspnea, wheezing, nausea. Initial blood pressure 284/150. She also reported subjective fevers, chills, nasal congestion and mostly nonproductive cough. Exposed to adult son with URI symptoms prior to her getting sick. Admitted for acute on chronic diastolic CHF, hypertensive urgency and acute bronchitis. Cardiology and nephrology consulting. Started HD. VVS plan TDC and AVF 1/15.   Assessment & Plan:   Principal Problem:   Acute on chronic diastolic heart failure (HCC) Active Problems:   Cough in adult   Uncontrolled hypertension   Diabetes mellitus type 2 in obese (HCC)   CKD (chronic kidney disease), stage V (HCC)   Metabolic acidosis   Acute on chronic diastolic CHF (congestive heart failure) (HCC)   Acute on chronic diastolic CHF:  progressive chronic kidney disease, poorly controlled hypertension and noncompliance. 2-D echo 12/31/17: LVEF 60-65 percent and grade 1 diastolic dysfunction. Cardiac cath 10/2016: Nonobstructive CAD. Initiated hemodialysis on 1/11 Cough is from fluid-added Delsym and reassured patient  Hypertensive urgency: on labetalol 200 MG twice a day and amlodipine 10 MG daily. -mopd controlled-expect will be better controlled postHG after more attempts  Acute viral bronchitis: Exposed to an adult son with URI symptoms. Chest x-ray without pneumonia. Supportive treatment. RSV panel negative. Pro-calcitonin 0.10. Improved and no further work-up  New ESRD/Metabolic acidosis 2/2 HTN urgency in setting of DM Glomerulonephropathy: Her  renal functions have been rapidly declining over the past year. Creatinine was 1.84 in November 2017 and now presented with creatinine >7. ? nausea -- uremia or congestive gastropathy from CHF-Zofran ordered. Nephrology consultation and follow-up appreciated. Started HD via right IJ temporary HD catheter. Dialysis plans on-going-close to clipping?-check phos and iron stores as per renal  Type II DM with renal complications: I3K: 7.4. Monitor CBGs and SSI. Not on meds prior and refuses to use meds as sugars have been "controlled" in 100-140 range  Anemia of chronic kidney disease: Follow CBCs. May consider Aranesp. Stable. Iron 33, sat 14  Morbid obesity/Body mass index is 43.34 kg/m.   DVT prophylaxis: Heparin Code Status: Full Family Communication: d/w 2 daughters at bedside Disposition: DC home when medically improved, hopefully later this week.  Consultants:  Cardiology Nephrology  IR  Procedures:  2-D echo Right IJ temporary HD catheter placed by IR 1/11.  Antimicrobials:  None    Subjective:  Awake alert eating but 2 episodes vomit-still has cough and wheeze.  No cp No cp No sob No pain currently at sites of surgery Complains about dialysis "not going well"-tells me had low blood pressures and then had "thick blood"--encouraged her to d/w Nephrology some concerns-reassured about volume and attempts to decrease and explained nothing is ever perfect the first time with dialysis and it is an on-going process to help with volume management  Objective:  Vitals:   01/04/18 1030 01/04/18 1045 01/04/18 1053 01/04/18 1140  BP: 135/71 (!) 156/79 (!) 148/76 (!) 163/69  Pulse: 72 73 70 72  Resp: 19 18 (!) 21 18  Temp:   98 F (36.7 C) (!) 97.4 F (36.3 C)  TempSrc:  Oral  SpO2: 99% 95% 96% 98%  Weight:      Height:        Examination:  Alert obese flat affect No ict no pallor no jvd cta b abd soft nt nd no rebound Neuro intact Dressing on chest clean-fisutula  sites on RUE stable   Data Reviewed: I have personally reviewed following labs and imaging studies  CBC: Recent Labs  Lab 12/30/17 1400 12/31/17 1920 01/01/18 0356 01/02/18 1050 01/03/18 0715 01/04/18 0617  WBC 14.8* 10.5 9.5 11.0* 13.2* 13.3*  NEUTROABS 11.2*  --   --   --   --   --   HGB 9.5* 8.4* 8.7* 8.8* 8.8* 9.2*  HCT 30.5* 27.0* 27.9* 28.2* 27.5* 30.3*  MCV 83.6 85.2 84.8 84.2 83.6 86.3  PLT 324 273 273 282 228 314   Basic Metabolic Panel: Recent Labs  Lab 12/30/17 1703 12/31/17 0815 01/01/18 0356 01/02/18 1050 01/03/18 0710 01/04/18 0617  NA  --  138 139 137 136 137  K  --  3.6 3.8 3.3* 3.6 4.2  CL  --  110 108 104 103 103  CO2  --  21* 22 21* 22 22  GLUCOSE  --  163* 129* 201* 126* 125*  BUN  --  55* 45* 50* 43* 41*  CREATININE  --  7.43* 6.50* 7.12* 6.02* 5.78*  CALCIUM  --  7.9* 8.0* 8.3* 8.2* 8.5*  MG 1.9  --   --   --   --   --   PHOS  --   --  5.0* 5.3* 4.1  --    Liver Function Tests: Recent Labs  Lab 12/31/17 1920 01/01/18 0356 01/02/18 1050 01/03/18 0710  ALT 25  --   --   --   ALBUMIN  --  2.5* 2.5* 2.6*   HbA1C: No results for input(s): HGBA1C in the last 72 hours. CBG: Recent Labs  Lab 01/02/18 2109 01/03/18 1142 01/03/18 1638 01/04/18 0515 01/04/18 1014  GLUCAP 210* 130* 185* 133* 117*    Radiology Studies: Dg Chest Port 1 View  Result Date: 01/04/2018 CLINICAL DATA:  Postop dialysis catheter insertion EXAM: PORTABLE CHEST 1 VIEW COMPARISON:  12/30/2017 FINDINGS: There is interval placement of a right IJ dialysis catheter with the tip projecting over the right atrium. There is mild bilateral interstitial prominence. There is no pleural effusion or pneumothorax. There is stable cardiomegaly. The osseous structures are unremarkable. IMPRESSION: Interval placement of a right IJ dialysis catheter with the tip projecting over the right atrium. Mild interstitial edema. Electronically Signed   By: Kathreen Devoid   On: 01/04/2018 10:43    Dg Fluoro Guide Cv Line-no Report  Result Date: 01/04/2018 Fluoroscopy was utilized by the requesting physician.  No radiographic interpretation.    Scheduled Meds: . amLODipine  10 mg Oral Daily  . calcitRIOL  0.25 mcg Oral Once per day on Mon Wed Fri  . [START ON 01/10/2018] darbepoetin (ARANESP) injection - DIALYSIS  60 mcg Intravenous Q Mon-HD  . dextromethorphan  15 mg Oral BID  . famotidine  20 mg Oral Daily  . feeding supplement (PRO-STAT SUGAR FREE 64)  30 mL Oral BID  . heparin  5,000 Units Subcutaneous Q8H  . insulin aspart  0-9 Units Subcutaneous TID WC  . ipratropium-albuterol  3 mL Nebulization Q6H  . labetalol  200 mg Oral BID  . ondansetron (ZOFRAN) IV  4 mg Intravenous Q6H  . sodium chloride flush  3 mL Intravenous Q12H  Continuous Infusions: . sodium chloride    . ferric gluconate (FERRLECIT/NULECIT) IV 125 mg (01/02/18 1154)     LOS: 4 days     Verneita Griffes, MD [facility]   If 7PM-7AM, please contact night-coverage [email or web address] Password [facility] 01/04/2018, 12:31 PM

## 2018-01-04 NOTE — Transfer of Care (Signed)
Immediate Anesthesia Transfer of Care Note  Patient: Pamela Campbell  Procedure(s) Performed: ARTERIOVENOUS (AV) FISTULA CREATION RIGHT ARM (Right ) INSERTION OF DIALYSIS CATHETER RIGHT INTERNAL JUGULAR (Right Neck)  Patient Location: PACU  Anesthesia Type:MAC  Level of Consciousness: drowsy  Airway & Oxygen Therapy: Patient Spontanous Breathing and Patient connected to face mask oxygen  Post-op Assessment: Report given to RN and Post -op Vital signs reviewed and stable  Post vital signs: Reviewed and stable  Last Vitals:  Vitals:   01/04/18 0154 01/04/18 0525  BP:  (!) 151/65  Pulse:  77  Resp:  20  Temp:  36.9 C  SpO2: 95% 95%    Last Pain:  Vitals:   01/04/18 0525  TempSrc: Oral  PainSc:          Complications: No apparent anesthesia complications

## 2018-01-04 NOTE — Telephone Encounter (Signed)
Sched appt 02/16/18 at 11:45. Lm on hm#.

## 2018-01-04 NOTE — Progress Notes (Signed)
No bruit or thrill at right AVF even with Doppler. Fingers are warm, 2+ radial pulse. Lennie Muckle PA here to check, and updated Dr Bridgett Larsson about the above. He said to keep monitoring.  They may need to plan further intervention in 2 weeks, will use Diatek in the meantime.

## 2018-01-04 NOTE — Interval H&P Note (Signed)
History and Physical Interval Note:  01/04/2018 7:12 AM  Pamela Campbell  has presented today for surgery, with the diagnosis of end stage renal disease  The various methods of treatment have been discussed with the patient and family. After consideration of risks, benefits and other options for treatment, the patient has consented to  Procedure(s): ARTERIOVENOUS (AV) FISTULA CREATION RIGHT ARM (Right) INSERTION OF DIALYSIS CATHETER (N/A) as a surgical intervention .  The patient's history has been reviewed, patient examined, no change in status, stable for surgery.  I have reviewed the patient's chart and labs.  Questions were answered to the patient's satisfaction.     Adele Barthel

## 2018-01-04 NOTE — Anesthesia Postprocedure Evaluation (Signed)
Anesthesia Post Note  Patient: Pamela Campbell  Procedure(s) Performed: ARTERIOVENOUS (AV) FISTULA CREATION RIGHT ARM (Right ) INSERTION OF DIALYSIS CATHETER RIGHT INTERNAL JUGULAR (Right Neck)     Patient location during evaluation: PACU Anesthesia Type: MAC Level of consciousness: awake and alert Pain management: pain level controlled Vital Signs Assessment: post-procedure vital signs reviewed and stable Respiratory status: spontaneous breathing, nonlabored ventilation and respiratory function stable Cardiovascular status: stable and blood pressure returned to baseline Postop Assessment: no apparent nausea or vomiting Anesthetic complications: no    Last Vitals:  Vitals:   01/04/18 1045 01/04/18 1053  BP: (!) 156/79 (!) 148/76  Pulse: 73 70  Resp: 18 (!) 21  Temp:  36.7 C  SpO2: 95% 96%    Last Pain:  Vitals:   01/04/18 1053  TempSrc:   PainSc: 2                  Timmie Dugue,W. EDMOND

## 2018-01-04 NOTE — Progress Notes (Signed)
[facility] Progress Note   Assessment/ Plan:   1. 1. Acute on chronic dCHF exac/ HTNsive emergency: initially came with hypertensive emergency requiring nicardipine gtt and vol overload.  Now off gtt and getting vol removal with HD.  EF 41-66%, grade 1 diastolic dysfunction. Cardiac cath 10/2016: Nonobstructive CAD.  On norvasc and labetalol, expect pressures to improve with UF but may need more agents. 2.ESRD: initiated HD 12/31/2017.  HD #3 1/14.  Tolerating well.  S/p TDC and R RC fistula with Dr. Bridgett Larsson.  R RC fistula with weak T/B--> however op note noted diffuse artery spasm.   3. Anemia: Hgb 8.8, ESA initiated. % sat 14, doing ferrlicit  4. 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 Nutrition:albumin 2.6, will start prostat and on nepro 6.  Dispo: has beel CLIP'd--> will dialyze tomorrow and then can go from renal perspective   Subjective:    S/p TDC and R RC fistula today   Objective:   BP (!) 163/69 (BP Location: Left Arm)   Pulse 72   Temp (!) 97.4 F (36.3 C) (Oral)   Resp 18   Ht 5\' 4"  (1.626 m)   Wt 114.5 kg (252 lb 8 oz)   LMP 02/02/2015   SpO2 98%   BMI 43.34 kg/m   Physical Exam: Gen: sitting in bed, NAD CVS: RRR soft systolic murmur Resp: clear bilaterally Abd: soft, mildly diffusely tender Ext: 2+ LE edema ACCESS: R TDC in place, R RC fistula with weak T/B  Labs: BMET Recent Labs  Lab 12/30/17 1400 12/31/17 0815 01/01/18 0356 01/02/18 1050 01/03/18 0710 01/04/18 0617  NA 140 138 139 137 136 137  K 3.9 3.6 3.8 3.3* 3.6 4.2  CL 111 110 108 104 103 103  CO2 18* 21* 22 21* 22 22  GLUCOSE 150* 163* 129* 201* 126* 125*  BUN 59* 55* 45* 50* 43* 41*  CREATININE 7.19* 7.43* 6.50* 7.12* 6.02* 5.78*  CALCIUM 7.9* 7.9* 8.0* 8.3* 8.2* 8.5*  PHOS  --   --  5.0* 5.3* 4.1  --    CBC Recent Labs  Lab 12/30/17 1400  01/01/18 0356 01/02/18 1050 01/03/18 0715 01/04/18 0617  WBC 14.8*   < > 9.5 11.0* 13.2* 13.3*   NEUTROABS 11.2*  --   --   --   --   --   HGB 9.5*   < > 8.7* 8.8* 8.8* 9.2*  HCT 30.5*   < > 27.9* 28.2* 27.5* 30.3*  MCV 83.6   < > 84.8 84.2 83.6 86.3  PLT 324   < > 273 282 228 220   < > = values in this interval not displayed.    @IMGRELPRIORS @ Medications:    . amLODipine  10 mg Oral Daily  . calcitRIOL  0.25 mcg Oral Once per day on Mon Wed Fri  . [START ON 01/10/2018] darbepoetin (ARANESP) injection - DIALYSIS  60 mcg Intravenous Q Mon-HD  . dextromethorphan  15 mg Oral BID  . famotidine  20 mg Oral Daily  . feeding supplement (PRO-STAT SUGAR FREE 64)  30 mL Oral BID  . heparin  5,000 Units Subcutaneous Q8H  . insulin aspart  0-9 Units Subcutaneous TID WC  . ipratropium-albuterol  3 mL Nebulization Q6H  . labetalol  200 mg Oral BID  . ondansetron (ZOFRAN) IV  4 mg Intravenous Q6H  . sodium chloride flush  3 mL Intravenous Q12H     Madelon Lips MD [facility]  pgr [phone] 01/04/2018, 2:20 PM

## 2018-01-04 NOTE — Progress Notes (Signed)
Accepted at [place] .1st treatment Friday,January 18,2019 @06 :45am. Schedule and chairtime Monday,Wednesday,Friday 07:15am 1st shift.

## 2018-01-04 NOTE — Telephone Encounter (Signed)
-----   Message from Mena Goes, RN sent at 01/04/2018  9:56 AM EST ----- Regarding: 6-8 weeks postop   ----- Message ----- From: Conrad Grandview, MD Sent: 01/04/2018   9:48 AM To: Vvs Charge [address]  Pamela Campbell [id number] 29-Jul-1961   PROCEDURE: 1. Conversion of right internal jugular vein temporary dialysis catheter to tunneled dialysis catheter  2.  Right radiocephalic arteriovenous fistula   Asst: Dagoberto Ligas, [facility]   Follow-up: 6-8 weeks

## 2018-01-05 ENCOUNTER — Telehealth: Payer: Self-pay | Admitting: Vascular Surgery

## 2018-01-05 ENCOUNTER — Encounter: Payer: Self-pay | Admitting: Family Medicine

## 2018-01-05 ENCOUNTER — Encounter (HOSPITAL_COMMUNITY): Payer: Self-pay | Admitting: Vascular Surgery

## 2018-01-05 LAB — CBC
HCT: 27 % — ABNORMAL LOW (ref 36.0–46.0)
Hemoglobin: 8.3 g/dL — ABNORMAL LOW (ref 12.0–15.0)
MCH: 26.5 pg (ref 26.0–34.0)
MCHC: 30.7 g/dL (ref 30.0–36.0)
MCV: 86.3 fL (ref 78.0–100.0)
Platelets: 229 10*3/uL (ref 150–400)
RBC: 3.13 MIL/uL — ABNORMAL LOW (ref 3.87–5.11)
RDW: 16.1 % — AB (ref 11.5–15.5)
WBC: 13.1 10*3/uL — AB (ref 4.0–10.5)

## 2018-01-05 LAB — RENAL FUNCTION PANEL
Albumin: 2.5 g/dL — ABNORMAL LOW (ref 3.5–5.0)
Anion gap: 11 (ref 5–15)
BUN: 50 mg/dL — AB (ref 6–20)
CALCIUM: 8.3 mg/dL — AB (ref 8.9–10.3)
CO2: 23 mmol/L (ref 22–32)
CREATININE: 6.84 mg/dL — AB (ref 0.44–1.00)
Chloride: 102 mmol/L (ref 101–111)
GFR calc Af Amer: 7 mL/min — ABNORMAL LOW (ref >60)
GFR, EST NON AFRICAN AMERICAN: 6 mL/min — AB (ref >60)
GLUCOSE: 124 mg/dL — AB (ref 65–99)
PHOSPHORUS: 6.2 mg/dL — AB (ref 2.5–4.6)
Potassium: 4.5 mmol/L (ref 3.5–5.1)
SODIUM: 136 mmol/L (ref 135–145)

## 2018-01-05 LAB — GLUCOSE, CAPILLARY: GLUCOSE-CAPILLARY: 106 mg/dL — AB (ref 65–99)

## 2018-01-05 MED ORDER — DEXTROMETHORPHAN POLISTIREX ER 30 MG/5ML PO SUER: 15.0000 mg | Freq: Two times a day (BID) | ORAL | 0 refills | Status: DC

## 2018-01-05 MED ORDER — FAMOTIDINE 20 MG PO TABS: 20.0000 mg | ORAL_TABLET | Freq: Every day | ORAL | 0 refills | Status: DC

## 2018-01-05 MED ORDER — HYDROXYZINE HCL 25 MG PO TABS: 25.0000 mg | ORAL_TABLET | Freq: Three times a day (TID) | ORAL | 0 refills | Status: DC | PRN

## 2018-01-05 MED ORDER — AMLODIPINE BESYLATE 10 MG PO TABS: 10.0000 mg | ORAL_TABLET | Freq: Every day | ORAL | 1 refills | Status: DC

## 2018-01-05 MED ORDER — LABETALOL HCL 200 MG PO TABS: 200.0000 mg | ORAL_TABLET | Freq: Two times a day (BID) | ORAL | 1 refills | Status: DC

## 2018-01-05 NOTE — Consult Note (Signed)
   [facility] CM Inpatient Consult   01/05/2018  ANNAI HEICK [birth year] [id number]  Patient screened for potential [facility] services. Patient is in the [place] of the [facility] services under patient's [facility] plan.  Met with the patient at the bedside and the patient made aware of [facility] management for transition of care follow up. Patient accepted a brochure with 24 hour nurse advise line magnet with contact information.  For questions contact:   Natividad Brood, RN BSN [facility] Liaison  [phone] business mobile phone Toll free office [phone]

## 2018-01-05 NOTE — Progress Notes (Signed)
[facility] Progress Note   Assessment/ Plan:   1. 1. Acute on chronic dCHF exac/ HTNsive emergency: initially came with hypertensive emergency requiring nicardipine gtt and vol overload.  EF 83-38%, grade 1 diastolic dysfunction. Cardiac cath 10/2016: Nonobstructive CAD.  On norvasc and labetalol, pressures improved.   2.ESRD: initiated HD 12/31/2017.  HD #4 1/16.  Tolerating well.  S/p TDC and R RC fistula with Dr. Bridgett Larsson.  R RC fistula can't hear T/B-> however op note noted diffuse artery spasm.  Will touch base with VVS.  CLIP'd to [facility].   3. Anemia: Hgb 9.2. ESA initiated. % sat 14, doing ferrlicit.  Aranesp 60 mcg q Monday 4. 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 Nutrition:albumin 2.6, prostat and on nepro 6. Viral bronchitis: no evidence of PNA on CXR, improving 7.  Dispo: has beel CLIP'd-->OK to go from renal perspective after dialysis    Subjective:    Seen on HD.  Tolerating treatment well.  Still reports a lingering cough (dx with viral bronchitis).  R arm still sore.     Objective:   BP (!) 161/70 (BP Location: Left Arm)   Pulse 89   Temp 99.8 F (37.7 C) (Oral)   Resp 20   Ht 5\' 4"  (1.626 m)   Wt 115.3 kg (254 lb 1.6 oz)   LMP 02/02/2015   SpO2 100%   BMI 43.62 kg/m   Physical Exam: Gen: sitting in bed, NAD CVS: RRR soft systolic murmur Resp: clear bilaterally anteriorly, muffled at bases Abd: soft, obese, nontender Ext: 1+ LE edema improved ACCESS: R TDC in place, R RC fistula can't ascultate T/B  Labs: BMET Recent Labs  Lab 12/30/17 1400 12/31/17 0815 01/01/18 0356 01/02/18 1050 01/03/18 0710 01/04/18 0617  NA 140 138 139 137 136 137  K 3.9 3.6 3.8 3.3* 3.6 4.2  CL 111 110 108 104 103 103  CO2 18* 21* 22 21* 22 22  GLUCOSE 150* 163* 129* 201* 126* 125*  BUN 59* 55* 45* 50* 43* 41*  CREATININE 7.19* 7.43* 6.50* 7.12* 6.02* 5.78*  CALCIUM 7.9* 7.9* 8.0* 8.3* 8.2* 8.5*  PHOS  --   --  5.0* 5.3* 4.1  --     CBC Recent Labs  Lab 12/30/17 1400  01/01/18 0356 01/02/18 1050 01/03/18 0715 01/04/18 0617  WBC 14.8*   < > 9.5 11.0* 13.2* 13.3*  NEUTROABS 11.2*  --   --   --   --   --   HGB 9.5*   < > 8.7* 8.8* 8.8* 9.2*  HCT 30.5*   < > 27.9* 28.2* 27.5* 30.3*  MCV 83.6   < > 84.8 84.2 83.6 86.3  PLT 324   < > 273 282 228 220   < > = values in this interval not displayed.    @IMGRELPRIORS @ Medications:    . amLODipine  10 mg Oral Daily  . calcitRIOL  0.25 mcg Oral Once per day on Mon Wed Fri  . [START ON 01/10/2018] darbepoetin (ARANESP) injection - DIALYSIS  60 mcg Intravenous Q Mon-HD  . dextromethorphan  15 mg Oral BID  . famotidine  20 mg Oral Daily  . feeding supplement (PRO-STAT SUGAR FREE 64)  30 mL Oral BID  . heparin  5,000 Units Subcutaneous Q8H  . insulin aspart  0-9 Units Subcutaneous TID WC  . ipratropium-albuterol  3 mL Nebulization TID  . labetalol  200 mg Oral BID  . sodium chloride flush  3 mL Intravenous Q12H     Madelon Lips MD [facility] pgr [phone] 01/05/2018, 7:41 AM

## 2018-01-05 NOTE — Plan of Care (Signed)
  Education: Knowledge of General Education information will improve 01/05/2018 1100 - Not Progressing by Imagene Gurney, RN

## 2018-01-05 NOTE — Progress Notes (Signed)
Reviewed all discharge instructions with patient and she stated understanding.  She confirmed she has personal belongings she brought to hospital as follows: Cell phone, phone charge and stand for phone, clothing, and shoes.   Stated she will call her daughter to pick her up.

## 2018-01-05 NOTE — Procedures (Signed)
Patient seen and examined on Hemodialysis. QB 400 mL/ min via R IJ TDC, UF goal 2.5L net.  Tolerating well so far.    Treatment adjusted as needed.  Madelon Lips MD [facility] pgr [phone] 7:48 AM

## 2018-01-05 NOTE — Progress Notes (Signed)
  Progress Note    01/05/2018 1:01 PM 1 Day Post-Op  Subjective:  Soreness to R arm incisions; complains of persistent cough   Vitals:   01/05/18 1100 01/05/18 1130  BP: (!) 172/82 (!) 153/68  Pulse: 80 81  Resp:  (!) 21  Temp:  98.7 F (37.1 C)  SpO2:     Physical Exam: Lungs:  Non labored Incisions:  R wrist incisions without hematoma; no palpable thrill or pulsatile flow through R radiocephalic fistula; no audible bruit; palpable R radial pulse Neurologic: A&O  CBC    Component Value Date/Time   WBC 13.1 (H) 01/05/2018 0650   RBC 3.13 (L) 01/05/2018 0650   HGB 8.3 (L) 01/05/2018 0650   HCT 27.0 (L) 01/05/2018 0650   PLT 229 01/05/2018 0650   MCV 86.3 01/05/2018 0650   MCH 26.5 01/05/2018 0650   MCHC 30.7 01/05/2018 0650   RDW 16.1 (H) 01/05/2018 0650   LYMPHSABS 2.3 12/30/2017 1400   MONOABS 1.0 12/30/2017 1400   EOSABS 0.2 12/30/2017 1400   BASOSABS 0.0 12/30/2017 1400    BMET    Component Value Date/Time   NA 136 01/05/2018 0653   NA 141 10/19/2017 1533   K 4.5 01/05/2018 0653   CL 102 01/05/2018 0653   CO2 23 01/05/2018 0653   GLUCOSE 124 (H) 01/05/2018 0653   BUN 50 (H) 01/05/2018 0653   BUN 68 (H) 10/19/2017 1533   CREATININE 6.84 (H) 01/05/2018 0653   CREATININE 2.18 (H) 12/03/2016 1141   CALCIUM 8.3 (L) 01/05/2018 0653   GFRNONAA 6 (L) 01/05/2018 0653   GFRNONAA 25 (L) 12/03/2016 1141   GFRAA 7 (L) 01/05/2018 0653   GFRAA 29 (L) 12/03/2016 1141    INR    Component Value Date/Time   INR 1.07 01/04/2018 0617     Intake/Output Summary (Last 24 hours) at 01/05/2018 1301 Last data filed at 01/05/2018 1130 Gross per 24 hour  Intake 480 ml  Output 2675 ml  Net -2195 ml     Assessment/Plan:  57 y.o. female is s/p R radiocephalic fistula creation by Dr. Bridgett Larsson 1 Day Post-Op   TDC site without hematoma; able to use for HD treatment New R radiocephalic fistula already clotted likely due to caliber of radial artery as well as possible  proximal occlusion of cephalic vein Patient will continue HS from [facility] Follow up in office to discuss next option for permanent access Vascular surgery will sign off   Dagoberto Ligas, PA-C [facility] [phone] 01/05/2018 1:01 PM

## 2018-01-05 NOTE — Telephone Encounter (Signed)
Cxl'd 02/16/18 appt from previous staff message. Sched new appt 01/21/18 at 3:15. Lm on hm# to inform pt of change.

## 2018-01-05 NOTE — Discharge Summary (Signed)
Physician Discharge Summary  Pamela Campbell FVC:[id number] DOB: 12-07-1961 DOA: 12/30/2017  PCP: Dineen Kid, MD  Admit date: 12/30/2017 Discharge date: 01/05/2018  Time spent: 25 minutes  Recommendations for Outpatient Follow-up:  1. New start to HD-clipped to [facility] 2. Started new meds of amlodipine this admit but cut back labetalol dosing--started Calcitriol as well  Discharge Diagnoses:  Principal Problem:   Acute on chronic diastolic heart failure (HCC) Active Problems:   Cough in adult   Uncontrolled hypertension   Diabetes mellitus type 2 in obese (HCC)   CKD (chronic kidney disease), stage V (HCC)   Metabolic acidosis   Acute on chronic diastolic CHF (congestive heart failure) ([place])   Discharge Condition: improved  Diet recommendation:  Renal;  Filed Weights   01/05/18 0629 01/05/18 0700 01/05/18 1130  Weight: 115.3 kg (254 lb 1.6 oz) 115.7 kg (255 lb 1.2 oz) 113.2 kg (249 lb 9 oz)    History of present illness:  48 F poorly controlled HTN, DM 2, chronic kidney disease, chronic diastolic CHF, medical noncompliance, obesity, recent hospital discharge 12/21 for hypertensive urgency requiring nicardipine drip, was sent to ED from PCP office due to dyspnea, wheezing, nausea. Initial blood pressure 284/150. She also reported subjective fevers, chills, nasal congestion and mostly nonproductive cough. Exposed to adult son with URI symptoms prior to her getting sick. Admitted for acute on chronic diastolic CHF, hypertensive urgency and acute bronchitis. Cardiology and nephrology consulting. Started HD. [facility] plan TDC and AVF 1/15    Hospital Course:  Acute on chronic diastolic CHF:  progressive chronic kidney disease, poorly controlled hypertension and noncompliance. 2-D echo 12/31/17: LVEF 60-65 percent and grade 1 diastolic dysfunction. Cardiac cath 10/2016: Nonobstructive CAD. Initiated hemodialysis on 1/11 Cough is from fluid-added Delsym and reassured patient and is stable for d/c  home  Hypertensive urgency: on labetalol 200 MG twice a day and amlodipine 10 MG daily. -mod controlled-expect will be better controlled postHG after more attempts  Acute viral bronchitis: Exposed to an adult son with URI symptoms. Chest x-ray without pneumonia. Supportive treatment. RSV panel negative. Pro-calcitonin 0.10. Improved and no further work-up  New ESRD/Metabolic acidosis 2/2 HTN urgency in setting of DM Glomerulonephropathy: Her renal functions have been rapidly declining over the past year. Creatinine was 1.84 in November 2017 and now presented with creatinine >7. ? nausea -- uremia or congestive gastropathy from CHF-Zofran ordered. Nephrology consultation and follow-up appreciated. Started HD via right IJ temporary HD catheter. Fistula clotted and has appt for follow up with [facility] as OP 01/21/18 for re-eval of the situation  Type II DM with renal complications: M3W: 7.4. Monitor CBGs and SSI. Not on meds prior and refuses to use meds as sugars have been "controlled" in 100-140 range  Anemia of chronic kidney disease: Follow CBCs. May consider Aranesp. Stable. Iron 33, sat 14  Morbid obesity/Body mass index is 43.34 kg/m    Procedures:  L IJ  Fisutla placement 1/15  Consultations:  nephro  [facility]  Discharge Exam: Vitals:   01/05/18 1100 01/05/18 1130  BP: (!) 172/82 (!) 153/68  Pulse: 80 81  Resp:  (!) 21  Temp:  98.7 F (37.1 C)  SpO2:      General: awake alert seen on HD unti but in no distress Cardiovascular:  s1 s 2no m/r/g Respiratory: clear no added sound, no le edema  Discharge Instructions   Discharge Instructions    Diet - low sodium heart healthy   Complete by:  As directed  Discharge instructions   Complete by:  As directed    Get your medications form the pharmacy-Vascular surgery will be in touch with you regarding further needs for your access and fistula follow up with dialysis center for dialysis and go without fail   Increase  activity slowly   Complete by:  As directed      Allergies as of 01/05/2018      Reactions   Compazine [prochlorperazine Edisylate] Swelling   Prochlorperazine Swelling   Tongue swelling    Vioxx [rofecoxib] Other (See Comments)   Other reaction(s): Bleeding (intolerance) Bleeding      Medication List    STOP taking these medications   torsemide 20 MG tablet Commonly known as:  DEMADEX     TAKE these medications   amLODipine 10 MG tablet Commonly known as:  NORVASC Take 1 tablet (10 mg total) by mouth daily. Start taking on:  01/06/2018   calcitRIOL 0.25 MCG capsule Commonly known as:  ROCALTROL Take 0.25 capsules by mouth daily.   dextromethorphan 30 MG/5ML liquid Commonly known as:  DELSYM Take 2.5 mLs (15 mg total) by mouth 2 (two) times daily.   famotidine 20 MG tablet Commonly known as:  PEPCID Take 1 tablet (20 mg total) by mouth daily. Start taking on:  01/06/2018   hydrALAZINE 50 MG tablet Commonly known as:  APRESOLINE Take 1 tablet (50 mg total) by mouth 3 (three) times daily.   hydrOXYzine 25 MG tablet Commonly known as:  ATARAX/VISTARIL Take 1 tablet (25 mg total) by mouth every 8 (eight) hours as needed for itching.   labetalol 200 MG tablet Commonly known as:  NORMODYNE Take 1 tablet (200 mg total) by mouth 2 (two) times daily. What changed:  how much to take   ondansetron 4 MG disintegrating tablet Commonly known as:  ZOFRAN-ODT Take 1 tablet (4 mg total) by mouth every 6 (six) hours as needed for nausea or vomiting.      Allergies  Allergen Reactions  . Compazine [Prochlorperazine Edisylate] Swelling  . Prochlorperazine Swelling    Tongue swelling   . Vioxx [Rofecoxib] Other (See Comments)    Other reaction(s): Bleeding (intolerance) Bleeding      The results of significant diagnostics from this hospitalization (including imaging, microbiology, ancillary and laboratory) are listed below for reference.    Significant Diagnostic  Studies: Dg Chest 2 View  Result Date: 12/30/2017 CLINICAL DATA:  Two weeks of shortness of breath and cough. History of CHF, hypertension, diabetes former smoker. EXAM: CHEST  2 VIEW COMPARISON:  Chest x-ray of December 08, 2017 FINDINGS: The lungs are well-expanded. The interstitial markings remain increased. The pulmonary vascularity while in [place] is not as conspicuous as on the previous study. The cardiac silhouette remains enlarged. There is no significant pleural effusion. The observed bony thorax is unremarkable. IMPRESSION: CHF with mild interstitial edema not as conspicuous as on the previous study. No acute pneumonia. Electronically Signed   By: David  Martinique M.D.   On: 12/30/2017 13:48   Dg Chest 2 View  Result Date: 12/08/2017 CLINICAL DATA:  Intermittent shortness of breath, severe today. History of CHF. EXAM: CHEST  2 VIEW COMPARISON:  Chest radiograph October 24, 2016 FINDINGS: Moderate cardiomegaly, increased from prior examination. Pulmonary vascular congestion and interstitial prominence without pleural effusion or focal consolidation. No pneumothorax. Large body habitus. Osseous structures are nonsuspicious. IMPRESSION: Moderate cardiomegaly and interstitial edema. Electronically Signed   By: Elon Alas M.D.   On: 12/08/2017 19:45  Ir Fluoro Guide Cv Line Right  Result Date: 12/31/2017 INDICATION: 57 year old female with end-stage renal disease in need of hemodialysis EXAM: IR RIGHT FLOURO GUIDE CV LINE; IR ULTRASOUND GUIDANCE VASC ACCESS RIGHT MEDICATIONS: None ANESTHESIA/SEDATION: None FLUOROSCOPY TIME:  Fluoroscopy Time: 0 minutes 6 seconds (2 mGy). COMPLICATIONS: None immediate. PROCEDURE: Informed written consent was obtained from the patient after a thorough discussion of the procedural risks, benefits and alternatives. All questions were addressed. Maximal Sterile Barrier Technique was utilized including caps, mask, sterile gowns, sterile gloves, sterile drape,  hand hygiene and skin antiseptic. A timeout was performed prior to the initiation of the procedure. The right internal jugular vein was interrogated with ultrasound and found to be widely patent. An image was obtained and stored for the medical record. Local anesthesia was attained by infiltration with 1% lidocaine. A small dermatotomy was made. Under real-time sonographic guidance, the vessel was punctured with a 21 gauge micropuncture needle. Using standard technique, the initial micro needle was exchanged over a 0.018 micro wire for a transitional 4 [place] micro sheath. The micro sheath was removed in the soft tissue tract dilated. A 20 [place] Trialysis catheter was then advanced over the wire and the catheter tips were positioned in the mid right atrium under fluoroscopy. An image was obtained and stored. The catheter was flushed with heparinized saline and secured to the skin with 0 Prolene suture. A sterile bandage was applied. IMPRESSION: Successful placement of a right IJ approach 20 cm Trialysis catheter. The catheter tips are in the right atrium and ready for immediate use. Electronically Signed   By: Jacqulynn Cadet M.D.   On: 12/31/2017 17:00   Ir US Guide Vasc Access Right  Result Date: 12/31/2017 INDICATION: 57 year old female with end-stage renal disease in need of hemodialysis EXAM: IR RIGHT FLOURO GUIDE CV LINE; IR ULTRASOUND GUIDANCE VASC ACCESS RIGHT MEDICATIONS: None ANESTHESIA/SEDATION: None FLUOROSCOPY TIME:  Fluoroscopy Time: 0 minutes 6 seconds (2 mGy). COMPLICATIONS: None immediate. PROCEDURE: Informed written consent was obtained from the patient after a thorough discussion of the procedural risks, benefits and alternatives. All questions were addressed. Maximal Sterile Barrier Technique was utilized including caps, mask, sterile gowns, sterile gloves, sterile drape, hand hygiene and skin antiseptic. A timeout was performed prior to the initiation of the procedure. The right internal  jugular vein was interrogated with ultrasound and found to be widely patent. An image was obtained and stored for the medical record. Local anesthesia was attained by infiltration with 1% lidocaine. A small dermatotomy was made. Under real-time sonographic guidance, the vessel was punctured with a 21 gauge micropuncture needle. Using standard technique, the initial micro needle was exchanged over a 0.018 micro wire for a transitional 4 [place] micro sheath. The micro sheath was removed in the soft tissue tract dilated. A 20 [place] Trialysis catheter was then advanced over the wire and the catheter tips were positioned in the mid right atrium under fluoroscopy. An image was obtained and stored. The catheter was flushed with heparinized saline and secured to the skin with 0 Prolene suture. A sterile bandage was applied. IMPRESSION: Successful placement of a right IJ approach 20 cm Trialysis catheter. The catheter tips are in the right atrium and ready for immediate use. Electronically Signed   By: Jacqulynn Cadet M.D.   On: 12/31/2017 17:00   Dg Chest Port 1 View  Result Date: 01/04/2018 CLINICAL DATA:  Postop dialysis catheter insertion EXAM: PORTABLE CHEST 1 VIEW COMPARISON:  12/30/2017 FINDINGS: There is interval placement of  a right IJ dialysis catheter with the tip projecting over the right atrium. There is mild bilateral interstitial prominence. There is no pleural effusion or pneumothorax. There is stable cardiomegaly. The osseous structures are unremarkable. IMPRESSION: Interval placement of a right IJ dialysis catheter with the tip projecting over the right atrium. Mild interstitial edema. Electronically Signed   By: Kathreen Devoid   On: 01/04/2018 10:43   Dg Fluoro Guide Cv Line-no Report  Result Date: 01/04/2018 Fluoroscopy was utilized by the requesting physician.  No radiographic interpretation.    Microbiology: Recent Results (from the past 240 hour(s))  Respiratory Panel by PCR     Status:  None   Collection Time: 12/30/17  4:22 PM  Result Value Ref Range Status   Adenovirus NOT DETECTED NOT DETECTED Final   Coronavirus 229E NOT DETECTED NOT DETECTED Final   Coronavirus HKU1 NOT DETECTED NOT DETECTED Final   Coronavirus NL63 NOT DETECTED NOT DETECTED Final   Coronavirus OC43 NOT DETECTED NOT DETECTED Final   Metapneumovirus NOT DETECTED NOT DETECTED Final   Rhinovirus / Enterovirus NOT DETECTED NOT DETECTED Final   Influenza A NOT DETECTED NOT DETECTED Final   Influenza B NOT DETECTED NOT DETECTED Final   Parainfluenza Virus 1 NOT DETECTED NOT DETECTED Final   Parainfluenza Virus 2 NOT DETECTED NOT DETECTED Final   Parainfluenza Virus 3 NOT DETECTED NOT DETECTED Final   Parainfluenza Virus 4 NOT DETECTED NOT DETECTED Final   Respiratory Syncytial Virus NOT DETECTED NOT DETECTED Final   Bordetella pertussis NOT DETECTED NOT DETECTED Final   Chlamydophila pneumoniae NOT DETECTED NOT DETECTED Final   Mycoplasma pneumoniae NOT DETECTED NOT DETECTED Final  Surgical pcr screen     Status: None   Collection Time: 01/03/18 11:59 PM  Result Value Ref Range Status   MRSA, PCR NEGATIVE NEGATIVE Final   Staphylococcus aureus NEGATIVE NEGATIVE Final    Comment: (NOTE) The Xpert SA Assay (FDA approved for NASAL specimens in patients 60 years of age and older), is one component of a comprehensive surveillance program. It is not intended to diagnose infection nor to guide or monitor treatment.      Labs: Basic Metabolic Panel: Recent Labs  Lab 12/30/17 1703  01/01/18 0356 01/02/18 1050 01/03/18 0710 01/04/18 0617 01/05/18 0653  NA  --    < > 139 137 136 137 136  K  --    < > 3.8 3.3* 3.6 4.2 4.5  CL  --    < > 108 104 103 103 102  CO2  --    < > 22 21* 22 22 23   GLUCOSE  --    < > 129* 201* 126* 125* 124*  BUN  --    < > 45* 50* 43* 41* 50*  CREATININE  --    < > 6.50* 7.12* 6.02* 5.78* 6.84*  CALCIUM  --    < > 8.0* 8.3* 8.2* 8.5* 8.3*  MG 1.9  --   --   --   --   --    --   PHOS  --   --  5.0* 5.3* 4.1  --  6.2*   < > = values in this interval not displayed.   Liver Function Tests: Recent Labs  Lab 12/31/17 1920 01/01/18 0356 01/02/18 1050 01/03/18 0710 01/05/18 0653  ALT 25  --   --   --   --   ALBUMIN  --  2.5* 2.5* 2.6* 2.5*   No results for input(s):  LIPASE, AMYLASE in the last 168 hours. No results for input(s): AMMONIA in the last 168 hours. CBC: Recent Labs  Lab 12/30/17 1400  01/01/18 0356 01/02/18 1050 01/03/18 0715 01/04/18 0617 01/05/18 0650  WBC 14.8*   < > 9.5 11.0* 13.2* 13.3* 13.1*  NEUTROABS 11.2*  --   --   --   --   --   --   HGB 9.5*   < > 8.7* 8.8* 8.8* 9.2* 8.3*  HCT 30.5*   < > 27.9* 28.2* 27.5* 30.3* 27.0*  MCV 83.6   < > 84.8 84.2 83.6 86.3 86.3  PLT 324   < > 273 282 228 220 229   < > = values in this interval not displayed.   Cardiac Enzymes: No results for input(s): CKTOTAL, CKMB, CKMBINDEX, TROPONINI in the last 168 hours. BNP: BNP (last 3 results) Recent Labs    12/08/17 1926 12/30/17 1400  BNP 563.5* 446.8*    ProBNP (last 3 results) No results for input(s): PROBNP in the last 8760 hours.  CBG: Recent Labs  Lab 01/04/18 1014 01/04/18 1258 01/04/18 1656 01/04/18 2047 01/05/18 1256  GLUCAP 117* 118* 132* 159* 106*       Signed:  Nita Sells MD   [facility] 01/05/2018, 2:51 PM

## 2018-01-05 NOTE — Telephone Encounter (Signed)
-----   Message from Conrad [place], MD sent at 01/04/2018  6:53 PM EST ----- Regarding: RE: 2 weeks with Bridgett Larsson to discuss new access Yes, looks like access clotted  ----- Message ----- From: Georgiann Mccoy Sent: 01/04/2018   3:23 PM To: Conrad [place], MD Subject: Melton Alar: 2 weeks with Bridgett Larsson to discuss new access    Does this cxl out the 6-8 w f/u you sent previously?  ----- Message ----- From: Mena Goes, RN Sent: 01/04/2018  12:30 PM To: [facility] Admin Pool Subject: 2 weeks with Bridgett Larsson to discuss new access          ----- Message ----- From: Iline Oven Sent: 01/04/2018  10:44 AM To: [facility] Charge Pool  PO R radiocephalic clotted in PACU.  This patient needs an appt with Dr. Bridgett Larsson in about 2 weeks.  Thanks, MAtt

## 2018-01-11 ENCOUNTER — Other Ambulatory Visit: Payer: Self-pay | Admitting: *Deleted

## 2018-01-11 NOTE — Patient Outreach (Signed)
[facility]) Care Management  01/11/2018  JANINE RELLER April 03, 1961 [id number]  EMMI- Heart Failure RED ON EMMI ALERT DAY#: 4 DATE: 01/10/18 RED ALERT: New/worsening problems? Yes   Outreach attempt #1 to patient. No answer and unable to leave voicemail message.   Plan: RN CM will contact patient within one week.    [name], RN, BSN, MHA/MSL, [place] Telephonic Care Manager Coordinator [facility] Direct Phone: [phone] Cell Phone: [phone] Toll Free: [phone] Fax: [phone]

## 2018-01-12 ENCOUNTER — Encounter: Payer: Self-pay | Admitting: *Deleted

## 2018-01-12 ENCOUNTER — Ambulatory Visit: Payer: Self-pay | Admitting: *Deleted

## 2018-01-12 ENCOUNTER — Other Ambulatory Visit: Payer: Self-pay | Admitting: *Deleted

## 2018-01-12 NOTE — Patient Outreach (Signed)
[name]) Care Management  01/12/2018  Pamela Campbell 28-Aug-1961 [id number]   EMMI- Heart Failure RED ON EMMI ALERT DAY#: 4 DATE: 01/10/18 RED ALERT: New/worsening problems? Yes   Outreach attempt #2 to patient. No answer and unable to leave voicemail message.   Plan: RN CM will send unsuccessful outreach letter to patient and will close case if no response from patient within 10 business days.   [name], RN, BSN, MHA/MSL, [place] Telephonic Care Manager Coordinator [facility] Direct Phone: [phone] Cell Phone: [phone] Toll Free: [phone] Fax: [phone]

## 2018-01-17 NOTE — Progress Notes (Signed)
Postoperative Access Visit   History of Present Illness   Pamela Campbell is a 57 y.o. year old female who presents for postoperative follow-up for: right radiocephalic arteriovenous fistula (Date: 01/04/18).  The patient's wounds are healed.  The patient notes no steal symptoms.  The patient is able to complete their activities of daily living.  The patient's current symptoms are: none.  Past Medical History:  Diagnosis Date  . Chronic diastolic CHF (congestive heart failure) ([place])    a. 03/2015: echo w/ EF of 50-55%, no WMA, Grade 2 DD, trivial AI, mild MR.  . Diabetes mellitus without complication ([place])   . Hypertension     Past Surgical History:  Procedure Laterality Date  . AV FISTULA PLACEMENT Right 01/04/2018   Procedure: ARTERIOVENOUS (AV) FISTULA CREATION RIGHT ARM;  Surgeon: Conrad Meridian, MD;  Location: [facility];  Service: Vascular;  Laterality: Right;  . CARDIAC CATHETERIZATION N/A 11/02/2016   Procedure: Left Heart Cath and Coronary Angiography;  Surgeon: Sherren Mocha, MD;  Location: [facility];  Service: Cardiovascular;  Laterality: N/A;  . CESAREAN SECTION    . FRACTURE SURGERY    . INSERTION OF DIALYSIS CATHETER Right 01/04/2018   Procedure: INSERTION OF DIALYSIS CATHETER RIGHT INTERNAL JUGULAR;  Surgeon: Conrad [place], MD;  Location: [facility];  Service: Vascular;  Laterality: Right;  . IR FLUORO GUIDE CV LINE RIGHT  12/31/2017  . IR US GUIDE VASC ACCESS RIGHT  12/31/2017  . TUBAL LIGATION      Social History   Socioeconomic History  . Marital status: Married    Spouse name: Not on file  . Number of children: Not on file  . Years of education: Not on file  . Highest education level: Not on file  Social Needs  . Financial resource strain: Not on file  . Food insecurity - worry: Not on file  . Food insecurity - inability: Not on file  . Transportation needs - medical: Not on file  . Transportation needs - non-medical: Not on file  Occupational History  .  Not on file  Tobacco Use  . Smoking status: Former Smoker    Last attempt to quit: 03/22/1995    Years since quitting: 22.8  . Smokeless tobacco: Never Used  Substance and Sexual Activity  . Alcohol use: No  . Drug use: No  . Sexual activity: No    Birth control/protection: None  Other Topics Concern  . Not on file  Social History Narrative  . Not on file    Family History  Problem Relation Age of Onset  . Diabetes Mellitus II Mother   . Hypertension Mother   . Hyperlipidemia Mother   . Kidney disease Mother   . Heart disease Father   . Heart attack Father 77  . Congestive Heart Failure Brother   . Congestive Heart Failure Son     Current Outpatient Medications  Medication Sig Dispense Refill  . amLODipine (NORVASC) 10 MG tablet Take 1 tablet (10 mg total) by mouth daily. 30 tablet 1  . calcitRIOL (ROCALTROL) 0.25 MCG capsule Take 0.25 capsules by mouth daily.  0  . dextromethorphan (DELSYM) 30 MG/5ML liquid Take 2.5 mLs (15 mg total) by mouth 2 (two) times daily. 89 mL 0  . famotidine (PEPCID) 20 MG tablet Take 1 tablet (20 mg total) by mouth daily. 30 tablet 0  . hydrALAZINE (APRESOLINE) 50 MG tablet Take 1 tablet (50 mg total) by mouth 3 (three) times  daily. 270 tablet 3  . hydrOXYzine (ATARAX/VISTARIL) 25 MG tablet Take 1 tablet (25 mg total) by mouth every 8 (eight) hours as needed for itching. 30 tablet 0  . labetalol (NORMODYNE) 200 MG tablet Take 1 tablet (200 mg total) by mouth 2 (two) times daily. 60 tablet 1  . ondansetron (ZOFRAN-ODT) 4 MG disintegrating tablet Take 1 tablet (4 mg total) by mouth every 6 (six) hours as needed for nausea or vomiting. 30 tablet 1   No current facility-administered medications for this visit.      Allergies  Allergen Reactions  . Compazine [Prochlorperazine Edisylate] Swelling  . Prochlorperazine Swelling    Tongue swelling   . Vioxx [Rofecoxib] Other (See Comments)    Other reaction(s): Bleeding (intolerance) Bleeding     REVIEW OF SYSTEMS (negative unless checked):   Cardiac:  []  Chest pain or chest pressure? []  Shortness of breath upon activity? []  Shortness of breath when lying flat? []  Irregular heart rhythm?  Vascular:  []  Pain in calf, thigh, or hip brought on by walking? []  Pain in feet at night that wakes you up from your sleep? []  Blood clot in your veins? []  Leg swelling?  Pulmonary:  []  Oxygen at home? []  Productive cough? []  Wheezing?  Neurologic:  []  Sudden weakness in arms or legs? []  Sudden numbness in arms or legs? []  Sudden onset of difficult speaking or slurred speech? []  Temporary loss of vision in one eye? []  Problems with dizziness?  Gastrointestinal:  []  Blood in stool? []  Vomited blood?  Genitourinary:  []  Burning when urinating? []  Blood in urine? [x]   End stage renal disease-HD: T/R/S  Psychiatric:  []  Major depression  Hematologic:  []  Bleeding problems? []  Problems with blood clotting?  Dermatologic:  []  Rashes or ulcers?  Constitutional:  []  Fever or chills?  Ear/Nose/Throat:  []  Change in hearing? []  Nose bleeds? []  Sore throat?  Musculoskeletal:  []  Back pain? []  Joint pain? []  Muscle pain?   Physical Examination   Vitals:   01/21/18 1515  BP: (!) 151/74  Pulse: 87  Resp: 16  Temp: 98.1 F (36.7 C)  TempSrc: Oral  SpO2: 98%  Weight: 253 lb (114.8 kg)  Height: 5\' 4"  (1.626 m)    Pulmonary Sym exp, good air movt, CTAB, no rales, rhonchi, & wheezing  Cardiac RRR, Nl S1, S2, no Murmurs, rubs or gallops   right arm Incisions are healed, skin feels warm, hand grip is 5/5, sensation in digits is intact, no palpable thrill, bruit can be auscultated: high pitched wheezing sound    Medical Decision Making   Pamela Campbell is a 57 y.o. year old female who presents s/p right radiocephalic arteriovenous fistula in process of thrombosing   Doubted this R RC AVF would mature from post-op findings, hence, two week follow up.  I  recommend: ligation R RC AVF and placement of R BC AVF Risk, benefits, and alternatives to access surgery were discussed.   The patient is aware the risks include but are not limited to: bleeding, infection, steal syndrome, nerve damage, ischemic monomelic neuropathy, thrombosis, failure to mature, complications related to venous hypertension, need for additional procedures, death and stroke.   The patient agrees to proceed forward with the procedure.  Thank you for allowing [place] to participate in this patient's care.   Adele Barthel, MD, FACS [facility] [place] Office: [phone] Pager: [phone]

## 2018-01-17 NOTE — H&P (View-Only) (Signed)
Postoperative Access Visit   History of Present Illness   Pamela Campbell is a 57 y.o. year old female who presents for postoperative follow-up for: right radiocephalic arteriovenous fistula (Date: 01/04/18).  The patient's wounds are healed.  The patient notes no steal symptoms.  The patient is able to complete their activities of daily living.  The patient's current symptoms are: none.  Past Medical History:  Diagnosis Date  . Chronic diastolic CHF (congestive heart failure) ([place])    a. 03/2015: echo w/ EF of 50-55%, no WMA, Grade 2 DD, trivial AI, mild MR.  . Diabetes mellitus without complication ([place])   . Hypertension     Past Surgical History:  Procedure Laterality Date  . AV FISTULA PLACEMENT Right 01/04/2018   Procedure: ARTERIOVENOUS (AV) FISTULA CREATION RIGHT ARM;  Surgeon: Conrad [place], MD;  Location: [facility];  Service: Vascular;  Laterality: Right;  . CARDIAC CATHETERIZATION N/A 11/02/2016   Procedure: Left Heart Cath and Coronary Angiography;  Surgeon: Sherren Mocha, MD;  Location: [facility];  Service: Cardiovascular;  Laterality: N/A;  . CESAREAN SECTION    . FRACTURE SURGERY    . INSERTION OF DIALYSIS CATHETER Right 01/04/2018   Procedure: INSERTION OF DIALYSIS CATHETER RIGHT INTERNAL JUGULAR;  Surgeon: Conrad [place], MD;  Location: [facility];  Service: Vascular;  Laterality: Right;  . IR FLUORO GUIDE CV LINE RIGHT  12/31/2017  . IR US GUIDE VASC ACCESS RIGHT  12/31/2017  . TUBAL LIGATION      Social History   Socioeconomic History  . Marital status: Married    Spouse name: Not on file  . Number of children: Not on file  . Years of education: Not on file  . Highest education level: Not on file  Social Needs  . Financial resource strain: Not on file  . Food insecurity - worry: Not on file  . Food insecurity - inability: Not on file  . Transportation needs - medical: Not on file  . Transportation needs - non-medical: Not on file  Occupational History  .  Not on file  Tobacco Use  . Smoking status: Former Smoker    Last attempt to quit: 03/22/1995    Years since quitting: 22.8  . Smokeless tobacco: Never Used  Substance and Sexual Activity  . Alcohol use: No  . Drug use: No  . Sexual activity: No    Birth control/protection: None  Other Topics Concern  . Not on file  Social History Narrative  . Not on file    Family History  Problem Relation Age of Onset  . Diabetes Mellitus II Mother   . Hypertension Mother   . Hyperlipidemia Mother   . Kidney disease Mother   . Heart disease Father   . Heart attack Father 88  . Congestive Heart Failure Brother   . Congestive Heart Failure Son     Current Outpatient Medications  Medication Sig Dispense Refill  . amLODipine (NORVASC) 10 MG tablet Take 1 tablet (10 mg total) by mouth daily. 30 tablet 1  . calcitRIOL (ROCALTROL) 0.25 MCG capsule Take 0.25 capsules by mouth daily.  0  . dextromethorphan (DELSYM) 30 MG/5ML liquid Take 2.5 mLs (15 mg total) by mouth 2 (two) times daily. 89 mL 0  . famotidine (PEPCID) 20 MG tablet Take 1 tablet (20 mg total) by mouth daily. 30 tablet 0  . hydrALAZINE (APRESOLINE) 50 MG tablet Take 1 tablet (50 mg total) by mouth 3 (three) times  daily. 270 tablet 3  . hydrOXYzine (ATARAX/VISTARIL) 25 MG tablet Take 1 tablet (25 mg total) by mouth every 8 (eight) hours as needed for itching. 30 tablet 0  . labetalol (NORMODYNE) 200 MG tablet Take 1 tablet (200 mg total) by mouth 2 (two) times daily. 60 tablet 1  . ondansetron (ZOFRAN-ODT) 4 MG disintegrating tablet Take 1 tablet (4 mg total) by mouth every 6 (six) hours as needed for nausea or vomiting. 30 tablet 1   No current facility-administered medications for this visit.      Allergies  Allergen Reactions  . Compazine [Prochlorperazine Edisylate] Swelling  . Prochlorperazine Swelling    Tongue swelling   . Vioxx [Rofecoxib] Other (See Comments)    Other reaction(s): Bleeding (intolerance) Bleeding     REVIEW OF SYSTEMS (negative unless checked):   Cardiac:  []  Chest pain or chest pressure? []  Shortness of breath upon activity? []  Shortness of breath when lying flat? []  Irregular heart rhythm?  Vascular:  []  Pain in calf, thigh, or hip brought on by walking? []  Pain in feet at night that wakes you up from your sleep? []  Blood clot in your veins? []  Leg swelling?  Pulmonary:  []  Oxygen at home? []  Productive cough? []  Wheezing?  Neurologic:  []  Sudden weakness in arms or legs? []  Sudden numbness in arms or legs? []  Sudden onset of difficult speaking or slurred speech? []  Temporary loss of vision in one eye? []  Problems with dizziness?  Gastrointestinal:  []  Blood in stool? []  Vomited blood?  Genitourinary:  []  Burning when urinating? []  Blood in urine? [x]   End stage renal disease-HD: T/R/S  Psychiatric:  []  Major depression  Hematologic:  []  Bleeding problems? []  Problems with blood clotting?  Dermatologic:  []  Rashes or ulcers?  Constitutional:  []  Fever or chills?  Ear/Nose/Throat:  []  Change in hearing? []  Nose bleeds? []  Sore throat?  Musculoskeletal:  []  Back pain? []  Joint pain? []  Muscle pain?   Physical Examination   Vitals:   01/21/18 1515  BP: (!) 151/74  Pulse: 87  Resp: 16  Temp: 98.1 F (36.7 C)  TempSrc: Oral  SpO2: 98%  Weight: 253 lb (114.8 kg)  Height: 5\' 4"  (1.626 m)    Pulmonary Sym exp, good air movt, CTAB, no rales, rhonchi, & wheezing  Cardiac RRR, Nl S1, S2, no Murmurs, rubs or gallops   right arm Incisions are healed, skin feels warm, hand grip is 5/5, sensation in digits is intact, no palpable thrill, bruit can be auscultated: high pitched wheezing sound    Medical Decision Making   Pamela Campbell is a 57 y.o. year old female who presents s/p right radiocephalic arteriovenous fistula in process of thrombosing   Doubted this R RC AVF would mature from post-op findings, hence, two week follow up.  I  recommend: ligation R RC AVF and placement of R BC AVF Risk, benefits, and alternatives to access surgery were discussed.   The patient is aware the risks include but are not limited to: bleeding, infection, steal syndrome, nerve damage, ischemic monomelic neuropathy, thrombosis, failure to mature, complications related to venous hypertension, need for additional procedures, death and stroke.   The patient agrees to proceed forward with the procedure.  Thank you for allowing [place] to participate in this patient's care.   Adele Barthel, MD, FACS [facility] [place] Office: [phone] Pager: [phone]

## 2018-01-21 ENCOUNTER — Telehealth: Payer: Self-pay | Admitting: *Deleted

## 2018-01-21 ENCOUNTER — Ambulatory Visit (INDEPENDENT_AMBULATORY_CARE_PROVIDER_SITE_OTHER): Payer: Managed Care, Other (non HMO) | Admitting: Vascular Surgery

## 2018-01-21 ENCOUNTER — Other Ambulatory Visit: Payer: Self-pay | Admitting: *Deleted

## 2018-01-21 ENCOUNTER — Encounter: Payer: Self-pay | Admitting: *Deleted

## 2018-01-21 ENCOUNTER — Encounter: Payer: Self-pay | Admitting: Vascular Surgery

## 2018-01-21 VITALS — BP 151/74 | HR 87 | Temp 98.1°F | Resp 16 | Ht 64.0 in | Wt 253.0 lb

## 2018-01-21 DIAGNOSIS — N186 End stage renal disease: Secondary | ICD-10-CM

## 2018-01-21 DIAGNOSIS — Z992 Dependence on renal dialysis: Secondary | ICD-10-CM

## 2018-01-21 NOTE — Telephone Encounter (Signed)
Spoke with patient and confirmed arrival to [facility] admitting at 8:45 am 2.4.19.

## 2018-01-21 NOTE — Progress Notes (Signed)
Called and left message for patient to be at [facility] hospital admitting department at 8:45 am for surgery. All other instructions unchanged. Asked patient to call back to confirm.

## 2018-01-22 NOTE — Progress Notes (Signed)
Give pre- op instructions to daughter as patient's mailbox is full. Provided my number so that she can contact me if she has questions.

## 2018-01-24 ENCOUNTER — Telehealth: Payer: Self-pay | Admitting: Vascular Surgery

## 2018-01-24 ENCOUNTER — Other Ambulatory Visit: Payer: Self-pay

## 2018-01-24 ENCOUNTER — Ambulatory Visit (HOSPITAL_COMMUNITY): Payer: Managed Care, Other (non HMO) | Admitting: Certified Registered Nurse Anesthetist

## 2018-01-24 ENCOUNTER — Encounter (HOSPITAL_COMMUNITY): Payer: Self-pay | Admitting: *Deleted

## 2018-01-24 ENCOUNTER — Other Ambulatory Visit: Payer: Self-pay | Admitting: *Deleted

## 2018-01-24 ENCOUNTER — Ambulatory Visit (HOSPITAL_COMMUNITY)
Admission: RE | Admit: 2018-01-24 | Discharge: 2018-01-24 | Disposition: A | Discharge status: Home / Self Care | Payer: Managed Care, Other (non HMO) | Source: Ambulatory Visit | Attending: Vascular Surgery | Admitting: Vascular Surgery

## 2018-01-24 ENCOUNTER — Encounter: Payer: Self-pay | Admitting: Vascular Surgery

## 2018-01-24 ENCOUNTER — Encounter (HOSPITAL_COMMUNITY)
Admission: RE | Disposition: A | Discharge status: Home / Self Care | Payer: Self-pay | Source: Ambulatory Visit | Attending: Vascular Surgery

## 2018-01-24 DIAGNOSIS — Z6841 Body Mass Index (BMI) 40.0 and over, adult: Secondary | ICD-10-CM | POA: Insufficient documentation

## 2018-01-24 DIAGNOSIS — T82868A Thrombosis of vascular prosthetic devices, implants and grafts, initial encounter: Secondary | ICD-10-CM | POA: Insufficient documentation

## 2018-01-24 DIAGNOSIS — I132 Hypertensive heart and chronic kidney disease with heart failure and with stage 5 chronic kidney disease, or end stage renal disease: Secondary | ICD-10-CM | POA: Insufficient documentation

## 2018-01-24 DIAGNOSIS — Y832 Surgical operation with anastomosis, bypass or graft as the cause of abnormal reaction of the patient, or of later complication, without mention of misadventure at the time of the procedure: Secondary | ICD-10-CM | POA: Insufficient documentation

## 2018-01-24 DIAGNOSIS — N186 End stage renal disease: Secondary | ICD-10-CM | POA: Diagnosis not present

## 2018-01-24 DIAGNOSIS — Z87891 Personal history of nicotine dependence: Secondary | ICD-10-CM | POA: Insufficient documentation

## 2018-01-24 DIAGNOSIS — I5032 Chronic diastolic (congestive) heart failure: Secondary | ICD-10-CM | POA: Insufficient documentation

## 2018-01-24 DIAGNOSIS — T82898A Other specified complication of vascular prosthetic devices, implants and grafts, initial encounter: Secondary | ICD-10-CM | POA: Diagnosis not present

## 2018-01-24 DIAGNOSIS — E1122 Type 2 diabetes mellitus with diabetic chronic kidney disease: Secondary | ICD-10-CM | POA: Diagnosis not present

## 2018-01-24 DIAGNOSIS — N185 Chronic kidney disease, stage 5: Secondary | ICD-10-CM | POA: Diagnosis not present

## 2018-01-24 HISTORY — PX: LIGATION OF ARTERIOVENOUS  FISTULA: SHX5948

## 2018-01-24 HISTORY — PX: AV FISTULA PLACEMENT: SHX1204

## 2018-01-24 HISTORY — DX: Chronic kidney disease, stage 5: N18.5

## 2018-01-24 LAB — GLUCOSE, CAPILLARY
GLUCOSE-CAPILLARY: 142 mg/dL — AB (ref 65–99)
Glucose-Capillary: 160 mg/dL — ABNORMAL HIGH (ref 65–99)

## 2018-01-24 LAB — POCT I-STAT 4, (NA,K, GLUC, HGB,HCT)
GLUCOSE: 164 mg/dL — AB (ref 65–99)
Glucose, Bld: 160 mg/dL — ABNORMAL HIGH (ref 65–99)
HCT: 27 % — ABNORMAL LOW (ref 36.0–46.0)
HEMATOCRIT: 27 % — AB (ref 36.0–46.0)
HEMOGLOBIN: 9.2 g/dL — AB (ref 12.0–15.0)
Hemoglobin: 9.2 g/dL — ABNORMAL LOW (ref 12.0–15.0)
Potassium: 4.7 mmol/L (ref 3.5–5.1)
Potassium: 6.8 mmol/L (ref 3.5–5.1)
SODIUM: 142 mmol/L (ref 135–145)
Sodium: 144 mmol/L (ref 135–145)

## 2018-01-24 LAB — POTASSIUM: POTASSIUM: 4.9 mmol/L (ref 3.5–5.1)

## 2018-01-24 SURGERY — LIGATION OF ARTERIOVENOUS  FISTULA
Anesthesia: General | Site: Arm Lower | Laterality: Right

## 2018-01-24 MED ORDER — ONDANSETRON HCL 4 MG/2ML IJ SOLN: 4.0000 mg | Freq: Once | INTRAMUSCULAR | Status: DC | PRN

## 2018-01-24 MED ORDER — PROPOFOL 10 MG/ML IV BOLUS
INTRAVENOUS | Status: DC | PRN
  Administered 2018-01-24: 30 mg via INTRAVENOUS
  Administered 2018-01-24: 25 mg via INTRAVENOUS
  Administered 2018-01-24: 30 mg via INTRAVENOUS

## 2018-01-24 MED ORDER — FENTANYL CITRATE (PF) 250 MCG/5ML IJ SOLN
INTRAMUSCULAR | Status: AC
Start: 2018-01-24 — End: 2018-01-24
  Filled 2018-01-24: qty 5

## 2018-01-24 MED ORDER — CHLORHEXIDINE GLUCONATE 4 % EX LIQD: 60.0000 mL | Freq: Once | CUTANEOUS | Status: DC

## 2018-01-24 MED ORDER — ONDANSETRON HCL 4 MG/2ML IJ SOLN
INTRAMUSCULAR | Status: DC | PRN
  Administered 2018-01-24: 4 mg via INTRAVENOUS

## 2018-01-24 MED ORDER — MIDAZOLAM HCL 2 MG/2ML IJ SOLN
INTRAMUSCULAR | Status: AC
  Filled 2018-01-24: qty 2

## 2018-01-24 MED ORDER — FENTANYL CITRATE (PF) 100 MCG/2ML IJ SOLN
INTRAMUSCULAR | Status: AC
  Filled 2018-01-24: qty 2

## 2018-01-24 MED ORDER — PROPOFOL 10 MG/ML IV BOLUS
INTRAVENOUS | Status: AC
  Filled 2018-01-24: qty 20

## 2018-01-24 MED ORDER — 0.9 % SODIUM CHLORIDE (POUR BTL) OPTIME
TOPICAL | Status: DC | PRN
  Administered 2018-01-24: 1000 mL

## 2018-01-24 MED ORDER — OXYCODONE-ACETAMINOPHEN 5-325 MG PO TABS: 1.0000 | ORAL_TABLET | Freq: Four times a day (QID) | ORAL | 0 refills | Status: DC | PRN

## 2018-01-24 MED ORDER — LIDOCAINE 2% (20 MG/ML) 5 ML SYRINGE
INTRAMUSCULAR | Status: DC | PRN
  Administered 2018-01-24: 20 mg via INTRAVENOUS

## 2018-01-24 MED ORDER — SODIUM CHLORIDE 0.9 % IV SOLN
INTRAVENOUS | Status: DC | PRN
  Administered 2018-01-24: 12:00:00

## 2018-01-24 MED ORDER — DEXAMETHASONE SODIUM PHOSPHATE 10 MG/ML IJ SOLN
INTRAMUSCULAR | Status: DC | PRN
  Administered 2018-01-24: 5 mg via INTRAVENOUS

## 2018-01-24 MED ORDER — SODIUM CHLORIDE 0.9 % IV SOLN
INTRAVENOUS | Status: DC
  Administered 2018-01-24: 11:00:00 via INTRAVENOUS

## 2018-01-24 MED ORDER — FENTANYL CITRATE (PF) 100 MCG/2ML IJ SOLN
INTRAMUSCULAR | Status: DC | PRN
  Administered 2018-01-24 (×3): 50 ug via INTRAVENOUS

## 2018-01-24 MED ORDER — LIDOCAINE HCL (PF) 1 % IJ SOLN
INTRAMUSCULAR | Status: DC | PRN
  Administered 2018-01-24: 30 mL

## 2018-01-24 MED ORDER — LIDOCAINE HCL (PF) 1 % IJ SOLN
INTRAMUSCULAR | Status: AC
  Filled 2018-01-24: qty 30

## 2018-01-24 MED ORDER — FENTANYL CITRATE (PF) 100 MCG/2ML IJ SOLN
25.0000 ug | INTRAMUSCULAR | Status: DC | PRN
  Administered 2018-01-24 (×2): 25 ug via INTRAVENOUS

## 2018-01-24 MED ORDER — DEXTROSE 5 % IV SOLN
1.5000 g | INTRAVENOUS | Status: AC
  Administered 2018-01-24: 1.5 g via INTRAVENOUS
  Filled 2018-01-24: qty 1.5

## 2018-01-24 MED ORDER — PHENYLEPHRINE HCL 10 MG/ML IJ SOLN
INTRAVENOUS | Status: DC | PRN
  Administered 2018-01-24: 25 ug/min via INTRAVENOUS

## 2018-01-24 MED ORDER — PROPOFOL 500 MG/50ML IV EMUL
INTRAVENOUS | Status: DC | PRN
  Administered 2018-01-24: 75 ug/kg/min via INTRAVENOUS
  Administered 2018-01-24: 12:00:00 via INTRAVENOUS

## 2018-01-24 MED ORDER — MIDAZOLAM HCL 5 MG/5ML IJ SOLN
INTRAMUSCULAR | Status: DC | PRN
  Administered 2018-01-24: 2 mg via INTRAVENOUS

## 2018-01-24 SURGICAL SUPPLY — 32 items
ARMBAND PINK RESTRICT EXTREMIT (MISCELLANEOUS) ×4 IMPLANT
CANISTER SUCT 3000ML PPV (MISCELLANEOUS) ×2 IMPLANT
CLIP VESOCCLUDE MED 6/CT (CLIP) ×2 IMPLANT
CLIP VESOCCLUDE SM WIDE 6/CT (CLIP) ×2 IMPLANT
COVER PROBE W GEL 5X96 (DRAPES) ×2 IMPLANT
DECANTER SPIKE VIAL GLASS SM (MISCELLANEOUS) ×2 IMPLANT
DERMABOND ADVANCED (GAUZE/BANDAGES/DRESSINGS) ×1
DERMABOND ADVANCED .7 DNX12 (GAUZE/BANDAGES/DRESSINGS) ×1 IMPLANT
ELECT REM PT RETURN 9FT ADLT (ELECTROSURGICAL) ×2
ELECTRODE REM PT RTRN 9FT ADLT (ELECTROSURGICAL) ×1 IMPLANT
GAUZE SPONGE 4X4 12PLY STRL (GAUZE/BANDAGES/DRESSINGS) ×2 IMPLANT
GLOVE BIO SURGEON STRL SZ7 (GLOVE) ×2 IMPLANT
GLOVE BIOGEL PI IND STRL 7.5 (GLOVE) ×1 IMPLANT
GLOVE BIOGEL PI INDICATOR 7.5 (GLOVE) ×1
GOWN STRL REUS W/ TWL LRG LVL3 (GOWN DISPOSABLE) ×3 IMPLANT
GOWN STRL REUS W/TWL LRG LVL3 (GOWN DISPOSABLE) ×3
HEMOSTAT SPONGE AVITENE ULTRA (HEMOSTASIS) IMPLANT
KIT BASIN OR (CUSTOM PROCEDURE TRAY) ×2 IMPLANT
KIT ROOM TURNOVER OR (KITS) ×2 IMPLANT
NS IRRIG 1000ML POUR BTL (IV SOLUTION) ×2 IMPLANT
PACK CV ACCESS (CUSTOM PROCEDURE TRAY) ×2 IMPLANT
PAD ARMBOARD 7.5X6 YLW CONV (MISCELLANEOUS) ×4 IMPLANT
SUT ETHILON 3 0 PS 1 (SUTURE) ×2 IMPLANT
SUT MNCRL AB 4-0 PS2 18 (SUTURE) ×4 IMPLANT
SUT PROLENE 6 0 BV (SUTURE) ×2 IMPLANT
SUT PROLENE 7 0 BV 1 (SUTURE) ×2 IMPLANT
SUT SILK 0 TIES 10X30 (SUTURE) ×2 IMPLANT
SUT VIC AB 3-0 SH 27 (SUTURE) ×2
SUT VIC AB 3-0 SH 27X BRD (SUTURE) ×2 IMPLANT
TOWEL GREEN STERILE (TOWEL DISPOSABLE) ×2 IMPLANT
UNDERPAD 30X30 (UNDERPADS AND DIAPERS) ×2 IMPLANT
WATER STERILE IRR 1000ML POUR (IV SOLUTION) ×2 IMPLANT

## 2018-01-24 NOTE — Anesthesia Preprocedure Evaluation (Addendum)
Anesthesia Evaluation  Patient identified by MRN, date of birth, ID band Patient awake    Reviewed: Allergy & Precautions, H&P , NPO status , Patient's Chart, lab work & pertinent test results  Airway Mallampati: II  TM Distance: >3 FB Neck ROM: Full    Dental no notable dental hx. (+) Teeth Intact, Dental Advisory Given   Pulmonary former smoker,    Pulmonary exam normal breath sounds clear to auscultation       Cardiovascular Exercise Tolerance: Good hypertension, Pt. on medications and Pt. on home beta blockers +CHF   Rhythm:Regular Rate:Normal  ECG: SR, rate 98  ECHO: LV EF: 60% - 65%   Neuro/Psych negative neurological ROS  negative psych ROS   GI/Hepatic negative GI ROS, Neg liver ROS,   Endo/Other  diabetesMorbid obesity  Renal/GU ESRF and DialysisRenal diseaseLast HD Friday     Musculoskeletal   Abdominal (+) + obese,   Peds  Hematology negative hematology ROS (+)   Anesthesia Other Findings   Reproductive/Obstetrics                            Anesthesia Physical  Anesthesia Plan  ASA: IV  Anesthesia Plan: General   Post-op Pain Management:    Induction: Intravenous  PONV Risk Score and Plan: 3 and Ondansetron, Treatment may vary due to age or medical condition and Dexamethasone  Airway Management Planned: LMA  Additional Equipment:   Intra-op Plan:   Post-operative Plan: Extubation in OR  Informed Consent: I have reviewed the patients History and Physical, chart, labs and discussed the procedure including the risks, benefits and alternatives for the proposed anesthesia with the patient or authorized representative who has indicated his/her understanding and acceptance.   Dental advisory given  Plan Discussed with: CRNA  Anesthesia Plan Comments:        Anesthesia Quick Evaluation

## 2018-01-24 NOTE — Anesthesia Postprocedure Evaluation (Signed)
Anesthesia Post Note  Patient: Pamela Campbell  Procedure(s) Performed: LIGATION OF  RIGHT RADIOCEPHALIC ARTERIOVENOUS  FISTULA (Right Arm Lower) ARTERIOVENOUS (AV) FISTULA CREATION RIGHT BRACHIOCEPHALIC (Right Arm Lower)     Patient location during evaluation: PACU Anesthesia Type: MAC Level of consciousness: awake and alert Pain management: pain level controlled Vital Signs Assessment: post-procedure vital signs reviewed and stable Respiratory status: spontaneous breathing, nonlabored ventilation, respiratory function stable and patient connected to nasal cannula oxygen Cardiovascular status: stable and blood pressure returned to baseline Postop Assessment: no apparent nausea or vomiting Anesthetic complications: no    Last Vitals:  Vitals:   01/24/18 1448 01/24/18 1515  BP:  (!) 149/71  Pulse: 71 74  Resp: (!) 22 (!) 21  Temp:    SpO2: 96% 98%    Last Pain:  Vitals:   01/24/18 1430  TempSrc:   PainSc: Asleep                 Ryan P Ellender

## 2018-01-24 NOTE — Consult Note (Signed)
   [facility] CM Inpatient Consult   01/24/2018  Pamela Campbell Aug 26, 1961 [id number]     Made aware of hospitalization by [place] Management Telephonic RNCM. Patient hospitalized for planned surgery for HD access. However, patient is in [place] area. Not admitted to floor at this time.   Will engage if patient is admitted to floor.   [name], MSN-Ed, RN,BSN [facility] - Inpatient Liaison [phone]

## 2018-01-24 NOTE — Transfer of Care (Signed)
Immediate Anesthesia Transfer of Care Note  Patient: Pamela Campbell  Procedure(s) Performed: LIGATION OF  RIGHT RADIOCEPHALIC ARTERIOVENOUS  FISTULA (Right Arm Lower) ARTERIOVENOUS (AV) FISTULA CREATION RIGHT BRACHIOCEPHALIC (Right Arm Lower)  Patient Location: PACU  Anesthesia Type:MAC  Level of Consciousness: drowsy  Airway & Oxygen Therapy: Patient Spontanous Breathing and Patient connected to face mask oxygen  Post-op Assessment: Report given to RN, Post -op Vital signs reviewed and stable and Patient moving all extremities X 4  Post vital signs: Reviewed and stable  Last Vitals:  Vitals:   01/24/18 0859  BP: (!) 179/77  Pulse: 78  Resp: 18  Temp: 36.6 C  SpO2: 100%    Last Pain:  Vitals:   01/24/18 0859  TempSrc: Oral      Patients Stated Pain Goal: 2 (19/59/74 7185)  Complications: No apparent anesthesia complications

## 2018-01-24 NOTE — Op Note (Signed)
OPERATIVE NOTE   PROCEDURE: 1. right brachiocephalic arteriovenous fistula placement 2. Ligation of right radiocephalic arteriovenous fistula    PRE-OPERATIVE DIAGNOSIS: end stage renal disease   POST-OPERATIVE DIAGNOSIS: same as above   SURGEON: Adele Barthel, MD  ASSISTANT(S): Gerri Lins, PAC   ANESTHESIA: local and MAC  ESTIMATED BLOOD LOSS: 50 cc  FINDING(S): 1.  Cephalic vein: 5-3.2 mm, acceptable 2.  Brachial artery: 3 mm, disease free 3.  Venous outflow: palpable thrill  4.  Radial flow: dopplerable radial signal  SPECIMEN(S):  none  INDICATIONS:   Pamela Campbell is a 57 y.o. female who presents with failing right radiocephalic arteriovenous fistula.  The patient is scheduled for right radiocephalic arteriovenous fistula ligation and brachiocephalic arteriovenous fistula placement.  The patient is aware the risks include but are not limited to: bleeding, infection, steal syndrome, nerve damage, ischemic monomelic neuropathy, failure to mature, and need for additional procedures.  The patient is aware of the risks of the procedure and elects to proceed forward.   DESCRIPTION: After full informed written consent was obtained from the patient, the patient was brought back to the operating room and placed supine upon the operating table.  Prior to induction, the patient received IV antibiotics.   After obtaining adequate anesthesia, the patient was then prepped and draped in the standard fashion for a right arm access procedure.  I turned my attention first to identifying the patient's cephalic vein and brachial artery.  Using SonoSite guidance, the location of these vessels were marked out on the skin.     At this point, I injected local anesthetic to obtain a field block of the antecubitum.  I also injection local anesthesia over the previous radial artery exposure incision.  In total, I injected about 10 mL of 1% lidocaine without epinephrine.    I made an incision  over the prior radial artery exposure.  I dissected out the prior fistula.  I placed two 2-0 silk ties around the distal fistula and tied off the fistula and transected it.  There was no further thrill in this fistula.  I repaired this incision with a layer of 3-0 Vicryl and a running subcuticular of 4-0 Monocryl.  The skin was cleaned, dried, and reinforced with Dermabond.   I made a longitudinal incision at the level of the antecubitum and dissected through the subcutaneous tissue and fascia to gain exposure of the brachial artery.  This was noted to be 3 mm in diameter externally.  This was dissected out proximally and distally and controlled with vessel loops .  I then dissected out the cephalic vein.  This was noted to be 4-4.5 mm in diameter externally.  The distal segment of the vein was ligated with a  2-0 silk, and the vein was transected.  The proximal segment was interrogated with serial dilators.  The vein accepted up to a 5 mm dilator without any difficulty.  I then instilled the heparinized saline into the vein and clamped it.  At this point, I reset my exposure of the brachial artery and placed the artery under tension proximally and distally.  I made an arteriotomy with a #11 blade, and then I extended the arteriotomy with a Potts scissor.  I injected heparinized saline proximal and distal to this arteriotomy.  The vein was then sewn to the artery in an end-to-side configuration with a running stitch of 6-0 Prolene.  Prior to completing this anastomosis, I allowed the vein and artery to backbleed.  There was no evidence of clot from any vessels.  I completed the anastomosis in the usual fashion and then released all vessel loops and clamps.    There was a palpable thrill in the venous outflow, and there was a dopplerable radial signal.  At this point, I irrigated out the surgical wound.  There was no further active bleeding.  The subcutaneous tissue was reapproximated with a running stitch of  3-0 Vicryl.  The skin was then reapproximated with a running subcuticular stitch of 4-0 Vicryl.  The skin was then cleaned, dried, and reinforced with Dermabond.  The patient tolerated this procedure well.    COMPLICATIONS: none  CONDITION: stable   Adele Barthel, MD, [facility] [place] Office: [phone] Pager: [phone]  01/24/2018, 12:38 PM

## 2018-01-24 NOTE — Patient Outreach (Signed)
[facility]) Care Management  01/24/2018  Pamela Campbell [birth year] [id number]   Patient is admitted inpatient to [facility]. In basket message sent to [place], Liaison about patient's admission status.   Plan: RN CM will follow-up with patient and hospital Liaison after patient is discharged from the hospital.  [name], RN, BSN, MHA/MSL, [place] Direct Phone: [phone] Cell Phone: [phone] Toll Free: [phone] Fax: [phone]

## 2018-01-24 NOTE — Interval H&P Note (Signed)
History and Physical Interval Note:  01/24/2018 10:15 AM  Pamela Campbell  has presented today for surgery, with the diagnosis of END STAGE RENAL DISEASE FOR HEMODIALYSIS ACCESS  The various methods of treatment have been discussed with the patient and family. After consideration of risks, benefits and other options for treatment, the patient has consented to  Procedure(s): LIGATION OF  RIGHT RADIOCEPHALIC ARTERIOVENOUS  FISTULA (Right) ARTERIOVENOUS (AV) FISTULA CREATION RIGHT BRACHIOCEPHALIC (Right) as a surgical intervention .  The patient's history has been reviewed, patient examined, no change in status, stable for surgery.  I have reviewed the patient's chart and labs.  Questions were answered to the patient's satisfaction.     Adele Barthel

## 2018-01-24 NOTE — Telephone Encounter (Signed)
Sched appt 03/09/18 at 1:00. Lm on hm# to inform pt of appt.

## 2018-01-24 NOTE — Telephone Encounter (Signed)
-----   Message from Mena Goes, RN sent at 01/24/2018  1:24 PM EST ----- Regarding: 6 weeks [facility] clinic   ----- Message ----- From: Conrad [place], MD Sent: 01/24/2018  12:43 PM To: [facility] Charge [address]  Pamela Campbell [id number] [birth year]  PROCEDURE: 1. right brachiocephalic arteriovenous fistula placement 2. Ligation of right radiocephalic arteriovenous fistula   Asst: Gerri Lins, [facility]   Follow-up: 6 week in [place] clinic

## 2018-01-24 NOTE — Anesthesia Procedure Notes (Signed)
Procedure Name: MAC Date/Time: 01/24/2018 11:20 AM Performed by: Colin Benton, CRNA Pre-anesthesia Checklist: Patient identified, Emergency Drugs available, Suction available and Patient being monitored Patient Re-evaluated:Patient Re-evaluated prior to induction Oxygen Delivery Method: Simple face mask Induction Type: IV induction Placement Confirmation: positive ETCO2

## 2018-01-25 ENCOUNTER — Ambulatory Visit: Payer: Self-pay | Admitting: *Deleted

## 2018-01-25 ENCOUNTER — Encounter (HOSPITAL_COMMUNITY): Payer: Self-pay | Admitting: Vascular Surgery

## 2018-01-25 ENCOUNTER — Other Ambulatory Visit: Payer: Self-pay | Admitting: *Deleted

## 2018-01-25 NOTE — Consult Note (Signed)
[facility] Care Management follow up.  Chart reviewed. Appears patient discharged after surgery yesterday. Did not admit.   Notification sent to [place].  [name], MSN-Ed, RN,BSN [facility] Liaison [phone]

## 2018-01-25 NOTE — Patient Outreach (Addendum)
[place] [facility] [address]) Care Management  01/25/2018  Pamela Campbell April 09, 1961 [id number]  EMMI- Heart Failure RED ON EMMI ALERT DAY#: 4 DATE: 01/10/18 RED ALERT: New/worsening problems? Yes  Outgoing telephone call to patient. HIPAA identifiers X's 2 verified. Patient confirmed having new/worsening problems. She reported that her fistula failed. Patient had a fistula replaced on 01/24/18 at [facility]. She has a Diatec Catheter, as a temporary catheter for hemodialysis, per patient. Patient stated, she gained 20 pounds due to having no access for hemodialysis. Patient reported, her last weight was 240 pounds. She doesn't remember her last dry weight after hemodialysis today. Patient dialysis site was switched to [facility]. She had to temporarily change her dialysis days to Tuesday, Thursday, and Saturday.   Patient reported, she received, read, and understood her discharge hospital instructions. She verbalized being able to afford and taking her medications as prescribed. She monitors her weight daily, in addition to her weight being monitored during hemodialysis. She continues to drive. Patient stated, she works 2nd shift and sometimes she can't answer the EMMI telephone calls. RN CM encouraged patient to answer questions when she is capable. Advised patient that she may continue to get automated post discharge calls to assess how she is doing following recent hospitalization. Inform patient, she will receive a call from a nurse if any of her responses were abnormal. Patient voiced understanding and was appreciative of follow-up call.  Plan: RN CM will contact patient within one week.  RN CM will send patient EMMI educational materials. ] RN CM will notify [facility] Case Management Assistant regarding case closure.   [name], RN, BSN, MHA/MSL, [place] Telephonic Care Manager Coordinator [facility] Direct Phone: [phone] Cell Phone: [phone] Toll Free:  [phone] Fax: [phone]

## 2018-01-26 ENCOUNTER — Encounter: Payer: Self-pay | Admitting: *Deleted

## 2018-01-27 ENCOUNTER — Other Ambulatory Visit: Payer: Self-pay | Admitting: *Deleted

## 2018-01-27 NOTE — Patient Outreach (Signed)
[place] [facility]) Care Management  01/27/2018  Pamela Campbell 04/24/1961 [id number]   EMMI- Heart Failure RED ON EMMI ALERT DAY#: 20 DATE:01/26/18 RED ALERT: Weighed themselves today? No   Outreach attempt #1 to patient. No answer. RN CM left HIPAA compliant message along with contact info.     Plan: RN CM will contact patient within one week.     [name], RN, BSN, MHA/MSL, [place] Telephonic Care Manager Coordinator [facility] Direct Phone: [phone] Toll Free: [phone] Fax: [phone]

## 2018-01-28 ENCOUNTER — Other Ambulatory Visit: Payer: Self-pay | Admitting: *Deleted

## 2018-01-28 NOTE — Patient Outreach (Signed)
[place] [facility]) Care Management  01/28/2018  AMRY CATHY [birth year] [id number]   EMMI- Heart Failure RED ON EMMI ALERT DAY#: 20 DATE:01/26/18 RED ALERT: Weighed themselves today? No   Outreach attempt #2 to patient. No answer. RN CM left HIPAA compliant message along with contact info.    Plan: RN CM will send unsuccessful outreach letter to patient and will close case if no response from patient within 10 business days.   [name], RN, BSN, MHA/MSL, [place] Telephonic Care Manager Coordinator [facility] Direct Phone: [phone] Toll Free: [phone] Fax: [phone]

## 2018-01-31 ENCOUNTER — Encounter: Payer: Self-pay | Admitting: *Deleted

## 2018-02-10 ENCOUNTER — Other Ambulatory Visit: Payer: Self-pay | Admitting: *Deleted

## 2018-02-10 NOTE — Patient Outreach (Signed)
[place] [facility]) Care Management  02/10/2018  MONASIA LAIR 08-22-1961 [id number]   EMMI- Heart Failure RED ON EMMI ALERT DAY#: 20 DATE:01/26/18 RED ALERT: Weighed themselves today? No  Outreach attempt #3 to patient. No answer. RN CM left HIPAA compliant message along with contact info.  RN CM sent unsuccessful outreach letter to patient on 01/31/18. RN CM will close case due to no response from patient within 10 business days.  Plan:  RN CM will notify [facility] Case Management Assistant regarding case closure.   [name], RN, BSN, MHA/MSL, [place] Telephonic Care Manager Coordinator [facility] Direct Phone: [phone] Cell Phone: [phone] Toll Free: [phone] Fax: [phone]

## 2018-02-16 ENCOUNTER — Encounter: Payer: Managed Care, Other (non HMO) | Admitting: Vascular Surgery

## 2018-03-01 ENCOUNTER — Encounter (INDEPENDENT_AMBULATORY_CARE_PROVIDER_SITE_OTHER): Payer: Managed Care, Other (non HMO)

## 2018-03-01 DIAGNOSIS — Z0279 Encounter for issue of other medical certificate: Secondary | ICD-10-CM

## 2018-03-11 ENCOUNTER — Other Ambulatory Visit: Payer: Self-pay | Admitting: Family Medicine

## 2018-03-11 DIAGNOSIS — Z1231 Encounter for screening mammogram for malignant neoplasm of breast: Secondary | ICD-10-CM

## 2018-03-23 ENCOUNTER — Other Ambulatory Visit: Payer: Self-pay | Admitting: *Deleted

## 2018-03-23 ENCOUNTER — Encounter: Payer: Self-pay | Admitting: Physician Assistant

## 2018-03-23 ENCOUNTER — Other Ambulatory Visit: Payer: Self-pay

## 2018-03-23 ENCOUNTER — Ambulatory Visit (INDEPENDENT_AMBULATORY_CARE_PROVIDER_SITE_OTHER): Payer: Self-pay | Admitting: Physician Assistant

## 2018-03-23 ENCOUNTER — Encounter: Payer: Self-pay | Admitting: *Deleted

## 2018-03-23 VITALS — BP 166/84 | HR 85 | Temp 97.8°F | Resp 16 | Ht 64.0 in | Wt 258.0 lb

## 2018-03-23 DIAGNOSIS — Z992 Dependence on renal dialysis: Secondary | ICD-10-CM

## 2018-03-23 DIAGNOSIS — E6609 Other obesity due to excess calories: Secondary | ICD-10-CM

## 2018-03-23 DIAGNOSIS — N186 End stage renal disease: Secondary | ICD-10-CM

## 2018-03-23 NOTE — Progress Notes (Signed)
Established Dialysis Access   History of Present Illness   Pamela Campbell is a 57 y.o. (1961-07-04) female who presents for re-evaluation for permanent access.  She is status post ligation of right radiocephalic arteriovenous fistula and creation of right brachiocephalic AV fistula by Dr. Bridgett Larsson on 01/24/18.  Patient states incisions of right wrist and right AC fossa are well-healed and she did not experience any drainage or signs of infection.  She also denies any signs or symptoms of steal syndrome in her right hand.  She is currently dialyzing via right IJ tunneled dialysis catheter on a Tuesday Thursday Saturday schedule.    The patient's PMH, PSH, SH, and FamHx were reviewed on are unchanged from prior office visit.  Current Outpatient Medications  Medication Sig Dispense Refill  . amLODipine (NORVASC) 10 MG tablet Take 1 tablet (10 mg total) by mouth daily. 30 tablet 1  . b complex-vitamin c-folic acid (NEPHRO-VITE) 0.8 MG TABS tablet Take 1 tablet by mouth at bedtime.    . ferric citrate (AURYXIA) 1 GM 210 MG(Fe) tablet Take 420 mg by mouth 3 (three) times daily with meals.     No current facility-administered medications for this visit.     On ROS today: 10 system ROS negative unless otherwise noted in HPI.   Physical Examination   Vitals:   03/23/18 1330  BP: (!) 166/84  Pulse: 85  Resp: 16  Temp: 97.8 F (36.6 C)  TempSrc: Oral  SpO2: 95%  Weight: 258 lb (117 kg)  Height: 5\' 4"  (1.626 m)   Body mass index is 44.29 kg/m.  General Alert, O x 3, Obese, NAD  Pulmonary Sym exp, good B air movt, CTA B    Cardiac No rubs, No S3,S4, RRR  Vascular Vessel Right Left  Radial Palpable Palpable  Brachial Palpable Palpable  Ulnar Palpable Palpable    Musculo- skeletal M/S 5/5 throughout  , Extremities without ischemic changes; palpable right radial pulse, palpable thrill to mid right upper arm; audible bruit to auscultation throughout right upper arm    Neurologic Pain  and light touch intact in extremities , Motor exam as listed above     Non-invasive Vascular Imaging    Diameter of cephalic vein in right upper arm greater than 0.6 cm throughout most of upper arm  Fistula less than 0.6 cm from skin surface for a 1-2 cm portion near [facility] fossa; fistula is greater than 1 cm from the surface of the skin throughout the remainder of right upper arm   Outside Studies/Documentation    pages of outside documents were reviewed including: .   Medical Decision Making   Pamela Campbell is a 57 y.o. female who presents with ESRD requiring hemodialysis.    Right brachiocephalic fistula maturing well however is greater than 1 cm from the surface of the skin throughout most of the right upper arm by ultrasound  Based on these findings I have recommended super fistulization of right arm arteriovenous fistula  I had an extensive discussion with this patient in regards to the nature of access surgery, including risk, benefits, and alternatives.    The patient is aware that the risks of access surgery include but are not limited to: bleeding, infection, steal syndrome, nerve damage, ischemic monomelic neuropathy, failure of access to mature, and possible need for additional access procedures in the future.  The patient has  agreed to proceed with the above procedure which will be scheduled in late April early May  due to patient's busy schedule with appointments in the early part of this month.   Dagoberto Ligas, PA-C [facility] [place] Office: [phone]

## 2018-04-06 ENCOUNTER — Ambulatory Visit
Admission: RE | Admit: 2018-04-06 | Discharge: 2018-04-06 | Disposition: A | Discharge status: Home / Self Care | Payer: Managed Care, Other (non HMO) | Source: Ambulatory Visit | Attending: Family Medicine | Admitting: Family Medicine

## 2018-04-06 DIAGNOSIS — Z1231 Encounter for screening mammogram for malignant neoplasm of breast: Secondary | ICD-10-CM

## 2018-04-29 MED ORDER — DEXTROSE 5 % IV SOLN
3.0000 g | INTRAVENOUS | Status: AC
  Administered 2018-05-02: 3 g via INTRAVENOUS
  Filled 2018-04-29: qty 3

## 2018-05-01 NOTE — Anesthesia Preprocedure Evaluation (Addendum)
Anesthesia Evaluation  Patient identified by MRN, date of birth, ID band Patient awake    Reviewed: Allergy & Precautions, H&P , NPO status , Patient's Chart, lab work & pertinent test results  Airway Mallampati: II  TM Distance: >3 FB Neck ROM: Full    Dental no notable dental hx. (+) Teeth Intact, Dental Advisory Given   Pulmonary neg pulmonary ROS, former smoker,    Pulmonary exam normal breath sounds clear to auscultation       Cardiovascular Exercise Tolerance: Good hypertension, +CHF   Rhythm:Regular Rate:Normal     Neuro/Psych negative neurological ROS  negative psych ROS   GI/Hepatic negative GI ROS, Neg liver ROS,   Endo/Other  diabetesMorbid obesity  Renal/GU ESRF and DialysisRenal disease  negative genitourinary   Musculoskeletal   Abdominal   Peds  Hematology negative hematology ROS (+)   Anesthesia Other Findings   Reproductive/Obstetrics negative OB ROS                            Anesthesia Physical Anesthesia Plan  ASA: III  Anesthesia Plan: MAC   Post-op Pain Management:    Induction: Intravenous  PONV Risk Score and Plan: 3 and Ondansetron, Propofol infusion and Midazolam  Airway Management Planned: Simple Face Mask  Additional Equipment:   Intra-op Plan:   Post-operative Plan:   Informed Consent: I have reviewed the patients History and Physical, chart, labs and discussed the procedure including the risks, benefits and alternatives for the proposed anesthesia with the patient or authorized representative who has indicated his/her understanding and acceptance.   Dental advisory given  Plan Discussed with: CRNA  Anesthesia Plan Comments:         Anesthesia Quick Evaluation

## 2018-05-02 ENCOUNTER — Encounter (HOSPITAL_COMMUNITY)
Admission: RE | Disposition: A | Discharge status: Home / Self Care | Payer: Self-pay | Source: Ambulatory Visit | Attending: Vascular Surgery

## 2018-05-02 ENCOUNTER — Ambulatory Visit (HOSPITAL_COMMUNITY)
Admission: RE | Admit: 2018-05-02 | Discharge: 2018-05-02 | Disposition: A | Discharge status: Home / Self Care | Payer: Managed Care, Other (non HMO) | Source: Ambulatory Visit | Attending: Vascular Surgery | Admitting: Vascular Surgery

## 2018-05-02 ENCOUNTER — Ambulatory Visit (HOSPITAL_COMMUNITY): Payer: Managed Care, Other (non HMO) | Admitting: Anesthesiology

## 2018-05-02 ENCOUNTER — Telehealth: Payer: Self-pay | Admitting: Vascular Surgery

## 2018-05-02 DIAGNOSIS — N186 End stage renal disease: Secondary | ICD-10-CM | POA: Diagnosis not present

## 2018-05-02 DIAGNOSIS — Z888 Allergy status to other drugs, medicaments and biological substances status: Secondary | ICD-10-CM | POA: Diagnosis not present

## 2018-05-02 DIAGNOSIS — I5032 Chronic diastolic (congestive) heart failure: Secondary | ICD-10-CM | POA: Insufficient documentation

## 2018-05-02 DIAGNOSIS — Y832 Surgical operation with anastomosis, bypass or graft as the cause of abnormal reaction of the patient, or of later complication, without mention of misadventure at the time of the procedure: Secondary | ICD-10-CM | POA: Insufficient documentation

## 2018-05-02 DIAGNOSIS — N185 Chronic kidney disease, stage 5: Secondary | ICD-10-CM | POA: Insufficient documentation

## 2018-05-02 DIAGNOSIS — Z8249 Family history of ischemic heart disease and other diseases of the circulatory system: Secondary | ICD-10-CM | POA: Insufficient documentation

## 2018-05-02 DIAGNOSIS — Z841 Family history of disorders of kidney and ureter: Secondary | ICD-10-CM | POA: Diagnosis not present

## 2018-05-02 DIAGNOSIS — E1122 Type 2 diabetes mellitus with diabetic chronic kidney disease: Secondary | ICD-10-CM | POA: Insufficient documentation

## 2018-05-02 DIAGNOSIS — I132 Hypertensive heart and chronic kidney disease with heart failure and with stage 5 chronic kidney disease, or end stage renal disease: Secondary | ICD-10-CM | POA: Diagnosis not present

## 2018-05-02 DIAGNOSIS — T82898A Other specified complication of vascular prosthetic devices, implants and grafts, initial encounter: Secondary | ICD-10-CM | POA: Insufficient documentation

## 2018-05-02 DIAGNOSIS — Z992 Dependence on renal dialysis: Secondary | ICD-10-CM | POA: Diagnosis not present

## 2018-05-02 HISTORY — PX: FISTULA SUPERFICIALIZATION: SHX6341

## 2018-05-02 LAB — GLUCOSE, CAPILLARY
GLUCOSE-CAPILLARY: 124 mg/dL — AB (ref 65–99)
Glucose-Capillary: 125 mg/dL — ABNORMAL HIGH (ref 65–99)

## 2018-05-02 LAB — POCT I-STAT 4, (NA,K, GLUC, HGB,HCT)
Glucose, Bld: 125 mg/dL — ABNORMAL HIGH (ref 65–99)
HEMATOCRIT: 34 % — AB (ref 36.0–46.0)
HEMOGLOBIN: 11.6 g/dL — AB (ref 12.0–15.0)
Potassium: 3.8 mmol/L (ref 3.5–5.1)
Sodium: 140 mmol/L (ref 135–145)

## 2018-05-02 SURGERY — FISTULA SUPERFICIALIZATION
Anesthesia: Monitor Anesthesia Care | Site: Abdomen | Laterality: Right

## 2018-05-02 MED ORDER — SODIUM CHLORIDE 0.9 % IV SOLN
INTRAVENOUS | Status: DC | PRN
  Administered 2018-05-02: 07:00:00 via INTRAVENOUS

## 2018-05-02 MED ORDER — SUCCINYLCHOLINE CHLORIDE 200 MG/10ML IV SOSY
PREFILLED_SYRINGE | INTRAVENOUS | Status: AC
  Filled 2018-05-02: qty 10

## 2018-05-02 MED ORDER — GLYCOPYRROLATE 0.2 MG/ML IJ SOLN
INTRAMUSCULAR | Status: DC | PRN
  Administered 2018-05-02: 0.1 mg via INTRAVENOUS

## 2018-05-02 MED ORDER — MIDAZOLAM HCL 5 MG/5ML IJ SOLN
INTRAMUSCULAR | Status: DC | PRN
  Administered 2018-05-02: 2 mg via INTRAVENOUS

## 2018-05-02 MED ORDER — FENTANYL CITRATE (PF) 250 MCG/5ML IJ SOLN
INTRAMUSCULAR | Status: AC
  Filled 2018-05-02: qty 5

## 2018-05-02 MED ORDER — CHLORHEXIDINE GLUCONATE 4 % EX LIQD: 60.0000 mL | Freq: Once | CUTANEOUS | Status: DC

## 2018-05-02 MED ORDER — 0.9 % SODIUM CHLORIDE (POUR BTL) OPTIME
TOPICAL | Status: DC | PRN
  Administered 2018-05-02: 1000 mL

## 2018-05-02 MED ORDER — SODIUM CHLORIDE 0.9 % IV SOLN
INTRAVENOUS | Status: DC | PRN
  Administered 2018-05-02: 08:00:00

## 2018-05-02 MED ORDER — SODIUM CHLORIDE 0.9 % IV SOLN: INTRAVENOUS | Status: DC

## 2018-05-02 MED ORDER — PROPOFOL 10 MG/ML IV BOLUS
INTRAVENOUS | Status: AC
  Filled 2018-05-02: qty 20

## 2018-05-02 MED ORDER — PROPOFOL 500 MG/50ML IV EMUL
INTRAVENOUS | Status: DC | PRN
  Administered 2018-05-02: 75 ug/kg/min via INTRAVENOUS

## 2018-05-02 MED ORDER — MICROFIBRILLAR COLL HEMOSTAT EX PADS
MEDICATED_PAD | CUTANEOUS | Status: DC | PRN
  Administered 2018-05-02: 1 via TOPICAL

## 2018-05-02 MED ORDER — ONDANSETRON HCL 4 MG/2ML IJ SOLN
INTRAMUSCULAR | Status: DC | PRN
  Administered 2018-05-02: 4 mg via INTRAVENOUS

## 2018-05-02 MED ORDER — SODIUM CHLORIDE 0.9 % IV SOLN
INTRAVENOUS | Status: AC
  Filled 2018-05-02: qty 1.2

## 2018-05-02 MED ORDER — LIDOCAINE 2% (20 MG/ML) 5 ML SYRINGE
INTRAMUSCULAR | Status: AC
  Filled 2018-05-02: qty 5

## 2018-05-02 MED ORDER — OXYCODONE-ACETAMINOPHEN 5-325 MG PO TABS: 1.0000 | ORAL_TABLET | Freq: Four times a day (QID) | ORAL | 0 refills | Status: DC | PRN

## 2018-05-02 MED ORDER — MIDAZOLAM HCL 2 MG/2ML IJ SOLN
INTRAMUSCULAR | Status: AC
  Filled 2018-05-02: qty 2

## 2018-05-02 MED ORDER — FENTANYL CITRATE (PF) 100 MCG/2ML IJ SOLN
INTRAMUSCULAR | Status: DC | PRN
  Administered 2018-05-02 (×2): 50 ug via INTRAVENOUS

## 2018-05-02 SURGICAL SUPPLY — 30 items
ARMBAND PINK RESTRICT EXTREMIT (MISCELLANEOUS) ×2 IMPLANT
CANISTER SUCT 3000ML PPV (MISCELLANEOUS) ×2 IMPLANT
CLIP VESOCCLUDE MED 6/CT (CLIP) ×2 IMPLANT
CLIP VESOCCLUDE SM WIDE 6/CT (CLIP) ×2 IMPLANT
COVER PROBE W GEL 5X96 (DRAPES) IMPLANT
DECANTER SPIKE VIAL GLASS SM (MISCELLANEOUS) ×2 IMPLANT
DERMABOND ADVANCED (GAUZE/BANDAGES/DRESSINGS) ×1
DERMABOND ADVANCED .7 DNX12 (GAUZE/BANDAGES/DRESSINGS) ×1 IMPLANT
DRAIN PENROSE 1/2X12 LTX STRL (WOUND CARE) IMPLANT
ELECT REM PT RETURN 9FT ADLT (ELECTROSURGICAL) ×2
ELECTRODE REM PT RTRN 9FT ADLT (ELECTROSURGICAL) ×1 IMPLANT
GLOVE BIO SURGEON STRL SZ7 (GLOVE) ×2 IMPLANT
GLOVE BIOGEL PI IND STRL 7.5 (GLOVE) ×1 IMPLANT
GLOVE BIOGEL PI INDICATOR 7.5 (GLOVE) ×1
GOWN STRL REUS W/ TWL LRG LVL3 (GOWN DISPOSABLE) ×3 IMPLANT
GOWN STRL REUS W/TWL LRG LVL3 (GOWN DISPOSABLE) ×3
HEMOSTAT SPONGE AVITENE ULTRA (HEMOSTASIS) ×2 IMPLANT
KIT BASIN OR (CUSTOM PROCEDURE TRAY) ×2 IMPLANT
KIT TURNOVER KIT B (KITS) ×2 IMPLANT
NS IRRIG 1000ML POUR BTL (IV SOLUTION) ×2 IMPLANT
PACK CV ACCESS (CUSTOM PROCEDURE TRAY) ×2 IMPLANT
PAD ARMBOARD 7.5X6 YLW CONV (MISCELLANEOUS) ×4 IMPLANT
SUT MNCRL AB 4-0 PS2 18 (SUTURE) ×2 IMPLANT
SUT PROLENE 6 0 BV (SUTURE) IMPLANT
SUT PROLENE 7 0 BV 1 (SUTURE) ×2 IMPLANT
SUT VIC AB 3-0 SH 27 (SUTURE) ×2
SUT VIC AB 3-0 SH 27X BRD (SUTURE) ×2 IMPLANT
TOWEL GREEN STERILE (TOWEL DISPOSABLE) ×2 IMPLANT
UNDERPAD 30X30 (UNDERPADS AND DIAPERS) ×2 IMPLANT
WATER STERILE IRR 1000ML POUR (IV SOLUTION) ×2 IMPLANT

## 2018-05-02 NOTE — Telephone Encounter (Signed)
sch appt 06/01/18 230pm s/p R BC AVF Superfic lvm waint on pt to confirm

## 2018-05-02 NOTE — H&P (Signed)
Brief History and Physical  History of Present Illness   Pamela Campbell is a 57 y.o. female who presents with chief complaint: inability to cannulate R BC AVF.  The patient presents today for superficialization of R BC AVF.    Past Medical History:  Diagnosis Date  . Chronic diastolic CHF (congestive heart failure) ([place])    a. 03/2015: echo w/ EF of 50-55%, no WMA, Grade 2 DD, trivial AI, mild MR.  . Chronic kidney disease (CKD), stage V ([place])    dialysis MWF starting at another facility TTS  . Diabetes mellitus without complication ([place])   . Hypertension     Past Surgical History:  Procedure Laterality Date  . AV FISTULA PLACEMENT Right 01/04/2018   Procedure: ARTERIOVENOUS (AV) FISTULA CREATION RIGHT ARM;  Surgeon: Conrad Summerfield, MD;  Location: [place];  Service: Vascular;  Laterality: Right;  . AV FISTULA PLACEMENT Right 01/24/2018   Procedure: ARTERIOVENOUS (AV) FISTULA CREATION RIGHT BRACHIOCEPHALIC;  Surgeon: Conrad Wabeno, MD;  Location: [place];  Service: Vascular;  Laterality: Right;  . CARDIAC CATHETERIZATION N/A 11/02/2016   Procedure: Left Heart Cath and Coronary Angiography;  Surgeon: Sherren Mocha, MD;  Location: [place] CV LAB;  Service: Cardiovascular;  Laterality: N/A;  . CESAREAN SECTION    . COLONOSCOPY    . FRACTURE SURGERY     shoulder left  . INSERTION OF DIALYSIS CATHETER Right 01/04/2018   Procedure: INSERTION OF DIALYSIS CATHETER RIGHT INTERNAL JUGULAR;  Surgeon: Conrad Deerfield, MD;  Location: [place];  Service: Vascular;  Laterality: Right;  . IR FLUORO GUIDE CV LINE RIGHT  12/31/2017  . IR US GUIDE VASC ACCESS RIGHT  12/31/2017  . LIGATION OF ARTERIOVENOUS  FISTULA Right 01/24/2018   Procedure: LIGATION OF  RIGHT RADIOCEPHALIC ARTERIOVENOUS  FISTULA;  Surgeon: Conrad [place], MD;  Location: [place];  Service: Vascular;  Laterality: Right;  . TOOTH EXTRACTION    . TUBAL LIGATION      Social History   Socioeconomic History  . Marital status: Married   Spouse name: Not on file  . Number of children: Not on file  . Years of education: Not on file  . Highest education level: Not on file  Occupational History  . Not on file  Social Needs  . Financial resource strain: Not on file  . Food insecurity:    Worry: Not on file    Inability: Not on file  . Transportation needs:    Medical: Not on file    Non-medical: Not on file  Tobacco Use  . Smoking status: Former Smoker    Last attempt to quit: 03/21/1998    Years since quitting: 20.1  . Smokeless tobacco: Never Used  Substance and Sexual Activity  . Alcohol use: No  . Drug use: No  . Sexual activity: Never    Birth control/protection: None  Lifestyle  . Physical activity:    Days per week: Not on file    Minutes per session: Not on file  . Stress: Not on file  Relationships  . Social connections:    Talks on phone: Not on file    Gets together: Not on file    Attends religious service: Not on file    Active member of club or organization: Not on file    Attends meetings of clubs or organizations: Not on file    Relationship status: Not on file  . Intimate partner violence:    Fear of  current or ex partner: Not on file    Emotionally abused: Not on file    Physically abused: Not on file    Forced sexual activity: Not on file  Other Topics Concern  . Not on file  Social History Narrative  . Not on file    Family History  Problem Relation Age of Onset  . Diabetes Mellitus II Mother   . Hypertension Mother   . Hyperlipidemia Mother   . Kidney disease Mother   . Heart disease Father   . Heart attack Father 46  . Congestive Heart Failure Brother   . Congestive Heart Failure Son     Current Facility-Administered Medications  Medication Dose Route Frequency Provider Last Rate Last Dose  . 0.9 %  sodium chloride infusion   Intravenous Continuous Conrad [place], MD      . ceFAZolin (ANCEF) 3 g in dextrose 5 % 50 mL IVPB  3 g Intravenous To SS-Surg Conrad [place], MD        . chlorhexidine (HIBICLENS) 4 % liquid 4 application  60 mL Topical Once Conrad Barnard, MD       And  . Derrill Memo ON 05/03/2018] chlorhexidine (HIBICLENS) 4 % liquid 4 application  60 mL Topical Once Conrad [place], MD        Allergies  Allergen Reactions  . Compazine [Prochlorperazine Edisylate] Swelling    Tongue  Hardens and comes out of mouth  . Vioxx [Rofecoxib] Other (See Comments)    Bleeding    Review of Systems: As listed above, otherwise negative.   Physical Examination   Vitals:   04/29/18 1055 05/02/18 0554 05/02/18 0556  BP:   (!) 180/73  Pulse:  86   Resp:  20   Temp:  98.3 F (36.8 C)   TempSrc:  Oral   SpO2:  100%   Weight: 257 lb 15 oz (117 kg)  257 lb (116.6 kg)   Body mass index is 44.11 kg/m.  General Alert, O x 3, WD, NAD  Pulmonary Sym exp, good B air movt, CTA B  Cardiac RRR, Nl S1, S2, no Murmurs, No rubs, No S3,S4  Musculo- skeletal Palpable thrill in R arm, +bruit which attenuates in upper arm due to depth  Neurologic Pain and light touch intact in extremities,     Laboratory  See Pamela Campbell is a 57 y.o. female who presents with: deep R BC AVF   The patient is scheduled for: R BC AVF superficialization.  Risk, benefits, and alternatives to access surgery were discussed.  The patient is aware the risks include but are not limited to: bleeding, infection, steal syndrome, nerve damage, ischemic monomelic neuropathy, failure to mature, and need for additional procedures.  The patient is aware of the risks and agrees to proceed.   Pamela Barthel, MD, FACS [facility] [place] Office: [phone] Pager: [phone]  05/02/2018, 6:59 AM

## 2018-05-02 NOTE — Anesthesia Postprocedure Evaluation (Signed)
Anesthesia Post Note  Patient: Sherronda Sweigert  Procedure(s) Performed: FISTULA SUPERFICIALIZATION RIGHT ARTERIOVENOUS FISTULA (Right Abdomen)     Patient location during evaluation: PACU Anesthesia Type: MAC Level of consciousness: awake and alert Pain management: pain level controlled Vital Signs Assessment: post-procedure vital signs reviewed and stable Respiratory status: spontaneous breathing, nonlabored ventilation and respiratory function stable Cardiovascular status: stable and blood pressure returned to baseline Postop Assessment: no apparent nausea or vomiting Anesthetic complications: no    Last Vitals:  Vitals:   05/02/18 0945 05/02/18 0956  BP:  (!) 169/80  Pulse:  74  Resp:  20  Temp: 36.8 C   SpO2:  97%    Last Pain:  Vitals:   05/02/18 0554  TempSrc: Oral                 Abisola Carrero,W. EDMOND

## 2018-05-02 NOTE — Anesthesia Procedure Notes (Signed)
Procedure Name: MAC Date/Time: 05/02/2018 7:40 AM Performed by: Lance Coon, CRNA Pre-anesthesia Checklist: Patient identified, Emergency Drugs available, Suction available, Patient being monitored and Timeout performed Patient Re-evaluated:Patient Re-evaluated prior to induction Oxygen Delivery Method: Nasal cannula

## 2018-05-02 NOTE — Transfer of Care (Signed)
Immediate Anesthesia Transfer of Care Note  Patient: Pamela Campbell  Procedure(s) Performed: FISTULA SUPERFICIALIZATION RIGHT ARTERIOVENOUS FISTULA (Right Abdomen)  Patient Location: PACU  Anesthesia Type:MAC  Level of Consciousness: awake and patient cooperative  Airway & Oxygen Therapy: Patient Spontanous Breathing  Post-op Assessment: Report given to RN and Post -op Vital signs reviewed and stable  Post vital signs: Reviewed and stable  Last Vitals:  Vitals Value Taken Time  BP 153/73 05/02/2018  8:54 AM  Temp 36.8 C 05/02/2018  8:54 AM  Pulse 87 05/02/2018  8:56 AM  Resp 22 05/02/2018  8:56 AM  SpO2 93 % 05/02/2018  8:56 AM  Vitals shown include unvalidated device data.  Last Pain:  Vitals:   05/02/18 0554  TempSrc: Oral         Complications: No apparent anesthesia complications

## 2018-05-02 NOTE — Op Note (Signed)
    OPERATIVE NOTE   PROCEDURE: 1. right brachiocephalic arteriovenous fistula superficialization 2. Side branch ligation x 2  PRE-OPERATIVE DIAGNOSIS: Deep arteriovenous fistula   POST-OPERATIVE DIAGNOSIS: same as above   SURGEON: Adele Barthel, MD  ASSISTANT(S): RNFA  ANESTHESIA: local and MAC  ESTIMATED BLOOD LOSS: 50 cc  FINDING(S): 1. Deep fistula > 1 cm through proximal 2/3 2. Fistula >6 mm throughout 3. Two large side branches ligation 4. Palpable thrill at end of case  SPECIMEN(S):  none  INDICATIONS:   Pamela Campbell is a 57 y.o. female who presents with mature arteriovenous fistula that is > 6 mm deep.  It was felt that without superficialization of the arteriovenous fistula it could not be used for hemodialysis.  Risk, benefits, and alternatives to access surgery were discussed.  The patient is aware the risks include but are not limited to: bleeding, infection, steal syndrome, nerve damage, ischemic monomelic neuropathy, thrombosis of the access, need for additional procedures, death and stroke.  The patient agrees to proceed forward with the procedure.   DESCRIPTION: After obtain full informed written consent, the patient was brought back to the operating room and placed supine upon the operating table.  The patient received IV antibiotics prior to induction.  After obtaining adequate anesthesia, the patient was prepped and draped in the standard fashion for: right arm access.  I turned my attention to the antecubitum, where I could feel the arterial anastomosis.  Using the Sonosite, I marked out the pathway of the cephalic vein all the way up to the axilla.  I made an incision from 3 cm proximal to the anastomosis up to the axillary extent of the vein.  Using electrocautery and blunt dissection, I developed a plane down the vein.  I tied off 2 side branches with silk ties prior to transecting them.  In such fashion, I fully mobilized the vein from 3 cm proximal to the  anastomosis up to its axillary extent.  The vein was noted to be >6 mm in diameter externally.  Using the electrocautery, a shallow trough (6-8 mm deep) was developed in the lateral subcutaneous tissue in the incision.  The vein was transposed into the trough and secured with interrupted stitches of 3-0 Vicryl.  The subcutaneous tissue was reapproximated with a running stitch of 3-0 Vicryl.  The skin was reapproximated with a running subcuticular of 4-0 Monocryl.  The skin was cleaned, dried, and reinforced with Dermabond.   COMPLICATIONS: none  CONDITION: stable   Adele Barthel, MD, [facility] [place] Office: [phone] Pager: [phone]  05/02/2018, 8:47 AM

## 2018-05-02 NOTE — Telephone Encounter (Signed)
-----   Message from Penni Homans, RN sent at 05/02/2018 10:13 AM EDT ----- Regarding: Appointment   ----- Message ----- From: Conrad Devon, MD Sent: 05/02/2018   8:49 AM To: Vvs Charge Pool  [place] [id number] 1961/11/30  PROCEDURE: right brachiocephalic arteriovenous fistula superficialization Side branch ligation x 2  Follow-up: 4 weeks ([facility])

## 2018-05-03 ENCOUNTER — Encounter: Payer: Self-pay | Admitting: *Deleted

## 2018-05-03 ENCOUNTER — Encounter (HOSPITAL_COMMUNITY): Payer: Self-pay | Admitting: Vascular Surgery

## 2018-05-05 ENCOUNTER — Encounter: Payer: Self-pay | Admitting: *Deleted

## 2018-05-13 ENCOUNTER — Encounter (INDEPENDENT_AMBULATORY_CARE_PROVIDER_SITE_OTHER): Payer: Managed Care, Other (non HMO) | Admitting: Vascular Surgery

## 2018-05-13 DIAGNOSIS — Z0279 Encounter for issue of other medical certificate: Secondary | ICD-10-CM

## 2018-05-19 ENCOUNTER — Telehealth: Payer: Self-pay

## 2018-05-19 NOTE — Telephone Encounter (Signed)
Left message for patient regarding short term disability paperwork on 05/17/18. No return call was received. STD ppw was filled out and faxed to employer @ [phone] on 05/19/2018.

## 2018-05-25 ENCOUNTER — Other Ambulatory Visit (HOSPITAL_COMMUNITY)
Admission: RE | Admit: 2018-05-25 | Discharge: 2018-05-25 | Disposition: A | Discharge status: Home / Self Care | Payer: Managed Care, Other (non HMO) | Source: Ambulatory Visit | Attending: Obstetrics and Gynecology | Admitting: Obstetrics and Gynecology

## 2018-05-25 ENCOUNTER — Other Ambulatory Visit: Payer: Self-pay | Admitting: Obstetrics and Gynecology

## 2018-05-25 DIAGNOSIS — Z124 Encounter for screening for malignant neoplasm of cervix: Secondary | ICD-10-CM | POA: Diagnosis not present

## 2018-05-27 LAB — CYTOLOGY - PAP
Diagnosis: NEGATIVE
HPV: NOT DETECTED

## 2018-06-01 ENCOUNTER — Encounter: Payer: Self-pay | Admitting: Physician Assistant

## 2018-06-01 ENCOUNTER — Ambulatory Visit (INDEPENDENT_AMBULATORY_CARE_PROVIDER_SITE_OTHER): Payer: Self-pay | Admitting: Physician Assistant

## 2018-06-01 ENCOUNTER — Other Ambulatory Visit: Payer: Self-pay

## 2018-06-01 VITALS — BP 177/87 | HR 83 | Temp 98.4°F | Resp 20 | Ht 64.0 in | Wt 254.0 lb

## 2018-06-01 DIAGNOSIS — Z992 Dependence on renal dialysis: Secondary | ICD-10-CM

## 2018-06-01 DIAGNOSIS — N186 End stage renal disease: Secondary | ICD-10-CM

## 2018-06-01 NOTE — Progress Notes (Signed)
    Postoperative Access Visit   History of Present Illness   Pamela Campbell is a 57 y.o. year old female who presents for postoperative follow-up for: right arm cephalic vein superficialization by Dr. Bridgett Larsson (Date: 05/02/18).  The patient's wounds are healed.  The patient denies steal symptoms.  The patient is able to complete their activities of daily living.  The patient is dialyzing on a TTS schedule from R IJ TDC without complications.  Patient is reluctant to let dialysis use fistula because she is afraid of blood and also does not want to develop aneurysmal dilatations from repeated use.   Physical Examination   Vitals:   06/01/18 1433  BP: (!) 177/87  Pulse: 83  Resp: 20  Temp: 98.4 F (36.9 C)  TempSrc: Oral  SpO2: 99%  Weight: 254 lb (115.2 kg)  Height: 5\' 4"  (1.626 m)   Body mass index is 43.6 kg/m.  right arm Incision is healed, hand grip is 5/5, sensation in digits is intact, easily palpable thrill, bruit can be auscultated; palpable R radial pulse     Medical Decision Making   Pamela Campbell is a 57 y.o. year old female who presents s/p R brachiocephalic fistula superficialization by Dr. Bridgett Larsson 05/02/18   The patient's access is ready for use.  The patient's tunneled dialysis catheter can be removed when Nephrology is comfortable with the performance of R arm AV fistula  Advised patient to let dialysis center use R arm AV fistula so TDC can be removed sooner rather than later;  She understands the risk of central venous stenosis and systemic infection due to [place], PA-C [facility] [place] Office: [phone]

## 2018-07-07 ENCOUNTER — Other Ambulatory Visit: Payer: Self-pay | Admitting: Obstetrics and Gynecology

## 2018-07-12 ENCOUNTER — Inpatient Hospital Stay (HOSPITAL_COMMUNITY)
Admission: RE | Admit: 2018-07-12 | Discharge: 2018-07-12 | Disposition: A | Discharge status: Home / Self Care | Payer: Managed Care, Other (non HMO) | Source: Ambulatory Visit

## 2018-07-12 ENCOUNTER — Encounter (HOSPITAL_COMMUNITY)
Admission: RE | Admit: 2018-07-12 | Discharge: 2018-07-12 | Disposition: A | Discharge status: Home / Self Care | Payer: Managed Care, Other (non HMO) | Source: Ambulatory Visit | Attending: Obstetrics and Gynecology | Admitting: Obstetrics and Gynecology

## 2018-07-12 ENCOUNTER — Other Ambulatory Visit: Payer: Self-pay

## 2018-07-12 ENCOUNTER — Encounter (HOSPITAL_COMMUNITY): Payer: Self-pay

## 2018-07-12 DIAGNOSIS — Z01818 Encounter for other preprocedural examination: Secondary | ICD-10-CM | POA: Insufficient documentation

## 2018-07-12 HISTORY — DX: Pneumonia, unspecified organism: J18.9

## 2018-07-12 NOTE — Pre-Procedure Instructions (Signed)
Dr. Clide Cliff viewed EKG, aware of history. Patient missed dialysis appointment today and surgery scheduled tomorrow. He talked with patient and she needs to have surgery day after dialysis. Will need to have labs day of surgery. Dr. Landry Mellow notified. Left message for Helene Kelp at dr. Sundra Aland office

## 2018-07-12 NOTE — Patient Instructions (Addendum)
Your procedure is scheduled on: Wednesday July 13, 2018 at 7:30 am  Enter through the Main Entrance of [facility] at: 6:00 am  Pick up the phone at the desk and dial [phone].  Call this number if you have problems the morning of surgery: [phone].  Remember: Do NOT eat food or drink any liquids after: Midnight on Tuesday July 23  Take these medicines the morning of surgery with a SIP OF WATER: AMLODIPINE. ZOFRAN IF NEEDED  STOP ALL VITAMINS, SUPPLEMENTS, HERBAL MEDICATIONS NOW  DO NOT SMOKE DAY OF SURGERY  BRUSH YOUR TEETH DAY OF SURGERY  Do NOT wear jewelry (body piercing), metal hair clips/bobby pins, make-up, or nail polish. Do NOT wear lotions, powders, or perfumes.  You may wear deoderant. Do NOT shave for 48 hours prior to surgery. Do NOT bring valuables to the hospital. Contacts, dentures, or bridgework may not be worn into surgery.  Have a responsible adult drive you home and stay with you for 24 hours after your procedure

## 2018-07-13 SURGERY — DILATATION & CURETTAGE/HYSTEROSCOPY WITH MYOSURE
Anesthesia: Choice

## 2018-07-21 IMAGING — CR DG CHEST 2V
2 series · 2 of 2 positions shown · non-contrast
Comparison: Chest x-ray of December 08, 2017

CLINICAL DATA: Two weeks of shortness of breath and cough. History
of CHF, hypertension, diabetes former smoker.

EXAM:
CHEST  2 VIEW

[chest lat]
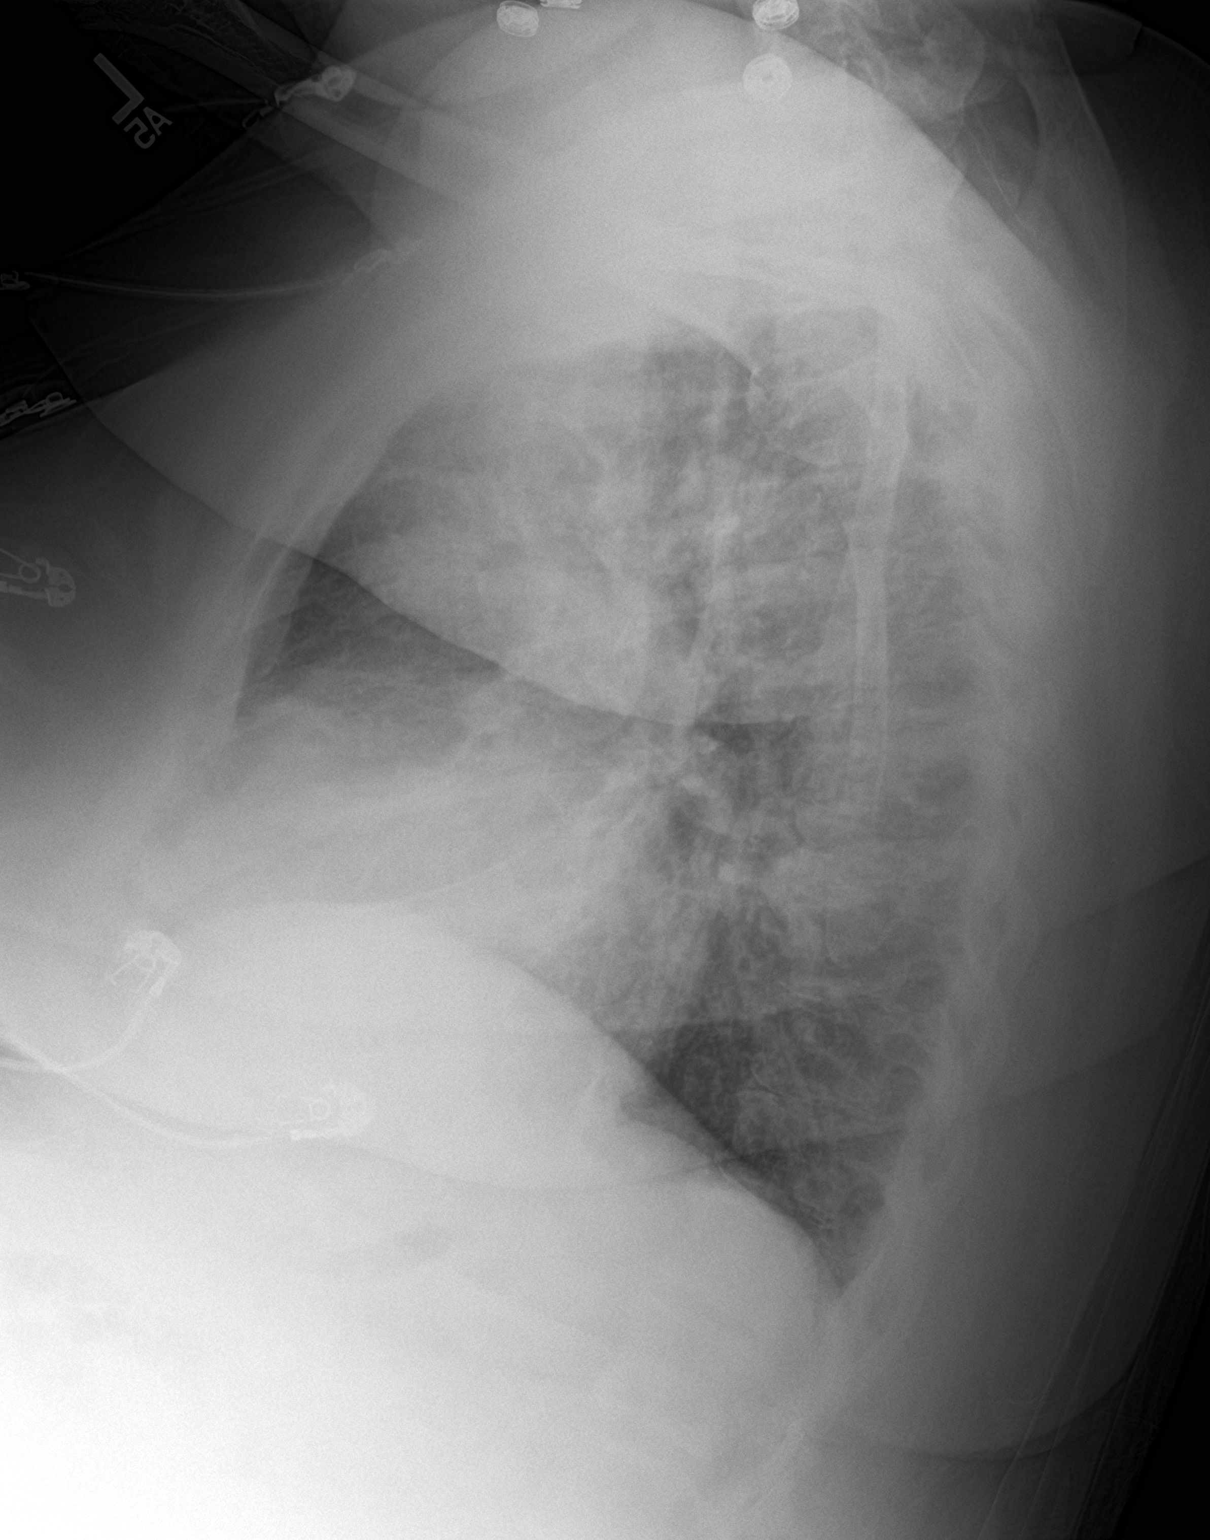

[chest ap]
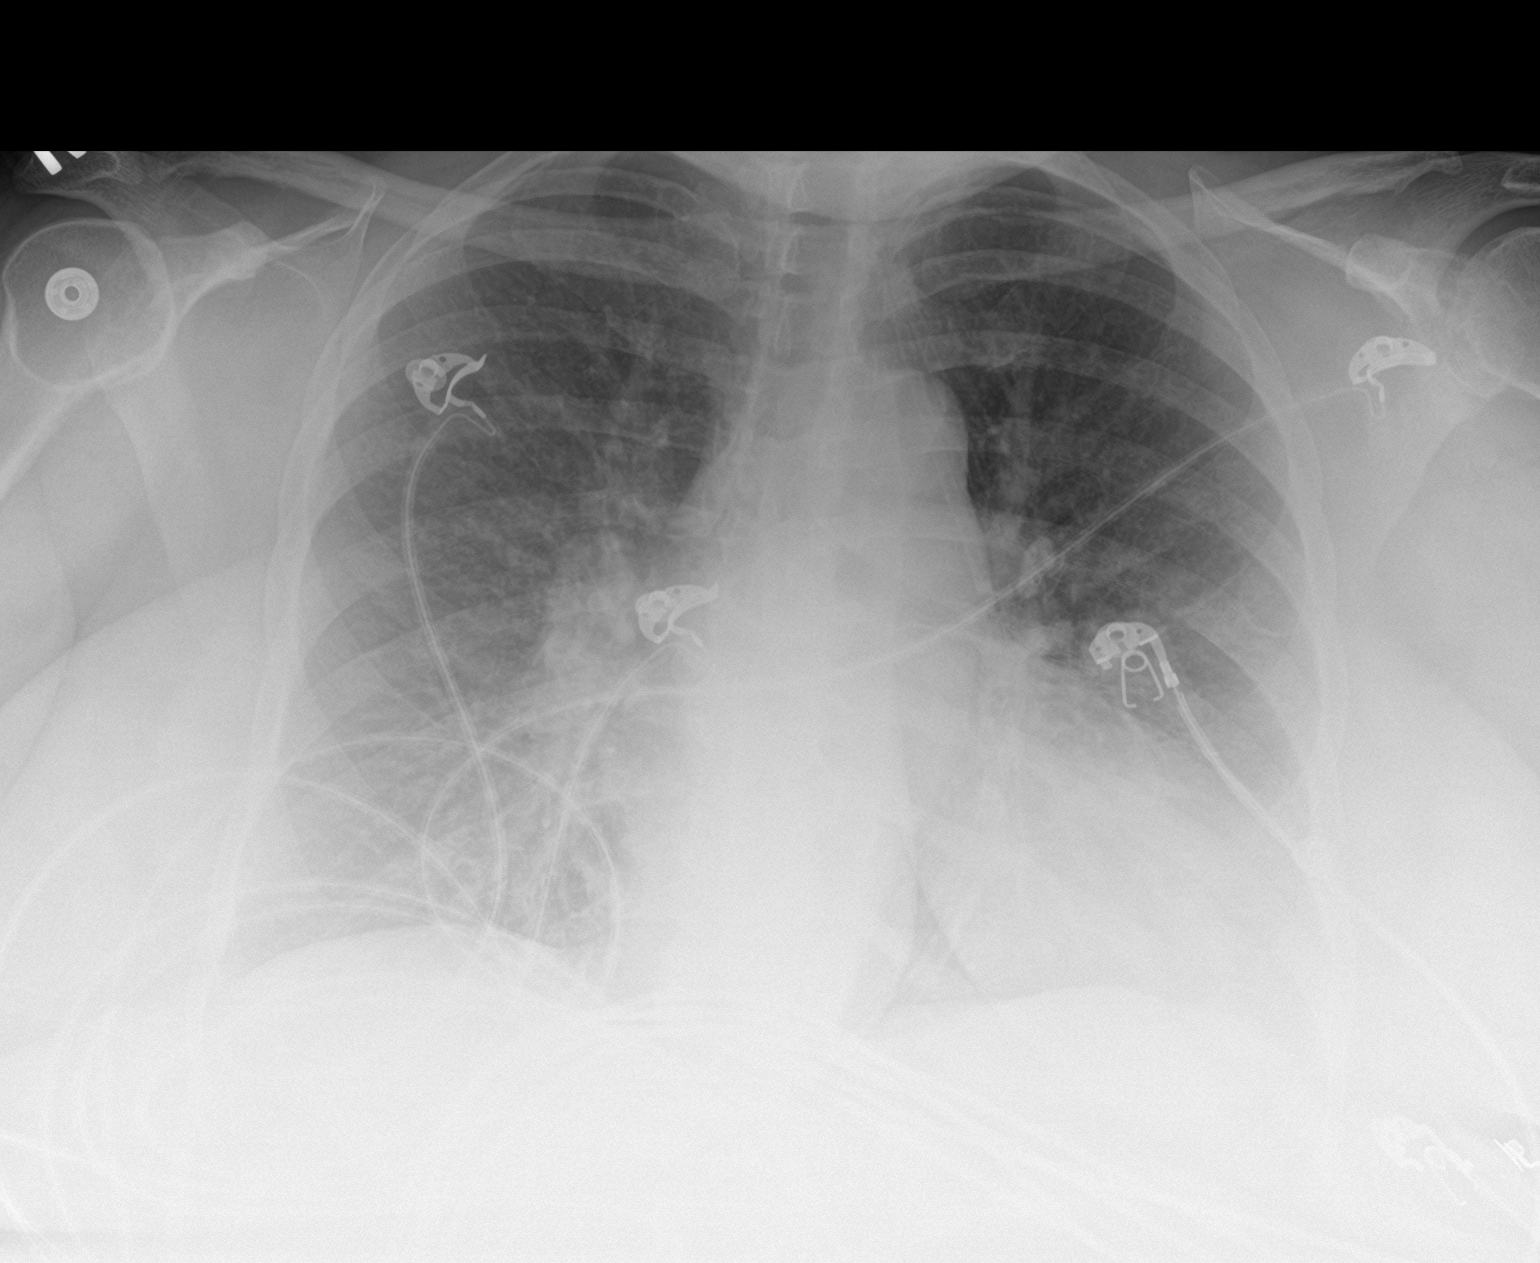

[2 of 2 positions shown; findings below may reference images not displayed]

FINDINGS: The lungs are well-expanded. The interstitial markings remain
increased. The pulmonary vascularity while in [place] is not as
conspicuous as on the previous study. The cardiac silhouette remains
enlarged. There is no significant pleural effusion. The observed
bony thorax is unremarkable.
IMPRESSION: CHF with mild interstitial edema not as conspicuous as on the
previous study. No acute pneumonia.

## 2018-07-22 IMAGING — US IR US GUIDE VASC ACCESS RIGHT
1 series · 2 of 2 positions shown · non-contrast
Comparison: none

INDICATION: 56-year-old female with end-stage renal disease in need of
hemodialysis

[Series 1: ir fluoro/shunt/fist · 2 of 2 slices shown]
[im 1/2]
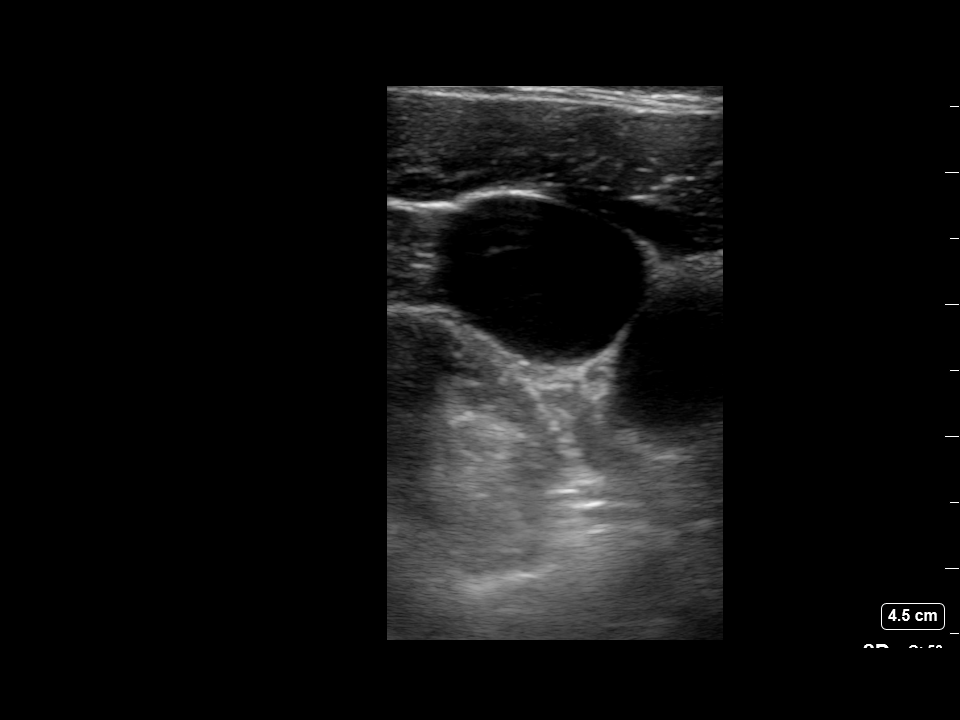
[im 2/2]
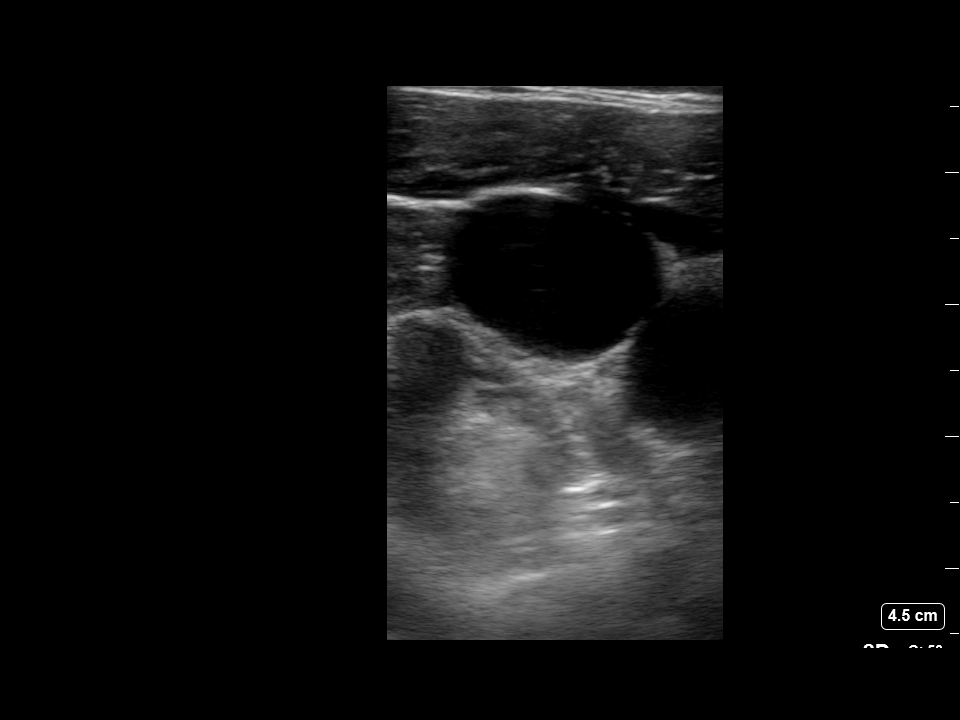

[2 of 2 positions shown; findings below may reference images not displayed]

EXAM:
IR RIGHT FLOURO GUIDE CV LINE; IR ULTRASOUND GUIDANCE VASC ACCESS
RIGHT

MEDICATIONS:
None

ANESTHESIA/SEDATION:
None

FLUOROSCOPY TIME:  Fluoroscopy Time: 0 minutes 6 seconds (2 mGy).

COMPLICATIONS:
None immediate.

PROCEDURE:
Informed written consent was obtained from the patient after a
thorough discussion of the procedural risks, benefits and
alternatives. All questions were addressed. Maximal Sterile Barrier
Technique was utilized including caps, mask, sterile gowns, sterile
gloves, sterile drape, hand hygiene and skin antiseptic. A timeout
was performed prior to the initiation of the procedure.

The right internal jugular vein was interrogated with ultrasound and
found to be widely patent. An image was obtained and stored for the
medical record. Local anesthesia was attained by infiltration with
1% lidocaine. A small dermatotomy was made. Under real-time
sonographic guidance, the vessel was punctured with a 21 gauge
micropuncture needle. Using standard technique, the initial micro
needle was exchanged over a 0.018 micro wire for a transitional 4
French micro sheath. The micro sheath was removed in the soft tissue
tract dilated. A 20 French Trialysis catheter was then advanced over
the wire and the catheter tips were positioned in the mid right
atrium under fluoroscopy. An image was obtained and stored.

The catheter was flushed with heparinized saline and secured to the
skin with 0 Prolene suture. A sterile bandage was applied.
IMPRESSION: Successful placement of a right IJ approach 20 cm Trialysis
catheter. The catheter tips are in the right atrium and ready for
immediate use.

## 2018-08-25 NOTE — Patient Instructions (Addendum)
Your procedure is scheduled on: Friday September 02, 2018 at 11:00 am  Enter through the Main Entrance of [facility] at: 9:30 am  Pick up the phone at the desk and dial [phone].  Call this number if you have problems the morning of surgery: [phone].  Remember: Do NOT eat food or drink any liquids after: Midnight on Thursday September 12  Take these medicines the morning of surgery with a SIP OF WATER: Amlodipine. Can use Zofran if needed  STOP ALL VITAMINS, SUPPLEMENTS, HERBAL MEDICATIONS NOW  DO NOT SMOKE DAY OF SURGERY  BRUSH YOUR TEETH DAY OF SURGERY  Do NOT wear jewelry (body piercing), metal hair clips/bobby pins, make-up, or nail polish. Do NOT wear lotions, powders, or perfumes.  You may wear deoderant. Do NOT shave for 48 hours prior to surgery. Do NOT bring valuables to the hospital. Contacts, dentures, or bridgework may not be worn into surgery.   Have a responsible adult drive you home and stay with you for 24 hours after your procedure

## 2018-08-26 ENCOUNTER — Encounter (HOSPITAL_COMMUNITY)
Admission: RE | Admit: 2018-08-26 | Discharge: 2018-08-26 | Disposition: A | Discharge status: Home / Self Care | Payer: Managed Care, Other (non HMO) | Source: Ambulatory Visit | Attending: Obstetrics and Gynecology | Admitting: Obstetrics and Gynecology

## 2018-08-26 ENCOUNTER — Encounter (HOSPITAL_COMMUNITY): Payer: Self-pay

## 2018-08-26 ENCOUNTER — Other Ambulatory Visit: Payer: Self-pay

## 2018-08-26 DIAGNOSIS — N950 Postmenopausal bleeding: Secondary | ICD-10-CM | POA: Diagnosis not present

## 2018-08-26 HISTORY — DX: Dyspnea, unspecified: R06.00

## 2018-08-26 HISTORY — DX: Anemia, unspecified: D64.9

## 2018-08-26 NOTE — Pre-Procedure Instructions (Signed)
DR. Cletus Gash MILLER AWARE OF HISTORY, VIEWED EKG. NO CLEARANCE NEEDED

## 2018-09-02 ENCOUNTER — Encounter (HOSPITAL_COMMUNITY)
Admission: RE | Disposition: A | Discharge status: Home / Self Care | Payer: Self-pay | Source: Ambulatory Visit | Attending: Obstetrics and Gynecology

## 2018-09-02 ENCOUNTER — Ambulatory Visit (HOSPITAL_COMMUNITY): Payer: Managed Care, Other (non HMO) | Admitting: Certified Registered Nurse Anesthetist

## 2018-09-02 ENCOUNTER — Ambulatory Visit (HOSPITAL_COMMUNITY)
Admission: RE | Admit: 2018-09-02 | Payer: Managed Care, Other (non HMO) | Source: Ambulatory Visit | Admitting: Obstetrics and Gynecology

## 2018-09-02 ENCOUNTER — Other Ambulatory Visit: Payer: Self-pay

## 2018-09-02 ENCOUNTER — Ambulatory Visit (HOSPITAL_COMMUNITY)
Admission: RE | Admit: 2018-09-02 | Discharge: 2018-09-02 | Disposition: A | Discharge status: Home / Self Care | Payer: Managed Care, Other (non HMO) | Source: Ambulatory Visit | Attending: Obstetrics and Gynecology | Admitting: Obstetrics and Gynecology

## 2018-09-02 ENCOUNTER — Encounter (HOSPITAL_COMMUNITY): Payer: Self-pay

## 2018-09-02 DIAGNOSIS — E1122 Type 2 diabetes mellitus with diabetic chronic kidney disease: Secondary | ICD-10-CM | POA: Diagnosis not present

## 2018-09-02 DIAGNOSIS — N840 Polyp of corpus uteri: Secondary | ICD-10-CM | POA: Diagnosis not present

## 2018-09-02 DIAGNOSIS — Z79899 Other long term (current) drug therapy: Secondary | ICD-10-CM | POA: Diagnosis not present

## 2018-09-02 DIAGNOSIS — I120 Hypertensive chronic kidney disease with stage 5 chronic kidney disease or end stage renal disease: Secondary | ICD-10-CM | POA: Insufficient documentation

## 2018-09-02 DIAGNOSIS — Z87891 Personal history of nicotine dependence: Secondary | ICD-10-CM | POA: Insufficient documentation

## 2018-09-02 DIAGNOSIS — Z992 Dependence on renal dialysis: Secondary | ICD-10-CM | POA: Diagnosis not present

## 2018-09-02 DIAGNOSIS — Z888 Allergy status to other drugs, medicaments and biological substances status: Secondary | ICD-10-CM | POA: Insufficient documentation

## 2018-09-02 DIAGNOSIS — N186 End stage renal disease: Secondary | ICD-10-CM | POA: Insufficient documentation

## 2018-09-02 DIAGNOSIS — E1100 Type 2 diabetes mellitus with hyperosmolarity without nonketotic hyperglycemic-hyperosmolar coma (NKHHC): Secondary | ICD-10-CM

## 2018-09-02 DIAGNOSIS — N950 Postmenopausal bleeding: Secondary | ICD-10-CM | POA: Diagnosis not present

## 2018-09-02 HISTORY — PX: DILATATION & CURETTAGE/HYSTEROSCOPY WITH MYOSURE: SHX6511

## 2018-09-02 LAB — CBC
HCT: 38.3 % (ref 36.0–46.0)
HEMOGLOBIN: 12.1 g/dL (ref 12.0–15.0)
MCH: 29.3 pg (ref 26.0–34.0)
MCHC: 31.6 g/dL (ref 30.0–36.0)
MCV: 92.7 fL (ref 78.0–100.0)
Platelets: 225 10*3/uL (ref 150–400)
RBC: 4.13 MIL/uL (ref 3.87–5.11)
RDW: 15.7 % — ABNORMAL HIGH (ref 11.5–15.5)
WBC: 11 10*3/uL — ABNORMAL HIGH (ref 4.0–10.5)

## 2018-09-02 LAB — BASIC METABOLIC PANEL
Anion gap: 12 (ref 5–15)
BUN: 54 mg/dL — ABNORMAL HIGH (ref 6–20)
CALCIUM: 9.3 mg/dL (ref 8.9–10.3)
CHLORIDE: 106 mmol/L (ref 98–111)
CO2: 24 mmol/L (ref 22–32)
CREATININE: 7.64 mg/dL — AB (ref 0.44–1.00)
GFR calc Af Amer: 6 mL/min — ABNORMAL LOW (ref >60)
GFR calc non Af Amer: 5 mL/min — ABNORMAL LOW (ref >60)
GLUCOSE: 131 mg/dL — AB (ref 70–99)
Potassium: 4.6 mmol/L (ref 3.5–5.1)
Sodium: 142 mmol/L (ref 135–145)

## 2018-09-02 LAB — GLUCOSE, CAPILLARY: Glucose-Capillary: 129 mg/dL — ABNORMAL HIGH (ref 70–99)

## 2018-09-02 SURGERY — DILATATION & CURETTAGE/HYSTEROSCOPY WITH MYOSURE
Anesthesia: General | Site: Uterus

## 2018-09-02 MED ORDER — DEXAMETHASONE SODIUM PHOSPHATE 4 MG/ML IJ SOLN
INTRAMUSCULAR | Status: AC
  Filled 2018-09-02: qty 1

## 2018-09-02 MED ORDER — FENTANYL CITRATE (PF) 100 MCG/2ML IJ SOLN
INTRAMUSCULAR | Status: AC
  Filled 2018-09-02: qty 2

## 2018-09-02 MED ORDER — SODIUM CHLORIDE 0.9 % IR SOLN
Status: DC | PRN
  Administered 2018-09-02: 3000 mL

## 2018-09-02 MED ORDER — MIDAZOLAM HCL 2 MG/2ML IJ SOLN
INTRAMUSCULAR | Status: DC | PRN
  Administered 2018-09-02: 1 mg via INTRAVENOUS

## 2018-09-02 MED ORDER — LIDOCAINE HCL (CARDIAC) PF 100 MG/5ML IV SOSY
PREFILLED_SYRINGE | INTRAVENOUS | Status: DC | PRN
  Administered 2018-09-02: 100 mg via INTRAVENOUS

## 2018-09-02 MED ORDER — OXYCODONE HCL 5 MG PO TABS: 5.0000 mg | ORAL_TABLET | Freq: Four times a day (QID) | ORAL | 0 refills | Status: DC | PRN

## 2018-09-02 MED ORDER — BUPIVACAINE HCL (PF) 0.25 % IJ SOLN
INTRAMUSCULAR | Status: DC | PRN
  Administered 2018-09-02: 20 mL

## 2018-09-02 MED ORDER — SODIUM CHLORIDE 0.9 % IV SOLN
INTRAVENOUS | Status: DC
  Administered 2018-09-02: 11:00:00 via INTRAVENOUS

## 2018-09-02 MED ORDER — BACITRACIN ZINC 500 UNIT/GM EX OINT
TOPICAL_OINTMENT | CUTANEOUS | Status: AC
  Filled 2018-09-02: qty 28.35

## 2018-09-02 MED ORDER — BUPIVACAINE HCL (PF) 0.25 % IJ SOLN
INTRAMUSCULAR | Status: AC
  Filled 2018-09-02: qty 30

## 2018-09-02 MED ORDER — MIDAZOLAM HCL 2 MG/2ML IJ SOLN
INTRAMUSCULAR | Status: AC
  Filled 2018-09-02: qty 2

## 2018-09-02 MED ORDER — KETOROLAC TROMETHAMINE 30 MG/ML IJ SOLN
INTRAMUSCULAR | Status: AC
  Filled 2018-09-02: qty 1

## 2018-09-02 MED ORDER — ONDANSETRON HCL 4 MG/2ML IJ SOLN
INTRAMUSCULAR | Status: DC | PRN
  Administered 2018-09-02: 4 mg via INTRAVENOUS

## 2018-09-02 MED ORDER — ONDANSETRON HCL 4 MG/2ML IJ SOLN
INTRAMUSCULAR | Status: AC
  Filled 2018-09-02: qty 2

## 2018-09-02 MED ORDER — PROPOFOL 10 MG/ML IV BOLUS
INTRAVENOUS | Status: DC | PRN
  Administered 2018-09-02: 200 mg via INTRAVENOUS

## 2018-09-02 MED ORDER — PROPOFOL 10 MG/ML IV BOLUS
INTRAVENOUS | Status: AC
  Filled 2018-09-02: qty 20

## 2018-09-02 MED ORDER — LIDOCAINE HCL (CARDIAC) PF 100 MG/5ML IV SOSY
PREFILLED_SYRINGE | INTRAVENOUS | Status: AC
  Filled 2018-09-02: qty 5

## 2018-09-02 MED ORDER — FENTANYL CITRATE (PF) 100 MCG/2ML IJ SOLN
INTRAMUSCULAR | Status: DC | PRN
  Administered 2018-09-02: 100 ug via INTRAVENOUS

## 2018-09-02 MED ORDER — IBUPROFEN 400 MG PO TABS: 400.0000 mg | ORAL_TABLET | Freq: Four times a day (QID) | ORAL | 0 refills | Status: DC | PRN

## 2018-09-02 SURGICAL SUPPLY — 17 items
CANISTER SUCT 3000ML PPV (MISCELLANEOUS) ×3 IMPLANT
CATH ROBINSON RED A/P 16FR (CATHETERS) ×3 IMPLANT
DEVICE MYOSURE LITE (MISCELLANEOUS) ×2 IMPLANT
GLOVE BIOGEL M 6.5 STRL (GLOVE) ×6 IMPLANT
GLOVE BIOGEL PI IND STRL 6.5 (GLOVE) ×2 IMPLANT
GLOVE BIOGEL PI IND STRL 7.0 (GLOVE) ×2 IMPLANT
GLOVE BIOGEL PI INDICATOR 6.5 (GLOVE) ×1
GLOVE BIOGEL PI INDICATOR 7.0 (GLOVE) ×1
GLOVE SURG SS PI 7.0 STRL IVOR (GLOVE) ×6 IMPLANT
GOWN STRL REUS W/TWL LRG LVL3 (GOWN DISPOSABLE) ×6 IMPLANT
PACK VAGINAL MINOR WOMEN LF (CUSTOM PROCEDURE TRAY) ×3 IMPLANT
PAD OB MATERNITY 4.3X12.25 (PERSONAL CARE ITEMS) ×3 IMPLANT
PAD PREP 24X48 CUFFED NSTRL (MISCELLANEOUS) ×3 IMPLANT
SEAL ROD LENS SCOPE MYOSURE (ABLATOR) ×2 IMPLANT
TOWEL OR 17X24 6PK STRL BLUE (TOWEL DISPOSABLE) ×6 IMPLANT
TUBING AQUILEX INFLOW (TUBING) ×3 IMPLANT
TUBING AQUILEX OUTFLOW (TUBING) ×3 IMPLANT

## 2018-09-02 NOTE — H&P (Signed)
CC: Postmenopausal bleeding.   History of Present Illness  General:  57 y/o presents for preop visit in preparation for hysteroscopy D&C and possible polypectomy. to evaluate postmenopausal bleeidng. her ultrasound 06/20/2018 reveals a 9.7 cm x 6.4 cm x 5.8 cm uterues. the endometrium is 1.1 cm..possible hyperechoic mass 0.8 cm. no blood flow noted. Her ovaries appear normal bilaterally. she reports light bleeding for 4 weeks. changing a pad once a day.  she has end stage renal disease and undergoes dialysis 3 days a week.   Current Medications  Taking   Auryxia(Ferric Citrate) 1 GM 210 MG(Fe) Tablet 2 tablets with meals Orally Three times a day, Notes: [facility]   Rena-Vite - Tablet 1 tablet Orally Once a day, Notes: [facility]   Provera(medroxyPROGESTERone) 10 MG Tablet 1 tablet with food Orally Once a day   Amlodipine Besylate 10 MG Tablet 1 tablet Orally Once a day   Medication List reviewed and reconciled with the patient    Past Medical History  HTN.   DM2.   Stage V CKD.    Surgical History  3 c-sections [phone]  Left shoulder 2001  AV fistula in right arm 2019  right arm fistular superficialization 04/2018   Family History  Father: deceased 30 yrs, heart attack  Mother: deceased 73 yrs, thinks she had an allergic reaction  Daughter(s): alive, 45, 20  Son(s): alive 67 yrs, diagnosed with CHF (congestive heart failure)  1 son(s) , 2 daughter(s) - healthy.   Has siblings but doesn\'t talk to them\/neg family hx of colon cancer\/polyps or liver disease.   Social History  General:  no Alcohol. Children: 2 daughters, 1 son. Caffeine: yes, rare. DIET: "Kinda sorta" keto paleo. Tobacco use cigarettes: Former smoker Smoked for 10 years, Quit in year 1998, Tobacco history last updated 07/04/2018. DENTAL CARE: lacking. Marital Status: Divorced. no Recreational drug use. OCCUPATION: employed, Architectural technologist. Exercise: yes, Treadmill 3-4x a week.     Gyn History  Sexual activity not currently sexually active.  Periods : irregular.  LMP pt unsure.  Birth control none.  Last pap smear date 05/25/18-neg - endometrial cells present.  Last mammogram date 02/2018 normal per pt .    OB History  Number of pregnancies 3.  Pregnancy # 1 live birth, C-section delivery, boy.  Pregnancy # 2 live birth, C-section, girl.  Pregnancy # 3 live birth, C-section, girl.    Allergies  Compazine: "makes me look deformed" - Allergy  Voixx: internal bleeding - Allergy   Hospitalization/Major Diagnostic Procedure  SOB 07/2017  High BP 09/2017  AV fistula 01/2018   Review of Systems  CONSTITUTIONAL:  Chills No. Fatigue No. Fever No. Night sweats No. Recent travel outside [place] No. Sweats No. Weight change No.  OPHTHALMOLOGY:  Blurring of vision no. Change in vision no. Double vision no.  ENT:  Dizziness no. Nose bleeds no. Sore throat no. Teeth pain no.  ALLERGY:  Hives no.  CARDIOLOGY:  Chest pain no. High blood pressure no. Irregular heart beat no. Leg edema no. Palpitations no.  RESPIRATORY:  Shortness of breath no. Cough no. Wheezing no.  UROLOGY:  Pain with urination no. Urinary urgency no. Urinary frequency no. Urinary incontinence no. Difficulty urinating No. Blood in urine No.  GASTROENTEROLOGY:  Abdominal pain no. Appetite change no. Bloating/belching no. Blood in stool or on toilet paper no. Change in bowel movements no. Constipation no. Diarrhea no. Difficulty swallowing no. Nausea no.  FEMALE REPRODUCTIVE:  Vulvar pain no. Vulvar rash  no. Abnormal vaginal bleeding yes. Breast pain no. Nipple discharge no. Pain with intercourse no. Pelvic pain no. Unusual vaginal discharge no. Vaginal itching no.  MUSCULOSKELETAL:  Muscle aches no.  NEUROLOGY:  Headache no. Tingling/numbness no. Weakness no.  PSYCHOLOGY:  Depression no. Anxiety no. Nervousness no. Sleep disturbances no. Suicidal ideation no .  ENDOCRINOLOGY:  Excessive thirst no.  Excessive urination no. Hair loss no. Heat or cold intolerance no.  HEMATOLOGY/LYMPH:  Abnormal bleeding no. Easy bruising no. Swollen glands no.  DERMATOLOGY:  New/changing skin lesion no. Rash no. Sores no.  Negative except as stated in HPI.      Vital Signs  Wt 256, Wt change 1 lb, Ht 62.5, BMI 46.07, Pulse sitting 71, BP sitting 140/80.   Examination  General Examination: CONSTITUTIONAL: alert, oriented, NAD . SKIN: moist, warm. EYES: Conjunctiva clear. LUNGS: clear to auscultation bilaterally. HEART: regular rate and rhythm. ABDOMEN: soft, non-tender/non-distended, bowel sounds present . FEMALE GENITOURINARY: normal external genitalia, labia - unremarkable, vagina - pink moist mucosa, no lesions or abnormal discharge, cervix - no discharge or lesions or CMT, adnexa - no masses or tenderness, uterus - nontender and normal size on palpation . PSYCH: affect normal, good eye contact.     Assessments   1. Postmenopausal bleeding - N95.0 (Primary)   2. Type 2 diabetes mellitus with diabetic chronic kidney disease - E11.22   3. Endometrial thickening on ultrasound - R93.89   Treatment  1. Postmenopausal bleeding  Notes: plan hystereoscopy D&C possible polypectomy to rule out hyperplasia or malignancy. . . rb/a of surgery discussed with the patient including but not limited to infection bleeding perforation of the uterus with the need for further surgery. pt voiced understanding and desires to proceed... surgical clearance has been recieved from her pcp and nephrologist .    2. Type 2 diabetes mellitus with diabetic chronic kidney disease  LAB: Hemoglobin A1c Notes: pt is not currently on medication and states that it is diet controlled. last hgbA1c was in 2018 and was 8.    Procedures  Venipuncture:  Venipuncture: Howell,Kathleen 07/04/2018 12:08:21 PM > performed in right arm.        Labs    Lab: Hemoglobin A1c  HGB A1C 7.4 H 4.8-5.6 - %  Estimated Ave Glucose 166  - mg/dL

## 2018-09-02 NOTE — Transfer of Care (Signed)
Immediate Anesthesia Transfer of Care Note  Patient: Pamela Campbell  Procedure(s) Performed: DILATATION & CURETTAGE/HYSTEROSCOPY WITH MYOSURE (N/A Uterus)  Patient Location: PACU  Anesthesia Type:General  Level of Consciousness: awake, alert  and oriented  Airway & Oxygen Therapy: Patient Spontanous Breathing and Patient connected to nasal cannula oxygen  Post-op Assessment: Report given to RN, Post -op Vital signs reviewed and stable and Patient moving all extremities  Post vital signs: Reviewed and stable  Last Vitals:  Vitals Value Taken Time  BP 158/83 09/02/2018 12:15 PM  Temp    Pulse 81 09/02/2018 12:18 PM  Resp 19 09/02/2018 12:18 PM  SpO2 97 % 09/02/2018 12:18 PM  Vitals shown include unvalidated device data.  Last Pain:  Vitals:   09/02/18 0919  TempSrc: Oral  PainSc: 0-No pain      Patients Stated Pain Goal: 3 (87/68/11 5726)  Complications: No apparent anesthesia complications

## 2018-09-02 NOTE — Anesthesia Preprocedure Evaluation (Addendum)
Anesthesia Evaluation  Patient identified by MRN, date of birth, ID band Patient awake    Reviewed: Allergy & Precautions, NPO status , Patient's Chart, lab work & pertinent test results  Airway Mallampati: I  TM Distance: >3 FB Neck ROM: Full    Dental no notable dental hx. (+) Teeth Intact, Dental Advisory Given   Pulmonary former smoker,    Pulmonary exam normal breath sounds clear to auscultation       Cardiovascular hypertension, Pt. on medications + CAD and +CHF  Normal cardiovascular exam Rhythm:Regular Rate:Normal  TTE 12/2017 - Left ventricle: The cavity size was normal. There was mild concentric hypertrophy. Systolic function was normal. The estimated ejection fraction was in the range of 60% to 65%. Wall motion was normal; there were no regional wall motion abnormalities. There was an increased relative contribution of atrial contraction to ventricular filling. Doppler parameters are consistent with abnormal left ventricular relaxation (grade 1 diastolic dysfunction). - Aortic valve: Trileaflet; mildly thickened, mildly calcified   leaflets. - Mitral valve: There was trivial regurgitation. - Left atrium: The atrium was mildly dilated. - Pulmonary arteries: Systolic pressure could not be accurately estimated.    Neuro/Psych negative neurological ROS  negative psych ROS   GI/Hepatic negative GI ROS, Neg liver ROS,   Endo/Other  diabetes (diet controlled), Well Controlled  Renal/GU ESRF and DialysisRenal diseaseLast dialysis yesterday  negative genitourinary   Musculoskeletal negative musculoskeletal ROS (+)   Abdominal   Peds  Hematology  (+) anemia ,   Anesthesia Other Findings Postmenopausal bleeding  Reproductive/Obstetrics                            Anesthesia Physical Anesthesia Plan  ASA: III  Anesthesia Plan: General   Post-op Pain Management:    Induction:  Intravenous  PONV Risk Score and Plan: 3 and Ondansetron, Dexamethasone and Midazolam  Airway Management Planned: LMA  Additional Equipment:   Intra-op Plan:   Post-operative Plan: Extubation in OR  Informed Consent: I have reviewed the patients History and Physical, chart, labs and discussed the procedure including the risks, benefits and alternatives for the proposed anesthesia with the patient or authorized representative who has indicated his/her understanding and acceptance.   Dental advisory given  Plan Discussed with: CRNA  Anesthesia Plan Comments:         Anesthesia Quick Evaluation

## 2018-09-02 NOTE — Discharge Instructions (Signed)

## 2018-09-02 NOTE — H&P (Signed)
Date of Initial H&P: 09/02/2018  History reviewed, patient examined, no change in status, stable for surgery.

## 2018-09-02 NOTE — Op Note (Signed)
09/02/2018  12:22 PM  PATIENT:  Pamela Campbell  57 y.o. female  PRE-OPERATIVE DIAGNOSIS:  N95.0 postmenopausal bleeding  POST-OPERATIVE DIAGNOSIS:  N95.0 postmenopausal bleeding  PROCEDURE:  Procedure(s): DILATATION & CURETTAGE/HYSTEROSCOPY WITH MYOSURE (N/A)  SURGEON:  Surgeon(s) and Role:    Christophe Louis, MD - Primary  PHYSICIAN ASSISTANT:   ASSISTANTS: none   ANESTHESIA:   general  EBL:  5 mL   BLOOD ADMINISTERED:none  DRAINS: none   LOCAL MEDICATIONS USED:  MARCAINE     SPECIMEN:  Source of Specimen:  endometrial currettings and polyps   DISPOSITION OF SPECIMEN:  PATHOLOGY  COUNTS:  YES  TOURNIQUET:  * No tourniquets in log *  DICTATION: .Dragon Dictation  PLAN OF CARE: Discharge to home after PACU  PATIENT DISPOSITION:  PACU - hemodynamically stable.   Delay start of Pharmacological VTE agent (>24hrs) due to surgical blood loss or risk of bleeding: not applicable  Findings: Normal external genitalia. Normal vagina and cervix. 3 endometrial polyps noted..   Procedure: Patient was taken to the operating room where she was placed under general anesthesia. She was placed in the dorsal lithotomy position. She was prepped and draped in the usual sterile fashion. A speculum was placed into the vaginal vault. The anterior lip of the cervix was grasped with a single-tooth tenaculum. Quarter percent Marcaine was injected at the 4 and 8:00 positions of the cervix. The cervix was then sounded to 6 cm. The cervix was dilated to approximately 6 mm. Mysosure operative  hysteroscope was inserted. The findings noted above. Myosure lite  blade was introduced throught the hysteroscope. The endometrial polyps were removed in 5 minute.  There was no evidence of perforation. Hysteroscope was then removed. Sharp curette was inserted and endometrial curettings were obtained. .  The single-tooth tenaculum was removed from the anterior lip of the cervix The speculum was removed from the  patient's vagina. She was awakened from anesthesia taken care  To the recovery  room awake and in stable condition. Sponge lap and needle counts were correct x2.

## 2018-09-02 NOTE — Anesthesia Postprocedure Evaluation (Signed)
Anesthesia Post Note  Patient: Pamela Campbell  Procedure(s) Performed: DILATATION & CURETTAGE/HYSTEROSCOPY WITH MYOSURE (N/A Uterus)     Patient location during evaluation: PACU Anesthesia Type: General Level of consciousness: awake and alert Pain management: pain level controlled Vital Signs Assessment: post-procedure vital signs reviewed and stable Respiratory status: spontaneous breathing, nonlabored ventilation, respiratory function stable and patient connected to nasal cannula oxygen Cardiovascular status: blood pressure returned to baseline and stable Postop Assessment: no apparent nausea or vomiting Anesthetic complications: no    Last Vitals:  Vitals:   09/02/18 1345 09/02/18 1438  BP:  (!) 158/74  Pulse: 80 77  Resp: (!) 21 16  Temp: 37.3 C 36.8 C  SpO2: 93% 100%    Last Pain:  Vitals:   09/02/18 1438  TempSrc:   PainSc: 0-No pain   Pain Goal: Patients Stated Pain Goal: 3 (09/02/18 1300)               Macie Baum L Press [place]

## 2018-09-02 NOTE — Anesthesia Procedure Notes (Signed)
Procedure Name: LMA Insertion Date/Time: 09/02/2018 11:36 AM Performed by: Hewitt Blade, CRNA Pre-anesthesia Checklist: Patient identified, Emergency Drugs available, Suction available and Patient being monitored Patient Re-evaluated:Patient Re-evaluated prior to induction Oxygen Delivery Method: Circle system utilized Preoxygenation: Pre-oxygenation with 100% oxygen Induction Type: IV induction LMA: LMA inserted LMA Size: 4.0 Number of attempts: 1 Placement Confirmation: positive ETCO2 and breath sounds checked- equal and bilateral Tube secured with: Tape Dental Injury: Teeth and Oropharynx as per pre-operative assessment

## 2018-09-03 ENCOUNTER — Encounter (HOSPITAL_COMMUNITY): Payer: Self-pay | Admitting: Obstetrics and Gynecology

## 2018-11-01 ENCOUNTER — Encounter (INDEPENDENT_AMBULATORY_CARE_PROVIDER_SITE_OTHER): Payer: Self-pay

## 2018-11-14 ENCOUNTER — Encounter (INDEPENDENT_AMBULATORY_CARE_PROVIDER_SITE_OTHER): Payer: Self-pay | Admitting: Bariatrics

## 2018-11-14 ENCOUNTER — Ambulatory Visit (INDEPENDENT_AMBULATORY_CARE_PROVIDER_SITE_OTHER): Payer: Managed Care, Other (non HMO) | Admitting: Bariatrics

## 2018-11-14 VITALS — BP 176/98 | HR 79 | Temp 98.0°F | Ht 63.0 in | Wt 258.0 lb

## 2018-11-14 DIAGNOSIS — N186 End stage renal disease: Secondary | ICD-10-CM

## 2018-11-14 DIAGNOSIS — E1169 Type 2 diabetes mellitus with other specified complication: Secondary | ICD-10-CM

## 2018-11-14 DIAGNOSIS — I5032 Chronic diastolic (congestive) heart failure: Secondary | ICD-10-CM | POA: Diagnosis not present

## 2018-11-14 DIAGNOSIS — R5383 Other fatigue: Secondary | ICD-10-CM

## 2018-11-14 DIAGNOSIS — Z1331 Encounter for screening for depression: Secondary | ICD-10-CM

## 2018-11-14 DIAGNOSIS — Z6841 Body Mass Index (BMI) 40.0 and over, adult: Secondary | ICD-10-CM

## 2018-11-14 DIAGNOSIS — Z9189 Other specified personal risk factors, not elsewhere classified: Secondary | ICD-10-CM

## 2018-11-14 DIAGNOSIS — I10 Essential (primary) hypertension: Secondary | ICD-10-CM | POA: Diagnosis not present

## 2018-11-14 DIAGNOSIS — R0602 Shortness of breath: Secondary | ICD-10-CM

## 2018-11-14 DIAGNOSIS — Z992 Dependence on renal dialysis: Secondary | ICD-10-CM

## 2018-11-14 DIAGNOSIS — Z0289 Encounter for other administrative examinations: Secondary | ICD-10-CM

## 2018-11-14 DIAGNOSIS — E669 Obesity, unspecified: Secondary | ICD-10-CM

## 2018-11-15 LAB — TSH: TSH: 1.92 u[IU]/mL (ref 0.450–4.500)

## 2018-11-15 LAB — CBC WITH DIFFERENTIAL/PLATELET
BASOS: 1 %
Basophils Absolute: 0.1 10*3/uL (ref 0.0–0.2)
EOS (ABSOLUTE): 0.3 10*3/uL (ref 0.0–0.4)
Eos: 2 %
HEMATOCRIT: 36.1 % (ref 34.0–46.6)
HEMOGLOBIN: 12 g/dL (ref 11.1–15.9)
IMMATURE GRANS (ABS): 0 10*3/uL (ref 0.0–0.1)
Immature Granulocytes: 0 %
LYMPHS: 29 %
Lymphocytes Absolute: 3.5 10*3/uL — ABNORMAL HIGH (ref 0.7–3.1)
MCH: 28.6 pg (ref 26.6–33.0)
MCHC: 33.2 g/dL (ref 31.5–35.7)
MCV: 86 fL (ref 79–97)
MONOCYTES: 8 %
Monocytes Absolute: 0.9 10*3/uL (ref 0.1–0.9)
NEUTROS ABS: 7.5 10*3/uL — AB (ref 1.4–7.0)
Neutrophils: 60 %
PLATELETS: 299 10*3/uL (ref 150–450)
RBC: 4.2 x10E6/uL (ref 3.77–5.28)
RDW: 13.8 % (ref 12.3–15.4)
WBC: 12.2 10*3/uL — ABNORMAL HIGH (ref 3.4–10.8)

## 2018-11-15 LAB — COMPREHENSIVE METABOLIC PANEL
A/G RATIO: 1.3 (ref 1.2–2.2)
ALT: 13 IU/L (ref 0–32)
AST: 17 IU/L (ref 0–40)
Albumin: 4.2 g/dL (ref 3.5–5.5)
Alkaline Phosphatase: 116 IU/L (ref 39–117)
BILIRUBIN TOTAL: 0.3 mg/dL (ref 0.0–1.2)
BUN/Creatinine Ratio: 7 — ABNORMAL LOW (ref 9–23)
BUN: 45 mg/dL — ABNORMAL HIGH (ref 6–24)
CHLORIDE: 94 mmol/L — AB (ref 96–106)
CO2: 26 mmol/L (ref 20–29)
Calcium: 9.7 mg/dL (ref 8.7–10.2)
Creatinine, Ser: 6.62 mg/dL — ABNORMAL HIGH (ref 0.57–1.00)
GFR, EST AFRICAN AMERICAN: 7 mL/min/{1.73_m2} — AB (ref >59)
GFR, EST NON AFRICAN AMERICAN: 6 mL/min/{1.73_m2} — AB (ref >59)
GLOBULIN, TOTAL: 3.3 g/dL (ref 1.5–4.5)
Glucose: 132 mg/dL — ABNORMAL HIGH (ref 65–99)
POTASSIUM: 4.4 mmol/L (ref 3.5–5.2)
SODIUM: 139 mmol/L (ref 134–144)
TOTAL PROTEIN: 7.5 g/dL (ref 6.0–8.5)

## 2018-11-15 LAB — VITAMIN B12: VITAMIN B 12: 1332 pg/mL — AB (ref 232–1245)

## 2018-11-15 LAB — VITAMIN D 25 HYDROXY (VIT D DEFICIENCY, FRACTURES): VIT D 25 HYDROXY: 19.5 ng/mL — AB (ref 30.0–100.0)

## 2018-11-15 LAB — INSULIN, RANDOM: INSULIN: 9.9 u[IU]/mL (ref 2.6–24.9)

## 2018-11-15 LAB — FOLATE: Folate: >20 ng/mL (ref >3.0)

## 2018-11-15 LAB — T4, FREE: Free T4: 1.16 ng/dL (ref 0.82–1.77)

## 2018-11-15 LAB — T3: T3 TOTAL: 104 ng/dL (ref 71–180)

## 2018-11-21 ENCOUNTER — Encounter (INDEPENDENT_AMBULATORY_CARE_PROVIDER_SITE_OTHER): Payer: Self-pay | Admitting: Bariatrics

## 2018-11-21 NOTE — Progress Notes (Signed)
Office: [phone]  /  Fax: [phone]   Dear Dr. Moshe Campbell,   Thank you for referring Pamela Campbell to our clinic. The following note includes my evaluation and treatment recommendations.  HPI:   Chief Complaint: OBESITY    Pamela Campbell has been referred by Pamela Meckel, MD for consultation regarding her obesity and obesity related comorbidities.    Pamela Campbell (MR# [id number]) is a 57 y.o. female who presents on 11/21/2018 for obesity evaluation and treatment. Current BMI is Body mass index is 45.7 kg/m.Pamela Campbell Kitchen Pamela Campbell has been struggling with her weight for many years and has been unsuccessful in either losing weight, maintaining weight loss, or reaching her healthy weight goal.     Pamela Campbell attended our information session and states she is currently in the action stage of change and ready to dedicate time achieving and maintaining a healthier weight. Pamela Campbell is interested in becoming our patient and working on intensive lifestyle modifications including (but not limited to) diet, exercise and weight loss.    Pamela Campbell states her desired weight loss is 108 lbs she started gaining weight in 1997 her heaviest weight ever was 295 lbs. she has significant food cravings issues  she always skips breakfast she frequently eats larger portions than normal  she has binge eating behaviors she struggles with cooking secondary to dialysis treatment   Fatigue Pamela Campbell feels her energy is lower than it should be. This has worsened with weight gain and has not worsened recently. Pamela Campbell admits to daytime somnolence and she admits to waking up still tired. She denies sleep apnea. Patent has a history of symptoms of daytime fatigue, morning fatigue and hypertension. Patient generally gets 7 or 8 hours of sleep per night, and states they generally have restful sleep. Snoring is not present. Apneic episodes are not present. Epworth Sleepiness Score is 5  Dyspnea on exertion Pamela Campbell notes  increasing shortness of breath with exercising and seems to be worsening over time with weight gain. She notes getting out of breath sooner with certain activities (stairs, walking up hill)  than she used to. This has not gotten worse recently. Pamela Campbell denies orthopnea.  Heart Failure, chronic, diastolic; Grade I Pamela Campbell is not seeing a cardiologist and she was last seen several years ago. Echocardiogram done on 12/31/17 showed mild cocentric hypertrophy with ejection fraction of 60 to 65%.  Hypertension Pamela Campbell is a 57 y.o. female with hypertension. She is currently taking Norvasc. She states her blood pressure is very low when she goes to dialysis per patient. Pamela Campbell denies chest pain or shortness of breath on exertion. She is working weight loss to help control her blood pressure with the goal of decreasing her risk of heart attack and stroke. Pamela Campbell blood pressure is not currently controlled.  ESRD on dialysis (end stage renal disease) Pamela Campbell has uncontrolled hypertension. Pamela Campbell sees her nephrologist and she goes to dialysis three times per week. She has fluid retention of one liter.  Diabetes II Pamela Campbell has a diagnosis of diabetes type II. She had been on insulin initially, but she is on no medications at this time. She is seen at [facility] by Dr. Lynelle Campbell. Pamela Campbell denies any hypoglycemic episodes. She is attempting to work on intensive lifestyle modifications including diet, exercise, and weight loss to help control her blood glucose levels.  Depression Screen Pamela Campbell's Food and Mood (modified PHQ-9) score was  Depression screen PHQ 2/9 11/14/2018  Decreased Interest 1  Down, Depressed, Hopeless 0  PHQ -  2 Score 1  Altered sleeping 0  Tired, decreased energy 3  Change in appetite 0  Feeling bad or failure about yourself  0  Trouble concentrating 0  Moving slowly or fidgety/restless 0  Suicidal thoughts 0  PHQ-9 Score 4  Difficult doing work/chores Not difficult at all     ALLERGIES: Allergies  Allergen Reactions  . Compazine [Prochlorperazine Edisylate] Swelling    Tongue  Hardens and comes out of mouth  . Vioxx [Rofecoxib] Other (See Comments)    Bleeding    MEDICATIONS: Current Outpatient Medications on File Prior to Visit  Medication Sig Dispense Refill  . amLODipine (NORVASC) 10 MG tablet Take 1 tablet (10 mg total) by mouth daily. 30 tablet 1  . b complex-vitamin c-folic acid (NEPHRO-VITE) 0.8 MG TABS tablet Take 1 tablet by mouth daily.     . Coenzyme Q10 100 MG CPCR Take by mouth.    . ondansetron (ZOFRAN-ODT) 4 MG disintegrating tablet Take 4 mg by mouth every 6 (six) hours as needed for nausea.  0  . ferric citrate (AURYXIA) 1 GM 210 MG(Fe) tablet Take 420 mg by mouth 3 (three) times daily with meals.    Pamela Campbell Kitchen ibuprofen (ADVIL,MOTRIN) 400 MG tablet Take 1 tablet (400 mg total) by mouth every 6 (six) hours as needed. (Patient not taking: Reported on 11/14/2018) 12 tablet 0  . oxyCODONE (OXY IR/ROXICODONE) 5 MG immediate release tablet Take 1 tablet (5 mg total) by mouth every 6 (six) hours as needed for severe pain. (Patient not taking: Reported on 11/14/2018) 10 tablet 0   No current facility-administered medications on file prior to visit.     PAST MEDICAL HISTORY: Past Medical History:  Diagnosis Date  . Anemia   . Chronic diastolic CHF (congestive heart failure) ([place])    a. 03/2015: echo w/ EF of 50-55%, no WMA, Grade 2 DD, trivial AI, mild MR.  . Chronic kidney disease (CKD), stage V ([place])    dialysis tuesday, thursday saterday  . Constipation   . Diabetes mellitus without complication (HCC)    CONTROLLED WITH DIET  . Dyspnea    non lately  . Edema, lower extremity   . Hypertension   . Overweight   . Pneumonia 2016   WHILE HOSPIATLIZED  . Shortness of breath     PAST SURGICAL HISTORY: Past Surgical History:  Procedure Laterality Date  . AV FISTULA PLACEMENT Right 01/04/2018   Procedure: ARTERIOVENOUS (AV) FISTULA CREATION  RIGHT ARM;  Surgeon: Conrad Eden, MD;  Location: [place];  Service: Vascular;  Laterality: Right;  . AV FISTULA PLACEMENT Right 01/24/2018   Procedure: ARTERIOVENOUS (AV) FISTULA CREATION RIGHT BRACHIOCEPHALIC;  Surgeon: Conrad Cable, MD;  Location: [place];  Service: Vascular;  Laterality: Right;  . CARDIAC CATHETERIZATION N/A 11/02/2016   Procedure: Left Heart Cath and Coronary Angiography;  Surgeon: Sherren Mocha, MD;  Location: [place] [facility];  Service: Cardiovascular;  Laterality: N/A;  . CESAREAN SECTION    . COLONOSCOPY    . DILATATION & CURETTAGE/HYSTEROSCOPY WITH MYOSURE N/A 09/02/2018   Procedure: DILATATION & CURETTAGE/HYSTEROSCOPY WITH MYOSURE;  Surgeon: Christophe Louis, MD;  Location: [facility];  Service: Gynecology;  Laterality: N/A;  . FISTULA PLUG    . FISTULA SUPERFICIALIZATION Right 05/02/2018   Procedure: FISTULA SUPERFICIALIZATION RIGHT ARTERIOVENOUS FISTULA;  Surgeon: Conrad Roff, MD;  Location: [place];  Service: Vascular;  Laterality: Right;  . FRACTURE SURGERY     shoulder left  . INSERTION OF DIALYSIS CATHETER Right  01/04/2018   Procedure: INSERTION OF DIALYSIS CATHETER RIGHT INTERNAL JUGULAR;  Surgeon: Conrad Groveton, MD;  Location: [place];  Service: Vascular;  Laterality: Right;  . IR FLUORO GUIDE CV LINE RIGHT  12/31/2017  . IR US GUIDE VASC ACCESS RIGHT  12/31/2017  . left shoulder rotator    . LIGATION OF ARTERIOVENOUS  FISTULA Right 01/24/2018   Procedure: LIGATION OF  RIGHT RADIOCEPHALIC ARTERIOVENOUS  FISTULA;  Surgeon: Conrad Jasper, MD;  Location: [place];  Service: Vascular;  Laterality: Right;  . TONSILECTOMY, ADENOIDECTOMY, BILATERAL MYRINGOTOMY AND TUBES    . TOOTH EXTRACTION    . TUBAL LIGATION      SOCIAL HISTORY: Social History   Tobacco Use  . Smoking status: Former Smoker    Last attempt to quit: 03/21/1998    Years since quitting: 20.6  . Smokeless tobacco: Never Used  Substance Use Topics  . Alcohol use: No  . Drug use: No    FAMILY HISTORY: Family  History  Problem Relation Age of Onset  . Diabetes Mellitus II Mother   . Hypertension Mother   . Hyperlipidemia Mother   . Kidney disease Mother   . Eating disorder Mother   . Obesity Mother   . Heart disease Father   . Heart attack Father 38  . Congestive Heart Failure Brother   . Congestive Heart Failure Son     ROS: Review of Systems  Constitutional: Positive for malaise/fatigue.  Respiratory: Positive for shortness of breath (on exertion).   Cardiovascular: Negative for chest pain.  Gastrointestinal: Positive for constipation and nausea.  Skin: Positive for rash.       + Dryness    PHYSICAL EXAM: Blood pressure (!) 176/98, pulse 79, temperature 98 F (36.7 C), temperature source Oral, height 5\' 3"  (1.6 m), weight 258 lb (117 kg), last menstrual period 02/02/2015, SpO2 98 %. Body mass index is 45.7 kg/m. Physical Exam  Constitutional: She is oriented to person, place, and time. She appears well-developed and well-nourished.  HENT:  Head: Normocephalic and atraumatic.  Nose: Nose normal.  Mallampati = 4  Eyes: EOM are normal. No scleral icterus.  Neck: Normal range of motion. Neck supple. No thyromegaly present.  Cardiovascular: Normal rate and regular rhythm.  Pulmonary/Chest: Effort normal. No respiratory distress.  Abdominal: Soft. There is no tenderness.  + Obesity  Musculoskeletal: Normal range of motion. She exhibits edema (trace edema noted in bilateral lower extremities).  Range of Motion normal in all 4 extremities  Neurological: She is alert and oriented to person, place, and time. Coordination normal.  Skin: Skin is warm and dry.  Psychiatric: She has a normal mood and affect.  Vitals reviewed.   RECENT LABS AND TESTS: BMET    Component Value Date/Time   NA 139 11/14/2018 1107   K 4.4 11/14/2018 1107   CL 94 (L) 11/14/2018 1107   CO2 26 11/14/2018 1107   GLUCOSE 132 (H) 11/14/2018 1107   GLUCOSE 131 (H) 09/02/2018 0855   BUN 45 (H) 11/14/2018  1107   CREATININE 6.62 (H) 11/14/2018 1107   CREATININE 2.18 (H) 12/03/2016 1141   CALCIUM 9.7 11/14/2018 1107   GFRNONAA 6 (L) 11/14/2018 1107   GFRNONAA 25 (L) 12/03/2016 1141   GFRAA 7 (L) 11/14/2018 1107   GFRAA 29 (L) 12/03/2016 1141   Lab Results  Component Value Date   HGBA1C 7.4 (H) 12/30/2017   Lab Results  Component Value Date   INSULIN 9.9 11/14/2018   CBC  Component Value Date/Time   WBC 12.2 (H) 11/14/2018 1107   WBC 11.0 (H) 09/02/2018 0855   RBC 4.20 11/14/2018 1107   RBC 4.13 09/02/2018 0855   HGB 12.0 11/14/2018 1107   HCT 36.1 11/14/2018 1107   PLT 299 11/14/2018 1107   MCV 86 11/14/2018 1107   MCH 28.6 11/14/2018 1107   MCH 29.3 09/02/2018 0855   MCHC 33.2 11/14/2018 1107   MCHC 31.6 09/02/2018 0855   RDW 13.8 11/14/2018 1107   LYMPHSABS 3.5 (H) 11/14/2018 1107   MONOABS 1.0 12/30/2017 1400   EOSABS 0.3 11/14/2018 1107   BASOSABS 0.1 11/14/2018 1107   Iron/TIBC/Ferritin/ %Sat    Component Value Date/Time   IRON 33 12/31/2017 1913   TIBC 235 (L) 12/31/2017 1913   FERRITIN 167 12/31/2017 1913   IRONPCTSAT 14 12/31/2017 1913   Lipid Panel     Component Value Date/Time   CHOL 323 (H) 10/24/2016 1822   TRIG 236 (H) 10/24/2016 1822   HDL 70 10/24/2016 1822   CHOLHDL 4.6 10/24/2016 1822   VLDL 47 (H) 10/24/2016 1822   LDLCALC 206 (H) 10/24/2016 1822   Hepatic Function Panel     Component Value Date/Time   PROT 7.5 11/14/2018 1107   ALBUMIN 4.2 11/14/2018 1107   AST 17 11/14/2018 1107   ALT 13 11/14/2018 1107   ALKPHOS 116 11/14/2018 1107   BILITOT 0.3 11/14/2018 1107      Component Value Date/Time   TSH 1.920 11/14/2018 1107   TSH 1.977 12/30/2017 1703   TSH 2.330 10/24/2016 1822    ECG  shows NSR with a rate of 73 BPM INDIRECT CALORIMETER done today shows a VO2 of 364 and a REE of 2534.  Her calculated basal metabolic rate is 9163 thus her basal metabolic rate is better than expected.    ASSESSMENT AND PLAN: Other fatigue -  Plan: EKG 12-Lead, Folate, T3, T4, free, TSH, Vitamin B12, VITAMIN D 25 Hydroxy (Vit-D Deficiency, Fractures)  Shortness of breath on exertion  Acute diastolic heart failure (HCC)  Essential hypertension - Plan: CBC with Differential/Platelet, Comprehensive metabolic panel  ESRD (end stage renal disease) on dialysis ([place])  Diabetes mellitus type 2 in obese ([place]) - Plan: Insulin, random  At risk for obstructive sleep apnea  Screening for depression  Class 3 severe obesity with serious comorbidity and body mass index (BMI) of 45.0 to 49.9 in adult, unspecified obesity type ([place])  PLAN: Fatigue Mackinzie was informed that her fatigue may be related to obesity, depression or many other causes. Labs will be ordered, and in the meanwhile Pamela Campbell has agreed to work on diet, exercise and weight loss to help with fatigue. Proper sleep hygiene was discussed including the need for 7-8 hours of quality sleep each night. We will consider sleep study if any issues.  Dyspnea on exertion Miria's shortness of breath appears to be obesity related and exercise induced. She has agreed to work on weight loss and gradually increase exercise to treat her exercise induced shortness of breath. If Pamela Campbell follows our instructions and loses weight without improvement of her shortness of breath, we will plan to refer to pulmonology. We will monitor this condition regularly. Talaysha agrees to this plan.  Heart Failure, chronic, diastolic; Grade I Pamela Campbell will follow up with her PCP and her PCP will refer back to the cardiologist if needed.  Hypertension We discussed sodium restriction, working on healthy weight loss, and a regular exercise program as the means to achieve improved blood  pressure control. Pamela Campbell agreed with this plan and agreed to follow up as directed. We will continue to monitor her blood pressure as well as her progress with the above lifestyle modifications. Her PCP and Nephrologist will manage  her blood pressure. She will continue Norvasc as prescribed and will watch for signs of hypotension as she continues her lifestyle modifications.  ESRD on dialysis (end stage renal disease) Pamela Campbell will continue to have dialysis three times per week (planning kidney transplant in 1 year) per patient.  Diabetes II Pamela Campbell has been given extensive diabetes education by myself today including ideal fasting and post-prandial blood glucose readings, individual ideal Hgb A1c goals and hypoglycemia prevention. We discussed the importance of good blood sugar control to decrease the likelihood of diabetic complications such as nephropathy, neuropathy, limb loss, blindness, coronary artery disease, and death. We discussed the importance of intensive lifestyle modification including diet, exercise and weight loss as the first line treatment for diabetes. Prescription was written today for glucose meter, glucose strips #100 use 2 daily with no refills and lancets 1 BID (#100 with no refills) Pamela Campbell agrees to follow up at the agreed upon time.  Obstructive Sleep Apnea Risk Counseling Pamela Campbell was given extended (15 minutes) coronary artery disease prevention counseling today. She is 57 y.o. female and has risk factors for obstructive sleep apnea including obesity. We discussed intensive lifestyle modifications today with an emphasis on specific weight loss instructions and strategies.  Depression Screen Pamela Campbell had a negative depression screening. Depression is commonly associated with obesity and often results in emotional eating behaviors. We will monitor this closely and work on CBT to help improve the non-hunger eating patterns.   Obesity Pamela Campbell is currently in the action stage of change and her goal is to continue with weight loss efforts. I recommend Pamela Campbell begin the structured treatment plan as follows:  She has agreed to follow the Category 3 plan modified for dialysis Pamela Campbell has been instructed to  eventually work up to a goal of 150 minutes of combined cardio and strengthening exercise per week for weight loss and overall health benefits. We discussed the following Behavioral Modification Strategies today: increase H2O intake to at least 32 ounces daily, no skipping meals, increasing lean protein intake, decreasing simple carbohydrates , increasing vegetables, decrease eating out, work on meal planning and easy cooking plans and decrease junk food   She was informed of the importance of frequent follow up visits to maximize her success with intensive lifestyle modifications for her multiple health conditions. She was informed we would discuss her lab results at her next visit unless there is a critical issue that needs to be addressed sooner. Charmin agreed to keep her next visit at the agreed upon time to discuss these results.    OBESITY BEHAVIORAL INTERVENTION VISIT  Today's visit was # 1   Starting weight: 258 lbs Starting date: 11/14/2018 Today's weight : 258 lbs Today's date: 11/14/2018 Total lbs lost to date: 0   ASK: We discussed the diagnosis of obesity with Pamela Campbell today and Tahani agreed to give [place] permission to discuss obesity behavioral modification therapy today.  ASSESS: Venora has the diagnosis of obesity and her BMI today is 45.71 Latera is in the action stage of change   ADVISE: Radonna was educated on the multiple health risks of obesity as well as the benefit of weight loss to improve her health. She was advised of the need for long term treatment and the importance of lifestyle modifications  to improve her current health and to decrease her risk of future health problems.  AGREE: Multiple dietary modification options and treatment options were discussed and  Ariyel agreed to follow the recommendations documented in the above note.  ARRANGE: Lyzette was educated on the importance of frequent visits to treat obesity as outlined per CMS and USPSTF  guidelines and agreed to schedule her next follow up appointment today.  Corey Skains, am acting as Location manager for [employer]. Owens Shark, DO  I have reviewed the above documentation for accuracy and completeness, and I agree with the above. -Jearld Lesch, DO

## 2018-11-22 ENCOUNTER — Telehealth (INDEPENDENT_AMBULATORY_CARE_PROVIDER_SITE_OTHER): Payer: Self-pay | Admitting: Bariatrics

## 2018-11-22 ENCOUNTER — Other Ambulatory Visit (INDEPENDENT_AMBULATORY_CARE_PROVIDER_SITE_OTHER): Payer: Self-pay

## 2018-11-22 NOTE — Telephone Encounter (Signed)
Patient called stated she was told supplies to check her sugar would be called into pharmacy. Patient stated she has been checking since 1st appt. & nothing yet. Please let patient know when done. (may leave detailed msg. If no answer)   Thank you

## 2018-11-23 NOTE — Telephone Encounter (Signed)
Called the medication in verbally to the patient pharmacy. Pamela Campbell, [place]

## 2018-11-29 ENCOUNTER — Ambulatory Visit (INDEPENDENT_AMBULATORY_CARE_PROVIDER_SITE_OTHER): Payer: Managed Care, Other (non HMO) | Admitting: Bariatrics

## 2018-11-29 ENCOUNTER — Encounter (INDEPENDENT_AMBULATORY_CARE_PROVIDER_SITE_OTHER): Payer: Self-pay | Admitting: Bariatrics

## 2018-11-29 VITALS — BP 183/75 | HR 80 | Temp 98.8°F | Ht 63.0 in | Wt 259.0 lb

## 2018-11-29 DIAGNOSIS — I10 Essential (primary) hypertension: Secondary | ICD-10-CM | POA: Diagnosis not present

## 2018-11-29 DIAGNOSIS — Z9189 Other specified personal risk factors, not elsewhere classified: Secondary | ICD-10-CM

## 2018-11-29 DIAGNOSIS — N186 End stage renal disease: Secondary | ICD-10-CM | POA: Diagnosis not present

## 2018-11-29 DIAGNOSIS — E119 Type 2 diabetes mellitus without complications: Secondary | ICD-10-CM

## 2018-11-29 DIAGNOSIS — E559 Vitamin D deficiency, unspecified: Secondary | ICD-10-CM

## 2018-11-29 DIAGNOSIS — Z6841 Body Mass Index (BMI) 40.0 and over, adult: Secondary | ICD-10-CM

## 2018-11-30 NOTE — Progress Notes (Signed)
Office: [phone]  /  Fax: [phone]   HPI:   Chief Complaint: OBESITY Pamela Campbell is here to discuss her progress with her obesity treatment plan. She is on the Category 3 plan and is following her eating plan approximately 75 % of the time. She states she is walking 25 to 30 minutes 5 times per week. Pamela Campbell did not struggle with the plan. She liked the simplicity of the plan. Pamela Campbell states that her hunger was controlled. She did not have any unusual cravings. Her weight is 259 lb (117.5 kg) today and has had a weight gain of 1 pound over a period of 2 weeks since her last visit. She has gained 1 lb since starting treatment with [place].  Hypertension Micca L Swartzentruber is a 57 y.o. female with hypertension. She is taking amlodipine 10 mg and her blood pressure today is at 183/75. Kadeisha states that "this is normal for her". Jacklin L Wotton denies chest pain or shortness of breath on exertion. She is working weight loss to help control her blood pressure with the goal of decreasing her risk of heart attack and stroke. Deliskas blood pressure is not well controlled.  ESRD on dialysis (end stage renal disease) Shadell is waiting to get on the lis for kidney transplant. She is on dialysis three times per week and she states that she is on 1 liter of water daily.  Vitamin D deficiency Lima has a diagnosis of vitamin D deficiency. She is not currently taking vit D and her last level was at 19.5. Honour denies nausea, vomiting or muscle weakness.  At risk for osteopenia and osteoporosis Evagelia is at higher risk of osteopenia and osteoporosis due to vitamin D deficiency.   Diabetes II Alexarae has a diagnosis of diabetes type II. Aribelle is not checking her blood sugar. Last A1c was at 7.6 and insulin level was at 9.9 She has been working on intensive lifestyle modifications including diet, exercise, and weight loss to help control her blood glucose levels.  ALLERGIES: Allergies  Allergen  Reactions  . Compazine [Prochlorperazine Edisylate] Swelling    Tongue  Hardens and comes out of mouth  . Vioxx [Rofecoxib] Other (See Comments)    Bleeding    MEDICATIONS: Current Outpatient Medications on File Prior to Visit  Medication Sig Dispense Refill  . amLODipine (NORVASC) 10 MG tablet Take 1 tablet (10 mg total) by mouth daily. 30 tablet 1  . b complex-vitamin c-folic acid (NEPHRO-VITE) 0.8 MG TABS tablet Take 1 tablet by mouth daily.     . Coenzyme Q10 100 MG CPCR Take by mouth.    . ferric citrate (AURYXIA) 1 GM 210 MG(Fe) tablet Take 420 mg by mouth 3 (three) times daily with meals.    Marland Kitchen ibuprofen (ADVIL,MOTRIN) 400 MG tablet Take 1 tablet (400 mg total) by mouth every 6 (six) hours as needed. 12 tablet 0  . ondansetron (ZOFRAN-ODT) 4 MG disintegrating tablet Take 4 mg by mouth every 6 (six) hours as needed for nausea.  0  . oxyCODONE (OXY IR/ROXICODONE) 5 MG immediate release tablet Take 1 tablet (5 mg total) by mouth every 6 (six) hours as needed for severe pain. 10 tablet 0   No current facility-administered medications on file prior to visit.     PAST MEDICAL HISTORY: Past Medical History:  Diagnosis Date  . Anemia   . Chronic diastolic CHF (congestive heart failure) ([place])    a. 03/2015: echo w/ EF of 50-55%, no WMA, Grade 2 DD,  trivial AI, mild MR.  . Chronic kidney disease (CKD), stage V ([place])    dialysis tuesday, thursday saterday  . Constipation   . Diabetes mellitus without complication (HCC)    CONTROLLED WITH DIET  . Dyspnea    non lately  . Edema, lower extremity   . Hypertension   . Overweight   . Pneumonia 2016   WHILE HOSPIATLIZED  . Shortness of breath     PAST SURGICAL HISTORY: Past Surgical History:  Procedure Laterality Date  . AV FISTULA PLACEMENT Right 01/04/2018   Procedure: ARTERIOVENOUS (AV) FISTULA CREATION RIGHT ARM;  Surgeon: Conrad Lake Barrington, MD;  Location: [facility];  Service: Vascular;  Laterality: Right;  . AV FISTULA PLACEMENT Right  01/24/2018   Procedure: ARTERIOVENOUS (AV) FISTULA CREATION RIGHT BRACHIOCEPHALIC;  Surgeon: Conrad Haven, MD;  Location: [facility];  Service: Vascular;  Laterality: Right;  . CARDIAC CATHETERIZATION N/A 11/02/2016   Procedure: Left Heart Cath and Coronary Angiography;  Surgeon: Sherren Mocha, MD;  Location: [facility];  Service: Cardiovascular;  Laterality: N/A;  . CESAREAN SECTION    . COLONOSCOPY    . DILATATION & CURETTAGE/HYSTEROSCOPY WITH MYOSURE N/A 09/02/2018   Procedure: DILATATION & CURETTAGE/HYSTEROSCOPY WITH MYOSURE;  Surgeon: Christophe Louis, MD;  Location: [facility];  Service: Gynecology;  Laterality: N/A;  . FISTULA PLUG    . FISTULA SUPERFICIALIZATION Right 05/02/2018   Procedure: FISTULA SUPERFICIALIZATION RIGHT ARTERIOVENOUS FISTULA;  Surgeon: Conrad Stockton, MD;  Location: [facility];  Service: Vascular;  Laterality: Right;  . FRACTURE SURGERY     shoulder left  . INSERTION OF DIALYSIS CATHETER Right 01/04/2018   Procedure: INSERTION OF DIALYSIS CATHETER RIGHT INTERNAL JUGULAR;  Surgeon: Conrad North Caldwell, MD;  Location: [facility];  Service: Vascular;  Laterality: Right;  . IR FLUORO GUIDE CV LINE RIGHT  12/31/2017  . IR US GUIDE VASC ACCESS RIGHT  12/31/2017  . left shoulder rotator    . LIGATION OF ARTERIOVENOUS  FISTULA Right 01/24/2018   Procedure: LIGATION OF  RIGHT RADIOCEPHALIC ARTERIOVENOUS  FISTULA;  Surgeon: Conrad Nassau, MD;  Location: [facility];  Service: Vascular;  Laterality: Right;  . TONSILECTOMY, ADENOIDECTOMY, BILATERAL MYRINGOTOMY AND TUBES    . TOOTH EXTRACTION    . TUBAL LIGATION      SOCIAL HISTORY: Social History   Tobacco Use  . Smoking status: Former Smoker    Last attempt to quit: 03/21/1998    Years since quitting: 20.7  . Smokeless tobacco: Never Used  Substance Use Topics  . Alcohol use: No  . Drug use: No    FAMILY HISTORY: Family History  Problem Relation Age of Onset  . Diabetes Mellitus II Mother   . Hypertension Mother   . Hyperlipidemia Mother   .  Kidney disease Mother   . Eating disorder Mother   . Obesity Mother   . Heart disease Father   . Heart attack Father 78  . Congestive Heart Failure Brother   . Congestive Heart Failure Son     ROS: Review of Systems  Constitutional: Negative for weight loss.  Respiratory: Negative for shortness of breath (on exertion).   Cardiovascular: Negative for chest pain.  Gastrointestinal: Negative for nausea and vomiting.  Musculoskeletal:       Negative for muscle weakness    PHYSICAL EXAM: Blood pressure (!) 183/75, pulse 80, temperature 98.8 F (37.1 C), temperature source Oral, height 5\' 3"  (1.6 m), weight 259 lb (117.5 kg), last menstrual period 02/02/2015, SpO2 97 %.  Body mass index is 45.88 kg/m. Physical Exam  Constitutional: She is oriented to person, place, and time. She appears well-developed and well-nourished.  Cardiovascular: Normal rate.  Pulmonary/Chest: Effort normal.  Musculoskeletal: Normal range of motion.  Neurological: She is oriented to person, place, and time.  Skin: Skin is warm and dry.  Psychiatric: She has a normal mood and affect. Her behavior is normal.  Vitals reviewed.   RECENT LABS AND TESTS: BMET    Component Value Date/Time   NA 139 11/14/2018 1107   K 4.4 11/14/2018 1107   CL 94 (L) 11/14/2018 1107   CO2 26 11/14/2018 1107   GLUCOSE 132 (H) 11/14/2018 1107   GLUCOSE 131 (H) 09/02/2018 0855   BUN 45 (H) 11/14/2018 1107   CREATININE 6.62 (H) 11/14/2018 1107   CREATININE 2.18 (H) 12/03/2016 1141   CALCIUM 9.7 11/14/2018 1107   GFRNONAA 6 (L) 11/14/2018 1107   GFRNONAA 25 (L) 12/03/2016 1141   GFRAA 7 (L) 11/14/2018 1107   GFRAA 29 (L) 12/03/2016 1141   Lab Results  Component Value Date   HGBA1C 7.4 (H) 12/30/2017   HGBA1C 10.2 (H) 10/24/2016   HGBA1C 8.4 06/26/2015   HGBA1C 12.4 (H) 03/25/2015   HGBA1C 12.7 (H) 03/23/2015   Lab Results  Component Value Date   INSULIN 9.9 11/14/2018   CBC    Component Value Date/Time   WBC  12.2 (H) 11/14/2018 1107   WBC 11.0 (H) 09/02/2018 0855   RBC 4.20 11/14/2018 1107   RBC 4.13 09/02/2018 0855   HGB 12.0 11/14/2018 1107   HCT 36.1 11/14/2018 1107   PLT 299 11/14/2018 1107   MCV 86 11/14/2018 1107   MCH 28.6 11/14/2018 1107   MCH 29.3 09/02/2018 0855   MCHC 33.2 11/14/2018 1107   MCHC 31.6 09/02/2018 0855   RDW 13.8 11/14/2018 1107   LYMPHSABS 3.5 (H) 11/14/2018 1107   MONOABS 1.0 12/30/2017 1400   EOSABS 0.3 11/14/2018 1107   BASOSABS 0.1 11/14/2018 1107   Iron/TIBC/Ferritin/ %Sat    Component Value Date/Time   IRON 33 12/31/2017 1913   TIBC 235 (L) 12/31/2017 1913   FERRITIN 167 12/31/2017 1913   IRONPCTSAT 14 12/31/2017 1913   Lipid Panel     Component Value Date/Time   CHOL 323 (H) 10/24/2016 1822   TRIG 236 (H) 10/24/2016 1822   HDL 70 10/24/2016 1822   CHOLHDL 4.6 10/24/2016 1822   VLDL 47 (H) 10/24/2016 1822   LDLCALC 206 (H) 10/24/2016 1822   Hepatic Function Panel     Component Value Date/Time   PROT 7.5 11/14/2018 1107   ALBUMIN 4.2 11/14/2018 1107   AST 17 11/14/2018 1107   ALT 13 11/14/2018 1107   ALKPHOS 116 11/14/2018 1107   BILITOT 0.3 11/14/2018 1107      Component Value Date/Time   TSH 1.920 11/14/2018 1107   TSH 1.977 12/30/2017 1703   TSH 2.330 10/24/2016 1822    Ref. Range 11/14/2018 11:07  Vitamin D, 25-Hydroxy Latest Ref Range: 30.0 - 100.0 ng/mL 19.5 (L)   ASSESSMENT AND PLAN: Essential hypertension  ESRD (end stage renal disease) (HCC)  Vitamin D deficiency  Type 2 diabetes mellitus without complication, without long-term current use of insulin (HCC)  At risk for osteoporosis  Class 3 severe obesity with serious comorbidity and body mass index (BMI) of 45.0 to 49.9 in adult, unspecified obesity type ([place])  PLAN:  Hypertension We discussed sodium restriction, working on healthy weight loss, and a regular exercise program  as the means to achieve improved blood pressure control. Rashika agreed with this plan  and agreed to follow up as directed. We will continue to monitor her blood pressure as well as her progress with the above lifestyle modifications. She agrees to start carvedilol 3.125 mg BID #60 with no refills and continue amlodipine as directed. She will take her blood pressure in the morning (and record). Shaeleigh will watch for signs of hypotension as she continues her lifestyle modifications.  ESRD on dialysis (end stage renal disease) Leroy will continue dialysis and she will limit her fluids to 32 ounces daily. Ibtisam agrees to follow up with our clinic in 2 weeks.  Vitamin D Deficiency Nadine was informed that low vitamin D levels contributes to fatigue and are associated with obesity, breast, and colon cancer. She will follow up for routine testing of vitamin D, at least 2-3 times per year. Registered dietician will call to check on if vitamin D supplements are in the dialysis regimen.  At risk for osteopenia and osteoporosis Laquita was given extended (15 minutes) osteoporosis prevention counseling today. Zahra is at risk for osteopenia and osteoporosis due to her vitamin D deficiency. She was encouraged to take her vitamin D and follow her higher calcium diet and increase strengthening exercise to help strengthen her bones and decrease her risk of osteopenia and osteoporosis.  Diabetes II Tilly has been given extensive diabetes education by myself today including ideal fasting and post-prandial blood glucose readings, individual ideal Hgb A1c goals and hypoglycemia prevention. We discussed the importance of good blood sugar control to decrease the likelihood of diabetic complications such as nephropathy, neuropathy, limb loss, blindness, coronary artery disease, and death. We discussed the importance of intensive lifestyle modification including diet, exercise and weight loss as the first line treatment for diabetes. Mayukha will start checking her blood sugars and will follow up at  the agreed upon time.  Obesity Phoua is currently in the action stage of change. As such, her goal is to continue with weight loss efforts She has agreed to follow the Category 3 plan Bellah has been instructed to work up to a goal of 150 minutes of combined cardio and strengthening exercise per week for weight loss and overall health benefits. We discussed the following Behavioral Modification Strategies today: no skipping meals, increasing lean protein intake, decreasing simple carbohydrates, increasing vegetables and work on meal planning and easy cooking plans Avantika will do more meal planning and she will limit water, but no additional fluids except water.  Elica has agreed to follow up with our clinic in 2 weeks. She was informed of the importance of frequent follow up visits to maximize her success with intensive lifestyle modifications for her multiple health conditions.   OBESITY BEHAVIORAL INTERVENTION VISIT  Today's visit was # 2   Starting weight: 258 lbs Starting date: 11/14/2018 Today's weight : 259 lbs Today's date: 11/29/2018 Total lbs lost to date: 0    ASK: We discussed the diagnosis of obesity with [name] today and Jacklyne agreed to give [place] permission to discuss obesity behavioral modification therapy today.  ASSESS: Ashlay has the diagnosis of obesity and her BMI today is 45.89 Courtne is in the action stage of change   ADVISE: Jakaria was educated on the multiple health risks of obesity as well as the benefit of weight loss to improve her health. She was advised of the need for long term treatment and the importance of lifestyle modifications to improve her current  health and to decrease her risk of future health problems.  AGREE: Multiple dietary modification options and treatment options were discussed and  Kadeidra agreed to follow the recommendations documented in the above note.  ARRANGE: Amaree was educated on the importance of frequent  visits to treat obesity as outlined per CMS and USPSTF guidelines and agreed to schedule her next follow up appointment today.  Corey Skains, am acting as Location manager for [employer]. Owens Shark, DO  I have reviewed the above documentation for accuracy and completeness, and I agree with the above. -Jearld Lesch, DO

## 2018-12-22 ENCOUNTER — Ambulatory Visit (INDEPENDENT_AMBULATORY_CARE_PROVIDER_SITE_OTHER): Payer: Managed Care, Other (non HMO) | Admitting: Bariatrics

## 2018-12-22 ENCOUNTER — Encounter (INDEPENDENT_AMBULATORY_CARE_PROVIDER_SITE_OTHER): Payer: Self-pay | Admitting: Bariatrics

## 2018-12-22 VITALS — BP 188/80 | HR 84 | Temp 97.7°F | Ht 63.0 in | Wt 260.0 lb

## 2018-12-22 DIAGNOSIS — Z6841 Body Mass Index (BMI) 40.0 and over, adult: Secondary | ICD-10-CM

## 2018-12-22 DIAGNOSIS — I10 Essential (primary) hypertension: Secondary | ICD-10-CM

## 2018-12-22 DIAGNOSIS — E119 Type 2 diabetes mellitus without complications: Secondary | ICD-10-CM

## 2018-12-22 DIAGNOSIS — E559 Vitamin D deficiency, unspecified: Secondary | ICD-10-CM | POA: Diagnosis not present

## 2018-12-22 DIAGNOSIS — Z9189 Other specified personal risk factors, not elsewhere classified: Secondary | ICD-10-CM

## 2018-12-22 DIAGNOSIS — N186 End stage renal disease: Secondary | ICD-10-CM

## 2018-12-22 MED ORDER — VITAMIN D (ERGOCALCIFEROL) 1.25 MG (50000 UNIT) PO CAPS: 50000.0000 [IU] | ORAL_CAPSULE | ORAL | 0 refills | Status: DC

## 2018-12-22 NOTE — Progress Notes (Signed)
Office: [phone]  /  Fax: [phone]   HPI:   Chief Complaint: OBESITY Pamela Campbell is here to discuss her progress with her obesity treatment plan. She is on the Category 3 plan and is following her eating plan approximately 75 % of the time. She states she is walking 30 minutes 5 times per week. Pamela Campbell has been struggling some with the diet. She states that she feels like she is gaining weight. She is feeling stronger. Pamela Campbell states that her blood sugars have been higher. Her weight is 260 lb (117.9 kg) today and has had a weight loss of 1 pound over a period of 3 weeks since her last visit. She has gained 2 lbs since starting treatment with [place].  Hypertension Pamela Campbell is a 58 y.o. female with hypertension. Pamela Campbell was not at goal at her last visit. Carvedilol 3 12.5 was added and she is also on Amlodipine. She states that dialysis said no to any new blood pressure medications.  Pamela Campbell denies any headaches. She is working weight loss to help control her blood pressure with the goal of decreasing her risk of heart attack and stroke. Pamela Campbell blood pressure is not currently controlled.  Diabetes II Pamela Campbell has a diagnosis of diabetes type II. She is not on medications (she is essentially at goal for someone with ESRD on dialysis). Pamela Campbell states fasting BGs range in the 130's and 2 hour post prandial BGs range in the 170's. She denies any hypoglycemic episodes. Last A1c was at 7.0 She has been working on intensive lifestyle modifications including diet, exercise, and weight loss to help control her blood glucose levels.  At risk for cardiovascular disease Pamela Campbell is at a higher than average risk for cardiovascular disease due to obesity, hypertension and diabetes. She currently denies any chest pain.  ESRD on dialysis (end stage renal disease) Pamela Campbell is seeing nephrology and is having dialysis three days per week consistently.  Vitamin D deficiency Pamela Campbell has a diagnosis  of vitamin D deficiency. She is not currently taking vit D and denies nausea, vomiting or muscle weakness.  ASSESSMENT AND PLAN:  Essential hypertension  Type 2 diabetes mellitus without complication, without long-term current use of insulin (HCC)  ESRD (end stage renal disease) (HCC)  Vitamin D deficiency - Plan: Vitamin D, Ergocalciferol, (DRISDOL) 1.25 MG (50000 UT) CAPS capsule  At risk for heart disease  Class 3 severe obesity with serious comorbidity and body mass index (BMI) of 45.0 to 49.9 in adult, unspecified obesity type ([place])  PLAN:  Hypertension We discussed sodium restriction, working on healthy weight loss, and a regular exercise program as the means to achieve improved blood pressure control. [name] agreed with this plan and agreed to follow up as directed. We will continue to monitor her blood pressure as well as her progress with the above lifestyle modifications. She will not start new medication at this time. Per patient, nephrology said they would control her blood pressure medications. Pamela Campbell will watch for signs of hypotension as she continues her lifestyle modifications.  Diabetes II Pamela Campbell has been given extensive diabetes education by myself today including ideal fasting and post-prandial blood glucose readings, individual ideal Hgb A1c goals and hypoglycemia prevention. We discussed the importance of good blood sugar control to decrease the likelihood of diabetic complications such as nephropathy, neuropathy, limb loss, blindness, coronary artery disease, and death. We discussed the importance of intensive lifestyle modification including diet, exercise and weight loss as the first line treatment for  diabetes. Pamela Campbell agrees to continue to follow her diet and no diabetes medications were prescribed today. [name] will follow up at the agreed upon time.  Cardiovascular risk counseling Pamela Campbell was given extended (15 minutes) coronary artery disease prevention  counseling today. She is 58 y.o. female and has risk factors for heart disease including obesity, hypertension and diabetes. We discussed intensive lifestyle modifications today with an emphasis on specific weight loss instructions and strategies. Pt was also informed of the importance of increasing exercise and decreasing saturated fats to help prevent heart disease.  ESRD on dialysis (end stage renal disease) Pamela Campbell will follow up with her nephrologist. She will decrease phosphorus promoting foods, primarily peanut butter. Pamela Campbell will follow up with our clinic in 2 weeks.  Vitamin D Deficiency Pamela Campbell was informed that low vitamin D levels contributes to fatigue and are associated with obesity, breast, and colon cancer. She agrees to continue to take prescription Vit D @50 ,000 IU every 2 weeks #2 with no refills and will follow up for routine testing of vitamin D, at least 2-3 times per year. She was informed of the risk of over-replacement of vitamin D and agrees to not increase her dose unless she discusses this with [place] first. Pamela Campbell agrees to follow up as directed.  Obesity Pamela Campbell is currently in the action stage of change. As such, her goal is to continue with weight loss efforts She has agreed to follow the Category 3 plan Pamela Campbell has been instructed to work up to a goal of 150 minutes of combined cardio and strengthening exercise per week for weight loss and overall health benefits. We discussed the following Behavioral Modification Strategies today: increase H2O intake up to 32 ounces daily, increasing lean protein intake, decreasing simple carbohydrates, increasing vegetables and decreasing sodium intake  Pamela Campbell has agreed to follow up with our clinic in 2 weeks. She was informed of the importance of frequent follow up visits to maximize her success with intensive lifestyle modifications for her multiple health conditions.  ALLERGIES: Allergies  Allergen Reactions  . Compazine  [Prochlorperazine Edisylate] Swelling    Tongue  Hardens and comes out of mouth  . Vioxx [Rofecoxib] Other (See Comments)    Bleeding    MEDICATIONS: Current Outpatient Medications on File Prior to Visit  Medication Sig Dispense Refill  . amLODipine (NORVASC) 10 MG tablet Take 1 tablet (10 mg total) by mouth daily. 30 tablet 1  . b complex-vitamin c-folic acid (NEPHRO-VITE) 0.8 MG TABS tablet Take 1 tablet by mouth daily.     . Coenzyme Q10 100 MG CPCR Take by mouth.    . ferric citrate (AURYXIA) 1 GM 210 MG(Fe) tablet Take 420 mg by mouth 3 (three) times daily with meals.    Pamela Campbell Kitchen ibuprofen (ADVIL,MOTRIN) 400 MG tablet Take 1 tablet (400 mg total) by mouth every 6 (six) hours as needed. 12 tablet 0  . ondansetron (ZOFRAN-ODT) 4 MG disintegrating tablet Take 4 mg by mouth every 6 (six) hours as needed for nausea.  0  . oxyCODONE (OXY IR/ROXICODONE) 5 MG immediate release tablet Take 1 tablet (5 mg total) by mouth every 6 (six) hours as needed for severe pain. 10 tablet 0   No current facility-administered medications on file prior to visit.     PAST MEDICAL HISTORY: Past Medical History:  Diagnosis Date  . Anemia   . Chronic diastolic CHF (congestive heart failure) ([place])    a. 03/2015: echo w/ EF of 50-55%, no WMA, Grade 2 DD, trivial AI,  mild MR.  . Chronic kidney disease (CKD), stage V ([place])    dialysis tuesday, thursday saterday  . Constipation   . Diabetes mellitus without complication (HCC)    CONTROLLED WITH DIET  . Dyspnea    non lately  . Edema, lower extremity   . Hypertension   . Overweight   . Pneumonia 2016   WHILE HOSPIATLIZED  . Shortness of breath     PAST SURGICAL HISTORY: Past Surgical History:  Procedure Laterality Date  . AV FISTULA PLACEMENT Right 01/04/2018   Procedure: ARTERIOVENOUS (AV) FISTULA CREATION RIGHT ARM;  Surgeon: Conrad Bransford, MD;  Location: [facility];  Service: Vascular;  Laterality: Right;  . AV FISTULA PLACEMENT Right 01/24/2018   Procedure:  ARTERIOVENOUS (AV) FISTULA CREATION RIGHT BRACHIOCEPHALIC;  Surgeon: Conrad Monroe, MD;  Location: [facility];  Service: Vascular;  Laterality: Right;  . CARDIAC CATHETERIZATION N/A 11/02/2016   Procedure: Left Heart Cath and Coronary Angiography;  Surgeon: Sherren Mocha, MD;  Location: [facility];  Service: Cardiovascular;  Laterality: N/A;  . CESAREAN SECTION    . COLONOSCOPY    . DILATATION & CURETTAGE/HYSTEROSCOPY WITH MYOSURE N/A 09/02/2018   Procedure: DILATATION & CURETTAGE/HYSTEROSCOPY WITH MYOSURE;  Surgeon: Christophe Louis, MD;  Location: [facility];  Service: Gynecology;  Laterality: N/A;  . FISTULA PLUG    . FISTULA SUPERFICIALIZATION Right 05/02/2018   Procedure: FISTULA SUPERFICIALIZATION RIGHT ARTERIOVENOUS FISTULA;  Surgeon: Conrad McAdenville, MD;  Location: [facility];  Service: Vascular;  Laterality: Right;  . FRACTURE SURGERY     shoulder left  . INSERTION OF DIALYSIS CATHETER Right 01/04/2018   Procedure: INSERTION OF DIALYSIS CATHETER RIGHT INTERNAL JUGULAR;  Surgeon: Conrad Funkley, MD;  Location: [facility];  Service: Vascular;  Laterality: Right;  . IR FLUORO GUIDE CV LINE RIGHT  12/31/2017  . IR US GUIDE VASC ACCESS RIGHT  12/31/2017  . left shoulder rotator    . LIGATION OF ARTERIOVENOUS  FISTULA Right 01/24/2018   Procedure: LIGATION OF  RIGHT RADIOCEPHALIC ARTERIOVENOUS  FISTULA;  Surgeon: Conrad , MD;  Location: [facility];  Service: Vascular;  Laterality: Right;  . TONSILECTOMY, ADENOIDECTOMY, BILATERAL MYRINGOTOMY AND TUBES    . TOOTH EXTRACTION    . TUBAL LIGATION      SOCIAL HISTORY: Social History   Tobacco Use  . Smoking status: Former Smoker    Last attempt to quit: 03/21/1998    Years since quitting: 20.7  . Smokeless tobacco: Never Used  Substance Use Topics  . Alcohol use: No  . Drug use: No    FAMILY HISTORY: Family History  Problem Relation Age of Onset  . Diabetes Mellitus II Mother   . Hypertension Mother   . Hyperlipidemia Mother   . Kidney disease Mother     . Eating disorder Mother   . Obesity Mother   . Heart disease Father   . Heart attack Father 24  . Congestive Heart Failure Brother   . Congestive Heart Failure Son     ROS: Review of Systems  Constitutional: Negative for weight loss.  Cardiovascular: Negative for chest pain.  Gastrointestinal: Negative for nausea and vomiting.  Musculoskeletal:       Negative for muscle weakness  Neurological: Negative for headaches.  Endo/Heme/Allergies:       Negative for hypoglycemia    PHYSICAL EXAM: Blood pressure (!) 188/80, pulse 84, temperature 97.7 F (36.5 C), temperature source Oral, height 5\' 3"  (1.6 m), weight 260 lb (117.9 kg), last  menstrual period 02/02/2015, SpO2 99 %. Body mass index is 46.06 kg/m. Physical Exam Vitals signs reviewed.  Constitutional:      Appearance: Normal appearance. She is well-developed. She is obese.  Cardiovascular:     Rate and Rhythm: Normal rate.  Pulmonary:     Effort: Pulmonary effort is normal.  Musculoskeletal: Normal range of motion.  Skin:    General: Skin is warm and dry.  Neurological:     Mental Status: She is alert and oriented to person, place, and time.  Psychiatric:        Mood and Affect: Mood normal.        Behavior: Behavior normal.     RECENT LABS AND TESTS: BMET    Component Value Date/Time   NA 139 11/14/2018 1107   K 4.4 11/14/2018 1107   CL 94 (L) 11/14/2018 1107   CO2 26 11/14/2018 1107   GLUCOSE 132 (H) 11/14/2018 1107   GLUCOSE 131 (H) 09/02/2018 0855   BUN 45 (H) 11/14/2018 1107   CREATININE 6.62 (H) 11/14/2018 1107   CREATININE 2.18 (H) 12/03/2016 1141   CALCIUM 9.7 11/14/2018 1107   GFRNONAA 6 (L) 11/14/2018 1107   GFRNONAA 25 (L) 12/03/2016 1141   GFRAA 7 (L) 11/14/2018 1107   GFRAA 29 (L) 12/03/2016 1141   Lab Results  Component Value Date   HGBA1C 7.4 (H) 12/30/2017   HGBA1C 10.2 (H) 10/24/2016   HGBA1C 8.4 06/26/2015   HGBA1C 12.4 (H) 03/25/2015   HGBA1C 12.7 (H) 03/23/2015   Lab  Results  Component Value Date   INSULIN 9.9 11/14/2018   CBC    Component Value Date/Time   WBC 12.2 (H) 11/14/2018 1107   WBC 11.0 (H) 09/02/2018 0855   RBC 4.20 11/14/2018 1107   RBC 4.13 09/02/2018 0855   HGB 12.0 11/14/2018 1107   HCT 36.1 11/14/2018 1107   PLT 299 11/14/2018 1107   MCV 86 11/14/2018 1107   MCH 28.6 11/14/2018 1107   MCH 29.3 09/02/2018 0855   MCHC 33.2 11/14/2018 1107   MCHC 31.6 09/02/2018 0855   RDW 13.8 11/14/2018 1107   LYMPHSABS 3.5 (H) 11/14/2018 1107   MONOABS 1.0 12/30/2017 1400   EOSABS 0.3 11/14/2018 1107   BASOSABS 0.1 11/14/2018 1107   Iron/TIBC/Ferritin/ %Sat    Component Value Date/Time   IRON 33 12/31/2017 1913   TIBC 235 (L) 12/31/2017 1913   FERRITIN 167 12/31/2017 1913   IRONPCTSAT 14 12/31/2017 1913   Lipid Panel     Component Value Date/Time   CHOL 323 (H) 10/24/2016 1822   TRIG 236 (H) 10/24/2016 1822   HDL 70 10/24/2016 1822   CHOLHDL 4.6 10/24/2016 1822   VLDL 47 (H) 10/24/2016 1822   LDLCALC 206 (H) 10/24/2016 1822   Hepatic Function Panel     Component Value Date/Time   PROT 7.5 11/14/2018 1107   ALBUMIN 4.2 11/14/2018 1107   AST 17 11/14/2018 1107   ALT 13 11/14/2018 1107   ALKPHOS 116 11/14/2018 1107   BILITOT 0.3 11/14/2018 1107      Component Value Date/Time   TSH 1.920 11/14/2018 1107   TSH 1.977 12/30/2017 1703   TSH 2.330 10/24/2016 1822   Results for MATTIA, OSTERMAN (MRN [id number]) as of 12/22/2018 15:38  Ref. Range 11/14/2018 11:07  Vitamin D, 25-Hydroxy Latest Ref Range: 30.0 - 100.0 ng/mL 19.5 (L)     OBESITY BEHAVIORAL INTERVENTION VISIT  Today's visit was # 3   Starting weight: 258 lbs Starting  date: 11/14/2018 Today's weight : 260 lbs Today's date: 12/22/2018 Total lbs lost to date: 0   ASK: We discussed the diagnosis of obesity with [name] today and Vanesa agreed to give [place] permission to discuss obesity behavioral modification therapy today.  ASSESS: Cayle has the  diagnosis of obesity and her BMI today is 46.07 Marcee is in the action stage of change   ADVISE: Lynnex was educated on the multiple health risks of obesity as well as the benefit of weight loss to improve her health. She was advised of the need for long term treatment and the importance of lifestyle modifications to improve her current health and to decrease her risk of future health problems.  AGREE: Multiple dietary modification options and treatment options were discussed and  Cynitha agreed to follow the recommendations documented in the above note.  ARRANGE: Keylah was educated on the importance of frequent visits to treat obesity as outlined per CMS and USPSTF guidelines and agreed to schedule her next follow up appointment today.  Corey Skains, am acting as Location manager for [employer]. Owens Shark, DO  I have reviewed the above documentation for accuracy and completeness, and I agree with the above. -Jearld Lesch, DO

## 2019-01-05 ENCOUNTER — Ambulatory Visit (INDEPENDENT_AMBULATORY_CARE_PROVIDER_SITE_OTHER): Payer: Managed Care, Other (non HMO) | Admitting: Bariatrics

## 2019-01-05 ENCOUNTER — Encounter (INDEPENDENT_AMBULATORY_CARE_PROVIDER_SITE_OTHER): Payer: Self-pay | Admitting: Bariatrics

## 2019-01-05 VITALS — BP 162/83 | HR 78 | Temp 98.6°F | Ht 63.0 in | Wt 255.0 lb

## 2019-01-05 DIAGNOSIS — E119 Type 2 diabetes mellitus without complications: Secondary | ICD-10-CM | POA: Diagnosis not present

## 2019-01-05 DIAGNOSIS — R682 Dry mouth, unspecified: Secondary | ICD-10-CM | POA: Diagnosis not present

## 2019-01-05 DIAGNOSIS — E559 Vitamin D deficiency, unspecified: Secondary | ICD-10-CM

## 2019-01-05 DIAGNOSIS — Z9189 Other specified personal risk factors, not elsewhere classified: Secondary | ICD-10-CM

## 2019-01-05 DIAGNOSIS — Z6841 Body Mass Index (BMI) 40.0 and over, adult: Secondary | ICD-10-CM

## 2019-01-05 DIAGNOSIS — I10 Essential (primary) hypertension: Secondary | ICD-10-CM | POA: Diagnosis not present

## 2019-01-05 MED ORDER — VITAMIN D (ERGOCALCIFEROL) 1.25 MG (50000 UNIT) PO CAPS: 50000.0000 [IU] | ORAL_CAPSULE | ORAL | 0 refills | Status: DC

## 2019-01-09 NOTE — Progress Notes (Signed)
Office: [phone]  /  Fax: [phone]   HPI:   Chief Complaint: OBESITY Pamela Campbell is here to discuss her progress with her obesity treatment plan. She is on the Category 3 plan and is following her eating plan approximately 100 % of the time. She states she is walking 30 minutes 5 times per week. Pamela Campbell is doing well. She does feel better. Pamela Campbell has decreased her diet to low phosphorus. She is taking a phosphorus binder with each meal. Pamela Campbell is taking in 32 ounces of water per her nephrologist.  Her weight is 255 lb (115.7 kg) today and has had a weight loss of 5 pounds over a period of 2 weeks since her last visit. She has lost 3 lbs since starting treatment with [place].  Hypertension Pamela Campbell is a 58 y.o. female with hypertension. She is taking Amlodipine. Her blood pressure is improved, but is still slightly elevated (162/83). Pamela Campbell denies chest pain or shortness of breath on exertion. She is working weight loss to help control her blood pressure with the goal of decreasing her risk of heart attack and stroke. Pamela Campbell blood pressure is not currently controlled.   Diabetes II Pamela Campbell has a diagnosis of diabetes type II. She is within the recommended A1c range for someone with ESRD on dialysis. Pamela Campbell denies any hypoglycemic episodes. Last A1c was at 7.0 She has been working on intensive lifestyle modifications including diet, exercise, and weight loss to help control her blood glucose levels.  At risk for cardiovascular disease Pamela Campbell is at a higher than average risk for cardiovascular disease due to obesity, diabetes and hypertension. She currently denies any chest pain.  Vitamin D deficiency Pamela Campbell has a diagnosis of vitamin D deficiency. Pamela Campbell is currently taking vit D every 2 weeks and she denies nausea, vomiting or muscle weakness.  Dry Mouth Pamela Campbell is on fluid restriction and she has dry mouth at night.  ASSESSMENT AND PLAN:  Essential  hypertension  Type 2 diabetes mellitus without complication, without long-term current use of insulin (HCC)  Vitamin D deficiency - Plan: Vitamin D, Ergocalciferol, (DRISDOL) 1.25 MG (50000 UT) CAPS capsule  Dry mouth  At risk for heart disease  Class 3 severe obesity with serious comorbidity and body mass index (BMI) of 45.0 to 49.9 in adult, unspecified obesity type (Pamela Campbell)  PLAN:  Hypertension We discussed sodium restriction, working on healthy weight loss, and a regular exercise program as the means to achieve improved blood pressure control. Pamela Campbell agreed with this plan and agreed to follow up as directed. We will continue to monitor her blood pressure as well as her progress with the above lifestyle modifications. She will continue her medications as prescribed and will watch for signs of hypotension as she continues her lifestyle modifications. I had suggested additional Blood pressure agent in the past, but patient she declined. She wants this managed by her nephrologist.  Diabetes II Pamela Campbell has been given extensive diabetes education by myself today including ideal fasting and post-prandial blood glucose readings, individual ideal Hgb A1c goals and hypoglycemia prevention. We discussed the importance of good blood sugar control to decrease the likelihood of diabetic complications such as nephropathy, neuropathy, limb loss, blindness, coronary artery disease, and death. We discussed the importance of intensive lifestyle modification including diet, exercise and weight loss as the first line treatment for diabetes. Pamela Campbell agrees to continue her diabetes medications and will follow up at the agreed upon time.  Cardiovascular risk counseling Pamela Campbell was given extended (  15 minutes) coronary artery disease prevention counseling today. She is 58 y.o. female and has risk factors for heart disease including obesity, diabetes and hypertension. We discussed intensive lifestyle modifications today  with an emphasis on specific weight loss instructions and strategies. Pt was also informed of the importance of increasing exercise and decreasing saturated fats to help prevent heart disease.  Vitamin D Deficiency Pamela Campbell was informed that low vitamin D levels contributes to fatigue and are associated with obesity, breast, and colon cancer. She agrees to continue to take prescription Vit D @50 ,000 IU every 2 weeks #2 with no refills and we will repeat her vitamin D level in the future (about 1 month). Pamela Campbell will follow up for routine testing of vitamin D, at least 2-3 times per year. She was informed of the risk of over-replacement of vitamin D and agrees to not increase her dose unless she discusses this with [place] first. Pamela Campbell agrees to follow up as directed.  Dry Mouth Pamela Campbell will use OTC Biotene (gum, toothpaste or mouthwash) to help her not exceed her water limit.  Obesity Pamela Campbell is currently in the action stage of change. As such, her goal is to continue with weight loss efforts She has agreed to follow the Category 3 plan and she will increase her meat and veggies Pamela Campbell has been instructed to work up to a goal of 150 minutes of combined cardio and strengthening exercise per week for weight loss and overall health benefits. We discussed the following Behavioral Modification Strategies today: increase H2O intake and she will continue to limit to 32 ounces, keeping healthy foods in the home, increasing lean protein intake, decreasing simple carbohydrates, increasing vegetables and work on meal planning and easy cooking plans  Pamela Campbell has agreed to follow up with our clinic in 2 weeks. She was informed of the importance of frequent follow up visits to maximize her success with intensive lifestyle modifications for her multiple health conditions.  ALLERGIES: Allergies  Allergen Reactions  . Compazine [Prochlorperazine Edisylate] Swelling    Tongue  Hardens and comes out of mouth  .  Vioxx [Rofecoxib] Other (See Comments)    Bleeding    MEDICATIONS: Current Outpatient Medications on File Prior to Visit  Medication Sig Dispense Refill  . amLODipine (NORVASC) 10 MG tablet Take 1 tablet (10 mg total) by mouth daily. 30 tablet 1  . b complex-vitamin c-folic acid (NEPHRO-VITE) 0.8 MG TABS tablet Take 1 tablet by mouth daily.     . Coenzyme Q10 100 MG CPCR Take by mouth.    . ferric citrate (AURYXIA) 1 GM 210 MG(Fe) tablet Take 420 mg by mouth 3 (three) times daily with meals.    Marland Kitchen ibuprofen (ADVIL,MOTRIN) 400 MG tablet Take 1 tablet (400 mg total) by mouth every 6 (six) hours as needed. 12 tablet 0  . ondansetron (ZOFRAN-ODT) 4 MG disintegrating tablet Take 4 mg by mouth every 6 (six) hours as needed for nausea.  0  . oxyCODONE (OXY IR/ROXICODONE) 5 MG immediate release tablet Take 1 tablet (5 mg total) by mouth every 6 (six) hours as needed for severe pain. 10 tablet 0   No current facility-administered medications on file prior to visit.     PAST MEDICAL HISTORY: Past Medical History:  Diagnosis Date  . Anemia   . Chronic diastolic CHF (congestive heart failure) ([place])    a. 03/2015: echo w/ EF of 50-55%, no WMA, Grade 2 DD, trivial AI, mild MR.  . Chronic kidney disease (CKD), stage V (  [place])    dialysis tuesday, thursday saterday  . Constipation   . Diabetes mellitus without complication (HCC)    CONTROLLED WITH DIET  . Dyspnea    non lately  . Edema, lower extremity   . Hypertension   . Overweight   . Pneumonia 2016   WHILE HOSPIATLIZED  . Shortness of breath     PAST SURGICAL HISTORY: Past Surgical History:  Procedure Laterality Date  . AV FISTULA PLACEMENT Right 01/04/2018   Procedure: ARTERIOVENOUS (AV) FISTULA CREATION RIGHT ARM;  Surgeon: Conrad Hickman, MD;  Location: [facility];  Service: Vascular;  Laterality: Right;  . AV FISTULA PLACEMENT Right 01/24/2018   Procedure: ARTERIOVENOUS (AV) FISTULA CREATION RIGHT BRACHIOCEPHALIC;  Surgeon: Conrad Brownlee,  MD;  Location: [facility];  Service: Vascular;  Laterality: Right;  . CARDIAC CATHETERIZATION N/A 11/02/2016   Procedure: Left Heart Cath and Coronary Angiography;  Surgeon: Sherren Mocha, MD;  Location: [facility];  Service: Cardiovascular;  Laterality: N/A;  . CESAREAN SECTION    . COLONOSCOPY    . DILATATION & CURETTAGE/HYSTEROSCOPY WITH MYOSURE N/A 09/02/2018   Procedure: DILATATION & CURETTAGE/HYSTEROSCOPY WITH MYOSURE;  Surgeon: Christophe Louis, MD;  Location: [facility];  Service: Gynecology;  Laterality: N/A;  . FISTULA PLUG    . FISTULA SUPERFICIALIZATION Right 05/02/2018   Procedure: FISTULA SUPERFICIALIZATION RIGHT ARTERIOVENOUS FISTULA;  Surgeon: Conrad [place], MD;  Location: [facility];  Service: Vascular;  Laterality: Right;  . FRACTURE SURGERY     shoulder left  . INSERTION OF DIALYSIS CATHETER Right 01/04/2018   Procedure: INSERTION OF DIALYSIS CATHETER RIGHT INTERNAL JUGULAR;  Surgeon: Conrad [place], MD;  Location: [facility];  Service: Vascular;  Laterality: Right;  . IR FLUORO GUIDE CV LINE RIGHT  12/31/2017  . IR US GUIDE VASC ACCESS RIGHT  12/31/2017  . left shoulder rotator    . LIGATION OF ARTERIOVENOUS  FISTULA Right 01/24/2018   Procedure: LIGATION OF  RIGHT RADIOCEPHALIC ARTERIOVENOUS  FISTULA;  Surgeon: Conrad [place], MD;  Location: [facility];  Service: Vascular;  Laterality: Right;  . TONSILECTOMY, ADENOIDECTOMY, BILATERAL MYRINGOTOMY AND TUBES    . TOOTH EXTRACTION    . TUBAL LIGATION      SOCIAL HISTORY: Social History   Tobacco Use  . Smoking status: Former Smoker    Last attempt to quit: 03/21/1998    Years since quitting: 20.8  . Smokeless tobacco: Never Used  Substance Use Topics  . Alcohol use: No  . Drug use: No    FAMILY HISTORY: Family History  Problem Relation Age of Onset  . Diabetes Mellitus II Mother   . Hypertension Mother   . Hyperlipidemia Mother   . Kidney disease Mother   . Eating disorder Mother   . Obesity Mother   . Heart disease Father   . Heart  attack Father 34  . Congestive Heart Failure Brother   . Congestive Heart Failure Son     ROS: Review of Systems  Constitutional: Positive for weight loss.  HENT:       + Dry Mouth  Respiratory: Negative for shortness of breath (on exertion).   Cardiovascular: Negative for chest pain.  Gastrointestinal: Positive for diarrhea. Negative for nausea and vomiting.  Musculoskeletal:       Negative for muscle weakness  Endo/Heme/Allergies:       Negative for hypoglycemia    PHYSICAL EXAM: Blood pressure (!) 162/83, pulse 78, temperature 98.6 F (37 C), temperature source Oral, height 5\' 3"  (  1.6 m), weight 255 lb (115.7 kg), last menstrual period 02/02/2015, SpO2 97 %. Body mass index is 45.17 kg/m. Physical Exam Vitals signs reviewed.  Constitutional:      Appearance: Normal appearance. She is well-developed. She is obese.  Cardiovascular:     Rate and Rhythm: Normal rate.  Pulmonary:     Effort: Pulmonary effort is normal.  Musculoskeletal: Normal range of motion.  Skin:    General: Skin is warm and dry.  Neurological:     Mental Status: She is alert and oriented to person, place, and time.  Psychiatric:        Mood and Affect: Mood normal.        Behavior: Behavior normal.     RECENT LABS AND TESTS: BMET    Component Value Date/Time   NA 139 11/14/2018 1107   K 4.4 11/14/2018 1107   CL 94 (L) 11/14/2018 1107   CO2 26 11/14/2018 1107   GLUCOSE 132 (H) 11/14/2018 1107   GLUCOSE 131 (H) 09/02/2018 0855   BUN 45 (H) 11/14/2018 1107   CREATININE 6.62 (H) 11/14/2018 1107   CREATININE 2.18 (H) 12/03/2016 1141   CALCIUM 9.7 11/14/2018 1107   GFRNONAA 6 (L) 11/14/2018 1107   GFRNONAA 25 (L) 12/03/2016 1141   GFRAA 7 (L) 11/14/2018 1107   GFRAA 29 (L) 12/03/2016 1141   Lab Results  Component Value Date   HGBA1C 7.4 (H) 12/30/2017   HGBA1C 10.2 (H) 10/24/2016   HGBA1C 8.4 06/26/2015   HGBA1C 12.4 (H) 03/25/2015   HGBA1C 12.7 (H) 03/23/2015   Lab Results   Component Value Date   INSULIN 9.9 11/14/2018   CBC    Component Value Date/Time   WBC 12.2 (H) 11/14/2018 1107   WBC 11.0 (H) 09/02/2018 0855   RBC 4.20 11/14/2018 1107   RBC 4.13 09/02/2018 0855   HGB 12.0 11/14/2018 1107   HCT 36.1 11/14/2018 1107   PLT 299 11/14/2018 1107   MCV 86 11/14/2018 1107   MCH 28.6 11/14/2018 1107   MCH 29.3 09/02/2018 0855   MCHC 33.2 11/14/2018 1107   MCHC 31.6 09/02/2018 0855   RDW 13.8 11/14/2018 1107   LYMPHSABS 3.5 (H) 11/14/2018 1107   MONOABS 1.0 12/30/2017 1400   EOSABS 0.3 11/14/2018 1107   BASOSABS 0.1 11/14/2018 1107   Iron/TIBC/Ferritin/ %Sat    Component Value Date/Time   IRON 33 12/31/2017 1913   TIBC 235 (L) 12/31/2017 1913   FERRITIN 167 12/31/2017 1913   IRONPCTSAT 14 12/31/2017 1913   Lipid Panel     Component Value Date/Time   CHOL 323 (H) 10/24/2016 1822   TRIG 236 (H) 10/24/2016 1822   HDL 70 10/24/2016 1822   CHOLHDL 4.6 10/24/2016 1822   VLDL 47 (H) 10/24/2016 1822   LDLCALC 206 (H) 10/24/2016 1822   Hepatic Function Panel     Component Value Date/Time   PROT 7.5 11/14/2018 1107   ALBUMIN 4.2 11/14/2018 1107   AST 17 11/14/2018 1107   ALT 13 11/14/2018 1107   ALKPHOS 116 11/14/2018 1107   BILITOT 0.3 11/14/2018 1107      Component Value Date/Time   TSH 1.920 11/14/2018 1107   TSH 1.977 12/30/2017 1703   TSH 2.330 10/24/2016 1822     Ref. Range 11/14/2018 11:07  Vitamin D, 25-Hydroxy Latest Ref Range: 30.0 - 100.0 ng/mL 19.5 (L)     OBESITY BEHAVIORAL INTERVENTION VISIT  Today's visit was # 4   Starting weight: 258 lbs Starting date: 11/14/2018  Today's weight : 255 lbs  Today's date: 01/05/2019 Total lbs lost to date: 3   ASK: We discussed the diagnosis of obesity with Pamela Campbell today and Pamela Campbell agreed to give [place] permission to discuss obesity behavioral modification therapy today.  ASSESS: Molley has the diagnosis of obesity and her BMI today is 45.18 Edwinna is in the action  stage of change   ADVISE: Janiyah was educated on the multiple health risks of obesity as well as the benefit of weight loss to improve her health. She was advised of the need for long term treatment and the importance of lifestyle modifications to improve her current health and to decrease her risk of future health problems.  AGREE: Multiple dietary modification options and treatment options were discussed and  Kyndel agreed to follow the recommendations documented in the above note.  ARRANGE: Avalene was educated on the importance of frequent visits to treat obesity as outlined per CMS and USPSTF guidelines and agreed to schedule her next follow up appointment today.  Corey Skains, am acting as Location manager for [employer]. Owens Shark, DO  I have reviewed the above documentation for accuracy and completeness, and I agree with the above. -Jearld Lesch, DO

## 2019-01-24 ENCOUNTER — Ambulatory Visit (INDEPENDENT_AMBULATORY_CARE_PROVIDER_SITE_OTHER): Payer: Managed Care, Other (non HMO) | Admitting: Bariatrics

## 2019-01-24 ENCOUNTER — Encounter (INDEPENDENT_AMBULATORY_CARE_PROVIDER_SITE_OTHER): Payer: Self-pay

## 2019-02-09 ENCOUNTER — Encounter (INDEPENDENT_AMBULATORY_CARE_PROVIDER_SITE_OTHER): Payer: Self-pay | Admitting: Family Medicine

## 2019-02-09 ENCOUNTER — Ambulatory Visit (INDEPENDENT_AMBULATORY_CARE_PROVIDER_SITE_OTHER): Payer: Managed Care, Other (non HMO) | Admitting: Family Medicine

## 2019-02-09 VITALS — BP 179/93 | HR 75 | Temp 97.9°F | Ht 63.0 in | Wt 252.0 lb

## 2019-02-09 DIAGNOSIS — E559 Vitamin D deficiency, unspecified: Secondary | ICD-10-CM | POA: Diagnosis not present

## 2019-02-09 DIAGNOSIS — Z9189 Other specified personal risk factors, not elsewhere classified: Secondary | ICD-10-CM | POA: Diagnosis not present

## 2019-02-09 DIAGNOSIS — Z6841 Body Mass Index (BMI) 40.0 and over, adult: Secondary | ICD-10-CM

## 2019-02-09 DIAGNOSIS — I10 Essential (primary) hypertension: Secondary | ICD-10-CM | POA: Diagnosis not present

## 2019-02-09 MED ORDER — VITAMIN D (ERGOCALCIFEROL) 1.25 MG (50000 UNIT) PO CAPS: 50000.0000 [IU] | ORAL_CAPSULE | ORAL | 0 refills | Status: DC

## 2019-02-13 ENCOUNTER — Encounter (INDEPENDENT_AMBULATORY_CARE_PROVIDER_SITE_OTHER): Payer: Self-pay | Admitting: Family Medicine

## 2019-02-13 DIAGNOSIS — E559 Vitamin D deficiency, unspecified: Secondary | ICD-10-CM | POA: Insufficient documentation

## 2019-02-13 NOTE — Progress Notes (Signed)
Office: [phone]  /  Fax: [phone]   HPI:   Chief Complaint: OBESITY Pamela Campbell is here to discuss her progress with her obesity treatment plan. She is on the Category 3 plan and is following her eating plan approximately 0 % of the time. She states she is walking 30 minutes 4-5 times per week. She is on hemodialysis. Pamela Campbell must lose 50 lbs to get on transplant list. She has cut out dairy due to high phosphorous. She is eating vegetables and meat. She reports that she has gained weight eating meat and vegetables in the past.  Her weight is 252 lb (114.3 kg) today and has had a weight loss of 3 pounds over a period of 5 weeks since her last visit. She has lost 6 lbs since starting treatment with [place].  Hypertension Pamela Campbell is a 58 y.o. female with hypertension. BP is elevated today. Pamela Campbell denies chest pain or shortness of breath. She is working weight loss to help control her blood pressure with the goal of decreasing her risk of heart attack and stroke. Pamela Campbell reports that her normal systolic blood pressure is  in the 200's. She reports that her nephrologist manages her blood pressure and 170's is good for her.   Vitamin D deficiency Pamela Campbell has a diagnosis of vitamin D deficiency. She is currently taking prescription Vit D and her nephrologist is aware she tales high dose vitamin D twice monthly.  She denies nausea, vomiting or muscle weakness. Vitamin D level is not at goal.  At risk for cardiovascular disease Pamela Campbell is at a higher than average risk for cardiovascular disease due to obesity. She currently denies any chest pain.  ASSESSMENT AND PLAN:  Essential hypertension  Vitamin D deficiency - Plan: Vitamin D, Ergocalciferol, (DRISDOL) 1.25 MG (50000 UT) CAPS capsule  At risk for heart disease  Class 3 severe obesity with serious comorbidity and body mass index (BMI) of 40.0 to 44.9 in adult, unspecified obesity type (Pamela Campbell)  PLAN:  Hypertension We  discussed sodium restriction, working on healthy weight loss, and a regular exercise program as the means to achieve improved blood pressure control. We will continue to monitor her blood pressure as well as her progress with the above lifestyle modifications. She will continue her medications and will watch for signs of hypotension as she continues her lifestyle modifications. Her blood pressure will be managed per Nephrology.  Pamela Campbell agrees with this plan and will follow up with our clinic in 2-3 weeks.   Vitamin D Deficiency Pamela Campbell was informed that low vitamin D levels contributes to fatigue and are associated with obesity, breast, and colon cancer. She agrees to continue to take prescription Vit D 50,000 IU every 14 days #2 with no refills and will follow up for routine testing of vitamin D, at least 2-3 times per year. She was informed of the risk of over-replacement of vitamin D and agrees to not increase her dose unless she discusses this with [place] first. Pamela Campbell agrees with this plan and will follow up with our clinic in 2-3 weeks.   Cardiovascular risk counseling Pamela Campbell was given extended (15 minutes) coronary artery disease prevention counseling today. She is 58 y.o. female and has risk factors for heart disease including obesity. We discussed intensive lifestyle modifications today with an emphasis on specific weight loss instructions and strategies. Pt was also informed of the importance of increasing exercise and decreasing saturated fats to help prevent heart disease.  Obesity Pamela Campbell is currently  in the action stage of change. As such, her goal is to continue with weight loss efforts She has agreed to change to follow a lower carbohydrate, vegetable and lean protein rich diet plan. As this is more congruent with the way she normally eats. The RD (Pamela Campbell, RD) modified the low carb plan to provide portion control for Pamela Campbell.  Per day: 19-21 gm lean meat. Limit cheese to 2-3 oz  per day 1 cup beans or 1/4 cup hummus 1/4 cup nuts  1 tbsp oil/butter or 1/2 avocado 3 cups non-starchy vegetables (may eat extra if she is hungry) 2 fruits per day   Pamela Campbell has been instructed to continue walking for 30 minutes 4-5 times a week We discussed the following Behavioral Modification Strategies today: increasing lean protein intake and planning for success  Pamela Campbell has agreed to follow up with our clinic in 2-3 weeks. She was informed of the importance of frequent follow up visits to maximize her success with intensive lifestyle modifications for her multiple health conditions.  ALLERGIES: Allergies  Allergen Reactions  . Compazine [Prochlorperazine Edisylate] Swelling    Tongue  Hardens and comes out of mouth  . Vioxx [Rofecoxib] Other (See Comments)    Bleeding    MEDICATIONS: Current Outpatient Medications on File Prior to Visit  Medication Sig Dispense Refill  . amLODipine (NORVASC) 10 MG tablet Take 1 tablet (10 mg total) by mouth daily. 30 tablet 1  . b complex-vitamin c-folic acid (NEPHRO-VITE) 0.8 MG TABS tablet Take 1 tablet by mouth daily.     . Coenzyme Q10 100 MG CPCR Take by mouth.    . ferric citrate (AURYXIA) 1 GM 210 MG(Fe) tablet Take 420 mg by mouth 3 (three) times daily with meals.    Marland Kitchen ibuprofen (ADVIL,MOTRIN) 400 MG tablet Take 1 tablet (400 mg total) by mouth every 6 (six) hours as needed. 12 tablet 0  . ondansetron (ZOFRAN-ODT) 4 MG disintegrating tablet Take 4 mg by mouth every 6 (six) hours as needed for nausea.  0  . oxyCODONE (OXY IR/ROXICODONE) 5 MG immediate release tablet Take 1 tablet (5 mg total) by mouth every 6 (six) hours as needed for severe pain. 10 tablet 0   No current facility-administered medications on file prior to visit.     PAST MEDICAL HISTORY: Past Medical History:  Diagnosis Date  . Anemia   . Chronic diastolic CHF (congestive heart failure) ([place])    a. 03/2015: echo w/ EF of 50-55%, no WMA, Grade 2 DD, trivial AI,  mild MR.  . Chronic kidney disease (CKD), stage V ([place])    dialysis tuesday, thursday saterday  . Constipation   . Diabetes mellitus without complication ([facility])    CONTROLLED WITH DIET  . Dyspnea    non lately  . Edema, lower extremity   . Hypertension   . Overweight   . Pneumonia 2016   WHILE HOSPIATLIZED  . Shortness of breath     PAST SURGICAL HISTORY: Past Surgical History:  Procedure Laterality Date  . AV FISTULA PLACEMENT Right 01/04/2018   Procedure: ARTERIOVENOUS (AV) FISTULA CREATION RIGHT ARM;  Surgeon: Conrad Bulger, MD;  Location: [facility];  Service: Vascular;  Laterality: Right;  . AV FISTULA PLACEMENT Right 01/24/2018   Procedure: ARTERIOVENOUS (AV) FISTULA CREATION RIGHT BRACHIOCEPHALIC;  Surgeon: Conrad Irondale, MD;  Location: [facility];  Service: Vascular;  Laterality: Right;  . CARDIAC CATHETERIZATION N/A 11/02/2016   Procedure: Left Heart Cath and Coronary Angiography;  Surgeon: Legrand Como  Burt Knack, MD;  Location: [facility];  Service: Cardiovascular;  Laterality: N/A;  . CESAREAN SECTION    . COLONOSCOPY    . DILATATION & CURETTAGE/HYSTEROSCOPY WITH MYOSURE N/A 09/02/2018   Procedure: DILATATION & CURETTAGE/HYSTEROSCOPY WITH MYOSURE;  Surgeon: Christophe Louis, MD;  Location: [facility];  Service: Gynecology;  Laterality: N/A;  . FISTULA PLUG    . FISTULA SUPERFICIALIZATION Right 05/02/2018   Procedure: FISTULA SUPERFICIALIZATION RIGHT ARTERIOVENOUS FISTULA;  Surgeon: Conrad Blawenburg, MD;  Location: [facility];  Service: Vascular;  Laterality: Right;  . FRACTURE SURGERY     shoulder left  . INSERTION OF DIALYSIS CATHETER Right 01/04/2018   Procedure: INSERTION OF DIALYSIS CATHETER RIGHT INTERNAL JUGULAR;  Surgeon: Conrad Manhattan, MD;  Location: [facility];  Service: Vascular;  Laterality: Right;  . IR FLUORO GUIDE CV LINE RIGHT  12/31/2017  . IR US GUIDE VASC ACCESS RIGHT  12/31/2017  . left shoulder rotator    . LIGATION OF ARTERIOVENOUS  FISTULA Right 01/24/2018   Procedure: LIGATION OF  RIGHT  RADIOCEPHALIC ARTERIOVENOUS  FISTULA;  Surgeon: Conrad Peavine, MD;  Location: [facility];  Service: Vascular;  Laterality: Right;  . TONSILECTOMY, ADENOIDECTOMY, BILATERAL MYRINGOTOMY AND TUBES    . TOOTH EXTRACTION    . TUBAL LIGATION      SOCIAL HISTORY: Social History   Tobacco Use  . Smoking status: Former Smoker    Last attempt to quit: 03/21/1998    Years since quitting: 20.9  . Smokeless tobacco: Never Used  Substance Use Topics  . Alcohol use: No  . Drug use: No    FAMILY HISTORY: Family History  Problem Relation Age of Onset  . Diabetes Mellitus II Mother   . Hypertension Mother   . Hyperlipidemia Mother   . Kidney disease Mother   . Eating disorder Mother   . Obesity Mother   . Heart disease Father   . Heart attack Father 51  . Congestive Heart Failure Brother   . Congestive Heart Failure Son     ROS: Review of Systems  Constitutional: Positive for weight loss.  Cardiovascular: Negative for chest pain.  Gastrointestinal: Negative for nausea and vomiting.  Musculoskeletal:       Negative for muscle weakness    PHYSICAL EXAM: Blood pressure (!) 179/93, pulse 75, temperature 97.9 F (36.6 C), height 5\' 3"  (1.6 m), weight 252 lb (114.3 kg), last menstrual period 02/02/2015, SpO2 100 %. Body mass index is 44.64 kg/m. Physical Exam Vitals signs reviewed.  Constitutional:      Appearance: Normal appearance. She is obese.  Cardiovascular:     Rate and Rhythm: Normal rate.     Pulses: Normal pulses.  Pulmonary:     Effort: Pulmonary effort is normal.  Musculoskeletal: Normal range of motion.  Skin:    General: Skin is warm and dry.  Neurological:     Mental Status: She is alert and oriented to person, place, and time.  Psychiatric:        Mood and Affect: Mood normal.        Behavior: Behavior normal.     RECENT LABS AND TESTS: BMET    Component Value Date/Time   NA 139 11/14/2018 1107   K 4.4 11/14/2018 1107   CL 94 (L) 11/14/2018 1107   CO2 26  11/14/2018 1107   GLUCOSE 132 (H) 11/14/2018 1107   GLUCOSE 131 (H) 09/02/2018 0855   BUN 45 (H) 11/14/2018 1107   CREATININE 6.62 (H) 11/14/2018 1107  CREATININE 2.18 (H) 12/03/2016 1141   CALCIUM 9.7 11/14/2018 1107   GFRNONAA 6 (L) 11/14/2018 1107   GFRNONAA 25 (L) 12/03/2016 1141   GFRAA 7 (L) 11/14/2018 1107   GFRAA 29 (L) 12/03/2016 1141   Lab Results  Component Value Date   HGBA1C 7.4 (H) 12/30/2017   HGBA1C 10.2 (H) 10/24/2016   HGBA1C 8.4 06/26/2015   HGBA1C 12.4 (H) 03/25/2015   HGBA1C 12.7 (H) 03/23/2015   Lab Results  Component Value Date   INSULIN 9.9 11/14/2018   CBC    Component Value Date/Time   WBC 12.2 (H) 11/14/2018 1107   WBC 11.0 (H) 09/02/2018 0855   RBC 4.20 11/14/2018 1107   RBC 4.13 09/02/2018 0855   HGB 12.0 11/14/2018 1107   HCT 36.1 11/14/2018 1107   PLT 299 11/14/2018 1107   MCV 86 11/14/2018 1107   MCH 28.6 11/14/2018 1107   MCH 29.3 09/02/2018 0855   MCHC 33.2 11/14/2018 1107   MCHC 31.6 09/02/2018 0855   RDW 13.8 11/14/2018 1107   LYMPHSABS 3.5 (H) 11/14/2018 1107   MONOABS 1.0 12/30/2017 1400   EOSABS 0.3 11/14/2018 1107   BASOSABS 0.1 11/14/2018 1107   Iron/TIBC/Ferritin/ %Sat    Component Value Date/Time   IRON 33 12/31/2017 1913   TIBC 235 (L) 12/31/2017 1913   FERRITIN 167 12/31/2017 1913   IRONPCTSAT 14 12/31/2017 1913   Lipid Panel     Component Value Date/Time   CHOL 323 (H) 10/24/2016 1822   TRIG 236 (H) 10/24/2016 1822   HDL 70 10/24/2016 1822   CHOLHDL 4.6 10/24/2016 1822   VLDL 47 (H) 10/24/2016 1822   LDLCALC 206 (H) 10/24/2016 1822   Hepatic Function Panel     Component Value Date/Time   PROT 7.5 11/14/2018 1107   ALBUMIN 4.2 11/14/2018 1107   AST 17 11/14/2018 1107   ALT 13 11/14/2018 1107   ALKPHOS 116 11/14/2018 1107   BILITOT 0.3 11/14/2018 1107      Component Value Date/Time   TSH 1.920 11/14/2018 1107   TSH 1.977 12/30/2017 1703   TSH 2.330 10/24/2016 1822    Ref. Range 11/14/2018 11:07   Vitamin D, 25-Hydroxy Latest Ref Range: 30.0 - 100.0 ng/mL 19.5 (L)     OBESITY BEHAVIORAL INTERVENTION VISIT  Today's visit was # 5   Starting weight: 258 lbs Starting date: 11/14/2018 Today's weight :: 252 lbs Today's date: 02/09/2019 Total lbs lost to date: 6     02/09/2019  BP 179/93 (A)  Temp 97.9 F (36.6 C)  Pulse 75  SpO2 100 %  Weight 252 lb  Height 5\' 3"  (1.6 m)  BMI (Calculated) 44.65    ASK: We discussed the diagnosis of obesity with Pamela Campbell today and Pamela Campbell agreed to give [place] permission to discuss obesity behavioral modification therapy today.  ASSESS: Pamela Campbell has the diagnosis of obesity and her BMI today is 44.65 Pamela Campbell is in the action stage of change   ADVISE: Pamela Campbell was educated on the multiple health risks of obesity as well as the benefit of weight loss to improve her health. She was advised of the need for long term treatment and the importance of lifestyle modifications to improve her current health and to decrease her risk of future health problems.  AGREE: Multiple dietary modification options and treatment options were discussed and  Pamela Campbell agreed to follow the recommendations documented in the above note.  ARRANGE: Pamela Campbell was educated on the importance of frequent visits to treat  obesity as outlined per CMS and USPSTF guidelines and agreed to schedule her next follow up appointment today.  I, Tammy Wysor, am acting as Location manager for [employer], FNP-C.  I have reviewed the above documentation for accuracy and completeness, and I agree with the above.  - Robley Matassa, FNP-C.

## 2019-02-16 ENCOUNTER — Other Ambulatory Visit (HOSPITAL_COMMUNITY): Payer: Self-pay | Admitting: Gastroenterology

## 2019-02-16 ENCOUNTER — Other Ambulatory Visit: Payer: Self-pay | Admitting: Gastroenterology

## 2019-02-16 DIAGNOSIS — R1010 Upper abdominal pain, unspecified: Secondary | ICD-10-CM

## 2019-02-16 DIAGNOSIS — R112 Nausea with vomiting, unspecified: Secondary | ICD-10-CM

## 2019-02-16 DIAGNOSIS — R140 Abdominal distension (gaseous): Secondary | ICD-10-CM

## 2019-02-23 ENCOUNTER — Encounter (HOSPITAL_COMMUNITY)
Admission: RE | Admit: 2019-02-23 | Discharge: 2019-02-23 | Disposition: A | Discharge status: Home / Self Care | Payer: Managed Care, Other (non HMO) | Source: Ambulatory Visit | Attending: Gastroenterology | Admitting: Gastroenterology

## 2019-02-23 DIAGNOSIS — R112 Nausea with vomiting, unspecified: Secondary | ICD-10-CM | POA: Insufficient documentation

## 2019-02-23 DIAGNOSIS — R140 Abdominal distension (gaseous): Secondary | ICD-10-CM | POA: Insufficient documentation

## 2019-02-23 DIAGNOSIS — R1010 Upper abdominal pain, unspecified: Secondary | ICD-10-CM | POA: Insufficient documentation

## 2019-02-23 MED ORDER — TECHNETIUM TC 99M SULFUR COLLOID
2.0000 | Freq: Once | INTRAVENOUS | Status: AC | PRN
  Administered 2019-02-23: 2 via ORAL

## 2019-02-28 ENCOUNTER — Encounter (INDEPENDENT_AMBULATORY_CARE_PROVIDER_SITE_OTHER): Payer: Self-pay

## 2019-02-28 ENCOUNTER — Ambulatory Visit (INDEPENDENT_AMBULATORY_CARE_PROVIDER_SITE_OTHER): Payer: Managed Care, Other (non HMO) | Admitting: Family Medicine

## 2019-03-15 ENCOUNTER — Encounter (INDEPENDENT_AMBULATORY_CARE_PROVIDER_SITE_OTHER): Payer: Self-pay

## 2019-03-28 ENCOUNTER — Encounter (INDEPENDENT_AMBULATORY_CARE_PROVIDER_SITE_OTHER): Payer: Self-pay

## 2019-03-28 ENCOUNTER — Other Ambulatory Visit (INDEPENDENT_AMBULATORY_CARE_PROVIDER_SITE_OTHER): Payer: Self-pay | Admitting: Family Medicine

## 2019-03-28 DIAGNOSIS — E559 Vitamin D deficiency, unspecified: Secondary | ICD-10-CM

## 2020-08-01 ENCOUNTER — Ambulatory Visit (INDEPENDENT_AMBULATORY_CARE_PROVIDER_SITE_OTHER): Payer: Managed Care, Other (non HMO)

## 2020-08-01 ENCOUNTER — Ambulatory Visit (HOSPITAL_COMMUNITY)
Admission: EM | Admit: 2020-08-01 | Discharge: 2020-08-01 | Disposition: A | Discharge status: Home / Self Care | Payer: Managed Care, Other (non HMO) | Attending: Family Medicine | Admitting: Family Medicine

## 2020-08-01 ENCOUNTER — Other Ambulatory Visit: Payer: Self-pay

## 2020-08-01 DIAGNOSIS — K5903 Drug induced constipation: Secondary | ICD-10-CM | POA: Diagnosis not present

## 2020-08-01 DIAGNOSIS — K5900 Constipation, unspecified: Secondary | ICD-10-CM

## 2020-08-01 NOTE — Discharge Instructions (Signed)
Push lactulose until you have a BM You will need a higher daily dose Consider a suppository or enema Call your doctor tomorrow

## 2020-08-01 NOTE — ED Triage Notes (Addendum)
Dialysis patient presenting with constipation. Has been taking over the counter medications (miralax and constulose) without relief. Reports vomiting 3 weeks ago-has not seen a provider. States she did not go to dialysis this am. States small bowel movements are very little. Has been taking calcium and forsenol supplements and states she feels like that is when it started.   States pain is across her upper abdomen.

## 2020-08-01 NOTE — ED Provider Notes (Signed)
[facility]    CSN: [id number] Arrival date & time: 08/01/20  1749      History   Chief Complaint Chief Complaint  Patient presents with   Constipation    HPI Pamela Campbell is a 59 y.o. female.   HPI   This is a dialysis patient.  Complicated medical history.  She has chronic constipation.  Recently she has had her medication changed and her constipation has been worse.  She is unable to have normal bowel movements in spite of taking lactulose 30 cc a day and MiraLAX daily.  She thinks adding the calcium has tipped the scales so that she can no longer move her bowels.  She only has very small amounts of bowel movement with a lot of straining.  Today she could need to go to dialysis because her stomach was so uncomfortable. She does have nausea.  She has vomiting.  She has early satiety. No blood in bowels. She states she has had a colonoscopy and it was negative.  Also has had an endoscopy.  States that her stomach was "raw". Is not currently on any medication for acid reflux or gastritis States that her abdominal girth is increasing.  She feels like she is bloated.  This is in spite of not changing her diet.  Past Medical History:  Diagnosis Date   Anemia    Chronic diastolic CHF (congestive heart failure) ([place])    a. 03/2015: echo w/ EF of 50-55%, no WMA, Grade 2 DD, trivial AI, mild MR.   Chronic kidney disease (CKD), stage V ([place])    dialysis tuesday, thursday saterday   Constipation    Diabetes mellitus without complication ([place])    CONTROLLED WITH DIET   Dyspnea    non lately   Edema, lower extremity    Hypertension    Overweight    Pneumonia 2016   WHILE HOSPIATLIZED   Shortness of breath     Patient Active Problem List   Diagnosis Date Noted   Vitamin D deficiency 02/13/2019   Postmenopausal bleeding 09/02/2018   Acute on chronic diastolic CHF (congestive heart failure) ([place]) 12/31/2017   Acute viral bronchitis    Acute on  chronic diastolic heart failure ([place]) 12/30/2017   Cough in adult 12/30/2017   Uncontrolled hypertension 12/30/2017   Diabetes mellitus type 2 in obese ([place]) 69/62/9528   Metabolic acidosis 41/32/4401   Hypertensive urgency 10/11/2017   Hepatitis C antibody test positive 12/08/2016   Minor CAD     Abnormal stress test    Essential hypertension    ESRD on dialysis [facility])    Unstable angina pectoris ([place])    Hypertensive emergency 10/24/2016   Chest pain with high risk for cardiac etiology 10/24/2016   HLD (hyperlipidemia) 07/17/2015   Type 2 diabetes mellitus ([place]) 03/27/2015   Chronic diastolic congestive heart failure ([place]) 03/25/2015   Accelerated hypertension 03/25/2015   Obesity BMI 45 03/25/2015   Sleep apnea 03/25/2015   Dyslipidemia 03/25/2015   Elevated troponin-demand ischemia 03/24/2015   Hypertensive heart disease 03/24/2015   Shortness of breath 03/24/2015    Past Surgical History:  Procedure Laterality Date   AV FISTULA PLACEMENT Right 01/04/2018   Procedure: ARTERIOVENOUS (AV) FISTULA CREATION RIGHT ARM;  Surgeon: Conrad Greeley, MD;  Location: [facility];  Service: Vascular;  Laterality: Right;   AV FISTULA PLACEMENT Right 01/24/2018   Procedure: ARTERIOVENOUS (AV) FISTULA CREATION RIGHT BRACHIOCEPHALIC;  Surgeon: Conrad [place], MD;  Location: [facility];  Service: Vascular;  Laterality: Right;   CARDIAC CATHETERIZATION N/A 11/02/2016   Procedure: Left Heart Cath and Coronary Angiography;  Surgeon: Sherren Mocha, MD;  Location: [facility];  Service: Cardiovascular;  Laterality: N/A;   CESAREAN SECTION     COLONOSCOPY     DILATATION & CURETTAGE/HYSTEROSCOPY WITH MYOSURE N/A 09/02/2018   Procedure: DILATATION & CURETTAGE/HYSTEROSCOPY WITH MYOSURE;  Surgeon: Christophe Louis, MD;  Location: [facility];  Service: Gynecology;  Laterality: N/A;   FISTULA PLUG     FISTULA SUPERFICIALIZATION Right 05/02/2018   Procedure: FISTULA SUPERFICIALIZATION RIGHT  ARTERIOVENOUS FISTULA;  Surgeon: Conrad [place], MD;  Location: [facility];  Service: Vascular;  Laterality: Right;   FRACTURE SURGERY     shoulder left   INSERTION OF DIALYSIS CATHETER Right 01/04/2018   Procedure: INSERTION OF DIALYSIS CATHETER RIGHT INTERNAL JUGULAR;  Surgeon: Conrad Berwick, MD;  Location: [facility];  Service: Vascular;  Laterality: Right;   IR FLUORO GUIDE CV LINE RIGHT  12/31/2017   IR US GUIDE VASC ACCESS RIGHT  12/31/2017   left shoulder rotator     LIGATION OF ARTERIOVENOUS  FISTULA Right 01/24/2018   Procedure: LIGATION OF  RIGHT RADIOCEPHALIC ARTERIOVENOUS  FISTULA;  Surgeon: Conrad [place], MD;  [facility];  Service: Vascular;  Laterality: Right;   TONSILECTOMY, ADENOIDECTOMY, BILATERAL MYRINGOTOMY AND TUBES     TOOTH EXTRACTION     TUBAL LIGATION      OB History    Gravida  3   Para  3   Term      Preterm      AB      Living        SAB      TAB      Ectopic      Multiple      Live Births               Home Medications    Prior to Admission medications   Medication Sig Start Date End Date Taking? Authorizing Provider  labetalol (NORMODYNE) 100 MG tablet Take 100 mg by mouth 2 (two) times daily.   Yes [provider]  lactulose (CHRONULAC) 10 GM/15ML solution Take by mouth 3 (three) times daily.   Yes [provider]  lanthanum (FOSRENOL) 1000 MG chewable tablet Chew 1,000 mg by mouth 2 (two) times daily with a meal.   Yes [provider]  calcium carbonate (OS-CAL) 1250 (500 Ca) MG chewable tablet Chew 1 tablet by mouth daily.  08/01/20 Yes [provider]  amLODipine (NORVASC) 10 MG tablet Take 1 tablet (10 mg total) by mouth daily. 01/06/18   Nita Sells, MD  Coenzyme Q10 100 MG CPCR Take by mouth.    [provider]  ondansetron (ZOFRAN-ODT) 4 MG disintegrating tablet Take 4 mg by mouth every 6 (six) hours as needed for nausea. 07/05/18   [provider]  Vitamin D,  Ergocalciferol, (DRISDOL) 1.25 MG (50000 UT) CAPS capsule Take 1 capsule (50,000 Units total) by mouth every 14 (fourteen) days. 02/09/19   [place], Joneen Boers, FNP    Family History Family History  Problem Relation Age of Onset   Diabetes Mellitus II Mother    Hypertension Mother    Hyperlipidemia Mother    Kidney disease Mother    Eating disorder Mother    Obesity Mother    Heart disease Father    Heart attack Father 81   Congestive Heart Failure Brother    Congestive Heart  Failure Son     Social History Social History   Tobacco Use   Smoking status: Former Smoker    Quit date: 03/21/1998    Years since quitting: 22.3   Smokeless tobacco: Never Used  Vaping Use   Vaping Use: Never used  Substance Use Topics   Alcohol use: No   Drug use: No     Allergies   Compazine [prochlorperazine edisylate] and Vioxx [rofecoxib]   Review of Systems Review of Systems See HPI  Physical Exam Triage Vital Signs ED Triage Vitals  Enc Vitals Group     BP 08/01/20 1912 (!) 203/87     Pulse Rate 08/01/20 1912 78     Resp 08/01/20 1912 (!) 25     Temp 08/01/20 1912 98.7 F (37.1 C)     Temp Source 08/01/20 1912 Oral     SpO2 08/01/20 1912 98 %     Weight --      Height --      Head Circumference --      Peak Flow --      Pain Score 08/01/20 1909 8     Pain Loc --      Pain Edu? --      Excl. in [place]? --    No data found.  Updated Vital Signs BP (!) 203/87 (BP Location: Left Arm)    Pulse 78    Temp 98.7 F (37.1 C) (Oral)    Resp (!) 25    LMP 02/02/2015    SpO2 98%     Physical Exam Constitutional:      General: She is not in acute distress.    Appearance: She is well-developed. She is obese.     Comments: Moves with difficulty.  Short of breath with any exertion  HENT:     Head: Normocephalic and atraumatic.     Mouth/Throat:     Comments: Mask is in place Eyes:     Conjunctiva/sclera: Conjunctivae normal.     Pupils: Pupils are equal, round, and  reactive to light.  Cardiovascular:     Rate and Rhythm: Normal rate and regular rhythm.     Heart sounds: Murmur heard.   Pulmonary:     Effort: Pulmonary effort is normal. No respiratory distress.     Breath sounds: Normal breath sounds.  Abdominal:     General: Bowel sounds are normal. There is no distension.     Palpations: Abdomen is soft.     Comments: Bowel sounds active.  Abdomen is protuberant distended, firm.  Tender diffusely  Musculoskeletal:        General: Normal range of motion.     Cervical back: Normal range of motion.  Skin:    General: Skin is warm and dry.  Neurological:     Mental Status: She is alert.      UC Treatments / Results  Labs (all labs ordered are listed, but only abnormal results are displayed) Labs Reviewed - No data to display  EKG   Radiology DG Abd 2 Views  Result Date: 08/01/2020 CLINICAL DATA:  59 year old female with constipation EXAM: ABDOMEN - 2 VIEW COMPARISON:  None. FINDINGS: Evaluation is limited due to body habitus. No bowel dilatation or evidence of obstruction. Oral contrast noted throughout the colon. There is colonic diverticula. No free air identified. The osseous structures are intact. IMPRESSION: No evidence of bowel obstruction. Electronically Signed   By: Anner Crete M.D.   On: 08/01/2020 20:07  Procedures Procedures (including critical care time)  Medications Ordered in UC Medications - No data to display  Initial Impression / Assessment and Plan / UC Course  I have reviewed the triage vital signs and the nursing notes.  Pertinent labs & imaging results that were available during my care of the patient were reviewed by me and considered in my medical decision making (see chart for details).     Patient is obstipated.  Her medicines and her fluctuating fluid balance, and activity are all contributing to her constipation.  I told her how to take lactulose.  That you cannot take too much.  Take a little, a  little more, a little more, and a little more until you are successful.  Discussed with nephrologist.  Also discussed enemas and glycerin suppositories at home. Final Clinical Impressions(s) / UC Diagnoses   Final diagnoses:  Drug-induced constipation     Discharge Instructions     Push lactulose until you have a BM You will need a higher daily dose Consider a suppository or enema Call your doctor tomorrow    ED Prescriptions    None     PDMP not reviewed this encounter.   Raylene Everts, MD 08/01/20 2134

## 2020-08-13 ENCOUNTER — Emergency Department (HOSPITAL_COMMUNITY): Payer: Managed Care, Other (non HMO)

## 2020-08-13 ENCOUNTER — Emergency Department (HOSPITAL_COMMUNITY)
Admission: EM | Admit: 2020-08-13 | Discharge: 2020-08-13 | Disposition: A | Discharge status: Home / Self Care | Payer: Managed Care, Other (non HMO) | Attending: Emergency Medicine | Admitting: Emergency Medicine

## 2020-08-13 ENCOUNTER — Encounter (HOSPITAL_COMMUNITY): Payer: Self-pay | Admitting: Emergency Medicine

## 2020-08-13 ENCOUNTER — Ambulatory Visit (HOSPITAL_COMMUNITY)
Admission: EM | Admit: 2020-08-13 | Discharge: 2020-08-13 | Disposition: A | Discharge status: Other Acute Inpatient Hospital | Payer: Managed Care, Other (non HMO) | Attending: Internal Medicine | Admitting: Internal Medicine

## 2020-08-13 ENCOUNTER — Other Ambulatory Visit: Payer: Self-pay

## 2020-08-13 DIAGNOSIS — R109 Unspecified abdominal pain: Secondary | ICD-10-CM | POA: Diagnosis not present

## 2020-08-13 DIAGNOSIS — R0602 Shortness of breath: Secondary | ICD-10-CM | POA: Diagnosis not present

## 2020-08-13 DIAGNOSIS — Z5321 Procedure and treatment not carried out due to patient leaving prior to being seen by health care provider: Secondary | ICD-10-CM | POA: Diagnosis not present

## 2020-08-13 LAB — COMPREHENSIVE METABOLIC PANEL
ALT: 21 U/L (ref 0–44)
AST: 20 U/L (ref 15–41)
Albumin: 3.6 g/dL (ref 3.5–5.0)
Alkaline Phosphatase: 146 U/L — ABNORMAL HIGH (ref 38–126)
Anion gap: 17 — ABNORMAL HIGH (ref 5–15)
BUN: 80 mg/dL — ABNORMAL HIGH (ref 6–20)
CO2: 23 mmol/L (ref 22–32)
Calcium: 8.9 mg/dL (ref 8.9–10.3)
Chloride: 102 mmol/L (ref 98–111)
Creatinine, Ser: 13 mg/dL — ABNORMAL HIGH (ref 0.44–1.00)
GFR calc Af Amer: 3 mL/min — ABNORMAL LOW (ref >60)
GFR calc non Af Amer: 3 mL/min — ABNORMAL LOW (ref >60)
Glucose, Bld: 164 mg/dL — ABNORMAL HIGH (ref 70–99)
Potassium: 5.2 mmol/L — ABNORMAL HIGH (ref 3.5–5.1)
Sodium: 142 mmol/L (ref 135–145)
Total Bilirubin: 0.4 mg/dL (ref 0.3–1.2)
Total Protein: 7.2 g/dL (ref 6.5–8.1)

## 2020-08-13 LAB — CBC
HCT: 34.9 % — ABNORMAL LOW (ref 36.0–46.0)
Hemoglobin: 10.8 g/dL — ABNORMAL LOW (ref 12.0–15.0)
MCH: 28.8 pg (ref 26.0–34.0)
MCHC: 30.9 g/dL (ref 30.0–36.0)
MCV: 93.1 fL (ref 80.0–100.0)
Platelets: 217 10*3/uL (ref 150–400)
RBC: 3.75 MIL/uL — ABNORMAL LOW (ref 3.87–5.11)
RDW: 13.8 % (ref 11.5–15.5)
WBC: 10.9 10*3/uL — ABNORMAL HIGH (ref 4.0–10.5)
nRBC: 0 % (ref 0.0–0.2)

## 2020-08-13 LAB — LIPASE, BLOOD: Lipase: 60 U/L — ABNORMAL HIGH (ref 11–51)

## 2020-08-13 NOTE — ED Triage Notes (Signed)
Pt c/o SOB onset this morning. She states she was supposed to go to dialysis today but she has been having problems with her bowels.

## 2020-08-13 NOTE — ED Notes (Signed)
lwbs

## 2020-08-13 NOTE — ED Notes (Addendum)
Pt was speaking in broken sentences. Pt had labored breathing, but was 98% on RA

## 2020-08-13 NOTE — ED Notes (Signed)
Patient is being discharged from the Urgent Care and sent to the Emergency Department via wheel chair w/UC staff . Per Tressia Miners, NP, patient is in need of higher level of care due to needing higher level of care.. Patient is aware and verbalizes understanding of plan of care.  Vitals:   08/13/20 1240  BP: (!) 197/97  Pulse: 89  Resp: (!) 21  SpO2: 98%

## 2020-08-13 NOTE — ED Triage Notes (Addendum)
Pt arrives to ED with complaints of abdominal cramping/pain since Sunday night. Pt states the abdominal cramping is constant. Pt missed her dialysis today due to her abdominal cramping. States SOB started yesterday and has gotten worse today. Pt states she had 10lb weight gain in fluid this week. Hx constipation, was constipated this past week.

## 2020-12-09 ENCOUNTER — Emergency Department (HOSPITAL_COMMUNITY): Payer: Medicare Other

## 2020-12-09 ENCOUNTER — Inpatient Hospital Stay (HOSPITAL_COMMUNITY)
Admission: EM | Admit: 2020-12-09 | Discharge: 2020-12-11 | DRG: 291 | Disposition: A | Discharge status: Home / Self Care | Payer: Medicare Other | Attending: Internal Medicine | Admitting: Internal Medicine

## 2020-12-09 DIAGNOSIS — I2510 Atherosclerotic heart disease of native coronary artery without angina pectoris: Secondary | ICD-10-CM | POA: Diagnosis present

## 2020-12-09 DIAGNOSIS — Z8249 Family history of ischemic heart disease and other diseases of the circulatory system: Secondary | ICD-10-CM | POA: Diagnosis not present

## 2020-12-09 DIAGNOSIS — E785 Hyperlipidemia, unspecified: Secondary | ICD-10-CM | POA: Diagnosis present

## 2020-12-09 DIAGNOSIS — Z992 Dependence on renal dialysis: Secondary | ICD-10-CM | POA: Diagnosis not present

## 2020-12-09 DIAGNOSIS — Z833 Family history of diabetes mellitus: Secondary | ICD-10-CM | POA: Diagnosis not present

## 2020-12-09 DIAGNOSIS — E119 Type 2 diabetes mellitus without complications: Secondary | ICD-10-CM

## 2020-12-09 DIAGNOSIS — Z20822 Contact with and (suspected) exposure to covid-19: Secondary | ICD-10-CM | POA: Diagnosis present

## 2020-12-09 DIAGNOSIS — J810 Acute pulmonary edema: Secondary | ICD-10-CM | POA: Diagnosis not present

## 2020-12-09 DIAGNOSIS — I132 Hypertensive heart and chronic kidney disease with heart failure and with stage 5 chronic kidney disease, or end stage renal disease: Principal | ICD-10-CM | POA: Diagnosis present

## 2020-12-09 DIAGNOSIS — R0902 Hypoxemia: Secondary | ICD-10-CM | POA: Diagnosis not present

## 2020-12-09 DIAGNOSIS — E669 Obesity, unspecified: Secondary | ICD-10-CM | POA: Diagnosis present

## 2020-12-09 DIAGNOSIS — R6511 Systemic inflammatory response syndrome (SIRS) of non-infectious origin with acute organ dysfunction: Secondary | ICD-10-CM | POA: Diagnosis present

## 2020-12-09 DIAGNOSIS — I10 Essential (primary) hypertension: Secondary | ICD-10-CM | POA: Diagnosis present

## 2020-12-09 DIAGNOSIS — R6510 Systemic inflammatory response syndrome (SIRS) of non-infectious origin without acute organ dysfunction: Secondary | ICD-10-CM | POA: Diagnosis present

## 2020-12-09 DIAGNOSIS — E1169 Type 2 diabetes mellitus with other specified complication: Secondary | ICD-10-CM | POA: Diagnosis present

## 2020-12-09 DIAGNOSIS — Z87891 Personal history of nicotine dependence: Secondary | ICD-10-CM | POA: Diagnosis not present

## 2020-12-09 DIAGNOSIS — E7849 Other hyperlipidemia: Secondary | ICD-10-CM | POA: Diagnosis not present

## 2020-12-09 DIAGNOSIS — I5031 Acute diastolic (congestive) heart failure: Secondary | ICD-10-CM | POA: Diagnosis not present

## 2020-12-09 DIAGNOSIS — J9601 Acute respiratory failure with hypoxia: Secondary | ICD-10-CM | POA: Diagnosis present

## 2020-12-09 DIAGNOSIS — N186 End stage renal disease: Secondary | ICD-10-CM | POA: Diagnosis present

## 2020-12-09 DIAGNOSIS — I161 Hypertensive emergency: Secondary | ICD-10-CM | POA: Diagnosis present

## 2020-12-09 DIAGNOSIS — Z841 Family history of disorders of kidney and ureter: Secondary | ICD-10-CM

## 2020-12-09 DIAGNOSIS — Z6841 Body Mass Index (BMI) 40.0 and over, adult: Secondary | ICD-10-CM | POA: Diagnosis not present

## 2020-12-09 DIAGNOSIS — E1122 Type 2 diabetes mellitus with diabetic chronic kidney disease: Secondary | ICD-10-CM | POA: Diagnosis present

## 2020-12-09 DIAGNOSIS — I5033 Acute on chronic diastolic (congestive) heart failure: Secondary | ICD-10-CM | POA: Diagnosis present

## 2020-12-09 DIAGNOSIS — D72829 Elevated white blood cell count, unspecified: Secondary | ICD-10-CM

## 2020-12-09 DIAGNOSIS — E875 Hyperkalemia: Secondary | ICD-10-CM | POA: Diagnosis present

## 2020-12-09 DIAGNOSIS — E1100 Type 2 diabetes mellitus with hyperosmolarity without nonketotic hyperglycemic-hyperosmolar coma (NKHHC): Secondary | ICD-10-CM

## 2020-12-09 DIAGNOSIS — Z83438 Family history of other disorder of lipoprotein metabolism and other lipidemia: Secondary | ICD-10-CM | POA: Diagnosis not present

## 2020-12-09 DIAGNOSIS — E7800 Pure hypercholesterolemia, unspecified: Secondary | ICD-10-CM | POA: Diagnosis present

## 2020-12-09 DIAGNOSIS — E6609 Other obesity due to excess calories: Secondary | ICD-10-CM | POA: Diagnosis not present

## 2020-12-09 DIAGNOSIS — R778 Other specified abnormalities of plasma proteins: Secondary | ICD-10-CM | POA: Diagnosis not present

## 2020-12-09 DIAGNOSIS — J811 Chronic pulmonary edema: Secondary | ICD-10-CM

## 2020-12-09 LAB — COMPREHENSIVE METABOLIC PANEL
ALT: 38 U/L (ref 0–44)
AST: 37 U/L (ref 15–41)
Albumin: 3.5 g/dL (ref 3.5–5.0)
Alkaline Phosphatase: 128 U/L — ABNORMAL HIGH (ref 38–126)
Anion gap: 15 (ref 5–15)
BUN: 68 mg/dL — ABNORMAL HIGH (ref 6–20)
CO2: 23 mmol/L (ref 22–32)
Calcium: 9.5 mg/dL (ref 8.9–10.3)
Chloride: 100 mmol/L (ref 98–111)
Creatinine, Ser: 12.46 mg/dL — ABNORMAL HIGH (ref 0.44–1.00)
GFR, Estimated: 3 mL/min — ABNORMAL LOW (ref >60)
Glucose, Bld: 146 mg/dL — ABNORMAL HIGH (ref 70–99)
Potassium: 6.3 mmol/L (ref 3.5–5.1)
Sodium: 138 mmol/L (ref 135–145)
Total Bilirubin: 0.5 mg/dL (ref 0.3–1.2)
Total Protein: 7.3 g/dL (ref 6.5–8.1)

## 2020-12-09 LAB — I-STAT VENOUS BLOOD GAS, ED
Acid-Base Excess: 0 mmol/L (ref 0.0–2.0)
Bicarbonate: 25.5 mmol/L (ref 20.0–28.0)
Calcium, Ion: 1.14 mmol/L — ABNORMAL LOW (ref 1.15–1.40)
HCT: 41 % (ref 36.0–46.0)
Hemoglobin: 13.9 g/dL (ref 12.0–15.0)
O2 Saturation: 96 %
Potassium: 6.8 mmol/L (ref 3.5–5.1)
Sodium: 138 mmol/L (ref 135–145)
TCO2: 27 mmol/L (ref 22–32)
pCO2, Ven: 44 mmHg (ref 44.0–60.0)
pH, Ven: 7.371 (ref 7.250–7.430)
pO2, Ven: 83 mmHg — ABNORMAL HIGH (ref 32.0–45.0)

## 2020-12-09 LAB — CBC WITH DIFFERENTIAL/PLATELET
Abs Immature Granulocytes: 0.1 10*3/uL — ABNORMAL HIGH (ref 0.00–0.07)
Basophils Absolute: 0.1 10*3/uL (ref 0.0–0.1)
Basophils Relative: 0 %
Eosinophils Absolute: 0.1 10*3/uL (ref 0.0–0.5)
Eosinophils Relative: 1 %
HCT: 36.2 % (ref 36.0–46.0)
Hemoglobin: 11.2 g/dL — ABNORMAL LOW (ref 12.0–15.0)
Immature Granulocytes: 1 %
Lymphocytes Relative: 11 %
Lymphs Abs: 2.2 10*3/uL (ref 0.7–4.0)
MCH: 29.2 pg (ref 26.0–34.0)
MCHC: 30.9 g/dL (ref 30.0–36.0)
MCV: 94.5 fL (ref 80.0–100.0)
Monocytes Absolute: 1.2 10*3/uL — ABNORMAL HIGH (ref 0.1–1.0)
Monocytes Relative: 6 %
Neutro Abs: 16.2 10*3/uL — ABNORMAL HIGH (ref 1.7–7.7)
Neutrophils Relative %: 81 %
Platelets: 276 10*3/uL (ref 150–400)
RBC: 3.83 MIL/uL — ABNORMAL LOW (ref 3.87–5.11)
RDW: 14.6 % (ref 11.5–15.5)
WBC: 19.8 10*3/uL — ABNORMAL HIGH (ref 4.0–10.5)
nRBC: 0 % (ref 0.0–0.2)

## 2020-12-09 LAB — LACTIC ACID, PLASMA: Lactic Acid, Venous: 1.4 mmol/L (ref 0.5–1.9)

## 2020-12-09 LAB — PROTIME-INR
INR: 1.1 (ref 0.8–1.2)
Prothrombin Time: 13.7 seconds (ref 11.4–15.2)

## 2020-12-09 LAB — CBG MONITORING, ED: Glucose-Capillary: 134 mg/dL — ABNORMAL HIGH (ref 70–99)

## 2020-12-09 LAB — BRAIN NATRIURETIC PEPTIDE: B Natriuretic Peptide: 855 pg/mL — ABNORMAL HIGH (ref 0.0–100.0)

## 2020-12-09 LAB — TROPONIN I (HIGH SENSITIVITY): Troponin I (High Sensitivity): 57 ng/L — ABNORMAL HIGH (ref <18)

## 2020-12-09 MED ORDER — ONDANSETRON HCL 4 MG/2ML IJ SOLN
4.0000 mg | Freq: Once | INTRAMUSCULAR | Status: AC
  Administered 2020-12-09: 4 mg via INTRAVENOUS
  Filled 2020-12-09: qty 2

## 2020-12-09 MED ORDER — DEXTROSE 50 % IV SOLN
1.0000 | Freq: Once | INTRAVENOUS | Status: AC
  Administered 2020-12-09: 50 mL via INTRAVENOUS
  Filled 2020-12-09: qty 50

## 2020-12-09 MED ORDER — INSULIN ASPART 100 UNIT/ML IV SOLN
5.0000 [IU] | Freq: Once | INTRAVENOUS | Status: AC
  Administered 2020-12-09: 5 [IU] via INTRAVENOUS

## 2020-12-09 MED ORDER — FUROSEMIDE 10 MG/ML IJ SOLN
40.0000 mg | Freq: Once | INTRAMUSCULAR | Status: AC
  Administered 2020-12-09: 23:00:00 40 mg via INTRAVENOUS
  Filled 2020-12-09: qty 4

## 2020-12-09 MED ORDER — METHYLPREDNISOLONE SODIUM SUCC 125 MG IJ SOLR
125.0000 mg | Freq: Once | INTRAMUSCULAR | Status: AC
  Administered 2020-12-09: 23:00:00 125 mg via INTRAVENOUS
  Filled 2020-12-09: qty 2

## 2020-12-09 MED ORDER — SODIUM BICARBONATE 8.4 % IV SOLN
50.0000 meq | Freq: Once | INTRAVENOUS | Status: AC
  Administered 2020-12-09: 23:00:00 50 meq via INTRAVENOUS
  Filled 2020-12-09: qty 50

## 2020-12-09 MED ORDER — NITROGLYCERIN 2 % TD OINT
1.0000 [in_us] | TOPICAL_OINTMENT | Freq: Once | TRANSDERMAL | Status: AC
  Administered 2020-12-09: 23:00:00 1 [in_us] via TOPICAL
  Filled 2020-12-09: qty 1

## 2020-12-09 NOTE — Consult Note (Signed)
[facility]  INPATIENT CONSULTATION  Reason for Consultation: hypoxic respiratory failure secondary to volume overload in ESRD Requesting Provider: Dr. Dina Rich  HPI: Pamela Campbell is an 59 y.o. female with ESRD on HD TTS at [facility], morbid obesity, HFpEF, DM, HTN who is seen for evaluation and management of hypoxia in setting of ESRD.  Formerly [facility] patient now going to [facility] HD x ~1y who presented to ED this evening with dyspnea, orthopnea.  Initial T 100.7, HTN to 240, hypoxic with initial O2 sats in the 70s improved on Bipap.  CXR with pulmonary edema, labs with K 6.3 (being medically tx with insulin, dextrose), WBC 19.8k.  Viral resp panel pending currently.  Given 125mg  medrol and NTG topical.  She says she is compliant with dialysis treatments but did sign off with 1 hr left of treatment Sat due to cramping.  EDW 121.5kg and was supposed to be raised but she isn't sure to what weight.  She left 3kg over EDW Sat.  Denies f/c/cough.  Says BPs chronically elevated and if < 140 she is symptomatic for low BP. Chronic nausea not recently different.    PMH: Past Medical History:  Diagnosis Date  . Anemia   . Chronic diastolic CHF (congestive heart failure) ([place])    a. 03/2015: echo w/ EF of 50-55%, no WMA, Grade 2 DD, trivial AI, mild MR.  . Chronic kidney disease (CKD), stage V ([place])    dialysis tuesday, thursday saterday  . Constipation   . Diabetes mellitus without complication (HCC)    CONTROLLED WITH DIET  . Dyspnea    non lately  . Edema, lower extremity   . Hypertension   . Overweight   . Pneumonia 2016   WHILE HOSPIATLIZED  . Shortness of breath    PSH: Past Surgical History:  Procedure Laterality Date  . AV FISTULA PLACEMENT Right 01/04/2018   Procedure: ARTERIOVENOUS (AV) FISTULA CREATION RIGHT ARM;  Surgeon: Conrad Gillespie, MD;  Location: [place];  Service: Vascular;  Laterality: Right;  . AV FISTULA PLACEMENT Right 01/24/2018   Procedure: ARTERIOVENOUS (AV)  FISTULA CREATION RIGHT BRACHIOCEPHALIC;  Surgeon: Conrad [place], MD;  Location: [place];  Service: Vascular;  Laterality: Right;  . CARDIAC CATHETERIZATION N/A 11/02/2016   Procedure: Left Heart Cath and Coronary Angiography;  Surgeon: Sherren Mocha, MD;  Location: [place] [facility];  Service: Cardiovascular;  Laterality: N/A;  . CESAREAN SECTION    . COLONOSCOPY    . DILATATION & CURETTAGE/HYSTEROSCOPY WITH MYOSURE N/A 09/02/2018   Procedure: DILATATION & CURETTAGE/HYSTEROSCOPY WITH MYOSURE;  Surgeon: Christophe Louis, MD;  Location: [facility];  Service: Gynecology;  Laterality: N/A;  . FISTULA PLUG    . FISTULA SUPERFICIALIZATION Right 05/02/2018   Procedure: FISTULA SUPERFICIALIZATION RIGHT ARTERIOVENOUS FISTULA;  Surgeon: Conrad [place], MD;  Location: [place];  Service: Vascular;  Laterality: Right;  . FRACTURE SURGERY     shoulder left  . INSERTION OF DIALYSIS CATHETER Right 01/04/2018   Procedure: INSERTION OF DIALYSIS CATHETER RIGHT INTERNAL JUGULAR;  Surgeon: Conrad [place], MD;  Location: [place];  Service: Vascular;  Laterality: Right;  . IR FLUORO GUIDE CV LINE RIGHT  12/31/2017  . IR US GUIDE VASC ACCESS RIGHT  12/31/2017  . left shoulder rotator    . LIGATION OF ARTERIOVENOUS  FISTULA Right 01/24/2018   Procedure: LIGATION OF  RIGHT RADIOCEPHALIC ARTERIOVENOUS  FISTULA;  Surgeon: Conrad [place], MD;  Location: [place];  Service: Vascular;  Laterality: Right;  .  TONSILECTOMY, ADENOIDECTOMY, BILATERAL MYRINGOTOMY AND TUBES    . TOOTH EXTRACTION    . TUBAL LIGATION       Past Medical History:  Diagnosis Date  . Anemia   . Chronic diastolic CHF (congestive heart failure) ([place])    a. 03/2015: echo w/ EF of 50-55%, no WMA, Grade 2 DD, trivial AI, mild MR.  . Chronic kidney disease (CKD), stage V ([place])    dialysis tuesday, thursday saterday  . Constipation   . Diabetes mellitus without complication (HCC)    CONTROLLED WITH DIET  . Dyspnea    non lately  . Edema, lower extremity   . Hypertension    . Overweight   . Pneumonia 2016   WHILE HOSPIATLIZED  . Shortness of breath     Medications:  I have reviewed the patient's current medications.  (Not in a hospital admission)   ALLERGIES:   Allergies  Allergen Reactions  . Compazine [Prochlorperazine Edisylate] Swelling    Tongue  Hardens and comes out of mouth (Tardive dyskinesia)  . Vioxx [Rofecoxib] Other (See Comments)    Bleeding    FAM HX: Family History  Problem Relation Age of Onset  . Diabetes Mellitus II Mother   . Hypertension Mother   . Hyperlipidemia Mother   . Kidney disease Mother   . Eating disorder Mother   . Obesity Mother   . Heart disease Father   . Heart attack Father 59  . Congestive Heart Failure Brother   . Congestive Heart Failure Son     Social History:   reports that she quit smoking about 22 years ago. She has never used smokeless tobacco. She reports that she does not drink alcohol and does not use drugs.  ROS: 12 system ROS except per HPI above  Blood pressure (!) 229/89, pulse 86, temperature (!) 100.7 F (38.2 C), resp. rate (!) 26, height 5\' 4"  (1.626 m), weight 126 kg, last menstrual period 02/02/2015, SpO2 100 %. PHYSICAL EXAM: Gen: morbidly obese woman on bipap, not in distress but uncomfortable  Eyes: anicteric ENT: dry on bipap Neck: thick CV:  RRR Abd: soft, obese Lungs: dec BS bases, sl ^ wob on bipap GU: no foley Extr: trace LE edema, RUE AVG +t/b Neuro: conversant, grossly nonfocal Skin: cool and dry   Results for orders placed or performed during the hospital encounter of 12/09/20 (from the past 48 hour(s))  CBC with Differential     Status: Abnormal   Collection Time: 12/09/20  9:29 PM  Result Value Ref Range   WBC 19.8 (H) 4.0 - 10.5 K/uL   RBC 3.83 (L) 3.87 - 5.11 MIL/uL   Hemoglobin 11.2 (L) 12.0 - 15.0 g/dL   HCT 36.2 36.0 - 46.0 %   MCV 94.5 80.0 - 100.0 fL   MCH 29.2 26.0 - 34.0 pg   MCHC 30.9 30.0 - 36.0 g/dL   RDW 14.6 11.5 - 15.5 %    Platelets 276 150 - 400 K/uL   nRBC 0.0 0.0 - 0.2 %   Neutrophils Relative % 81 %   Neutro Abs 16.2 (H) 1.7 - 7.7 K/uL   Lymphocytes Relative 11 %   Lymphs Abs 2.2 0.7 - 4.0 K/uL   Monocytes Relative 6 %   Monocytes Absolute 1.2 (H) 0.1 - 1.0 K/uL   Eosinophils Relative 1 %   Eosinophils Absolute 0.1 0.0 - 0.5 K/uL   Basophils Relative 0 %   Basophils Absolute 0.1 0.0 - 0.1 K/uL   Immature  Granulocytes 1 %   Abs Immature Granulocytes 0.10 (H) 0.00 - 0.07 K/uL    Comment: Performed at [facility], [place] [address] [place], [place] [address]  Comprehensive metabolic panel     Status: Abnormal   Collection Time: 12/09/20  9:29 PM  Result Value Ref Range   Sodium 138 135 - 145 mmol/L   Potassium 6.3 (HH) 3.5 - 5.1 mmol/L    Comment: NO VISIBLE HEMOLYSIS CRITICAL RESULT CALLED TO, READ BACK BY AND VERIFIED WITH: CRITCHON M,RN 12/09/20 2252 WAYK    Chloride 100 98 - 111 mmol/L   CO2 23 22 - 32 mmol/L   Glucose, Bld 146 (H) 70 - 99 mg/dL    Comment: Glucose reference range applies only to samples taken after fasting for at least 8 hours.   BUN 68 (H) 6 - 20 mg/dL   Creatinine, Ser 12.46 (H) 0.44 - 1.00 mg/dL   Calcium 9.5 8.9 - 10.3 mg/dL   Total Protein 7.3 6.5 - 8.1 g/dL   Albumin 3.5 3.5 - 5.0 g/dL   AST 37 15 - 41 U/L   ALT 38 0 - 44 U/L   Alkaline Phosphatase 128 (H) 38 - 126 U/L   Total Bilirubin 0.5 0.3 - 1.2 mg/dL   GFR, Estimated 3 (L) >60 mL/min    Comment: (NOTE) Calculated using the CKD-EPI Creatinine Equation (2021)    Anion gap 15 5 - 15    Comment: Performed at [facility] [address], [place], [place] [address]  Protime-INR     Status: None   Collection Time: 12/09/20  9:29 PM  Result Value Ref Range   Prothrombin Time 13.7 11.4 - 15.2 seconds   INR 1.1 0.8 - 1.2    Comment: (NOTE) INR goal varies based on device and disease states. Performed at [facility], [place] [address] [place], [place] [address]   Troponin I (High Sensitivity)      Status: Abnormal   Collection Time: 12/09/20  9:29 PM  Result Value Ref Range   Troponin I (High Sensitivity) 57 (H) <18 ng/L    Comment: (NOTE) Elevated high sensitivity troponin I (hsTnI) values and significant  changes across serial measurements may suggest ACS but many other  chronic and acute conditions are known to elevate hsTnI results.  Refer to the Links section for chest pain algorithms and additional  guidance. Performed at [facility], [place] [address] [place], [place] [address]   Brain natriuretic peptide     Status: Abnormal   Collection Time: 12/09/20  9:29 PM  Result Value Ref Range   B Natriuretic Peptide 855.0 (H) 0.0 - 100.0 pg/mL    Comment: Performed at [facility] [address], [place], [place] [address]  Lactic acid, plasma     Status: None   Collection Time: 12/09/20  9:29 PM  Result Value Ref Range   Lactic Acid, Venous 1.4 0.5 - 1.9 mmol/L    Comment: Performed at [facility] [address] [place], [place] [address]  I-Stat venous blood gas, [facility] ED only)     Status: Abnormal   Collection Time: 12/09/20 11:00 PM  Result Value Ref Range   pH, Ven 7.371 7.250 - 7.430   pCO2, Ven 44.0 44.0 - 60.0 mmHg   pO2, Ven 83.0 (H) 32.0 - 45.0 mmHg   Bicarbonate 25.5 20.0 - 28.0 mmol/L   TCO2 27 22 - 32 mmol/L  O2 Saturation 96.0 %   Acid-Base Excess 0.0 0.0 - 2.0 mmol/L   Sodium 138 135 - 145 mmol/L   Potassium 6.8 (HH) 3.5 - 5.1 mmol/L   Calcium, Ion 1.14 (L) 1.15 - 1.40 mmol/L   HCT 41.0 36.0 - 46.0 %   Hemoglobin 13.9 12.0 - 15.0 g/dL   Sample type VENOUS    Comment NOTIFIED PHYSICIAN   CBG monitoring, ED     Status: Abnormal   Collection Time: 12/09/20 11:22 PM  Result Value Ref Range   Glucose-Capillary 134 (H) 70 - 99 mg/dL    Comment: Glucose reference range applies only to samples taken after fasting for at least 8 hours.    DG Chest 1 View  Result Date: 12/09/2020 CLINICAL DATA:  Shortness of breath EXAM: CHEST  1  VIEW COMPARISON:  11/05/2020 FINDINGS: Single frontal view of the chest demonstrates an enlarged cardiac silhouette. There is increased central vascular prominence with interval development of bilateral interstitial opacities and trace effusions. No pneumothorax. IMPRESSION: 1. Worsening volume status, with interval development of interstitial edema and mild CHF. Electronically Signed   By: Randa Ngo M.D.   On: 12/09/2020 21:39    Assessment/Plan **hypoxic resp failure:  Appears volume overloaded but question if infectious issue also playing a role with T 100.7 and leukocytosis.  Resp viral panel pending currently.   Will plan for HD tonight and may need serial dialysis pending course; follows wts to determine new EDW.  Defer antimicrobials to primary.  Need COVID result prior to dialysis.  **Hyperkalemia:  S/p med management with insulin and dextrose; use 2K dialysate tonight.   **ESRD on HD: per above HD tonight. Needs renal diet with fluid restriction 1216mL. Daily wts and strict I/Os.  **HTN:  Markedly hypertensive but per her report it's nearly her baseline- reporting commonly in the 180-190s.  Has just been given NTG, resume home meds and UF with HD will help.   **Morbid obesity: had been going to weight loss clinic but says efforts have been abandoned.    **DM: diet controlled  Justin Mend 12/09/2020, 11:47 PM

## 2020-12-09 NOTE — ED Provider Notes (Signed)
[facility] EMERGENCY DEPARTMENT Provider Note   CSN: [id number] Arrival date & time: 12/09/20  2120     History Chief Complaint  Patient presents with  . Shortness of Breath    Pamela Campbell is a 59 y.o. female.  HPI   60 year old female who is chronically ill with past medical history of ESRD on HD TRS, HTN, DM, CAD, obesity presents to the emergency department in respiratory distress.  Report from EMS is that the call was from the patient's home for shortness of breath.  Family reports that she typically has "rough Mondays when she is predialysis for Tuesday but she is never had this much difficulty breathing before.  History is limited secondary to patient's acuity and arriving on CPAP.  Family denied any recent injury or acute illness.  Patient was reportedly hypoxic to the 70s on room air, tolerated CPAP well.  Received magnesium and a breathing treatment prior to arrival.  Past Medical History:  Diagnosis Date  . Anemia   . Chronic diastolic CHF (congestive heart failure) ([place])    a. 03/2015: echo w/ EF of 50-55%, no WMA, Grade 2 DD, trivial AI, mild MR.  . Chronic kidney disease (CKD), stage V ([place])    dialysis tuesday, thursday saterday  . Constipation   . Diabetes mellitus without complication (HCC)    CONTROLLED WITH DIET  . Dyspnea    non lately  . Edema, lower extremity   . Hypertension   . Overweight   . Pneumonia 2016   WHILE HOSPIATLIZED  . Shortness of breath     Patient Active Problem List   Diagnosis Date Noted  . Vitamin D deficiency 02/13/2019  . Postmenopausal bleeding 09/02/2018  . Acute on chronic diastolic CHF (congestive heart failure) ([place]) 12/31/2017  . Acute viral bronchitis   . Acute on chronic diastolic heart failure ([place]) 12/30/2017  . Cough in adult 12/30/2017  . Uncontrolled hypertension 12/30/2017  . Diabetes mellitus type 2 in obese ([place]) 12/30/2017  . Metabolic acidosis 16/55/3748  . Hypertensive urgency  10/11/2017  . Hepatitis C antibody test positive 12/08/2016  . Minor CAD    . Abnormal stress test   . Essential hypertension   . ESRD on dialysis ([place])   . Unstable angina pectoris ([place])   . Hypertensive emergency 10/24/2016  . Chest pain with high risk for cardiac etiology 10/24/2016  . HLD (hyperlipidemia) 07/17/2015  . Type 2 diabetes mellitus ([place]) 03/27/2015  . Chronic diastolic congestive heart failure ([place]) 03/25/2015  . Accelerated hypertension 03/25/2015  . Obesity BMI 45 03/25/2015  . Sleep apnea 03/25/2015  . Dyslipidemia 03/25/2015  . Elevated troponin-demand ischemia 03/24/2015  . Hypertensive heart disease 03/24/2015  . Shortness of breath 03/24/2015    Past Surgical History:  Procedure Laterality Date  . AV FISTULA PLACEMENT Right 01/04/2018   Procedure: ARTERIOVENOUS (AV) FISTULA CREATION RIGHT ARM;  Surgeon: Conrad [place]us, MD;  Location: [place];  Service: Vascular;  Laterality: Right;  . AV FISTULA PLACEMENT Right 01/24/2018   Procedure: ARTERIOVENOUS (AV) FISTULA CREATION RIGHT BRACHIOCEPHALIC;  Surgeon: Conrad [employer], MD;  Location: [facility];  Service: Vascular;  Laterality: Right;  . CARDIAC CATHETERIZATION N/A 11/02/2016   Procedure: Left Heart Cath and Coronary Angiography;  Surgeon: Sherren Mocha, MD;  Location: [facility];  Service: Cardiovascular;  Laterality: N/A;  . CESAREAN SECTION    . COLONOSCOPY    . DILATATION & CURETTAGE/HYSTEROSCOPY WITH MYOSURE N/A 09/02/2018   Procedure: DILATATION &  CURETTAGE/HYSTEROSCOPY WITH MYOSURE;  Surgeon: Christophe Louis, MD;  Location: [facility];  Service: Gynecology;  Laterality: N/A;  . FISTULA PLUG    . FISTULA SUPERFICIALIZATION Right 05/02/2018   Procedure: FISTULA SUPERFICIALIZATION RIGHT ARTERIOVENOUS FISTULA;  Surgeon: Conrad [place], MD;  Location: [facility];  Service: Vascular;  Laterality: Right;  . FRACTURE SURGERY     shoulder left  . INSERTION OF DIALYSIS CATHETER Right 01/04/2018   Procedure: INSERTION OF DIALYSIS  CATHETER RIGHT INTERNAL JUGULAR;  Surgeon: Conrad [place], MD;  Location: [facility];  Service: Vascular;  Laterality: Right;  . IR FLUORO GUIDE CV LINE RIGHT  12/31/2017  . IR US GUIDE VASC ACCESS RIGHT  12/31/2017  . left shoulder rotator    . LIGATION OF ARTERIOVENOUS  FISTULA Right 01/24/2018   Procedure: LIGATION OF  RIGHT RADIOCEPHALIC ARTERIOVENOUS  FISTULA;  Surgeon: Conrad [place], MD;  Location: [facility];  Service: Vascular;  Laterality: Right;  . TONSILECTOMY, ADENOIDECTOMY, BILATERAL MYRINGOTOMY AND TUBES    . TOOTH EXTRACTION    . TUBAL LIGATION       OB History    Gravida  3   Para  3   Term      Preterm      AB      Living        SAB      IAB      Ectopic      Multiple      Live Births              Family History  Problem Relation Age of Onset  . Diabetes Mellitus II Mother   . Hypertension Mother   . Hyperlipidemia Mother   . Kidney disease Mother   . Eating disorder Mother   . Obesity Mother   . Heart disease Father   . Heart attack Father 61  . Congestive Heart Failure Brother   . Congestive Heart Failure Son     Social History   Tobacco Use  . Smoking status: Former Smoker    Quit date: 03/21/1998    Years since quitting: 22.7  . Smokeless tobacco: Never Used  Vaping Use  . Vaping Use: Never used  Substance Use Topics  . Alcohol use: No  . Drug use: No    Home Medications Prior to Admission medications   Medication Sig Start Date End Date Taking? Authorizing Provider  amLODipine (NORVASC) 10 MG tablet Take 1 tablet (10 mg total) by mouth daily. 01/06/18   Nita Sells, MD  Coenzyme Q10 100 MG CPCR Take by mouth.    [provider]  labetalol (NORMODYNE) 100 MG tablet Take 100 mg by mouth 2 (two) times daily.    [provider]  lactulose (CHRONULAC) 10 GM/15ML solution Take by mouth 3 (three) times daily.    [provider]  lanthanum (FOSRENOL) 1000 MG chewable tablet Chew 1,000 mg by mouth 2 (two) times  daily with a meal.    [provider]  ondansetron (ZOFRAN-ODT) 4 MG disintegrating tablet Take 4 mg by mouth every 6 (six) hours as needed for nausea. 07/05/18   [provider]  Vitamin D, Ergocalciferol, (DRISDOL) 1.25 MG (50000 UT) CAPS capsule Take 1 capsule (50,000 Units total) by mouth every 14 (fourteen) days. 02/09/19   [place], Joneen Boers, FNP  calcium carbonate (OS-CAL) 1250 (500 Ca) MG chewable tablet Chew 1 tablet by mouth daily.  08/01/20  [provider]    Allergies  Compazine [prochlorperazine edisylate] and Vioxx [rofecoxib]  Review of Systems   Review of Systems  Unable to perform ROS: Acuity of condition    Physical Exam Updated Vital Signs BP (!) 212/106   Pulse 90   Temp (!) 100.7 F (38.2 C)   Resp (!) 28   Ht 5\' 4"  (1.626 m)   Wt 126 kg   LMP 02/02/2015   SpO2 100%   BMI 47.68 kg/m   Physical Exam Vitals and nursing note reviewed.  Constitutional:      General: She is in acute distress.     Appearance: Normal appearance. She is obese. She is diaphoretic.  HENT:     Head: Normocephalic.     Mouth/Throat:     Mouth: Mucous membranes are moist.  Cardiovascular:     Rate and Rhythm: Normal rate.  Pulmonary:     Effort: Tachypnea and accessory muscle usage present. No respiratory distress.     Breath sounds: Examination of the right-lower field reveals decreased breath sounds. Examination of the left-lower field reveals decreased breath sounds. Decreased breath sounds, rhonchi and rales present.  Chest:     Chest wall: No crepitus.  Abdominal:     Palpations: Abdomen is soft.     Tenderness: There is no abdominal tenderness.  Musculoskeletal:     Right lower leg: Edema present.     Left lower leg: Edema present.  Skin:    General: Skin is warm.  Neurological:     Mental Status: She is alert and oriented to person, place, and time. Mental status is at baseline.  Psychiatric:        Mood and Affect: Mood is anxious.      ED Results / Procedures / Treatments   Labs (all labs ordered are listed, but only abnormal results are displayed) Labs Reviewed  CULTURE, BLOOD (ROUTINE X 2)  CULTURE, BLOOD (ROUTINE X 2)  RESP PANEL BY RT-PCR (FLU A&B, COVID) ARPGX2  CBC WITH DIFFERENTIAL/PLATELET  COMPREHENSIVE METABOLIC PANEL  BLOOD GAS, VENOUS  PROTIME-INR  BRAIN NATRIURETIC PEPTIDE  LACTIC ACID, PLASMA  LACTIC ACID, PLASMA  TROPONIN I (HIGH SENSITIVITY)    EKG EKG Interpretation  Date/Time:  Monday December 09 2020 21:27:48 EST Ventricular Rate:  92 PR Interval:    QRS Duration: 105 QT Interval:  355 QTC Calculation: 440 R Axis:   -6 Text Interpretation: Sinus rhythm Probable left atrial enlargement Nonspecific T abnormalities, lateral leads NSR, nonspecific t wave changes present on previous Confirmed by Lavenia Atlas [phone]) on 12/09/2020 9:42:26 PM   Radiology DG Chest 1 View  Result Date: 12/09/2020 CLINICAL DATA:  Shortness of breath EXAM: CHEST  1 VIEW COMPARISON:  11/05/2020 FINDINGS: Single frontal view of the chest demonstrates an enlarged cardiac silhouette. There is increased central vascular prominence with interval development of bilateral interstitial opacities and trace effusions. No pneumothorax. IMPRESSION: 1. Worsening volume status, with interval development of interstitial edema and mild CHF. Electronically Signed   By: Randa Ngo M.D.   On: 12/09/2020 21:39    Procedures Procedures (including critical care time)  Medications Ordered in ED Medications - No data to display  ED Course  I have reviewed the triage vital signs and the nursing notes.  Pertinent labs & imaging results that were available during my care of the patient were reviewed by me and considered in my medical decision making (see chart for details).  Clinical Course as of 12/09/20 2355  Mon Dec 09, 2020  2149 EKG is  normal sinus rhythm, nonspecific T wave abnormalities which is been present on  previous, no acute ischemic changes.  Portable chest x-ray shows worsening volume status with pulmonary edema. [GL]  8756 Blood work shows a potassium of 6.8, no EKG changes in regards to this.  Troponin and BNP are both elevated.  Patient is being given medicated for pulmonary edema as well as hyper K treatment.  On-call nephrologist has been contacted and aware of patient's status. [KH]    Clinical Course User Index [KH] Antone Summons, Alvin Critchley, DO   MDM Rules/Calculators/A&P                          59 year old female arrives in respiratory distress on CPAP.  Reportedly hypoxic to the 70s on room air.  She is due for dialysis tomorrow, reportedly compliant with dialysis last week.  She has a low-grade fever, she is hypertensive on arrival.  Transition to our BiPAP machine, diffuse rales and rhonchi bilaterally, respiratory drive improved on BiPAP, oxygenation 100%, patient mentating normally, answering questions and following commands.  Patient's work-up is consistent with fluid overload, still tolerating the BiPAP well.  Chest x-ray shows pulmonary edema, potassium is 6.3, no EKG changes.  Patient has been medicated for pulmonary edema and hyperkalemia.  Nephrology is aware and will evaluate the patient, Covid swab is pending.  Patient admitted to hospitalist.  Final Clinical Impression(s) / ED Diagnoses Final diagnoses:  None    Rx / DC Orders ED Discharge Orders    None       Lorelle Gibbs, DO 12/09/20 2356

## 2020-12-09 NOTE — ED Notes (Signed)
Unable to obtain second set of cultures at this time 

## 2020-12-09 NOTE — ED Triage Notes (Signed)
Pt bib gems c/o sob onset today. Ems reports initial o2 sat in the 70's and improved to 99% on CPAP. T, Th, S dialysis. 2g mag, duoneb  and .4mg  nitro given PTA.   BP: 240/140

## 2020-12-09 NOTE — H&P (Signed)
Pamela Campbell ZDG:[id number] DOB: Feb 23, 1961 DOA: 12/09/2020     PCP: [facility] [place]   Outpatient Specialists:     NEphrology:  [facility]    Patient arrived to ER on 12/09/20 at 2120 Referred by Attending Horton, Alvin Critchley, DO   Patient coming from: home Lives   With family   Chief Complaint:   Chief Complaint  Patient presents with  . Shortness of Breath    HPI: Pamela Campbell is a 59 y.o. female with medical history significant of Obesity, ESRD on Tuesday Thursday and Saturday, anemia, chronic diastolic CHF, constipation, diabetes mellitus diet controlled, hypertension    Presented with worsening shortness of breath started today on EMS arrival O2 sats in the 70s improved 5-99 on CPAP Initially on the blood pressure on arrival 2 4140 patient was given nitro DuoNeb and 2 g of magnesium in route .  EMS family reported that she usually has rough Mondays prior to dialysis on Tuesday but usually not that bad. She denies any sick contacts No pain no chest pain does endorse some shortness of breath   Infectious risk factors:  Reports shortness of breath, dry cough     Has  been vaccinated against COVID and boosted   Initial COVID TEST  NEGATIVE   Lab Results  Component Value Date   [place] NEGATIVE 12/09/2020    Regarding pertinent Chronic problems:      HTN on Norvasc labetalol At baseline her blood pressure runs 160s over 80s     DM 2 -  Lab Results  Component Value Date   HGBA1C 7.4 (H) 12/30/2017    diet controlled    Morbid obesity-   BMI Readings from Last 1 Encounters:  12/09/20 47.68 kg/m       ESRD on HD T, H Sat Estimated Creatinine Clearance: 6.5 mL/min (A) (by C-G formula based on SCr of 12.46 mg/dL (H)).  Lab Results  Component Value Date   CREATININE 12.46 (H) 12/09/2020   CREATININE 13.00 (H) 08/13/2020   CREATININE 6.62 (H) 11/14/2018       Chronic anemia - baseline hg Hemoglobin & Hematocrit   Recent Labs    08/13/20 1401 12/09/20 2129 12/09/20 2300  HGB 10.8* 11.2* 13.9     While in ER: Started on BiPAP and improved respiratory distress Started on nitro paste Hyper K was treated With glucose insulin albuterol, bicarb  ER Provider Called: Nephrology    Dr.Kruska  They Recommend admit to medicine plan for hemodialysis tonight Will see   in ER  Hospitalist was called for admission for fluid overload in the setting of end-stage renal disease  The following Work up has been ordered so far:  Orders Placed This Encounter  Procedures  . Culture, blood (routine x 2)  . Resp Panel by RT-PCR (Flu A&B, Covid) Nasopharyngeal Swab  . DG Chest 1 View  . CBC with Differential  . Comprehensive metabolic panel  . Protime-INR  . Brain natriuretic peptide  . Lactic acid, plasma  . Place Patient on a Cardiac Monitor  . Initiate Carrier Fluid Protocol  . Consult to hospitalist  ALL PATIENTS BEING ADMITTED/HAVING PROCEDURES NEED COVID-19 SCREENING  . Consult to nephrology  ALL PATIENTS BEING ADMITTED/HAVING PROCEDURES NEED COVID-19 SCREENING  . Bipap  . I-Stat venous blood gas, [facility] ED only)  . CBG monitoring, ED  . EKG 12-Lead     Following Medications were ordered in ER: Medications  methylPREDNISolone sodium succinate (  SOLU-MEDROL) 125 mg/2 mL injection 125 mg (125 mg Intravenous Given 12/09/20 2231)  nitroGLYCERIN (NITROGLYN) 2 % ointment 1 inch (1 inch Topical Given 12/09/20 2231)  furosemide (LASIX) injection 40 mg (40 mg Intravenous Given 12/09/20 2325)  insulin aspart (novoLOG) injection 5 Units (5 Units Intravenous Given 12/09/20 2338)    And  dextrose 50 % solution 50 mL (50 mLs Intravenous Given 12/09/20 2335)  sodium bicarbonate injection 50 mEq (50 mEq Intravenous Given 12/09/20 2326)  ondansetron (ZOFRAN) injection 4 mg (4 mg Intravenous Given 12/09/20 2343)        Consult Orders  (From admission, onward)         Start     Ordered   12/09/20 2303   Consult to hospitalist  ALL PATIENTS BEING ADMITTED/HAVING PROCEDURES NEED COVID-19 SCREENING Paged, Sianna  Once       Comments: ALL PATIENTS BEING ADMITTED/HAVING PROCEDURES NEED COVID-19 SCREENING  Provider:  (Not yet assigned)  Question Answer Comment  Place call to: [facility]   Reason for Consult Admit      12/09/20 2302           Significant initial  Findings: Abnormal Labs Reviewed  CBC WITH DIFFERENTIAL/PLATELET - Abnormal; Notable for the following components:      Result Value   WBC 19.8 (*)    RBC 3.83 (*)    Hemoglobin 11.2 (*)    Neutro Abs 16.2 (*)    Monocytes Absolute 1.2 (*)    Abs Immature Granulocytes 0.10 (*)    All other components within normal limits  COMPREHENSIVE METABOLIC PANEL - Abnormal; Notable for the following components:   Potassium 6.3 (*)    Glucose, Bld 146 (*)    BUN 68 (*)    Creatinine, Ser 12.46 (*)    Alkaline Phosphatase 128 (*)    GFR, Estimated 3 (*)    All other components within normal limits  BRAIN NATRIURETIC PEPTIDE - Abnormal; Notable for the following components:   B Natriuretic Peptide 855.0 (*)    All other components within normal limits  I-STAT VENOUS BLOOD GAS, ED - Abnormal; Notable for the following components:   pO2, Ven 83.0 (*)    Potassium 6.8 (*)    Calcium, Ion 1.14 (*)    All other components within normal limits  CBG MONITORING, ED - Abnormal; Notable for the following components:   Glucose-Capillary 134 (*)    All other components within normal limits  TROPONIN I (HIGH SENSITIVITY) - Abnormal; Notable for the following components:   Troponin I (High Sensitivity) 57 (*)    All other components within normal limits    Otherwise labs showing:  Recent Labs  Lab 12/09/20 2129 12/09/20 2300  NA 138 138  K 6.3* 6.8*  CO2 23  --   GLUCOSE 146*  --   BUN 68*  --   CREATININE 12.46*  --   CALCIUM 9.5  --     Cr   Up from baseline see below Lab Results  Component Value Date   CREATININE  12.46 (H) 12/09/2020   CREATININE 13.00 (H) 08/13/2020   CREATININE 6.62 (H) 11/14/2018    Recent Labs  Lab 12/09/20 2129  AST 37  ALT 38  ALKPHOS 128*  BILITOT 0.5  PROT 7.3  ALBUMIN 3.5   Lab Results  Component Value Date   CALCIUM 9.5 12/09/2020   PHOS 6.2 (H) 01/05/2018      WBC      Component Value Date/Time  WBC 19.8 (H) 12/09/2020 2129   LYMPHSABS 2.2 12/09/2020 2129   LYMPHSABS 3.5 (H) 11/14/2018 1107   MONOABS 1.2 (H) 12/09/2020 2129   EOSABS 0.1 12/09/2020 2129   EOSABS 0.3 11/14/2018 1107   BASOSABS 0.1 12/09/2020 2129   BASOSABS 0.1 11/14/2018 1107     Plt: Lab Results  Component Value Date   PLT 276 12/09/2020    Lactic Acid, Venous    Component Value Date/Time   LATICACIDVEN 1.4 12/09/2020 2129    Procalcitonin   Ordered     Lab Results  Component Value Date   SARSCOV2NAA NEGATIVE 12/09/2020      Venous  Blood Gas result:  pH  7.371  PCO2 44    ABG    Component Value Date/Time   HCO3 25.5 12/09/2020 2300   TCO2 27 12/09/2020 2300   O2SAT 96.0 12/09/2020 2300    HG/HCT   stable,      Component Value Date/Time   HGB 13.9 12/09/2020 2300   HGB 12.0 11/14/2018 1107   HCT 41.0 12/09/2020 2300   HCT 36.1 11/14/2018 1107   MCV 94.5 12/09/2020 2129   MCV 86 11/14/2018 1107    No results for input(s): LIPASE, AMYLASE in the last 168 hours. No results for input(s): AMMONIA in the last 168 hours.     Troponin 57   ECG: Ordered Personally reviewed by me showing: HR : 92 Rhythm:  NSR, probably left atrial enlargement  nonspecific changes  QTC 440   BNP (last 3 results) Recent Labs    12/09/20 2129  BNP 855.0*      DM  labs:  HbA1C: No results for input(s): HGBA1C in the last 8760 hours.     CBG (last 3)  Recent Labs    12/09/20 2322  GLUCAP 134*       UA ordered        Ordered    CXR - mild CHF     ED Triage Vitals  Enc Vitals Group     BP 12/09/20 2130 (!) 212/106     Pulse Rate 12/09/20 2130 90      Resp 12/09/20 2130 (!) 28     Temp 12/09/20 2126 (!) 100.7 F (38.2 C)     Temp src --      SpO2 12/09/20 2130 100 %     Weight 12/09/20 2129 277 lb 12.5 oz (126 kg)     Height 12/09/20 2129 '5\' 4"'  (1.626 m)     Head Circumference --      Peak Flow --      Pain Score --      Pain Loc --      Pain Edu? --      Excl. in [place]? --   TMAX(24)@       Latest  Blood pressure (!) 229/89, pulse 86, temperature (!) 100.7 F (38.2 C), resp. rate (!) 26, height '5\' 4"'  (1.626 m), weight 126 kg, last menstrual period 02/02/2015, SpO2 100 %.     Review of Systems:    Pertinent positives include:    shortness of breath at rest.  dyspnea on exertion, Constitutional:  No weight loss, night sweats, Fevers, chills, fatigue, weight loss  HEENT:  No headaches, Difficulty swallowing,Tooth/dental problems,Sore throat,  No sneezing, itching, ear ache, nasal congestion, post nasal drip,  Cardio-vascular:  No chest pain, Orthopnea, PND, anasarca, dizziness, palpitations.no Bilateral lower extremity swelling  GI:  No heartburn, indigestion, abdominal pain, nausea, vomiting, diarrhea, change in  bowel habits, loss of appetite, melena, blood in stool, hematemesis Resp:     No excess mucus, no productive cough, No non-productive cough, No coughing up of blood.No change in color of mucus.No wheezing. Skin:  no rash or lesions. No jaundice GU:  no dysuria, change in color of urine, no urgency or frequency. No straining to urinate.  No flank pain.  Musculoskeletal:  No joint pain or no joint swelling. No decreased range of motion. No back pain.  Psych:  No change in mood or affect. No depression or anxiety. No memory loss.  Neuro: no localizing neurological complaints, no tingling, no weakness, no double vision, no gait abnormality, no slurred speech, no confusion  All systems reviewed and apart from [place] all are negative  Past Medical History:   Past Medical History:  Diagnosis Date  . Anemia   .  Chronic diastolic CHF (congestive heart failure) ([place])    a. 03/2015: echo w/ EF of 50-55%, no WMA, Grade 2 DD, trivial AI, mild MR.  . Chronic kidney disease (CKD), stage V ([place])    dialysis tuesday, thursday saterday  . Constipation   . Diabetes mellitus without complication (HCC)    CONTROLLED WITH DIET  . Dyspnea    non lately  . Edema, lower extremity   . Hypertension   . Overweight   . Pneumonia 2016   WHILE HOSPIATLIZED  . Shortness of breath       Past Surgical History:  Procedure Laterality Date  . AV FISTULA PLACEMENT Right 01/04/2018   Procedure: ARTERIOVENOUS (AV) FISTULA CREATION RIGHT ARM;  Surgeon: Conrad Arthur, MD;  Location: [facility];  Service: Vascular;  Laterality: Right;  . AV FISTULA PLACEMENT Right 01/24/2018   Procedure: ARTERIOVENOUS (AV) FISTULA CREATION RIGHT BRACHIOCEPHALIC;  Surgeon: Conrad Leander, MD;  Location: [facility];  Service: Vascular;  Laterality: Right;  . CARDIAC CATHETERIZATION N/A 11/02/2016   Procedure: Left Heart Cath and Coronary Angiography;  Surgeon: Sherren Mocha, MD;  Location: [facility];  Service: Cardiovascular;  Laterality: N/A;  . CESAREAN SECTION    . COLONOSCOPY    . DILATATION & CURETTAGE/HYSTEROSCOPY WITH MYOSURE N/A 09/02/2018   Procedure: DILATATION & CURETTAGE/HYSTEROSCOPY WITH MYOSURE;  Surgeon: Christophe Louis, MD;  Location: [facility];  Service: Gynecology;  Laterality: N/A;  . FISTULA PLUG    . FISTULA SUPERFICIALIZATION Right 05/02/2018   Procedure: FISTULA SUPERFICIALIZATION RIGHT ARTERIOVENOUS FISTULA;  Surgeon: Conrad [place]on, MD;  Location: [facility];  Service: Vascular;  Laterality: Right;  . FRACTURE SURGERY     shoulder left  . INSERTION OF DIALYSIS CATHETER Right 01/04/2018   Procedure: INSERTION OF DIALYSIS CATHETER RIGHT INTERNAL JUGULAR;  Surgeon: Conrad Drain, MD;  Location: [facility];  Service: Vascular;  Laterality: Right;  . IR FLUORO GUIDE CV LINE RIGHT  12/31/2017  . IR US GUIDE VASC ACCESS RIGHT  12/31/2017  . left  shoulder rotator    . LIGATION OF ARTERIOVENOUS  FISTULA Right 01/24/2018   Procedure: LIGATION OF  RIGHT RADIOCEPHALIC ARTERIOVENOUS  FISTULA;  Surgeon: Conrad Stanley, MD;  Location: [facility];  Service: Vascular;  Laterality: Right;  . TONSILECTOMY, ADENOIDECTOMY, BILATERAL MYRINGOTOMY AND TUBES    . TOOTH EXTRACTION    . TUBAL LIGATION      Social History:  Ambulatory   Independently     reports that she quit smoking about 22 years ago. She has never used smokeless tobacco. She reports that she does not drink alcohol and does  not use drugs.   Family History:   Family History  Problem Relation Age of Onset  . Diabetes Mellitus II Mother   . Hypertension Mother   . Hyperlipidemia Mother   . Kidney disease Mother   . Eating disorder Mother   . Obesity Mother   . Heart disease Father   . Heart attack Father 84  . Congestive Heart Failure Brother   . Congestive Heart Failure Son     Allergies: Allergies  Allergen Reactions  . Compazine [Prochlorperazine Edisylate] Swelling    Tongue  Hardens and comes out of mouth (Tardive dyskinesia)  . Vioxx [Rofecoxib] Other (See Comments)    Bleeding     Prior to Admission medications   Medication Sig Start Date End Date Taking? Authorizing Provider  amLODipine (NORVASC) 10 MG tablet Take 1 tablet (10 mg total) by mouth daily. 01/06/18   Nita Sells, MD  Coenzyme Q10 100 MG CPCR Take by mouth.    [provider]  labetalol (NORMODYNE) 100 MG tablet Take 100 mg by mouth 2 (two) times daily.    [provider]  lactulose (CHRONULAC) 10 GM/15ML solution Take by mouth 3 (three) times daily.    [provider]  lanthanum (FOSRENOL) 1000 MG chewable tablet Chew 1,000 mg by mouth 2 (two) times daily with a meal.    [provider]  ondansetron (ZOFRAN-ODT) 4 MG disintegrating tablet Take 4 mg by mouth every 6 (six) hours as needed for nausea. 07/05/18   [provider]  Vitamin D,  Ergocalciferol, (DRISDOL) 1.25 MG (50000 UT) CAPS capsule Take 1 capsule (50,000 Units total) by mouth every 14 (fourteen) days. 02/09/19   [place], Joneen Boers, FNP  calcium carbonate (OS-CAL) 1250 (500 Ca) MG chewable tablet Chew 1 tablet by mouth daily.  08/01/20  [provider]   Physical Exam: Vitals with BMI 12/09/2020 12/09/2020 12/09/2020  Height - - -  Weight - - -  BMI - - -  Systolic 992 426 834  Diastolic 89 88 196  Pulse 86 85 87     1. General:  in No  Acute distress    Chronically ill  -appearing 2. Psychological: Alert and  Oriented 3. Head/ENT:   Moist   Mucous Membranes                          Head Non traumatic, neck supple                          Poor Dentition 4. SKIN:  Normal Skin turgor,  Skin clean Dry and intact no rash 5. Heart: Regular rate and rhythm no Murmur, no Rub or gallop 6. Lungs:  no wheezes or crackles   7. Abdomen: Soft,  non-tender, Non distended  obese  bowel sounds present 8. Lower extremities: no clubbing, cyanosis, trace edema 9. Neurologically Grossly intact, moving all 4 extremities equally   10. MSK: Normal range of motion   All other LABS:     Recent Labs  Lab 12/09/20 2129 12/09/20 2300  WBC 19.8*  --   NEUTROABS 16.2*  --   HGB 11.2* 13.9  HCT 36.2 41.0  MCV 94.5  --   PLT 276  --      Recent Labs  Lab 12/09/20 2129 12/09/20 2300  NA 138 138  K 6.3* 6.8*  CL 100  --   CO2 23  --  GLUCOSE 146*  --   BUN 68*  --   CREATININE 12.46*  --   CALCIUM 9.5  --      Recent Labs  Lab 12/09/20 2129  AST 37  ALT 38  ALKPHOS 128*  BILITOT 0.5  PROT 7.3  ALBUMIN 3.5       Cultures:    Component Value Date/Time   SDES URINE, RANDOM 10/27/2016 1136   SDES URINE, CLEAN CATCH 10/27/2016 1136   SPECREQUEST NONE 10/27/2016 1136   SPECREQUEST NONE 10/27/2016 1136   CULT MULTIPLE SPECIES PRESENT, SUGGEST RECOLLECTION (A) 10/27/2016 1136   REPTSTATUS 10/29/2016 FINAL 10/27/2016 1136   REPTSTATUS 10/27/2016  FINAL 10/27/2016 1136     Radiological Exams on Admission: DG Chest 1 View  Result Date: 12/09/2020 CLINICAL DATA:  Shortness of breath EXAM: CHEST  1 VIEW COMPARISON:  11/05/2020 FINDINGS: Single frontal view of the chest demonstrates an enlarged cardiac silhouette. There is increased central vascular prominence with interval development of bilateral interstitial opacities and trace effusions. No pneumothorax. IMPRESSION: 1. Worsening volume status, with interval development of interstitial edema and mild CHF. Electronically Signed   By: Randa Ngo M.D.   On: 12/09/2020 21:39    Chart has been reviewed   Assessment/Plan   59 y.o. female with medical history significant of Obesity, ESRD on Tuesday Thursday and Saturday, anemia, chronic diastolic CHF, constipation, diabetes mellitus diet controlled, hypertension  Admitted for acute respiratory failure with hypoxia secondary to fluid overload in setting of end-stage renal disease as well as SIRS  Present on Admission: . Acute respiratory failure with hypoxia (HCC) -most likely secondary to fluid overload in the setting of end-stage renal disease.  Plan for hemodialysis tonight  SIRS -   -SIRS criteria met with elevated white blood cell count,       Component Value Date/Time   WBC 19.8 (H) 12/09/2020 2129   LYMPHSABS 2.2 12/09/2020 2129   LYMPHSABS 3.5 (H) 11/14/2018 1107    fever RR >20 Today's Vitals   12/09/20 2245 12/09/20 2300 12/09/20 2330 12/10/20 0015  BP: (!) 217/88 (!) 229/89 (!) 198/161 (!) 189/87  Pulse: 85 86 82 87  Resp: (!) 27 (!) 26 (!) 28 (!) 27  Temp:      SpO2: 100% 100% 98% 99%  Weight:      Height:         -Most likely source being: Source of sepsis is unknown but given clinical picture will continue to treat   Patient meeting criteria for Severe sepsis with    evidence of end organ damage/organ dysfunction such as     Acute hypoxia requiring new supplemental oxygen, SpO2: 99 % FiO2 (%): 40 %       - Obtain serial lactic acid and procalcitonin level.  - Initiated IV antibiotics Rocephin azithromycin/vancomycin given patient end-stage renal disease with IV access MRSA screening ordered Respiratory panel ordered  - await results of blood and urine culture No evidence of hypotension will hold off on aggressive fluid resuscitation   12:51 AM   . Obesity BMI 45 will need outpatient follow-up nutritional consult  . Hypertensive emergency -plan for hemodialysis tonight.  Resume home medications when able to tolerate thereafter  . HLD (hyperlipidemia) -chronic stable resume home medications when able to tolerate  . Elevated troponin-demand ischemia - -no chest pain no EKG changes in the setting of  increased work of breathing, hypertensive emergency and  chronic kidney disease likely due to demand ischemia and poor clearance, monitor  on telemetry and cycle cardiac enzymes to trend.  if continues to rise will need further work-up   . Diabetes mellitus type 2 in obese ([place]) -  - Order ESRD/ Sensitive SSI   -  check TSH and HgA1C   . Acute on chronic diastolic heart failure (HCC) -dose of Lasix given in emergency department nephrology notified plan to provide dialysis tonight which would help with fluid status. Obtain echogram given elevated troponin  . Hyperkalemia-treated initially in emergency department with glucose/insulin, bicarb, nephrology is aware patient is to undergo dialysis tonight No ECG changes Follow-up potassium postdialysis  Other plan as per orders.  DVT prophylaxis:  Hep [place]     Code Status:    Code Status: Prior FULL CODE  as per patient   I had personally discussed CODE STATUS with patient     Family Communication:   Family not at  Bedside   Disposition Plan:     To home once workup is complete and patient is stable   Following barriers for discharge:                            Electrolytes corrected                                                              Afebrile, white count improving able to transition to PO antibiotics                             Will need to be able to tolerate PO                                                        Will need consultants to evaluate patient prior to discharge                       Would benefit from PT/OT eval prior to DC  Ordered                                      Diabetes care coordinator                                       Consults called: nephrology    Admission status:  ED Disposition    ED Disposition Condition [place]: [facility] [[id number]]  Level of Care: Progressive [102]  Admit to Progressive based on following criteria: RESPIRATORY PROBLEMS hypoxemic/hypercapnic respiratory failure that is responsive to NIPPV (BiPAP) or High Flow Nasal Cannula (6-80 lpm). Frequent assessment/intervention, no > Q2 hrs < Q4 hrs, to maintain oxygenation and pulmonary hygiene.  May admit patient to [name] or [facility] if equivalent level of care is available:: No  Covid Evaluation: Confirmed COVID Negative  Diagnosis: Acute respiratory failure with hypoxia [facility]) [[id number]]  Admitting  Physician: Toy Baker [[id number]]  Attending Physician: Toy Baker [[id number]]  Estimated length of stay: past midnight tomorrow  Certification:: I certify this patient will need inpatient services for at least 2 midnights         inpatient     I Expect 2 midnight stay secondary to severity of patient's current illness need for inpatient interventions justified by the following:  hemodynamic instability despite optimal treatment ( hypertension  tachypnea  hypoxia,  )   Severe lab/radiological/exam abnormalities including:    fluid overload. SIRS and extensive comorbidities including:    DM2     Morbid Obesity ESRD .    That are currently affecting medical management.   I expect  patient to be hospitalized for 2 midnights requiring inpatient medical  care.  Patient is at high risk for adverse outcome (such as loss of life or disability) if not treated.  Indication for inpatient stay as follows:    Hemodynamic instability despite maximal medical therapy,    inability to maintain oral hydration    Need for operative/procedural  intervention New or worsening hypoxia  Need for IV antibiotics BIPAP    Level of care    SDU tele indefinitely please discontinue once patient no longer qualifies COVID-19 Labs    Lab Results  Component Value Date   [place] NEGATIVE 12/09/2020     Precautions: admitted as  Covid Negative    PPE: Used by the provider:    P100  eye Goggles,  Gloves  gown   Shayan Bramhall 12/10/2020, 12:29 AM    [facility]     after 2 AM please page floor coverage PA If 7AM-7PM, please contact the day team taking care of the patient using [email or web address]   Patient was evaluated in the context of the global COVID-19 pandemic, which necessitated consideration that the patient might be at risk for infection with the SARS-CoV-2 virus that causes COVID-19. Institutional protocols and algorithms that pertain to the evaluation of patients at risk for COVID-19 are in a state of rapid change based on information released by regulatory bodies including the CDC and federal and state organizations. These policies and algorithms were followed during the patient's care.

## 2020-12-10 ENCOUNTER — Encounter (HOSPITAL_COMMUNITY): Payer: Self-pay | Admitting: Internal Medicine

## 2020-12-10 ENCOUNTER — Other Ambulatory Visit: Payer: Self-pay

## 2020-12-10 ENCOUNTER — Inpatient Hospital Stay (HOSPITAL_COMMUNITY): Payer: Medicare Other

## 2020-12-10 DIAGNOSIS — I5031 Acute diastolic (congestive) heart failure: Secondary | ICD-10-CM

## 2020-12-10 DIAGNOSIS — R6510 Systemic inflammatory response syndrome (SIRS) of non-infectious origin without acute organ dysfunction: Secondary | ICD-10-CM | POA: Diagnosis present

## 2020-12-10 DIAGNOSIS — E875 Hyperkalemia: Secondary | ICD-10-CM | POA: Diagnosis present

## 2020-12-10 LAB — CBC WITH DIFFERENTIAL/PLATELET
Abs Immature Granulocytes: 0.07 10*3/uL (ref 0.00–0.07)
Basophils Absolute: 0 10*3/uL (ref 0.0–0.1)
Basophils Relative: 0 %
Eosinophils Absolute: 0 10*3/uL (ref 0.0–0.5)
Eosinophils Relative: 0 %
HCT: 33.5 % — ABNORMAL LOW (ref 36.0–46.0)
Hemoglobin: 10.5 g/dL — ABNORMAL LOW (ref 12.0–15.0)
Immature Granulocytes: 1 %
Lymphocytes Relative: 8 %
Lymphs Abs: 1 10*3/uL (ref 0.7–4.0)
MCH: 29.5 pg (ref 26.0–34.0)
MCHC: 31.3 g/dL (ref 30.0–36.0)
MCV: 94.1 fL (ref 80.0–100.0)
Monocytes Absolute: 0.1 10*3/uL (ref 0.1–1.0)
Monocytes Relative: 1 %
Neutro Abs: 11.4 10*3/uL — ABNORMAL HIGH (ref 1.7–7.7)
Neutrophils Relative %: 90 %
Platelets: 246 10*3/uL (ref 150–400)
RBC: 3.56 MIL/uL — ABNORMAL LOW (ref 3.87–5.11)
RDW: 14.6 % (ref 11.5–15.5)
WBC: 12.6 10*3/uL — ABNORMAL HIGH (ref 4.0–10.5)
nRBC: 0 % (ref 0.0–0.2)

## 2020-12-10 LAB — RESPIRATORY PANEL BY PCR

## 2020-12-10 LAB — TROPONIN I (HIGH SENSITIVITY): Troponin I (High Sensitivity): 70 ng/L — ABNORMAL HIGH (ref <18)

## 2020-12-10 LAB — RENAL FUNCTION PANEL
Albumin: 3.5 g/dL (ref 3.5–5.0)
Anion gap: 19 — ABNORMAL HIGH (ref 5–15)
BUN: 34 mg/dL — ABNORMAL HIGH (ref 6–20)
CO2: 22 mmol/L (ref 22–32)
Calcium: 9.1 mg/dL (ref 8.9–10.3)
Chloride: 98 mmol/L (ref 98–111)
Creatinine, Ser: 7.56 mg/dL — ABNORMAL HIGH (ref 0.44–1.00)
GFR, Estimated: 6 mL/min — ABNORMAL LOW (ref >60)
Glucose, Bld: 157 mg/dL — ABNORMAL HIGH (ref 70–99)
Phosphorus: 6.8 mg/dL — ABNORMAL HIGH (ref 2.5–4.6)
Potassium: 5.2 mmol/L — ABNORMAL HIGH (ref 3.5–5.1)
Sodium: 139 mmol/L (ref 135–145)

## 2020-12-10 LAB — ECHOCARDIOGRAM COMPLETE
Area-P 1/2: 3.37 cm2
Calc EF: 58.6 %
Height: 64 in
S' Lateral: 3.1 cm
Single Plane A2C EF: 62.4 %
Single Plane A4C EF: 54 %
Weight: 4444.47 oz

## 2020-12-10 LAB — RESP PANEL BY RT-PCR (FLU A&B, COVID) ARPGX2
Influenza A by PCR: NEGATIVE
Influenza B by PCR: NEGATIVE
SARS Coronavirus 2 by RT PCR: NEGATIVE

## 2020-12-10 LAB — PHOSPHORUS: Phosphorus: 5.8 mg/dL — ABNORMAL HIGH (ref 2.5–4.6)

## 2020-12-10 LAB — GLUCOSE, CAPILLARY
Glucose-Capillary: 146 mg/dL — ABNORMAL HIGH (ref 70–99)
Glucose-Capillary: 192 mg/dL — ABNORMAL HIGH (ref 70–99)
Glucose-Capillary: 262 mg/dL — ABNORMAL HIGH (ref 70–99)
Glucose-Capillary: 239 mg/dL — ABNORMAL HIGH (ref 70–99)

## 2020-12-10 LAB — HEPATITIS B SURFACE ANTIGEN: Hepatitis B Surface Ag: NONREACTIVE

## 2020-12-10 LAB — COMPREHENSIVE METABOLIC PANEL
ALT: 34 U/L (ref 0–44)
AST: 26 U/L (ref 15–41)
Albumin: 3.5 g/dL (ref 3.5–5.0)
Alkaline Phosphatase: 118 U/L (ref 38–126)
Anion gap: 17 — ABNORMAL HIGH (ref 5–15)
BUN: 71 mg/dL — ABNORMAL HIGH (ref 6–20)
CO2: 22 mmol/L (ref 22–32)
Calcium: 9.5 mg/dL (ref 8.9–10.3)
Chloride: 102 mmol/L (ref 98–111)
Creatinine, Ser: 12.78 mg/dL — ABNORMAL HIGH (ref 0.44–1.00)
GFR, Estimated: 3 mL/min — ABNORMAL LOW (ref >60)
Glucose, Bld: 175 mg/dL — ABNORMAL HIGH (ref 70–99)
Potassium: 7.4 mmol/L (ref 3.5–5.1)
Sodium: 141 mmol/L (ref 135–145)
Total Bilirubin: 0.6 mg/dL (ref 0.3–1.2)
Total Protein: 7.7 g/dL (ref 6.5–8.1)

## 2020-12-10 LAB — MRSA PCR SCREENING: MRSA by PCR: NEGATIVE

## 2020-12-10 LAB — PROCALCITONIN: Procalcitonin: 0.4 ng/mL

## 2020-12-10 LAB — PROTIME-INR
INR: 1.2 (ref 0.8–1.2)
Prothrombin Time: 14.5 seconds (ref 11.4–15.2)

## 2020-12-10 LAB — HIV ANTIBODY (ROUTINE TESTING W REFLEX): HIV Screen 4th Generation wRfx: NONREACTIVE

## 2020-12-10 LAB — MAGNESIUM: Magnesium: 2.9 mg/dL — ABNORMAL HIGH (ref 1.7–2.4)

## 2020-12-10 LAB — HEMOGLOBIN A1C
Hgb A1c MFr Bld: 6.8 % — ABNORMAL HIGH (ref 4.8–5.6)
Mean Plasma Glucose: 148.46 mg/dL

## 2020-12-10 LAB — CBG MONITORING, ED
Glucose-Capillary: 165 mg/dL — ABNORMAL HIGH (ref 70–99)
Glucose-Capillary: 171 mg/dL — ABNORMAL HIGH (ref 70–99)

## 2020-12-10 LAB — LACTIC ACID, PLASMA: Lactic Acid, Venous: 1 mmol/L (ref 0.5–1.9)

## 2020-12-10 LAB — TSH: TSH: 1.331 u[IU]/mL (ref 0.350–4.500)

## 2020-12-10 MED ORDER — SODIUM CHLORIDE 0.9 % IV SOLN: 100.0000 mL | INTRAVENOUS | Status: DC | PRN

## 2020-12-10 MED ORDER — ACETAMINOPHEN 650 MG RE SUPP: 650.0000 mg | Freq: Four times a day (QID) | RECTAL | Status: DC | PRN

## 2020-12-10 MED ORDER — HYDROCODONE-ACETAMINOPHEN 5-325 MG PO TABS
1.0000 | ORAL_TABLET | ORAL | Status: DC | PRN
  Filled 2020-12-10: qty 1

## 2020-12-10 MED ORDER — PROSOURCE PLUS PO LIQD: 30.0000 mL | Freq: Two times a day (BID) | ORAL | Status: DC

## 2020-12-10 MED ORDER — LIDOCAINE-PRILOCAINE 2.5-2.5 % EX CREA: 1.0000 "application " | TOPICAL_CREAM | CUTANEOUS | Status: DC | PRN

## 2020-12-10 MED ORDER — PENTAFLUOROPROP-TETRAFLUOROETH EX AERO: 1.0000 "application " | INHALATION_SPRAY | CUTANEOUS | Status: DC | PRN

## 2020-12-10 MED ORDER — SODIUM CHLORIDE 0.9 % IV SOLN
2.0000 g | INTRAVENOUS | Status: DC
  Administered 2020-12-10: 02:00:00 2 g via INTRAVENOUS
  Filled 2020-12-10 (×2): qty 20

## 2020-12-10 MED ORDER — SODIUM CHLORIDE 0.9% FLUSH: 3.0000 mL | INTRAVENOUS | Status: DC | PRN

## 2020-12-10 MED ORDER — LACTULOSE 10 GM/15ML PO SOLN
10.0000 g | Freq: Three times a day (TID) | ORAL | Status: DC
  Administered 2020-12-10 (×2): 10 g via ORAL
  Filled 2020-12-10 (×2): qty 15

## 2020-12-10 MED ORDER — SODIUM CHLORIDE 0.9 % IV SOLN: 250.0000 mL | INTRAVENOUS | Status: DC | PRN

## 2020-12-10 MED ORDER — LIDOCAINE HCL (PF) 1 % IJ SOLN: 5.0000 mL | INTRAMUSCULAR | Status: DC | PRN

## 2020-12-10 MED ORDER — RENA-VITE PO TABS
1.0000 | ORAL_TABLET | Freq: Every day | ORAL | Status: DC
  Administered 2020-12-10: 1 via ORAL
  Filled 2020-12-10: qty 1

## 2020-12-10 MED ORDER — SODIUM CHLORIDE 0.9 % IV SOLN
500.0000 mg | INTRAVENOUS | Status: DC
  Filled 2020-12-10 (×2): qty 500

## 2020-12-10 MED ORDER — LABETALOL HCL 100 MG PO TABS
100.0000 mg | ORAL_TABLET | Freq: Two times a day (BID) | ORAL | Status: DC
  Administered 2020-12-10 – 2020-12-11 (×2): 100 mg via ORAL
  Filled 2020-12-10 (×2): qty 1

## 2020-12-10 MED ORDER — ALBUTEROL SULFATE (2.5 MG/3ML) 0.083% IN NEBU: 2.5000 mg | INHALATION_SOLUTION | RESPIRATORY_TRACT | Status: DC | PRN

## 2020-12-10 MED ORDER — VANCOMYCIN HCL 2000 MG/400ML IV SOLN
2000.0000 mg | Freq: Once | INTRAVENOUS | Status: AC
  Administered 2020-12-10: 2000 mg via INTRAVENOUS
  Filled 2020-12-10: qty 400

## 2020-12-10 MED ORDER — CHLORHEXIDINE GLUCONATE CLOTH 2 % EX PADS: 6.0000 | MEDICATED_PAD | Freq: Every day | CUTANEOUS | Status: DC

## 2020-12-10 MED ORDER — PROSOURCE PLUS PO LIQD
30.0000 mL | Freq: Three times a day (TID) | ORAL | Status: DC
  Administered 2020-12-10 (×4): 30 mL via ORAL
  Filled 2020-12-10 (×6): qty 30

## 2020-12-10 MED ORDER — AMLODIPINE BESYLATE 10 MG PO TABS
10.0000 mg | ORAL_TABLET | Freq: Every day | ORAL | Status: DC
  Administered 2020-12-10 – 2020-12-11 (×2): 10 mg via ORAL
  Filled 2020-12-10 (×3): qty 1

## 2020-12-10 MED ORDER — ACETAMINOPHEN 325 MG PO TABS: 650.0000 mg | ORAL_TABLET | Freq: Four times a day (QID) | ORAL | Status: DC | PRN

## 2020-12-10 MED ORDER — SODIUM CHLORIDE 0.9% FLUSH
3.0000 mL | Freq: Two times a day (BID) | INTRAVENOUS | Status: DC
  Administered 2020-12-10 – 2020-12-11 (×3): 3 mL via INTRAVENOUS

## 2020-12-10 MED ORDER — ALTEPLASE 2 MG IJ SOLR: 2.0000 mg | Freq: Once | INTRAMUSCULAR | Status: DC | PRN

## 2020-12-10 MED ORDER — HEPARIN SODIUM (PORCINE) 5000 UNIT/ML IJ SOLN
5000.0000 [IU] | Freq: Three times a day (TID) | INTRAMUSCULAR | Status: DC
  Administered 2020-12-10 (×2): 5000 [IU] via SUBCUTANEOUS
  Filled 2020-12-10 (×3): qty 1

## 2020-12-10 MED ORDER — VANCOMYCIN HCL IN DEXTROSE 1-5 GM/200ML-% IV SOLN: 1000.0000 mg | INTRAVENOUS | Status: DC

## 2020-12-10 MED ORDER — VANCOMYCIN HCL 2000 MG/400ML IV SOLN
2000.0000 mg | Freq: Once | INTRAVENOUS | Status: DC
  Filled 2020-12-10: qty 400

## 2020-12-10 MED ORDER — HEPARIN SODIUM (PORCINE) 1000 UNIT/ML DIALYSIS: 1000.0000 [IU] | INTRAMUSCULAR | Status: DC | PRN

## 2020-12-10 MED ORDER — INSULIN ASPART 100 UNIT/ML ~~LOC~~ SOLN
0.0000 [IU] | SUBCUTANEOUS | Status: DC
  Administered 2020-12-10: 3 [IU] via SUBCUTANEOUS
  Administered 2020-12-10: 02:00:00 1 [IU] via SUBCUTANEOUS
  Administered 2020-12-10: 2 [IU] via SUBCUTANEOUS
  Administered 2020-12-11: 1 [IU] via SUBCUTANEOUS

## 2020-12-10 MED ORDER — LANTHANUM CARBONATE 500 MG PO CHEW
1000.0000 mg | CHEWABLE_TABLET | Freq: Two times a day (BID) | ORAL | Status: DC
  Administered 2020-12-10 (×3): 1000 mg via ORAL
  Filled 2020-12-10 (×5): qty 2

## 2020-12-10 NOTE — Progress Notes (Signed)
Pt was transported to Dialysis on BP. Pt stable throughout transport. RT will continue to monitor.

## 2020-12-10 NOTE — Procedures (Signed)
I was present at this dialysis session. I have reviewed the session itself and made appropriate changes.   Vital signs in last 24 hours:  Temp:  [100.7 F (38.2 C)] 100.7 F (38.2 C) (12/20 2126) Pulse Rate:  [67-90] 78 (12/21 0730) Resp:  [20-39] 22 (12/21 0730) BP: (153-229)/(87-161) 188/96 (12/21 0730) SpO2:  [97 %-100 %] 98 % (12/21 0715) FiO2 (%):  [40 %] 40 % (12/21 0053) Weight:  [126 kg] 126 kg (12/20 2129) Weight change:  Filed Weights   12/09/20 2129  Weight: 126 kg    Recent Labs  Lab 12/09/20 2129 12/09/20 2300  NA 138 138  K 6.3* 6.8*  CL 100  --   CO2 23  --   GLUCOSE 146*  --   BUN 68*  --   CREATININE 12.46*  --   CALCIUM 9.5  --     Recent Labs  Lab 12/09/20 2129 12/09/20 2300  WBC 19.8*  --   NEUTROABS 16.2*  --   HGB 11.2* 13.9  HCT 36.2 41.0  MCV 94.5  --   PLT 276  --     Scheduled Meds: . amLODipine  10 mg Oral Daily  . Chlorhexidine Gluconate Cloth  6 each Topical Q0600  . heparin  5,000 Units Subcutaneous Q8H  . insulin aspart  0-6 Units Subcutaneous Q4H  . [START ON 12/11/2020] labetalol  100 mg Oral BID  . lactulose  10 g Oral TID  . lanthanum  1,000 mg Oral BID WC  . sodium chloride flush  3 mL Intravenous Q12H   Continuous Infusions: . sodium chloride    . sodium chloride    . sodium chloride    . azithromycin    . cefTRIAXone (ROCEPHIN)  IV 2 g (12/10/20 0155)  . vancomycin    . vancomycin     PRN Meds:.sodium chloride, sodium chloride, sodium chloride, acetaminophen **OR** acetaminophen, albuterol, alteplase, heparin, HYDROcodone-acetaminophen, lidocaine (PF), lidocaine-prilocaine, pentafluoroprop-tetrafluoroeth, sodium chloride flush   Donetta Potts,  MD 12/10/2020, 8:23 AM

## 2020-12-10 NOTE — Progress Notes (Signed)
PROGRESS NOTE    Pamela Campbell   ZOX:[id number]  DOB: 07/21/1961  DOA: 12/09/2020     1  PCP: [facility]  CC: SOB  Hospital Course: Pamela Campbell is a 59 yo female with PMH ESRD on HD TThSa who presented to the ER with shortness of breath and hypoxia. She typically endorses feeling more short of breath and volume overloaded on Mondays due to going 3 days until her next dialysis session on Tuesdays from a Saturday. In the ER she was found to be hypoxic in the 70s and was placed on CPAP. She underwent dialysis on the morning of 12/10/2020 and felt tremendously better after dialysis session. CXR on admission was also consistent with volume overload. She was on oxygen after dialysis which was slowly weaned off.  Interval History:  Seen in her room after dialysis session. Breathing and feeling much better overall. Was about to order lunch.  Old records reviewed in assessment of this patient  ROS: Constitutional: negative for chills and fevers, Respiratory: positive for SOB, Cardiovascular: negative for chest pain and Gastrointestinal: negative for abdominal pain  Assessment & Plan: Acute respiratory failure with hypoxia   -most likely secondary to fluid overload in the setting of end-stage renal disease.  - s/p HD on 12/21 with dramatic improvement. Wean oxygen to off as able  SIRS - 2/2 volume overload; no suspicion of infection at this time - PCT 0.4 - s/p abx on admission; d/c now and monitor off - repeat PCT in am  Hypertensive urgency -Pressure responded well to dialysis and treatment  Hyperlipidemia -Continue home meds  Elevated troponin -No concern for ACS. Troponin elevation likely in setting of underlying end-stage renal disease  Type 2 diabetes -Continue SSI and CBG monitoring  Acute on chronic diastolic CHF -Volume overload likely in setting of underlying ESRD -Echo obtained, EF 55 to 81%, grade 1 diastolic dysfunction -Continue  ongoing volume removal with dialysis    Antimicrobials:   DVT prophylaxis: HSQ Code Status: FULL Family Communication: Daughter bedside Disposition Plan: Status is: Inpatient  Remains inpatient appropriate because:IV treatments appropriate due to intensity of illness or inability to take PO and Inpatient level of care appropriate due to severity of illness   Dispo: The patient is from: Home              Anticipated d/c is to: Home              Anticipated d/c date is: 1 day              Patient currently is not medically stable to d/c.       Objective: Blood pressure 138/62, pulse 78, temperature 98.2 F (36.8 C), resp. rate 20, height 5\' 4"  (1.626 m), weight 126 kg, last menstrual period 02/02/2015, SpO2 99 %.  Examination: General appearance: Pleasant adult woman resting in bed appearing much more comfortable and in no distress Head: Normocephalic, without obvious abnormality, atraumatic Eyes: EOMI Lungs: Scattered crackles bilaterally Heart: regular rate and rhythm and S1, S2 normal Abdomen: normal findings: bowel sounds normal and soft, non-tender Extremities: Trace lower extremity edema Skin: mobility and turgor normal Neurologic: Grossly normal  Consultants:   Nephrology  Procedures:     Data Reviewed: I have personally reviewed following labs and imaging studies Results for orders placed or performed during the hospital encounter of 12/09/20 (from the past 24 hour(s))  CBC with Differential     Status: Abnormal   Collection Time:  12/09/20  9:29 PM  Result Value Ref Range   WBC 19.8 (H) 4.0 - 10.5 K/uL   RBC 3.83 (L) 3.87 - 5.11 MIL/uL   Hemoglobin 11.2 (L) 12.0 - 15.0 g/dL   HCT 36.2 36.0 - 46.0 %   MCV 94.5 80.0 - 100.0 fL   MCH 29.2 26.0 - 34.0 pg   MCHC 30.9 30.0 - 36.0 g/dL   RDW 14.6 11.5 - 15.5 %   Platelets 276 150 - 400 K/uL   nRBC 0.0 0.0 - 0.2 %   Neutrophils Relative % 81 %   Neutro Abs 16.2 (H) 1.7 - 7.7 K/uL   Lymphocytes Relative  11 %   Lymphs Abs 2.2 0.7 - 4.0 K/uL   Monocytes Relative 6 %   Monocytes Absolute 1.2 (H) 0.1 - 1.0 K/uL   Eosinophils Relative 1 %   Eosinophils Absolute 0.1 0.0 - 0.5 K/uL   Basophils Relative 0 %   Basophils Absolute 0.1 0.0 - 0.1 K/uL   Immature Granulocytes 1 %   Abs Immature Granulocytes 0.10 (H) 0.00 - 0.07 K/uL  Comprehensive metabolic panel     Status: Abnormal   Collection Time: 12/09/20  9:29 PM  Result Value Ref Range   Sodium 138 135 - 145 mmol/L   Potassium 6.3 (HH) 3.5 - 5.1 mmol/L   Chloride 100 98 - 111 mmol/L   CO2 23 22 - 32 mmol/L   Glucose, Bld 146 (H) 70 - 99 mg/dL   BUN 68 (H) 6 - 20 mg/dL   Creatinine, Ser 12.46 (H) 0.44 - 1.00 mg/dL   Calcium 9.5 8.9 - 10.3 mg/dL   Total Protein 7.3 6.5 - 8.1 g/dL   Albumin 3.5 3.5 - 5.0 g/dL   AST 37 15 - 41 U/L   ALT 38 0 - 44 U/L   Alkaline Phosphatase 128 (H) 38 - 126 U/L   Total Bilirubin 0.5 0.3 - 1.2 mg/dL   GFR, Estimated 3 (L) >60 mL/min   Anion gap 15 5 - 15  Protime-INR     Status: None   Collection Time: 12/09/20  9:29 PM  Result Value Ref Range   Prothrombin Time 13.7 11.4 - 15.2 seconds   INR 1.1 0.8 - 1.2  Troponin I (High Sensitivity)     Status: Abnormal   Collection Time: 12/09/20  9:29 PM  Result Value Ref Range   Troponin I (High Sensitivity) 57 (H) <18 ng/L  Brain natriuretic peptide     Status: Abnormal   Collection Time: 12/09/20  9:29 PM  Result Value Ref Range   B Natriuretic Peptide 855.0 (H) 0.0 - 100.0 pg/mL  Lactic acid, plasma     Status: None   Collection Time: 12/09/20  9:29 PM  Result Value Ref Range   Lactic Acid, Venous 1.4 0.5 - 1.9 mmol/L  Resp Panel by RT-PCR (Flu A&B, Covid) Nasopharyngeal Swab     Status: None   Collection Time: 12/09/20  9:31 PM   Specimen: Nasopharyngeal Swab; Nasopharyngeal(NP) swabs in vial transport medium  Result Value Ref Range   SARS Coronavirus 2 by RT PCR NEGATIVE NEGATIVE   Influenza A by PCR NEGATIVE NEGATIVE   Influenza B by PCR NEGATIVE  NEGATIVE  Culture, blood (routine x 2)     Status: None (Preliminary result)   Collection Time: 12/09/20 10:49 PM   Specimen: BLOOD LEFT HAND  Result Value Ref Range   Specimen Description BLOOD LEFT HAND    Special Requests  BOTTLES DRAWN AEROBIC AND ANAEROBIC Blood Culture adequate volume   Culture      NO GROWTH < 12 HOURS Performed at [facility] [address], [place], [place] [address]    Report Status PENDING   I-Stat venous blood gas, [facility] ED only)     Status: Abnormal   Collection Time: 12/09/20 11:00 PM  Result Value Ref Range   pH, Ven 7.371 7.250 - 7.430   pCO2, Ven 44.0 44.0 - 60.0 mmHg   pO2, Ven 83.0 (H) 32.0 - 45.0 mmHg   Bicarbonate 25.5 20.0 - 28.0 mmol/L   TCO2 27 22 - 32 mmol/L   O2 Saturation 96.0 %   Acid-Base Excess 0.0 0.0 - 2.0 mmol/L   Sodium 138 135 - 145 mmol/L   Potassium 6.8 (HH) 3.5 - 5.1 mmol/L   Calcium, Ion 1.14 (L) 1.15 - 1.40 mmol/L   HCT 41.0 36.0 - 46.0 %   Hemoglobin 13.9 12.0 - 15.0 g/dL   Sample type VENOUS    Comment NOTIFIED PHYSICIAN   CBG monitoring, ED     Status: Abnormal   Collection Time: 12/09/20 11:22 PM  Result Value Ref Range   Glucose-Capillary 134 (H) 70 - 99 mg/dL  Lactic acid, plasma     Status: None   Collection Time: 12/09/20 11:29 PM  Result Value Ref Range   Lactic Acid, Venous 1.0 0.5 - 1.9 mmol/L  Troponin I (High Sensitivity)     Status: Abnormal   Collection Time: 12/09/20 11:29 PM  Result Value Ref Range   Troponin I (High Sensitivity) 70 (H) <18 ng/L  CBG monitoring, ED     Status: Abnormal   Collection Time: 12/10/20 12:21 AM  Result Value Ref Range   Glucose-Capillary 165 (H) 70 - 99 mg/dL  CBG monitoring, ED     Status: Abnormal   Collection Time: 12/10/20  1:46 AM  Result Value Ref Range   Glucose-Capillary 171 (H) 70 - 99 mg/dL  Hemoglobin A1c     Status: Abnormal   Collection Time: 12/10/20  1:48 AM  Result Value Ref Range   Hgb A1c MFr Bld 6.8 (H) 4.8 - 5.6 %   Mean Plasma  Glucose 148.46 mg/dL  Glucose, capillary     Status: Abnormal   Collection Time: 12/10/20  9:45 AM  Result Value Ref Range   Glucose-Capillary 146 (H) 70 - 99 mg/dL  HIV Antibody (routine testing w rflx)     Status: None   Collection Time: 12/10/20 10:01 AM  Result Value Ref Range   HIV Screen 4th Generation wRfx Non Reactive Non Reactive  Magnesium     Status: Abnormal   Collection Time: 12/10/20 10:01 AM  Result Value Ref Range   Magnesium 2.9 (H) 1.7 - 2.4 mg/dL  Phosphorus     Status: Abnormal   Collection Time: 12/10/20 10:01 AM  Result Value Ref Range   Phosphorus 5.8 (H) 2.5 - 4.6 mg/dL  CBC WITH DIFFERENTIAL     Status: Abnormal   Collection Time: 12/10/20 10:01 AM  Result Value Ref Range   WBC 12.6 (H) 4.0 - 10.5 K/uL   RBC 3.56 (L) 3.87 - 5.11 MIL/uL   Hemoglobin 10.5 (L) 12.0 - 15.0 g/dL   HCT 33.5 (L) 36.0 - 46.0 %   MCV 94.1 80.0 - 100.0 fL   MCH 29.5 26.0 - 34.0 pg   MCHC 31.3 30.0 - 36.0 g/dL   RDW 14.6 11.5 - 15.5 %   Platelets  246 150 - 400 K/uL   nRBC 0.0 0.0 - 0.2 %   Neutrophils Relative % 90 %   Neutro Abs 11.4 (H) 1.7 - 7.7 K/uL   Lymphocytes Relative 8 %   Lymphs Abs 1.0 0.7 - 4.0 K/uL   Monocytes Relative 1 %   Monocytes Absolute 0.1 0.1 - 1.0 K/uL   Eosinophils Relative 0 %   Eosinophils Absolute 0.0 0.0 - 0.5 K/uL   Basophils Relative 0 %   Basophils Absolute 0.0 0.0 - 0.1 K/uL   Immature Granulocytes 1 %   Abs Immature Granulocytes 0.07 0.00 - 0.07 K/uL  Comprehensive metabolic panel     Status: Abnormal   Collection Time: 12/10/20 10:01 AM  Result Value Ref Range   Sodium 141 135 - 145 mmol/L   Potassium 7.4 (HH) 3.5 - 5.1 mmol/L   Chloride 102 98 - 111 mmol/L   CO2 22 22 - 32 mmol/L   Glucose, Bld 175 (H) 70 - 99 mg/dL   BUN 71 (H) 6 - 20 mg/dL   Creatinine, Ser 12.78 (H) 0.44 - 1.00 mg/dL   Calcium 9.5 8.9 - 10.3 mg/dL   Total Protein 7.7 6.5 - 8.1 g/dL   Albumin 3.5 3.5 - 5.0 g/dL   AST 26 15 - 41 U/L   ALT 34 0 - 44 U/L    Alkaline Phosphatase 118 38 - 126 U/L   Total Bilirubin 0.6 0.3 - 1.2 mg/dL   GFR, Estimated 3 (L) >60 mL/min   Anion gap 17 (H) 5 - 15  Procalcitonin     Status: None   Collection Time: 12/10/20 10:01 AM  Result Value Ref Range   Procalcitonin 0.40 ng/mL  TSH     Status: None   Collection Time: 12/10/20 10:04 AM  Result Value Ref Range   TSH 1.331 0.350 - 4.500 uIU/mL  Hepatitis B surface antigen     Status: None   Collection Time: 12/10/20 10:04 AM  Result Value Ref Range   Hepatitis B Surface Ag NON REACTIVE NON REACTIVE  MRSA PCR Screening     Status: None   Collection Time: 12/10/20 11:55 AM   Specimen: Nasal Mucosa; Nasopharyngeal  Result Value Ref Range   MRSA by PCR NEGATIVE NEGATIVE  Renal function panel     Status: Abnormal   Collection Time: 12/10/20 12:20 PM  Result Value Ref Range   Sodium 139 135 - 145 mmol/L   Potassium 5.2 (H) 3.5 - 5.1 mmol/L   Chloride 98 98 - 111 mmol/L   CO2 22 22 - 32 mmol/L   Glucose, Bld 157 (H) 70 - 99 mg/dL   BUN 34 (H) 6 - 20 mg/dL   Creatinine, Ser 7.56 (H) 0.44 - 1.00 mg/dL   Calcium 9.1 8.9 - 10.3 mg/dL   Phosphorus 6.8 (H) 2.5 - 4.6 mg/dL   Albumin 3.5 3.5 - 5.0 g/dL   GFR, Estimated 6 (L) >60 mL/min   Anion gap 19 (H) 5 - 15  Protime-INR     Status: None   Collection Time: 12/10/20 12:20 PM  Result Value Ref Range   Prothrombin Time 14.5 11.4 - 15.2 seconds   INR 1.2 0.8 - 1.2  Glucose, capillary     Status: Abnormal   Collection Time: 12/10/20 12:58 PM  Result Value Ref Range   Glucose-Capillary 192 (H) 70 - 99 mg/dL  Glucose, capillary     Status: Abnormal   Collection Time: 12/10/20  4:31 PM  Result Value  Ref Range   Glucose-Capillary 262 (H) 70 - 99 mg/dL    Recent Results (from the past 240 hour(s))  Resp Panel by RT-PCR (Flu A&B, Covid) Nasopharyngeal Swab     Status: None   Collection Time: 12/09/20  9:31 PM   Specimen: Nasopharyngeal Swab; Nasopharyngeal(NP) swabs in vial transport medium  Result Value Ref  Range Status   SARS Coronavirus 2 by RT PCR NEGATIVE NEGATIVE Final    Comment: (NOTE) SARS-CoV-2 target nucleic acids are NOT DETECTED.  The SARS-CoV-2 RNA is generally detectable in upper respiratory specimens during the acute phase of infection. The lowest concentration of SARS-CoV-2 viral copies this assay can detect is 138 copies/mL. A negative result does not preclude SARS-Cov-2 infection and should not be used as the sole basis for treatment or other patient management decisions. A negative result may occur with  improper specimen collection/handling, submission of specimen other than nasopharyngeal swab, presence of viral mutation(s) within the areas targeted by this assay, and inadequate number of viral copies(<138 copies/mL). A negative result must be combined with clinical observations, patient history, and epidemiological information. The expected result is Negative.  Fact Sheet for Patients:  EntrepreneurPulse.com.au  Fact Sheet for Healthcare Providers:  IncredibleEmployment.be  This test is no t yet approved or cleared by the [place] FDA and  has been authorized for detection and/or diagnosis of SARS-CoV-2 by FDA under an Emergency Use Authorization (EUA). This EUA will remain  in effect (meaning this test can be used) for the duration of the COVID-19 declaration under Section 564(b)(1) of the Act, 21 U.S.C.section 360bbb-3(b)(1), unless the authorization is terminated  or revoked sooner.       Influenza A by PCR NEGATIVE NEGATIVE Final   Influenza B by PCR NEGATIVE NEGATIVE Final    Comment: (NOTE) The Xpert Xpress SARS-CoV-2/FLU/RSV plus assay is intended as an aid in the diagnosis of influenza from Nasopharyngeal swab specimens and should not be used as a sole basis for treatment. Nasal washings and aspirates are unacceptable for Xpert Xpress SARS-CoV-2/FLU/RSV testing.  Fact Sheet for  Patients: EntrepreneurPulse.com.au  Fact Sheet for Healthcare Providers: IncredibleEmployment.be  This test is not yet approved or cleared by the [place] FDA and has been authorized for detection and/or diagnosis of SARS-CoV-2 by FDA under an Emergency Use Authorization (EUA). This EUA will remain in effect (meaning this test can be used) for the duration of the COVID-19 declaration under Section 564(b)(1) of the Act, 21 U.S.C. section 360bbb-3(b)(1), unless the authorization is terminated or revoked.  Performed at [facility], [place] [address], [place], [place] [address]   Culture, blood (routine x 2)     Status: None (Preliminary result)   Collection Time: 12/09/20 10:49 PM   Specimen: BLOOD LEFT HAND  Result Value Ref Range Status   Specimen Description BLOOD LEFT HAND  Final   Special Requests   Final    BOTTLES DRAWN AEROBIC AND ANAEROBIC Blood Culture adequate volume   Culture   Final    NO GROWTH < 12 HOURS Performed at [facility], [place] [address] [place], [place] [address]    Report Status PENDING  Incomplete  MRSA PCR Screening     Status: None   Collection Time: 12/10/20 11:55 AM   Specimen: Nasal Mucosa; Nasopharyngeal  Result Value Ref Range Status   MRSA by PCR NEGATIVE NEGATIVE Final    Comment:        The GeneXpert MRSA Assay (FDA approved for NASAL  specimens only), is one component of a comprehensive MRSA colonization surveillance program. It is not intended to diagnose MRSA infection nor to guide or monitor treatment for MRSA infections. Performed at [facility], [place] [address], [place], [place] [address]      Radiology Studies: DG Chest 1 View  Result Date: 12/09/2020 CLINICAL DATA:  Shortness of breath EXAM: CHEST  1 VIEW COMPARISON:  11/05/2020 FINDINGS: Single frontal view of the chest demonstrates an enlarged cardiac silhouette. There is increased central vascular prominence with  interval development of bilateral interstitial opacities and trace effusions. No pneumothorax. IMPRESSION: 1. Worsening volume status, with interval development of interstitial edema and mild CHF. Electronically Signed   By: Randa Ngo M.D.   On: 12/09/2020 21:39   DG Chest 2 View  Result Date: 12/10/2020 CLINICAL DATA:  Leukocytosis.  Shortness of breath. EXAM: CHEST - 2 VIEW COMPARISON:  12/09/2020. FINDINGS: Cardiomegaly. Pulmonary venous congestion. Bilateral interstitial prominence. Findings consistent with interstitial edema and or pneumonitis. Small bilateral pleural effusions. No pneumothorax. IMPRESSION: 1. Cardiomegaly with pulmonary venous congestion. 2. Bilateral interstitial prominence consistent with interstitial edema and or pneumonitis. Small bilateral pleural effusions. Similar findings noted on prior exam. Electronically Signed   By: Marcello Moores  Register   On: 12/10/2020 12:10   ECHOCARDIOGRAM COMPLETE  Result Date: 12/10/2020    ECHOCARDIOGRAM REPORT   Patient Name:   Pamela MOCARSKI Date of Exam: 12/10/2020 Medical Rec #:  [id number]      Height:       64.0 in Accession #:    [id number]     Weight:       277.8 lb Date of Birth:  [birth year]      BSA:          2.250 m Patient Age:    16 years       BP:           138/62 mmHg Patient Gender: F              HR:           83 bpm. Exam Location:  Inpatient Procedure: 2D Echo, Cardiac Doppler and Color Doppler Indications:    CHF-Acute Diastolic V42.59  History:        Patient has prior history of Echocardiogram examinations, most                 recent 12/31/2017. CHF, Signs/Symptoms:Dyspnea; Risk                 Factors:Hypertension, Diabetes and Former Smoker.  Sonographer:    Vickie Epley RDCS Referring Phys: [id number]  1. Left ventricular ejection fraction, by estimation, is 55 to 60%. The left ventricle has normal function. The left ventricle has no regional wall motion abnormalities. There is mild left ventricular  hypertrophy. Left ventricular diastolic parameters are consistent with Grade I diastolic dysfunction (impaired relaxation). Elevated left ventricular end-diastolic pressure.  2. Right ventricular systolic function is normal. The right ventricular size is normal. There is normal pulmonary artery systolic pressure. The estimated right ventricular systolic pressure is 75.6 mmHg.  3. The mitral valve is abnormal. Leaflet thickening is noted. Trivial mitral valve regurgitation.  4. The aortic valve is tricuspid. Aortic valve regurgitation is not visualized.  5. The inferior vena cava is normal in size with greater than 50% respiratory variability, suggesting right atrial pressure of 3 mmHg. Comparison(s): Changes from prior study are noted. 12/31/17: LVEF 60-65%, grade 1 DD. FINDINGS  Left Ventricle: Left ventricular ejection fraction, by estimation, is 55 to 60%. The left ventricle has normal function. The left ventricle has no regional wall motion abnormalities. The left ventricular internal cavity size was normal in size. There is  mild left ventricular hypertrophy. Left ventricular diastolic parameters are consistent with Grade I diastolic dysfunction (impaired relaxation). Elevated left ventricular end-diastolic pressure. Right Ventricle: The right ventricular size is normal. No increase in right ventricular wall thickness. Right ventricular systolic function is normal. There is normal pulmonary artery systolic pressure. The tricuspid regurgitant velocity is 2.74 m/s, and  with an assumed right atrial pressure of 3 mmHg, the estimated right ventricular systolic pressure is 16.0 mmHg. Left Atrium: Left atrial size was normal in size. Right Atrium: Right atrial size was normal in size. Pericardium: There is no evidence of pericardial effusion. Mitral Valve: The mitral valve is abnormal. There is mild thickening of the mitral valve leaflet(s). Trivial mitral valve regurgitation. Tricuspid Valve: The tricuspid valve is  grossly normal. Tricuspid valve regurgitation is trivial. Aortic Valve: The aortic valve is tricuspid. Aortic valve regurgitation is not visualized. Pulmonic Valve: The pulmonic valve was normal in structure. Pulmonic valve regurgitation is not visualized. Aorta: The aortic root and ascending aorta are structurally normal, with no evidence of dilitation. Venous: The inferior vena cava is normal in size with greater than 50% respiratory variability, suggesting right atrial pressure of 3 mmHg. IAS/Shunts: No atrial level shunt detected by color flow Doppler.  LEFT VENTRICLE PLAX 2D LVIDd:         4.20 cm      Diastology LVIDs:         3.10 cm      LV e' medial:    5.92 cm/s LV PW:         0.90 cm      LV E/e' medial:  18.2 LV IVS:        0.90 cm      LV e' lateral:   6.24 cm/s LVOT diam:     2.10 cm      LV E/e' lateral: 17.3 LV SV:         112 LV SV Index:   50 LVOT Area:     3.46 cm  LV Volumes (MOD) LV vol d, MOD A2C: 115.0 ml LV vol d, MOD A4C: 121.0 ml LV vol s, MOD A2C: 43.2 ml LV vol s, MOD A4C: 55.7 ml LV SV MOD A2C:     71.8 ml LV SV MOD A4C:     121.0 ml LV SV MOD BP:      69.8 ml RIGHT VENTRICLE RV S prime:     16.80 cm/s TAPSE (M-mode): 2.8 cm LEFT ATRIUM             Index       RIGHT ATRIUM          Index LA diam:        3.70 cm 1.64 cm/m  RA Area:     9.65 cm LA Vol (A2C):   36.7 ml 16.31 ml/m RA Volume:   18.20 ml 8.09 ml/m LA Vol (A4C):   41.1 ml 18.27 ml/m LA Biplane Vol: 41.4 ml 18.40 ml/m  AORTIC VALVE LVOT Vmax:   155.00 cm/s LVOT Vmean:  108.000 cm/s LVOT VTI:    0.323 m  AORTA Ao Root diam: 2.90 cm MITRAL VALVE                TRICUSPID VALVE MV Area (  PHT): 3.37 cm     TR Peak grad:   30.0 mmHg MV Decel Time: 225 msec     TR Vmax:        274.00 cm/s MV E velocity: 108.00 cm/s MV A velocity: 98.00 cm/s   SHUNTS MV E/A ratio:  1.10         Systemic VTI:  0.32 m                             Systemic Diam: 2.10 cm Lyman Bishop MD Electronically signed by Lyman Bishop MD Signature Date/Time:  12/10/2020/5:16:30 PM    Final    DG Chest 2 View  Final Result    DG Chest 1 View  Final Result      Scheduled Meds:  (feeding supplement) PROSource Plus  30 mL Oral TID BM   amLODipine  10 mg Oral Daily   Chlorhexidine Gluconate Cloth  6 each Topical Q0600   heparin  5,000 Units Subcutaneous Q8H   insulin aspart  0-6 Units Subcutaneous Q4H   [START ON 12/11/2020] labetalol  100 mg Oral BID   lactulose  10 g Oral TID   lanthanum  1,000 mg Oral BID WC   multivitamin  1 tablet Oral QHS   sodium chloride flush  3 mL Intravenous Q12H   PRN Meds: sodium chloride, acetaminophen **OR** acetaminophen, albuterol, HYDROcodone-acetaminophen, sodium chloride flush Continuous Infusions:  sodium chloride     azithromycin     cefTRIAXone (ROCEPHIN)  IV 2 g (12/10/20 0155)   [START ON 12/12/2020] vancomycin       LOS: 1 day  Time spent: Greater than 50% of the 35 minute visit was spent in counseling/coordination of care for the patient as laid out in the A&P.   Dwyane Dee, MD [facility] 12/10/2020, 6:38 PM

## 2020-12-10 NOTE — Plan of Care (Signed)
Breathing has improved since dialysis. Patient is now on RA.Sats 93-94% ar rest. Sats drop when ambulating to 85-89%. Encouraged use of ISP.

## 2020-12-10 NOTE — Evaluation (Signed)
Physical Therapy Evaluation Patient Details Name: Pamela Campbell MRN: [id number] DOB: Jan 29, 1961 Today's Date: 12/10/2020   History of Present Illness  Pamela Campbell is a 59 y.o. female with medical history significant of Obesity, ESRD on Tuesday Thursday and Saturday, HFpEF, DM HTN, who presents for hypoxia  and hyperkalemia.  Clinical Impression  Prior to admission, pt lives with her son in a house with 8 steps to enter and is a limited Hydrographic surveyor. Pt drives self to dialysis. On PT evaluation, pt presents with decreased cardiopulmonary endurance. Pt ambulating 100 feet with no assistive device at a supervision level. SpO2 93-94% on RA at rest, desaturation to 85-89% with ambulation. Cues for activity pacing and pursed lip breathing. IS provided and pt pulling ~750 ml. Encouraged increased mobilization out of bed to chair. Will continue to follow acutely to progress mobility as tolerated.     Follow Up Recommendations Outpatient PT (pt preference)    Equipment Recommendations  None recommended by PT    Recommendations for Other Services       Precautions / Restrictions Precautions Precautions: Other (comment) Precaution Comments: watch O2 Restrictions Weight Bearing Restrictions: No      Mobility  Bed Mobility Overal bed mobility: Modified Independent             General bed mobility comments: Increased time, HOB elevated    Transfers Overall transfer level: Independent Equipment used: None                Ambulation/Gait Ambulation/Gait assistance: Supervision Gait Distance (Feet): 100 Feet Assistive device: None Gait Pattern/deviations: Step-through pattern;Decreased stride length Gait velocity: decreased   General Gait Details: No gross instability, cues for activity pacing and pursed lip breathing  Stairs            Wheelchair Mobility    Modified Rankin (Stroke Patients Only)       Balance Overall balance assessment: Mild  deficits observed, not formally tested                                           Pertinent Vitals/Pain Pain Assessment: No/denies pain    Home Living Family/patient expects to be discharged to:: Private residence Living Arrangements: Children (son) Available Help at Discharge: Family Type of Home: House Home Access: Stairs to enter Entrance Stairs-Rails:  (has rails, but they are deteriorated) Entrance Stairs-Number of Steps: 8 Home Layout: One level Home Equipment: Clinical cytogeneticist - 4 wheels      Prior Function Level of Independence: Needs assistance   Gait / Transfers Assistance Needed: No AD. Pt son assists with stair negotiation.  ADL's / Homemaking Assistance Needed: Independent        Hand Dominance        Extremity/Trunk Assessment   Upper Extremity Assessment Upper Extremity Assessment: Overall WFL for tasks assessed    Lower Extremity Assessment Lower Extremity Assessment: Overall WFL for tasks assessed       Communication   Communication: No difficulties  Cognition Arousal/Alertness: Awake/alert Behavior During Therapy: WFL for tasks assessed/performed Overall Cognitive Status: Within Functional Limits for tasks assessed                                        General Comments      Exercises Other Exercises  Other Exercises: IS use x 10   Assessment/Plan    PT Assessment Patient needs continued PT services  PT Problem List Decreased strength;Decreased activity tolerance;Decreased balance;Decreased mobility;Obesity;Cardiopulmonary status limiting activity       PT Treatment Interventions Gait training;Stair training;Therapeutic exercise;Functional mobility training;Therapeutic activities;Balance training;Patient/family education    PT Goals (Current goals can be found in the Care Plan section)  Acute Rehab PT Goals Patient Stated Goal: return to work, tolerate more activity PT Goal Formulation: With  patient Time For Goal Achievement: 12/24/20 Potential to Achieve Goals: Good    Frequency Min 3X/week   Barriers to discharge        Co-evaluation               AM-PAC PT "6 Clicks" Mobility  Outcome Measure Help needed turning from your back to your side while in a flat bed without using bedrails?: None Help needed moving from lying on your back to sitting on the side of a flat bed without using bedrails?: None Help needed moving to and from a bed to a chair (including a wheelchair)?: None Help needed standing up from a chair using your arms (e.g., wheelchair or bedside chair)?: None Help needed to walk in hospital room?: None Help needed climbing 3-5 steps with a railing? : A Little 6 Click Score: 23    End of Session   Activity Tolerance: Patient tolerated treatment well Patient left: in chair;with call bell/phone within reach Nurse Communication: Mobility status PT Visit Diagnosis: Difficulty in walking, not elsewhere classified (R26.2)    Time: 7893-8101 PT Time Calculation (min) (ACUTE ONLY): 36 min   Charges:   PT Evaluation $PT Eval Moderate Complexity: 1 Mod PT Treatments $Therapeutic Activity: 8-22 mins        Wyona Almas, PT, DPT Acute Rehabilitation Services Pager [phone] Office [phone]   Deno Etienne 12/10/2020, 5:10 PM

## 2020-12-10 NOTE — Progress Notes (Signed)
Inpatient Diabetes Program Recommendations  AACE/ADA: New Consensus Statement on Inpatient Glycemic Control (2015)  Target Ranges:  Prepandial:   less than 140 mg/dL      Peak postprandial:   less than 180 mg/dL (1-2 hours)      Critically ill patients:  140 - 180 mg/dL   Lab Results  Component Value Date   GLUCAP 146 (H) 12/10/2020   HGBA1C 6.8 (H) 12/10/2020    Review of Glycemic Control Results for Pamela Campbell, Pamela Campbell (MRN [id number]) as of 12/10/2020 11:18  Ref. Range 12/10/2020 00:21 12/10/2020 01:46 12/10/2020 09:45  Glucose-Capillary Latest Ref Range: 70 - 99 mg/dL 165 (H) 171 (H) 146 (H)   Diabetes history: Type 2 DM Outpatient Diabetes medications: none Current orders for Inpatient glycemic control: Novolog 0-6 units Q4H Solumedrol 125 mg x 1  Inpatient Diabetes Program Recommendations:    Noted consult and current glucose trends. In agreement with current plan.   Thanks, Bronson Curb, MSN, RNC-OB Diabetes Coordinator [phone] (8a-5p)

## 2020-12-10 NOTE — Progress Notes (Signed)
Pharmacy Antibiotic Note  Pamela Campbell is a 59 y.o. female admitted on 12/09/2020 with pneumonia.  Pharmacy has been consulted for Vancomycin dosing. WBC is elevated. ESRD on HD TTS.   Plan: Vancomycin 2000 mg IV x 1, then 1000 mg IV qHD TTS Ceftriaxone/Azithromycin per MD Trend WBC, temp, HD schedule F/U infectious work-up Drug levels as indicated   Height: 5\' 4"  (162.6 cm) Weight: 126 kg (277 lb 12.5 oz) IBW/kg (Calculated) : 54.7  Temp (24hrs), Avg:100.7 F (38.2 C), Min:100.7 F (38.2 C), Max:100.7 F (38.2 C)  Recent Labs  Lab 12/09/20 2129 12/09/20 2329  WBC 19.8*  --   CREATININE 12.46*  --   LATICACIDVEN 1.4 1.0    Estimated Creatinine Clearance: 6.5 mL/min (A) (by C-G formula based on SCr of 12.46 mg/dL (H)).    Allergies  Allergen Reactions  . Compazine [Prochlorperazine Edisylate] Swelling    Tongue  Hardens and comes out of mouth (Tardive dyskinesia)  . Vioxx [Rofecoxib] Other (See Comments)    Bleeding   Narda Bonds, PharmD, BCPS Clinical Pharmacist Phone: [phone]

## 2020-12-10 NOTE — Progress Notes (Signed)
  Echocardiogram 2D Echocardiogram has been performed.  Michiel Cowboy 12/10/2020, 3:22 PM

## 2020-12-10 NOTE — Progress Notes (Signed)
Pharmacy Antibiotic Note  Pamela Campbell is a 59 y.o. female admitted on 12/09/2020 with sepsis/ pneumonia.  Pharmacy has been consulted for Vancomycin dosing. Also on ceftriaxone/azithro per MD. ESRD on HD TTS - on schedule.  Vancomycin load not given prior to patient going to HD. Confirmed with RN, post-HD vancomycin dose also not yet given.   Plan: Reschedule vancomycin 2g IV load to be given now, then 1g IV qHD TTS. Discussed plan with RN Ceftriaxone 2g IV q24h / Azithro 500mg  IV q24h per MD Monitor clinical progress, c/s, abx plan/LOT Pre-HD vancomycin level as indicated F/u HD schedule/tolerance inpatient F/u MRSA PCR and ability to d/c vancomycin   Height: 5\' 4"  (162.6 cm) Weight:  (unable to assess, stretcher) IBW/kg (Calculated) : 54.7  Temp (24hrs), Avg:98.8 F (37.1 C), Min:97.6 F (36.4 C), Max:100.7 F (38.2 C)  Recent Labs  Lab 12/09/20 2129 12/09/20 2329 12/10/20 1001  WBC 19.8*  --  12.6*  CREATININE 12.46*  --   --   LATICACIDVEN 1.4 1.0  --     Estimated Creatinine Clearance: 6.5 mL/min (A) (by C-G formula based on SCr of 12.46 mg/dL (H)).    Allergies  Allergen Reactions  . Compazine [Prochlorperazine Edisylate] Swelling    Tongue  Hardens and comes out of mouth (Tardive dyskinesia)  . Vioxx [Rofecoxib] Other (See Comments)    Bleeding    Arturo Morton, PharmD, BCPS Please check AMION for all [place] contact numbers Clinical Pharmacist 12/10/2020 11:23 AM

## 2020-12-10 NOTE — Progress Notes (Signed)
Initial Nutrition Assessment  DOCUMENTATION CODES:   Morbid obesity  INTERVENTION:   Add Renal MVI  30 ml ProSource Plus TID, each supplement provides 100 kcals and 15 grams protein.   Follow-up and provide diet education as appropriate   NUTRITION DIAGNOSIS:   Increased nutrient needs related to chronic illness as evidenced by estimated needs.  GOAL:   Patient will meet greater than or equal to 90% of their needs  MONITOR:   PO intake,Supplement acceptance,Weight trends,Labs  REASON FOR ASSESSMENT:   Consult Assessment of nutrition requirement/status  ASSESSMENT:   59 yo female admitted with respiratory failure after signing off HD early with volume overload and hyperkalemia.  PMH includes ESRD on HD, morbid obesity, CHF, DM, HTN, constipation  Pt currently on BiPap, currently NPO. Receiving iHD at this time  Unable to obtain diet and weight history from patient at this time  EDW 121.5 kg; current wt 126 kg  Hyperkalemia should improve with iHD; pt may require potassium diet education  Labs: potassium 6.8 (H) Meds: lactulose, fosrenol, ss novolog   Diet Order:   Diet Order            Diet NPO time specified Except for: Sips with Meds  Diet effective now                 EDUCATION NEEDS:   Not appropriate for education at this time  Skin:  Skin Assessment: Reviewed RN Assessment  Last BM:  PTA  Height:   Ht Readings from Last 1 Encounters:  12/09/20 5\' 4"  (1.626 m)    Weight:   Wt Readings from Last 1 Encounters:  12/09/20 126 kg    Ideal Body Weight:  55 kg  BMI:  Body mass index is 47.68 kg/m.  Estimated Nutritional Needs:   Kcal:  1800-2000 kcals  Protein:  110-125 g  Fluid:  1000 mL plus UOP   Kerman Passey MS, RDN, LDN, CNSC Registered Dietitian III Clinical Nutrition RD Pager and On-Call Pager Number Located in [place]

## 2020-12-11 ENCOUNTER — Inpatient Hospital Stay (HOSPITAL_COMMUNITY): Payer: Medicare Other

## 2020-12-11 ENCOUNTER — Encounter: Payer: Self-pay | Admitting: Internal Medicine

## 2020-12-11 LAB — CBC WITH DIFFERENTIAL/PLATELET
Abs Immature Granulocytes: 0.08 10*3/uL — ABNORMAL HIGH (ref 0.00–0.07)
Basophils Absolute: 0 10*3/uL (ref 0.0–0.1)
Basophils Relative: 0 %
Eosinophils Absolute: 0 10*3/uL (ref 0.0–0.5)
Eosinophils Relative: 0 %
HCT: 33.9 % — ABNORMAL LOW (ref 36.0–46.0)
Hemoglobin: 11.1 g/dL — ABNORMAL LOW (ref 12.0–15.0)
Immature Granulocytes: 1 %
Lymphocytes Relative: 12 %
Lymphs Abs: 1.8 10*3/uL (ref 0.7–4.0)
MCH: 30.1 pg (ref 26.0–34.0)
MCHC: 32.7 g/dL (ref 30.0–36.0)
MCV: 91.9 fL (ref 80.0–100.0)
Monocytes Absolute: 1.3 10*3/uL — ABNORMAL HIGH (ref 0.1–1.0)
Monocytes Relative: 9 %
Neutro Abs: 11.4 10*3/uL — ABNORMAL HIGH (ref 1.7–7.7)
Neutrophils Relative %: 78 %
Platelets: 242 10*3/uL (ref 150–400)
RBC: 3.69 MIL/uL — ABNORMAL LOW (ref 3.87–5.11)
RDW: 14.4 % (ref 11.5–15.5)
WBC: 14.5 10*3/uL — ABNORMAL HIGH (ref 4.0–10.5)
nRBC: 0 % (ref 0.0–0.2)

## 2020-12-11 LAB — BASIC METABOLIC PANEL
Anion gap: 18 — ABNORMAL HIGH (ref 5–15)
BUN: 62 mg/dL — ABNORMAL HIGH (ref 6–20)
CO2: 24 mmol/L (ref 22–32)
Calcium: 9.4 mg/dL (ref 8.9–10.3)
Chloride: 97 mmol/L — ABNORMAL LOW (ref 98–111)
Creatinine, Ser: 9.09 mg/dL — ABNORMAL HIGH (ref 0.44–1.00)
GFR, Estimated: 5 mL/min — ABNORMAL LOW (ref >60)
Glucose, Bld: 158 mg/dL — ABNORMAL HIGH (ref 70–99)
Potassium: 4.6 mmol/L (ref 3.5–5.1)
Sodium: 139 mmol/L (ref 135–145)

## 2020-12-11 LAB — GLUCOSE, CAPILLARY
Glucose-Capillary: 204 mg/dL — ABNORMAL HIGH (ref 70–99)
Glucose-Capillary: 141 mg/dL — ABNORMAL HIGH (ref 70–99)
Glucose-Capillary: 128 mg/dL — ABNORMAL HIGH (ref 70–99)

## 2020-12-11 LAB — MAGNESIUM: Magnesium: 2.3 mg/dL (ref 1.7–2.4)

## 2020-12-11 LAB — PROCALCITONIN: Procalcitonin: 0.5 ng/mL

## 2020-12-11 LAB — PHOSPHORUS: Phosphorus: 8.2 mg/dL — ABNORMAL HIGH (ref 2.5–4.6)

## 2020-12-11 MED ORDER — ONDANSETRON HCL 4 MG/2ML IJ SOLN: 4.0000 mg | Freq: Once | INTRAMUSCULAR | Status: DC

## 2020-12-11 NOTE — Progress Notes (Signed)
  [facility] Progress Note   Assessment/ Plan:   **hypoxic resp failure: resolved with HD  **Hyperkalemia:  resolved with HD  **ESRD on HD: HD early Tuesdday.  Normally TTS  **HTN:  improved with UF  **Morbid obesity: had been going to weight loss clinic but says efforts have been abandoned.    **DM: diet controlled  Subjective:    Feeling much better.  Off O2.  For d/c today.     Objective:   BP (!) 180/69 (BP Location: Left Wrist)   Pulse 73   Temp 97.7 F (36.5 C) (Oral)   Resp (!) 23   Ht 5\' 4"  (1.626 m)   Wt 126 kg   LMP 02/02/2015   SpO2 95%   BMI 47.68 kg/m   Physical Exam: Gen: NAD, sitting in chair CVS: RRR Resp: clear Abd: soft nontender Ext: no LE edema ACCESS: RUE AVF  Labs: BMET Recent Labs  Lab 12/09/20 2129 12/09/20 2300 12/10/20 1001 12/10/20 1220 12/11/20 0142  NA 138 138 141 139 139  K 6.3* 6.8* 7.4* 5.2* 4.6  CL 100  --  102 98 97*  CO2 23  --  22 22 24   GLUCOSE 146*  --  175* 157* 158*  BUN 68*  --  71* 34* 62*  CREATININE 12.46*  --  12.78* 7.56* 9.09*  CALCIUM 9.5  --  9.5 9.1 9.4  PHOS  --   --  5.8* 6.8* 8.2*   CBC Recent Labs  Lab 12/09/20 2129 12/09/20 2300 12/10/20 1001 12/11/20 0142  WBC 19.8*  --  12.6* 14.5*  NEUTROABS 16.2*  --  11.4* 11.4*  HGB 11.2* 13.9 10.5* 11.1*  HCT 36.2 41.0 33.5* 33.9*  MCV 94.5  --  94.1 91.9  PLT 276  --  246 242      Medications:    . (feeding supplement) PROSource Plus  30 mL Oral TID BM  . amLODipine  10 mg Oral Daily  . Chlorhexidine Gluconate Cloth  6 each Topical Q0600  . heparin  5,000 Units Subcutaneous Q8H  . insulin aspart  0-6 Units Subcutaneous Q4H  . labetalol  100 mg Oral BID  . lactulose  10 g Oral TID  . lanthanum  1,000 mg Oral BID WC  . multivitamin  1 tablet Oral QHS  . ondansetron (ZOFRAN) IV  4 mg Intravenous Once  . sodium chloride flush  3 mL Intravenous Q12H     Madelon Lips, MD 12/11/2020, 3:11 PM

## 2020-12-11 NOTE — Evaluation (Addendum)
Occupational Therapy Evaluation Patient Details Name: Pamela Campbell MRN: [id number] DOB: 07-06-1961 Today's Date: 12/11/2020    History of Present Illness Pamela Campbell is a 59 y.o. female with medical history significant of Obesity, ESRD on Tuesday Thursday and Saturday, HFpEF, DM HTN, who presents for hypoxia  and hyperkalemia.   Clinical Impression   PTA patient was living with family and was independent with ADLs/IADLs including working full-time. Assist from son for stair negotiation at baseline. Patient currently functioning at baseline level demonstrating toileting/hygiene/clothing management, UB/LB dressing, and grooming tasks standing at sink level with I. Patient with mild DOE after activity. Vitals stable. Patient education on energy conservation during ADLs/IADLs and work simplification. Patient expressed verbal understanding. Patient does not require continued acute occupational therapy services with OT to sign off at this time. Recommendation for return home with assist from family as needed.     Follow Up Recommendations  No OT follow up    Equipment Recommendations  None recommended by OT    Recommendations for Other Services       Precautions / Restrictions Precautions Precautions: Other (comment) Precaution Comments: watch O2 Restrictions Weight Bearing Restrictions: No      Mobility Bed Mobility Overal bed mobility: Modified Independent             General bed mobility comments: Patient seated EOB upon entry    Transfers Overall transfer level: Independent Equipment used: None                  Balance Overall balance assessment: Mild deficits observed, not formally tested                                         ADL either performed or assessed with clinical judgement   ADL Overall ADL's : At baseline                                             Vision   Vision Assessment?: No apparent visual  deficits     Perception     Praxis      Pertinent Vitals/Pain Pain Assessment: No/denies pain     Hand Dominance Right   Extremity/Trunk Assessment Upper Extremity Assessment Upper Extremity Assessment: Overall WFL for tasks assessed   Lower Extremity Assessment Lower Extremity Assessment: Overall WFL for tasks assessed   Cervical / Trunk Assessment Cervical / Trunk Assessment: Normal   Communication Communication Communication: No difficulties   Cognition Arousal/Alertness: Awake/alert Behavior During Therapy: WFL for tasks assessed/performed Overall Cognitive Status: Within Functional Limits for tasks assessed                                     General Comments       Exercises     Shoulder Instructions      Home Living Family/patient expects to be discharged to:: Private residence Living Arrangements: Children (son) Available Help at Discharge: Family Type of Home: House Home Access: Stairs to enter Technical brewer of Steps: 8 Entrance Stairs-Rails:  (has rails, but they are deteriorated) Home Layout: One level     Bathroom Shower/Tub: Tub/shower unit         Home Equipment: Clinical cytogeneticist -  4 wheels          Prior Functioning/Environment Level of Independence: Needs assistance  Gait / Transfers Assistance Needed: No AD. Pt son assists with stair negotiation. ADL's / Homemaking Assistance Needed: Independent            OT Problem List: Cardiopulmonary status limiting activity      OT Treatment/Interventions:      OT Goals(Current goals can be found in the care plan section) Acute Rehab OT Goals Patient Stated Goal: return to work, tolerate more activity  OT Frequency:     Barriers to D/C:            Co-evaluation              AM-PAC OT "6 Clicks" Daily Activity     Outcome Measure Help from another person eating meals?: None Help from another person taking care of personal grooming?: None Help  from another person toileting, which includes using toliet, bedpan, or urinal?: None Help from another person bathing (including washing, rinsing, drying)?: None Help from another person to put on and taking off regular upper body clothing?: None Help from another person to put on and taking off regular lower body clothing?: None 6 Click Score: 24   End of Session Equipment Utilized During Treatment: Gait belt  Activity Tolerance: Patient tolerated treatment well Patient left: in chair;with call bell/phone within reach                   Time: 1014-1030 OT Time Calculation (min): 16 min Charges:  OT General Charges $OT Visit: 1 Visit OT Evaluation $OT Eval Low Complexity: 1 Low  Aeriana Speece H. OTR/L Supplemental OT, Department of rehab services [phone]  Janeva Peaster R H. 12/11/2020, 10:33 AM

## 2020-12-11 NOTE — Progress Notes (Signed)
Does not want Bipap now that not in distress. Will continue to monitor

## 2020-12-11 NOTE — Discharge Summary (Signed)
Physician Discharge Summary   Pamela Campbell XBM:[id number] DOB: 12-10-1961 DOA: 12/09/2020  PCP: [facility] @ [place]  Admit date: 12/09/2020 Discharge date: 12/11/2020  Admitted From: home Disposition:  home Discharging physician: Dwyane Dee, MD  Recommendations for Outpatient Follow-up:  1. Continue ongoing routine outpatient care   Patient discharged to home in Discharge Condition: stable CODE STATUS: Full Diet recommendation:  Diet Orders (From admission, onward)    Start     Ordered   12/10/20 1219  Diet renal with fluid restriction Fluid restriction: 1200 mL Fluid; Room service appropriate? Yes; Fluid consistency: Thin  Diet effective now       Question Answer Comment  Fluid restriction: 1200 mL Fluid   Room service appropriate? Yes   Fluid consistency: Thin      12/10/20 1218          Hospital Course: Pamela Campbell is a 59 yo female with PMH ESRD on HD TThSa who presented to the ER with shortness of breath and hypoxia. She typically endorses feeling more short of breath and volume overloaded on Mondays due to going 3 days until her next dialysis session on Tuesdays from a Saturday. In the ER she was found to be hypoxic in the 70s and was placed on CPAP. She underwent dialysis on the morning of 12/10/2020 and felt tremendously better after dialysis session. CXR on admission was also consistent with volume overload. She was on oxygen after dialysis which was slowly weaned off. She was on room air at time of discharge and had no further desaturations.  She felt back to her normal baseline and was amenable with discharging home and ongoing care outpatient, resuming her normal dialysis schedule.   The patient's chronic medical conditions were treated accordingly per the patient's home medication regimen except as noted.  On day of discharge, patient was felt deemed stable for discharge. Patient/family member advised to call PCP or come back to ER if  needed.   Principal Diagnosis: Acute respiratory failure with hypoxia [facility])  Discharge Diagnoses: Active Hospital Problems   Diagnosis Date Noted  . Hyperkalemia 12/10/2020  . SIRS (systemic inflammatory response syndrome) ([place]) 12/10/2020  . Uncontrolled hypertension 12/30/2017  . Diabetes mellitus type 2 in obese ([place]) 12/30/2017  . Acute on chronic diastolic heart failure ([place]) 12/30/2017  . ESRD on dialysis ([place])   . HLD (hyperlipidemia) 07/17/2015  . Type 2 diabetes mellitus ([place]) 03/27/2015  . Obesity BMI 45 03/25/2015  . Elevated troponin-demand ischemia 03/24/2015    Resolved Hospital Problems   Diagnosis Date Noted Date Resolved  . Acute respiratory failure with hypoxia ([place]) 12/09/2020 12/11/2020  . Hypertensive emergency 10/24/2016 12/11/2020    Discharge Instructions    Increase activity slowly   Complete by: As directed      Allergies as of 12/11/2020      Reactions   Compazine [prochlorperazine Edisylate] Swelling, Other (See Comments)   "The tongue hardens and comes out of mouth" (Tardive dyskinesia) Also made the neck muscles twist.   Nsaids Other (See Comments)   WAS TOLD TO NOT TAKE THESE DUE TO KIDNEY ISSUES   Sensipar [cinacalcet] Nausea And Vomiting   Causes vomiting   Vioxx [rofecoxib] Other (See Comments)   Caused internal bleeding   Other Other (See Comments)   Unnamed "binder" caused EXTREME constipation      Medication List    TAKE these medications   amLODipine 5 MG tablet Commonly known as: NORVASC Take 5 mg by mouth daily  in the afternoon. What changed: Another medication with the same name was removed. Continue taking this medication, and follow the directions you see here.   Calcium Acetate 667 MG Tabs Take 667 mg by mouth at bedtime.   cinacalcet 30 MG tablet Commonly known as: SENSIPAR Take 30 mg by mouth every evening.   Constulose 10 GM/15ML solution Generic drug: lactulose Take 30 g by mouth daily.   furosemide 40 MG  tablet Commonly known as: LASIX Take 40 mg by mouth daily as needed for fluid.   labetalol 100 MG tablet Commonly known as: NORMODYNE Take 100 mg by mouth daily in the afternoon.   lanthanum 1000 MG chewable tablet Commonly known as: FOSRENOL Chew 1,000 mg by mouth 3 (three) times daily with meals.   ondansetron 4 MG disintegrating tablet Commonly known as: ZOFRAN-ODT Take 4 mg by mouth every 6 (six) hours as needed for nausea or vomiting (DISSOLVE ORALLY).   Trulance 3 MG Tabs Generic drug: Plecanatide Take 3 mg by mouth daily as needed (when Constulance is).   Vitamin D (Ergocalciferol) 1.25 MG (50000 UNIT) Caps capsule Commonly known as: DRISDOL Take 1 capsule (50,000 Units total) by mouth every 14 (fourteen) days.       Allergies  Allergen Reactions  . Compazine [Prochlorperazine Edisylate] Swelling and Other (See Comments)    "The tongue hardens and comes out of mouth" (Tardive dyskinesia) Also made the neck muscles twist.  . Nsaids Other (See Comments)    WAS TOLD TO NOT TAKE THESE DUE TO KIDNEY ISSUES  . Sensipar [Cinacalcet] Nausea And Vomiting    Causes vomiting  . Vioxx [Rofecoxib] Other (See Comments)    Caused internal bleeding  . Other Other (See Comments)    Unnamed "binder" caused EXTREME constipation    Consultations: Nephrology  Discharge Exam: BP (!) 180/69 (BP Location: Left Wrist)   Pulse 73   Temp 97.7 F (36.5 C) (Oral)   Resp (!) 23   Ht 5\' 4"  (1.626 m)   Wt 126 kg   LMP 02/02/2015   SpO2 95%   BMI 47.68 kg/m  General appearance:  Sitting up in bed in no distress feeling back to normal self Head: Normocephalic, without obvious abnormality, atraumatic Eyes: EOMI Lungs:  Improved breath sounds, significantly cleared Heart: regular rate and rhythm and S1, S2 normal Abdomen: normal findings: bowel sounds normal and soft, non-tender Extremities: Trace lower extremity edema Skin: mobility and turgor normal Neurologic: Grossly  normal  The results of significant diagnostics from this hospitalization (including imaging, microbiology, ancillary and laboratory) are listed below for reference.   Microbiology: Recent Results (from the past 240 hour(s))  Resp Panel by RT-PCR (Flu A&B, Covid) Nasopharyngeal Swab     Status: None   Collection Time: 12/09/20  9:31 PM   Specimen: Nasopharyngeal Swab; Nasopharyngeal(NP) swabs in vial transport medium  Result Value Ref Range Status   SARS Coronavirus 2 by RT PCR NEGATIVE NEGATIVE Final    Comment: (NOTE) SARS-CoV-2 target nucleic acids are NOT DETECTED.  The SARS-CoV-2 RNA is generally detectable in upper respiratory specimens during the acute phase of infection. The lowest concentration of SARS-CoV-2 viral copies this assay can detect is 138 copies/mL. A negative result does not preclude SARS-Cov-2 infection and should not be used as the sole basis for treatment or other patient management decisions. A negative result may occur with  improper specimen collection/handling, submission of specimen other than nasopharyngeal swab, presence of viral mutation(s) within the areas targeted by this  assay, and inadequate number of viral copies(<138 copies/mL). A negative result must be combined with clinical observations, patient history, and epidemiological information. The expected result is Negative.  Fact Sheet for Patients:  EntrepreneurPulse.com.au  Fact Sheet for Healthcare Providers:  IncredibleEmployment.be  This test is no t yet approved or cleared by the [place] FDA and  has been authorized for detection and/or diagnosis of SARS-CoV-2 by FDA under an Emergency Use Authorization (EUA). This EUA will remain  in effect (meaning this test can be used) for the duration of the COVID-19 declaration under Section 564(b)(1) of the Act, 21 U.S.C.section 360bbb-3(b)(1), unless the authorization is terminated  or revoked sooner.        Influenza A by PCR NEGATIVE NEGATIVE Final   Influenza B by PCR NEGATIVE NEGATIVE Final    Comment: (NOTE) The Xpert Xpress SARS-CoV-2/FLU/RSV plus assay is intended as an aid in the diagnosis of influenza from Nasopharyngeal swab specimens and should not be used as a sole basis for treatment. Nasal washings and aspirates are unacceptable for Xpert Xpress SARS-CoV-2/FLU/RSV testing.  Fact Sheet for Patients: EntrepreneurPulse.com.au  Fact Sheet for Healthcare Providers: IncredibleEmployment.be  This test is not yet approved or cleared by the [place] FDA and has been authorized for detection and/or diagnosis of SARS-CoV-2 by FDA under an Emergency Use Authorization (EUA). This EUA will remain in effect (meaning this test can be used) for the duration of the COVID-19 declaration under Section 564(b)(1) of the Act, 21 U.S.C. section 360bbb-3(b)(1), unless the authorization is terminated or revoked.  Performed at [facility], [place] [address] [place], [place] [address]   Culture, blood (routine x 2)     Status: None (Preliminary result)   Collection Time: 12/09/20 10:49 PM   Specimen: BLOOD LEFT HAND  Result Value Ref Range Status   Specimen Description BLOOD LEFT HAND  Final   Special Requests   Final    BOTTLES DRAWN AEROBIC AND ANAEROBIC Blood Culture adequate volume   Culture   Final    NO GROWTH 1 DAY Performed at [facility], [place] [address], [place], [place] [address]    Report Status PENDING  Incomplete  MRSA PCR Screening     Status: None   Collection Time: 12/10/20 11:55 AM   Specimen: Nasal Mucosa; Nasopharyngeal  Result Value Ref Range Status   MRSA by PCR NEGATIVE NEGATIVE Final    Comment:        The GeneXpert MRSA Assay (FDA approved for NASAL specimens only), is one component of a comprehensive MRSA colonization surveillance program. It is not intended to diagnose MRSA infection nor to guide  or monitor treatment for MRSA infections. Performed at [facility], [place] [address] [place], [place] [address]   Culture, blood (routine x 2)     Status: None (Preliminary result)   Collection Time: 12/10/20 12:20 PM   Specimen: BLOOD LEFT WRIST  Result Value Ref Range Status   Specimen Description BLOOD LEFT WRIST  Final   Special Requests   Final    BOTTLES DRAWN AEROBIC AND ANAEROBIC Blood Culture adequate volume   Culture   Final    NO GROWTH < 24 HOURS Performed at [facility], [place] [address], [place], [place] [address]    Report Status PENDING  Incomplete  Respiratory Panel by PCR     Status: None   Collection Time: 12/10/20  9:57 PM   Specimen: Nasopharyngeal Swab; Respiratory  Result Value Ref  Range Status   Adenovirus NOT DETECTED NOT DETECTED Final   Coronavirus 229E NOT DETECTED NOT DETECTED Final    Comment: (NOTE) The Coronavirus on the Respiratory Panel, DOES NOT test for the novel  Coronavirus (2019 nCoV)    Coronavirus HKU1 NOT DETECTED NOT DETECTED Final   Coronavirus NL63 NOT DETECTED NOT DETECTED Final   Coronavirus OC43 NOT DETECTED NOT DETECTED Final   Metapneumovirus NOT DETECTED NOT DETECTED Final   Rhinovirus / Enterovirus NOT DETECTED NOT DETECTED Final   Influenza A NOT DETECTED NOT DETECTED Final   Influenza B NOT DETECTED NOT DETECTED Final   Parainfluenza Virus 1 NOT DETECTED NOT DETECTED Final   Parainfluenza Virus 2 NOT DETECTED NOT DETECTED Final   Parainfluenza Virus 3 NOT DETECTED NOT DETECTED Final   Parainfluenza Virus 4 NOT DETECTED NOT DETECTED Final   Respiratory Syncytial Virus NOT DETECTED NOT DETECTED Final   Bordetella pertussis NOT DETECTED NOT DETECTED Final   Bordetella Parapertussis NOT DETECTED NOT DETECTED Final   Chlamydophila pneumoniae NOT DETECTED NOT DETECTED Final   Mycoplasma pneumoniae NOT DETECTED NOT DETECTED Final    Comment: Performed at [facility], [place] [address] [place], [place] [address]      Labs: BNP (last 3 results) Recent Labs    12/09/20 2129  BNP 735.3*   Basic Metabolic Panel: Recent Labs  Lab 12/09/20 2129 12/09/20 2300 12/10/20 1001 12/10/20 1220 12/11/20 0142  NA 138 138 141 139 139  K 6.3* 6.8* 7.4* 5.2* 4.6  CL 100  --  102 98 97*  CO2 23  --  22 22 24   GLUCOSE 146*  --  175* 157* 158*  BUN 68*  --  71* 34* 62*  CREATININE 12.46*  --  12.78* 7.56* 9.09*  CALCIUM 9.5  --  9.5 9.1 9.4  MG  --   --  2.9*  --  2.3  PHOS  --   --  5.8* 6.8* 8.2*   Liver Function Tests: Recent Labs  Lab 12/09/20 2129 12/10/20 1001 12/10/20 1220  AST 37 26  --   ALT 38 34  --   ALKPHOS 128* 118  --   BILITOT 0.5 0.6  --   PROT 7.3 7.7  --   ALBUMIN 3.5 3.5 3.5   No results for input(s): LIPASE, AMYLASE in the last 168 hours. No results for input(s): AMMONIA in the last 168 hours. CBC: Recent Labs  Lab 12/09/20 2129 12/09/20 2300 12/10/20 1001 12/11/20 0142  WBC 19.8*  --  12.6* 14.5*  NEUTROABS 16.2*  --  11.4* 11.4*  HGB 11.2* 13.9 10.5* 11.1*  HCT 36.2 41.0 33.5* 33.9*  MCV 94.5  --  94.1 91.9  PLT 276  --  246 242   Cardiac Enzymes: No results for input(s): CKTOTAL, CKMB, CKMBINDEX, TROPONINI in the last 168 hours. BNP: Invalid input(s): POCBNP CBG: Recent Labs  Lab 12/10/20 1631 12/10/20 1947 12/10/20 2324 12/11/20 0426 12/11/20 0752  GLUCAP 262* 239* 204* 141* 128*   D-Dimer No results for input(s): DDIMER in the last 72 hours. Hgb A1c Recent Labs    12/10/20 0148  HGBA1C 6.8*   Lipid Profile No results for input(s): CHOL, HDL, LDLCALC, TRIG, CHOLHDL, LDLDIRECT in the last 72 hours. Thyroid function studies Recent Labs    12/10/20 1004  TSH 1.331   Anemia work up No results for input(s): VITAMINB12, FOLATE, FERRITIN, TIBC, IRON, RETICCTPCT in the last 72 hours. Urinalysis    Component Value Date/Time  COLORURINE YELLOW 10/26/2016 [place] 10/26/2016 1518   LABSPEC 1.026 10/26/2016 1518   PHURINE  5.5 10/26/2016 1518   GLUCOSEU 250 (A) 10/26/2016 1518   HGBUR TRACE (A) 10/26/2016 1518   BILIRUBINUR NEGATIVE 10/26/2016 1518   KETONESUR NEGATIVE 10/26/2016 1518   PROTEINUR >300 (A) 10/26/2016 1518   UROBILINOGEN 0.2 03/24/2015 0037   NITRITE NEGATIVE 10/26/2016 1518   LEUKOCYTESUR NEGATIVE 10/26/2016 1518   Sepsis Labs Invalid input(s): PROCALCITONIN,  WBC,  LACTICIDVEN Microbiology Recent Results (from the past 240 hour(s))  Resp Panel by RT-PCR (Flu A&B, Covid) Nasopharyngeal Swab     Status: None   Collection Time: 12/09/20  9:31 PM   Specimen: Nasopharyngeal Swab; Nasopharyngeal(NP) swabs in vial transport medium  Result Value Ref Range Status   SARS Coronavirus 2 by RT PCR NEGATIVE NEGATIVE Final    Comment: (NOTE) SARS-CoV-2 target nucleic acids are NOT DETECTED.  The SARS-CoV-2 RNA is generally detectable in upper respiratory specimens during the acute phase of infection. The lowest concentration of SARS-CoV-2 viral copies this assay can detect is 138 copies/mL. A negative result does not preclude SARS-Cov-2 infection and should not be used as the sole basis for treatment or other patient management decisions. A negative result may occur with  improper specimen collection/handling, submission of specimen other than nasopharyngeal swab, presence of viral mutation(s) within the areas targeted by this assay, and inadequate number of viral copies(<138 copies/mL). A negative result must be combined with clinical observations, patient history, and epidemiological information. The expected result is Negative.  Fact Sheet for Patients:  EntrepreneurPulse.com.au  Fact Sheet for Healthcare Providers:  IncredibleEmployment.be  This test is no t yet approved or cleared by the [place] FDA and  has been authorized for detection and/or diagnosis of SARS-CoV-2 by FDA under an Emergency Use Authorization (EUA). This EUA will remain  in  effect (meaning this test can be used) for the duration of the COVID-19 declaration under Section 564(b)(1) of the Act, 21 U.S.C.section 360bbb-3(b)(1), unless the authorization is terminated  or revoked sooner.       Influenza A by PCR NEGATIVE NEGATIVE Final   Influenza B by PCR NEGATIVE NEGATIVE Final    Comment: (NOTE) The Xpert Xpress SARS-CoV-2/FLU/RSV plus assay is intended as an aid in the diagnosis of influenza from Nasopharyngeal swab specimens and should not be used as a sole basis for treatment. Nasal washings and aspirates are unacceptable for Xpert Xpress SARS-CoV-2/FLU/RSV testing.  Fact Sheet for Patients: EntrepreneurPulse.com.au  Fact Sheet for Healthcare Providers: IncredibleEmployment.be  This test is not yet approved or cleared by the [place] FDA and has been authorized for detection and/or diagnosis of SARS-CoV-2 by FDA under an Emergency Use Authorization (EUA). This EUA will remain in effect (meaning this test can be used) for the duration of the COVID-19 declaration under Section 564(b)(1) of the Act, 21 U.S.C. section 360bbb-3(b)(1), unless the authorization is terminated or revoked.  Performed at [facility], [place] [address], [place], [place] [address]   Culture, blood (routine x 2)     Status: None (Preliminary result)   Collection Time: 12/09/20 10:49 PM   Specimen: BLOOD LEFT HAND  Result Value Ref Range Status   Specimen Description BLOOD LEFT HAND  Final   Special Requests   Final    BOTTLES DRAWN AEROBIC AND ANAEROBIC Blood Culture adequate volume   Culture   Final    NO GROWTH 1 DAY Performed at [facility], [address] [place], [place] [address]    Report Status PENDING  Incomplete  MRSA PCR Screening     Status: None   Collection Time: 12/10/20 11:55 AM   Specimen: Nasal Mucosa; Nasopharyngeal  Result Value Ref Range Status   MRSA by PCR NEGATIVE NEGATIVE Final    Comment:         The GeneXpert MRSA Assay (FDA approved for NASAL specimens only), is one component of a comprehensive MRSA colonization surveillance program. It is not intended to diagnose MRSA infection nor to guide or monitor treatment for MRSA infections. Performed at [facility], [place] [address], [place], [place] [address]   Culture, blood (routine x 2)     Status: None (Preliminary result)   Collection Time: 12/10/20 12:20 PM   Specimen: BLOOD LEFT WRIST  Result Value Ref Range Status   Specimen Description BLOOD LEFT WRIST  Final   Special Requests   Final    BOTTLES DRAWN AEROBIC AND ANAEROBIC Blood Culture adequate volume   Culture   Final    NO GROWTH < 24 HOURS Performed at [facility], [place] [address], [place], [place] [address]    Report Status PENDING  Incomplete  Respiratory Panel by PCR     Status: None   Collection Time: 12/10/20  9:57 PM   Specimen: Nasopharyngeal Swab; Respiratory  Result Value Ref Range Status   Adenovirus NOT DETECTED NOT DETECTED Final   Coronavirus 229E NOT DETECTED NOT DETECTED Final    Comment: (NOTE) The Coronavirus on the Respiratory Panel, DOES NOT test for the novel  Coronavirus (2019 nCoV)    Coronavirus HKU1 NOT DETECTED NOT DETECTED Final   Coronavirus NL63 NOT DETECTED NOT DETECTED Final   Coronavirus OC43 NOT DETECTED NOT DETECTED Final   Metapneumovirus NOT DETECTED NOT DETECTED Final   Rhinovirus / Enterovirus NOT DETECTED NOT DETECTED Final   Influenza A NOT DETECTED NOT DETECTED Final   Influenza B NOT DETECTED NOT DETECTED Final   Parainfluenza Virus 1 NOT DETECTED NOT DETECTED Final   Parainfluenza Virus 2 NOT DETECTED NOT DETECTED Final   Parainfluenza Virus 3 NOT DETECTED NOT DETECTED Final   Parainfluenza Virus 4 NOT DETECTED NOT DETECTED Final   Respiratory Syncytial Virus NOT DETECTED NOT DETECTED Final   Bordetella pertussis NOT DETECTED NOT DETECTED Final   Bordetella Parapertussis NOT DETECTED NOT DETECTED  Final   Chlamydophila pneumoniae NOT DETECTED NOT DETECTED Final   Mycoplasma pneumoniae NOT DETECTED NOT DETECTED Final    Comment: Performed at [facility], [place]. [address], [place], [place] [address]    Procedures/Studies: DG Chest 1 View  Result Date: 12/09/2020 CLINICAL DATA:  Shortness of breath EXAM: CHEST  1 VIEW COMPARISON:  11/05/2020 FINDINGS: Single frontal view of the chest demonstrates an enlarged cardiac silhouette. There is increased central vascular prominence with interval development of bilateral interstitial opacities and trace effusions. No pneumothorax. IMPRESSION: 1. Worsening volume status, with interval development of interstitial edema and mild CHF. Electronically Signed   By: Randa Ngo M.D.   On: 12/09/2020 21:39   DG Chest 2 View  Result Date: 12/10/2020 CLINICAL DATA:  Leukocytosis.  Shortness of breath. EXAM: CHEST - 2 VIEW COMPARISON:  12/09/2020. FINDINGS: Cardiomegaly. Pulmonary venous congestion. Bilateral interstitial prominence. Findings consistent with interstitial edema and or pneumonitis. Small bilateral pleural effusions. No pneumothorax. IMPRESSION: 1. Cardiomegaly with pulmonary venous congestion. 2. Bilateral interstitial prominence consistent with interstitial edema and or pneumonitis. Small bilateral pleural  effusions. Similar findings noted on prior exam. Electronically Signed   By: [place]   On: 12/10/2020 12:10   DG CHEST PORT 1 VIEW  Result Date: 12/11/2020 CLINICAL DATA:  Pulmonary edema EXAM: PORTABLE CHEST 1 VIEW COMPARISON:  December 10, 2020 FINDINGS: There is cardiomegaly with mild pulmonary venous hypertension. No edema or airspace opacity. No adenopathy. No bone lesions. IMPRESSION: Cardiomegaly with a degree of pulmonary vascular congestion. Lungs clear. No adenopathy. Electronically Signed   By: Lowella Grip III M.D.   On: 12/11/2020 08:19   ECHOCARDIOGRAM COMPLETE  Result Date: 12/10/2020    ECHOCARDIOGRAM  REPORT   Patient Name:   MALLORY SCHAAD Date of Exam: 12/10/2020 Medical Rec #:  [id number]      Height:       64.0 in Accession #:    [id number]     Weight:       277.8 lb Date of Birth:  04/25/1961      BSA:          2.250 m Patient Age:    89 years       BP:           138/62 mmHg Patient Gender: F              HR:           83 bpm. Exam Location:  Inpatient Procedure: 2D Echo, Cardiac Doppler and Color Doppler Indications:    CHF-Acute Diastolic F02.63  History:        Patient has prior history of Echocardiogram examinations, most                 recent 12/31/2017. CHF, Signs/Symptoms:Dyspnea; Risk                 Factors:Hypertension, Diabetes and Former Smoker.  Sonographer:    Vickie Epley RDCS Referring Phys: [id number]  1. Left ventricular ejection fraction, by estimation, is 55 to 60%. The left ventricle has normal function. The left ventricle has no regional wall motion abnormalities. There is mild left ventricular hypertrophy. Left ventricular diastolic parameters are consistent with Grade I diastolic dysfunction (impaired relaxation). Elevated left ventricular end-diastolic pressure.  2. Right ventricular systolic function is normal. The right ventricular size is normal. There is normal pulmonary artery systolic pressure. The estimated right ventricular systolic pressure is 85.0 mmHg.  3. The mitral valve is abnormal. Leaflet thickening is noted. Trivial mitral valve regurgitation.  4. The aortic valve is tricuspid. Aortic valve regurgitation is not visualized.  5. The inferior vena cava is normal in size with greater than 50% respiratory variability, suggesting right atrial pressure of 3 mmHg. Comparison(s): Changes from prior study are noted. 12/31/17: LVEF 60-65%, grade 1 DD. FINDINGS  Left Ventricle: Left ventricular ejection fraction, by estimation, is 55 to 60%. The left ventricle has normal function. The left ventricle has no regional wall motion abnormalities. The left  ventricular internal cavity size was normal in size. There is  mild left ventricular hypertrophy. Left ventricular diastolic parameters are consistent with Grade I diastolic dysfunction (impaired relaxation). Elevated left ventricular end-diastolic pressure. Right Ventricle: The right ventricular size is normal. No increase in right ventricular wall thickness. Right ventricular systolic function is normal. There is normal pulmonary artery systolic pressure. The tricuspid regurgitant velocity is 2.74 m/s, and  with an assumed right atrial pressure of 3 mmHg, the estimated right ventricular systolic pressure is 27.7 mmHg. Left Atrium: Left atrial size was normal in  size. Right [place]: Right atrial size was normal in size. Pericardium: There is no evidence of pericardial effusion. Mitral Valve: The mitral valve is abnormal. There is mild thickening of the mitral valve leaflet(s). Trivial mitral valve regurgitation. Tricuspid Valve: The tricuspid valve is grossly normal. Tricuspid valve regurgitation is trivial. Aortic Valve: The aortic valve is tricuspid. Aortic valve regurgitation is not visualized. Pulmonic Valve: The pulmonic valve was normal in structure. Pulmonic valve regurgitation is not visualized. Aorta: The aortic root and ascending aorta are structurally normal, with no evidence of dilitation. Venous: The inferior vena cava is normal in size with greater than 50% respiratory variability, suggesting right atrial pressure of 3 mmHg. IAS/Shunts: No atrial level shunt detected by color flow Doppler.  LEFT VENTRICLE PLAX 2D LVIDd:         4.20 cm      Diastology LVIDs:         3.10 cm      LV e' medial:    5.92 cm/s LV PW:         0.90 cm      LV E/e' medial:  18.2 LV IVS:        0.90 cm      LV e' lateral:   6.24 cm/s LVOT diam:     2.10 cm      LV E/e' lateral: 17.3 LV SV:         112 LV SV Index:   50 LVOT Area:     3.46 cm  LV Volumes (MOD) LV vol d, MOD A2C: 115.0 ml LV vol d, MOD A4C: 121.0 ml LV vol s,  MOD A2C: 43.2 ml LV vol s, MOD A4C: 55.7 ml LV SV MOD A2C:     71.8 ml LV SV MOD A4C:     121.0 ml LV SV MOD BP:      69.8 ml RIGHT VENTRICLE RV S prime:     16.80 cm/s TAPSE (M-mode): 2.8 cm LEFT ATRIUM             Index       RIGHT ATRIUM          Index LA diam:        3.70 cm 1.64 cm/m  RA Area:     9.65 cm LA Vol (A2C):   36.7 ml 16.31 ml/m RA Volume:   18.20 ml 8.09 ml/m LA Vol (A4C):   41.1 ml 18.27 ml/m LA Biplane Vol: 41.4 ml 18.40 ml/m  AORTIC VALVE LVOT Vmax:   155.00 cm/s LVOT Vmean:  108.000 cm/s LVOT VTI:    0.323 m  AORTA Ao Root diam: 2.90 cm MITRAL VALVE                TRICUSPID VALVE MV Area (PHT): 3.37 cm     TR Peak grad:   30.0 mmHg MV Decel Time: 225 msec     TR Vmax:        274.00 cm/s MV E velocity: 108.00 cm/s MV A velocity: 98.00 cm/s   SHUNTS MV E/A ratio:  1.10         Systemic VTI:  0.32 m                             Systemic Diam: 2.10 cm Lyman Bishop MD Electronically signed by Lyman Bishop MD Signature Date/Time: 12/10/2020/5:16:30 PM    Final      Time coordinating discharge: Over 30 minutes  Dwyane Dee, MD  [facility] 12/11/2020, 5:09 PM

## 2020-12-11 NOTE — Plan of Care (Signed)
°  Problem: Fluid Volume: Goal: Fluid volume balance will be maintained or improved Outcome: Not Progressing   Problem: Respiratory: Goal: Respiratory symptoms related to disease process will be avoided Outcome: Not Progressing

## 2020-12-15 LAB — CULTURE, BLOOD (ROUTINE X 2)
Culture: NO GROWTH
Culture: NO GROWTH
Special Requests: ADEQUATE
Special Requests: ADEQUATE

## 2021-06-20 DIAGNOSIS — I10 Essential (primary) hypertension: Secondary | ICD-10-CM | POA: Diagnosis not present

## 2021-06-20 DIAGNOSIS — D631 Anemia in chronic kidney disease: Secondary | ICD-10-CM | POA: Diagnosis not present

## 2021-06-20 DIAGNOSIS — N186 End stage renal disease: Secondary | ICD-10-CM | POA: Diagnosis not present

## 2021-06-21 DIAGNOSIS — D631 Anemia in chronic kidney disease: Secondary | ICD-10-CM | POA: Diagnosis not present

## 2021-06-21 DIAGNOSIS — N2581 Secondary hyperparathyroidism of renal origin: Secondary | ICD-10-CM | POA: Diagnosis not present

## 2021-06-21 DIAGNOSIS — N186 End stage renal disease: Secondary | ICD-10-CM | POA: Diagnosis not present

## 2021-06-21 DIAGNOSIS — D509 Iron deficiency anemia, unspecified: Secondary | ICD-10-CM | POA: Diagnosis not present

## 2021-06-21 DIAGNOSIS — I10 Essential (primary) hypertension: Secondary | ICD-10-CM | POA: Diagnosis not present

## 2021-06-24 DIAGNOSIS — N186 End stage renal disease: Secondary | ICD-10-CM | POA: Diagnosis not present

## 2021-06-24 DIAGNOSIS — I10 Essential (primary) hypertension: Secondary | ICD-10-CM | POA: Diagnosis not present

## 2021-06-24 DIAGNOSIS — N2581 Secondary hyperparathyroidism of renal origin: Secondary | ICD-10-CM | POA: Diagnosis not present

## 2021-06-24 DIAGNOSIS — D509 Iron deficiency anemia, unspecified: Secondary | ICD-10-CM | POA: Diagnosis not present

## 2021-06-24 DIAGNOSIS — D631 Anemia in chronic kidney disease: Secondary | ICD-10-CM | POA: Diagnosis not present

## 2021-06-26 DIAGNOSIS — D509 Iron deficiency anemia, unspecified: Secondary | ICD-10-CM | POA: Diagnosis not present

## 2021-06-26 DIAGNOSIS — N186 End stage renal disease: Secondary | ICD-10-CM | POA: Diagnosis not present

## 2021-06-26 DIAGNOSIS — D631 Anemia in chronic kidney disease: Secondary | ICD-10-CM | POA: Diagnosis not present

## 2021-06-26 DIAGNOSIS — E119 Type 2 diabetes mellitus without complications: Secondary | ICD-10-CM | POA: Diagnosis not present

## 2021-06-26 DIAGNOSIS — I10 Essential (primary) hypertension: Secondary | ICD-10-CM | POA: Diagnosis not present

## 2021-06-26 DIAGNOSIS — N2581 Secondary hyperparathyroidism of renal origin: Secondary | ICD-10-CM | POA: Diagnosis not present

## 2021-06-28 DIAGNOSIS — N2581 Secondary hyperparathyroidism of renal origin: Secondary | ICD-10-CM | POA: Diagnosis not present

## 2021-06-28 DIAGNOSIS — D509 Iron deficiency anemia, unspecified: Secondary | ICD-10-CM | POA: Diagnosis not present

## 2021-06-28 DIAGNOSIS — I10 Essential (primary) hypertension: Secondary | ICD-10-CM | POA: Diagnosis not present

## 2021-06-28 DIAGNOSIS — N186 End stage renal disease: Secondary | ICD-10-CM | POA: Diagnosis not present

## 2021-06-28 DIAGNOSIS — D631 Anemia in chronic kidney disease: Secondary | ICD-10-CM | POA: Diagnosis not present

## 2021-07-01 DIAGNOSIS — D509 Iron deficiency anemia, unspecified: Secondary | ICD-10-CM | POA: Diagnosis not present

## 2021-07-01 DIAGNOSIS — N186 End stage renal disease: Secondary | ICD-10-CM | POA: Diagnosis not present

## 2021-07-01 DIAGNOSIS — N2581 Secondary hyperparathyroidism of renal origin: Secondary | ICD-10-CM | POA: Diagnosis not present

## 2021-07-01 DIAGNOSIS — D631 Anemia in chronic kidney disease: Secondary | ICD-10-CM | POA: Diagnosis not present

## 2021-07-01 DIAGNOSIS — I10 Essential (primary) hypertension: Secondary | ICD-10-CM | POA: Diagnosis not present

## 2021-07-03 DIAGNOSIS — N2581 Secondary hyperparathyroidism of renal origin: Secondary | ICD-10-CM | POA: Diagnosis not present

## 2021-07-03 DIAGNOSIS — D631 Anemia in chronic kidney disease: Secondary | ICD-10-CM | POA: Diagnosis not present

## 2021-07-03 DIAGNOSIS — N186 End stage renal disease: Secondary | ICD-10-CM | POA: Diagnosis not present

## 2021-07-03 DIAGNOSIS — D509 Iron deficiency anemia, unspecified: Secondary | ICD-10-CM | POA: Diagnosis not present

## 2021-07-03 DIAGNOSIS — I10 Essential (primary) hypertension: Secondary | ICD-10-CM | POA: Diagnosis not present

## 2021-07-05 DIAGNOSIS — N186 End stage renal disease: Secondary | ICD-10-CM | POA: Diagnosis not present

## 2021-07-05 DIAGNOSIS — N2581 Secondary hyperparathyroidism of renal origin: Secondary | ICD-10-CM | POA: Diagnosis not present

## 2021-07-05 DIAGNOSIS — D631 Anemia in chronic kidney disease: Secondary | ICD-10-CM | POA: Diagnosis not present

## 2021-07-05 DIAGNOSIS — I10 Essential (primary) hypertension: Secondary | ICD-10-CM | POA: Diagnosis not present

## 2021-07-05 DIAGNOSIS — D509 Iron deficiency anemia, unspecified: Secondary | ICD-10-CM | POA: Diagnosis not present

## 2021-07-08 DIAGNOSIS — D631 Anemia in chronic kidney disease: Secondary | ICD-10-CM | POA: Diagnosis not present

## 2021-07-08 DIAGNOSIS — I10 Essential (primary) hypertension: Secondary | ICD-10-CM | POA: Diagnosis not present

## 2021-07-08 DIAGNOSIS — N186 End stage renal disease: Secondary | ICD-10-CM | POA: Diagnosis not present

## 2021-07-08 DIAGNOSIS — D509 Iron deficiency anemia, unspecified: Secondary | ICD-10-CM | POA: Diagnosis not present

## 2021-07-08 DIAGNOSIS — N2581 Secondary hyperparathyroidism of renal origin: Secondary | ICD-10-CM | POA: Diagnosis not present

## 2021-07-10 DIAGNOSIS — I10 Essential (primary) hypertension: Secondary | ICD-10-CM | POA: Diagnosis not present

## 2021-07-10 DIAGNOSIS — N2581 Secondary hyperparathyroidism of renal origin: Secondary | ICD-10-CM | POA: Diagnosis not present

## 2021-07-10 DIAGNOSIS — D631 Anemia in chronic kidney disease: Secondary | ICD-10-CM | POA: Diagnosis not present

## 2021-07-10 DIAGNOSIS — D509 Iron deficiency anemia, unspecified: Secondary | ICD-10-CM | POA: Diagnosis not present

## 2021-07-10 DIAGNOSIS — N186 End stage renal disease: Secondary | ICD-10-CM | POA: Diagnosis not present

## 2021-07-12 DIAGNOSIS — I10 Essential (primary) hypertension: Secondary | ICD-10-CM | POA: Diagnosis not present

## 2021-07-12 DIAGNOSIS — D631 Anemia in chronic kidney disease: Secondary | ICD-10-CM | POA: Diagnosis not present

## 2021-07-12 DIAGNOSIS — N186 End stage renal disease: Secondary | ICD-10-CM | POA: Diagnosis not present

## 2021-07-12 DIAGNOSIS — N2581 Secondary hyperparathyroidism of renal origin: Secondary | ICD-10-CM | POA: Diagnosis not present

## 2021-07-12 DIAGNOSIS — D509 Iron deficiency anemia, unspecified: Secondary | ICD-10-CM | POA: Diagnosis not present

## 2021-07-15 DIAGNOSIS — N186 End stage renal disease: Secondary | ICD-10-CM | POA: Diagnosis not present

## 2021-07-15 DIAGNOSIS — I10 Essential (primary) hypertension: Secondary | ICD-10-CM | POA: Diagnosis not present

## 2021-07-15 DIAGNOSIS — N2581 Secondary hyperparathyroidism of renal origin: Secondary | ICD-10-CM | POA: Diagnosis not present

## 2021-07-15 DIAGNOSIS — D509 Iron deficiency anemia, unspecified: Secondary | ICD-10-CM | POA: Diagnosis not present

## 2021-07-15 DIAGNOSIS — D631 Anemia in chronic kidney disease: Secondary | ICD-10-CM | POA: Diagnosis not present

## 2021-07-17 DIAGNOSIS — D631 Anemia in chronic kidney disease: Secondary | ICD-10-CM | POA: Diagnosis not present

## 2021-07-17 DIAGNOSIS — D509 Iron deficiency anemia, unspecified: Secondary | ICD-10-CM | POA: Diagnosis not present

## 2021-07-17 DIAGNOSIS — I10 Essential (primary) hypertension: Secondary | ICD-10-CM | POA: Diagnosis not present

## 2021-07-17 DIAGNOSIS — N2581 Secondary hyperparathyroidism of renal origin: Secondary | ICD-10-CM | POA: Diagnosis not present

## 2021-07-17 DIAGNOSIS — N186 End stage renal disease: Secondary | ICD-10-CM | POA: Diagnosis not present

## 2021-07-19 DIAGNOSIS — N186 End stage renal disease: Secondary | ICD-10-CM | POA: Diagnosis not present

## 2021-07-19 DIAGNOSIS — I10 Essential (primary) hypertension: Secondary | ICD-10-CM | POA: Diagnosis not present

## 2021-07-19 DIAGNOSIS — N2581 Secondary hyperparathyroidism of renal origin: Secondary | ICD-10-CM | POA: Diagnosis not present

## 2021-07-19 DIAGNOSIS — D631 Anemia in chronic kidney disease: Secondary | ICD-10-CM | POA: Diagnosis not present

## 2021-07-19 DIAGNOSIS — D509 Iron deficiency anemia, unspecified: Secondary | ICD-10-CM | POA: Diagnosis not present

## 2021-07-21 DIAGNOSIS — D631 Anemia in chronic kidney disease: Secondary | ICD-10-CM | POA: Diagnosis not present

## 2021-07-21 DIAGNOSIS — I10 Essential (primary) hypertension: Secondary | ICD-10-CM | POA: Diagnosis not present

## 2021-07-21 DIAGNOSIS — N186 End stage renal disease: Secondary | ICD-10-CM | POA: Diagnosis not present

## 2021-07-22 DIAGNOSIS — D509 Iron deficiency anemia, unspecified: Secondary | ICD-10-CM | POA: Diagnosis not present

## 2021-07-22 DIAGNOSIS — N186 End stage renal disease: Secondary | ICD-10-CM | POA: Diagnosis not present

## 2021-07-22 DIAGNOSIS — N2581 Secondary hyperparathyroidism of renal origin: Secondary | ICD-10-CM | POA: Diagnosis not present

## 2021-07-22 DIAGNOSIS — D631 Anemia in chronic kidney disease: Secondary | ICD-10-CM | POA: Diagnosis not present

## 2021-07-24 DIAGNOSIS — D509 Iron deficiency anemia, unspecified: Secondary | ICD-10-CM | POA: Diagnosis not present

## 2021-07-24 DIAGNOSIS — N2581 Secondary hyperparathyroidism of renal origin: Secondary | ICD-10-CM | POA: Diagnosis not present

## 2021-07-24 DIAGNOSIS — D631 Anemia in chronic kidney disease: Secondary | ICD-10-CM | POA: Diagnosis not present

## 2021-07-24 DIAGNOSIS — N186 End stage renal disease: Secondary | ICD-10-CM | POA: Diagnosis not present

## 2021-07-25 DIAGNOSIS — T82848A Pain from vascular prosthetic devices, implants and grafts, initial encounter: Secondary | ICD-10-CM | POA: Diagnosis not present

## 2021-07-25 DIAGNOSIS — Z992 Dependence on renal dialysis: Secondary | ICD-10-CM | POA: Diagnosis not present

## 2021-07-25 DIAGNOSIS — N186 End stage renal disease: Secondary | ICD-10-CM | POA: Diagnosis not present

## 2021-07-26 ENCOUNTER — Observation Stay (HOSPITAL_COMMUNITY)
Admission: EM | Admit: 2021-07-26 | Discharge: 2021-07-27 | Disposition: A | Discharge status: Home / Self Care | Payer: Medicare HMO | Attending: Family Medicine | Admitting: Family Medicine

## 2021-07-26 ENCOUNTER — Other Ambulatory Visit: Payer: Self-pay

## 2021-07-26 ENCOUNTER — Encounter (HOSPITAL_COMMUNITY): Payer: Self-pay

## 2021-07-26 ENCOUNTER — Emergency Department (HOSPITAL_COMMUNITY): Payer: Medicare HMO

## 2021-07-26 DIAGNOSIS — I132 Hypertensive heart and chronic kidney disease with heart failure and with stage 5 chronic kidney disease, or end stage renal disease: Secondary | ICD-10-CM | POA: Diagnosis not present

## 2021-07-26 DIAGNOSIS — J811 Chronic pulmonary edema: Secondary | ICD-10-CM | POA: Diagnosis not present

## 2021-07-26 DIAGNOSIS — I2510 Atherosclerotic heart disease of native coronary artery without angina pectoris: Secondary | ICD-10-CM | POA: Insufficient documentation

## 2021-07-26 DIAGNOSIS — E785 Hyperlipidemia, unspecified: Secondary | ICD-10-CM | POA: Diagnosis present

## 2021-07-26 DIAGNOSIS — E669 Obesity, unspecified: Secondary | ICD-10-CM

## 2021-07-26 DIAGNOSIS — E8779 Other fluid overload: Secondary | ICD-10-CM | POA: Diagnosis not present

## 2021-07-26 DIAGNOSIS — Z20822 Contact with and (suspected) exposure to covid-19: Secondary | ICD-10-CM | POA: Diagnosis not present

## 2021-07-26 DIAGNOSIS — E875 Hyperkalemia: Principal | ICD-10-CM | POA: Insufficient documentation

## 2021-07-26 DIAGNOSIS — M6281 Muscle weakness (generalized): Secondary | ICD-10-CM | POA: Insufficient documentation

## 2021-07-26 DIAGNOSIS — G4730 Sleep apnea, unspecified: Secondary | ICD-10-CM | POA: Diagnosis present

## 2021-07-26 DIAGNOSIS — I5033 Acute on chronic diastolic (congestive) heart failure: Secondary | ICD-10-CM | POA: Diagnosis not present

## 2021-07-26 DIAGNOSIS — N186 End stage renal disease: Secondary | ICD-10-CM | POA: Diagnosis not present

## 2021-07-26 DIAGNOSIS — E1122 Type 2 diabetes mellitus with diabetic chronic kidney disease: Secondary | ICD-10-CM | POA: Diagnosis not present

## 2021-07-26 DIAGNOSIS — R778 Other specified abnormalities of plasma proteins: Secondary | ICD-10-CM | POA: Diagnosis present

## 2021-07-26 DIAGNOSIS — E7800 Pure hypercholesterolemia, unspecified: Secondary | ICD-10-CM | POA: Diagnosis present

## 2021-07-26 DIAGNOSIS — Z87891 Personal history of nicotine dependence: Secondary | ICD-10-CM | POA: Diagnosis not present

## 2021-07-26 DIAGNOSIS — E7849 Other hyperlipidemia: Secondary | ICD-10-CM

## 2021-07-26 DIAGNOSIS — R0902 Hypoxemia: Secondary | ICD-10-CM | POA: Diagnosis not present

## 2021-07-26 DIAGNOSIS — I517 Cardiomegaly: Secondary | ICD-10-CM | POA: Diagnosis not present

## 2021-07-26 DIAGNOSIS — Z992 Dependence on renal dialysis: Secondary | ICD-10-CM | POA: Insufficient documentation

## 2021-07-26 DIAGNOSIS — I10 Essential (primary) hypertension: Secondary | ICD-10-CM | POA: Diagnosis present

## 2021-07-26 DIAGNOSIS — E6609 Other obesity due to excess calories: Secondary | ICD-10-CM

## 2021-07-26 DIAGNOSIS — E1169 Type 2 diabetes mellitus with other specified complication: Secondary | ICD-10-CM | POA: Diagnosis not present

## 2021-07-26 DIAGNOSIS — Z79899 Other long term (current) drug therapy: Secondary | ICD-10-CM | POA: Diagnosis not present

## 2021-07-26 DIAGNOSIS — E119 Type 2 diabetes mellitus without complications: Secondary | ICD-10-CM

## 2021-07-26 DIAGNOSIS — E1100 Type 2 diabetes mellitus with hyperosmolarity without nonketotic hyperglycemic-hyperosmolar coma (NKHHC): Secondary | ICD-10-CM

## 2021-07-26 DIAGNOSIS — R0602 Shortness of breath: Secondary | ICD-10-CM | POA: Diagnosis not present

## 2021-07-26 LAB — CBC
HCT: 35.9 % — ABNORMAL LOW (ref 36.0–46.0)
HCT: 35.6 % — ABNORMAL LOW (ref 36.0–46.0)
Hemoglobin: 11.4 g/dL — ABNORMAL LOW (ref 12.0–15.0)
Hemoglobin: 11.5 g/dL — ABNORMAL LOW (ref 12.0–15.0)
MCH: 30.2 pg (ref 26.0–34.0)
MCH: 29.9 pg (ref 26.0–34.0)
MCHC: 31.8 g/dL (ref 30.0–36.0)
MCHC: 32.3 g/dL (ref 30.0–36.0)
MCV: 95 fL (ref 80.0–100.0)
MCV: 92.5 fL (ref 80.0–100.0)
Platelets: 244 10*3/uL (ref 150–400)
Platelets: 229 10*3/uL (ref 150–400)
RBC: 3.78 MIL/uL — ABNORMAL LOW (ref 3.87–5.11)
RBC: 3.85 MIL/uL — ABNORMAL LOW (ref 3.87–5.11)
RDW: 14.6 % (ref 11.5–15.5)
RDW: 14.6 % (ref 11.5–15.5)
WBC: 12.2 10*3/uL — ABNORMAL HIGH (ref 4.0–10.5)
WBC: 10.9 10*3/uL — ABNORMAL HIGH (ref 4.0–10.5)
nRBC: 0 % (ref 0.0–0.2)
nRBC: 0 % (ref 0.0–0.2)

## 2021-07-26 LAB — TROPONIN I (HIGH SENSITIVITY)
Troponin I (High Sensitivity): 56 ng/L — ABNORMAL HIGH (ref <18)
Troponin I (High Sensitivity): 49 ng/L — ABNORMAL HIGH (ref <18)

## 2021-07-26 LAB — BASIC METABOLIC PANEL
Anion gap: 14 (ref 5–15)
BUN: 90 mg/dL — ABNORMAL HIGH (ref 6–20)
CO2: 23 mmol/L (ref 22–32)
Calcium: 9.2 mg/dL (ref 8.9–10.3)
Chloride: 99 mmol/L (ref 98–111)
Creatinine, Ser: 14.46 mg/dL — ABNORMAL HIGH (ref 0.44–1.00)
GFR, Estimated: 3 mL/min — ABNORMAL LOW (ref >60)
Glucose, Bld: 128 mg/dL — ABNORMAL HIGH (ref 70–99)
Potassium: 7.1 mmol/L (ref 3.5–5.1)
Sodium: 136 mmol/L (ref 135–145)

## 2021-07-26 LAB — HEMOGLOBIN A1C
Hgb A1c MFr Bld: 7.3 % — ABNORMAL HIGH (ref 4.8–5.6)
Mean Plasma Glucose: 162.81 mg/dL

## 2021-07-26 LAB — I-STAT VENOUS BLOOD GAS, ED
Acid-base deficit: 1 mmol/L (ref 0.0–2.0)
Bicarbonate: 24.3 mmol/L (ref 20.0–28.0)
Calcium, Ion: 1.14 mmol/L — ABNORMAL LOW (ref 1.15–1.40)
HCT: 35 % — ABNORMAL LOW (ref 36.0–46.0)
Hemoglobin: 11.9 g/dL — ABNORMAL LOW (ref 12.0–15.0)
O2 Saturation: 58 %
Potassium: 6.8 mmol/L (ref 3.5–5.1)
Sodium: 135 mmol/L (ref 135–145)
TCO2: 26 mmol/L (ref 22–32)
pCO2, Ven: 43.2 mmHg — ABNORMAL LOW (ref 44.0–60.0)
pH, Ven: 7.359 (ref 7.250–7.430)
pO2, Ven: 32 mmHg (ref 32.0–45.0)

## 2021-07-26 LAB — RESP PANEL BY RT-PCR (FLU A&B, COVID) ARPGX2
Influenza A by PCR: NEGATIVE
Influenza B by PCR: NEGATIVE
SARS Coronavirus 2 by RT PCR: NEGATIVE

## 2021-07-26 LAB — I-STAT CHEM 8, ED
BUN: 103 mg/dL — ABNORMAL HIGH (ref 6–20)
Calcium, Ion: 1.14 mmol/L — ABNORMAL LOW (ref 1.15–1.40)
Chloride: 104 mmol/L (ref 98–111)
Creatinine, Ser: 16.1 mg/dL — ABNORMAL HIGH (ref 0.44–1.00)
Glucose, Bld: 120 mg/dL — ABNORMAL HIGH (ref 70–99)
HCT: 34 % — ABNORMAL LOW (ref 36.0–46.0)
Hemoglobin: 11.6 g/dL — ABNORMAL LOW (ref 12.0–15.0)
Potassium: 6.8 mmol/L (ref 3.5–5.1)
Sodium: 135 mmol/L (ref 135–145)
TCO2: 25 mmol/L (ref 22–32)

## 2021-07-26 LAB — CBG MONITORING, ED
Glucose-Capillary: 127 mg/dL — ABNORMAL HIGH (ref 70–99)
Glucose-Capillary: 105 mg/dL — ABNORMAL HIGH (ref 70–99)

## 2021-07-26 LAB — BRAIN NATRIURETIC PEPTIDE: B Natriuretic Peptide: 366.9 pg/mL — ABNORMAL HIGH (ref 0.0–100.0)

## 2021-07-26 MED ORDER — DEXTROSE 50 % IV SOLN
1.0000 | Freq: Once | INTRAVENOUS | Status: AC
  Administered 2021-07-26: 50 mL via INTRAVENOUS
  Filled 2021-07-26: qty 50

## 2021-07-26 MED ORDER — CALCIUM GLUCONATE-NACL 1-0.675 GM/50ML-% IV SOLN
1.0000 g | Freq: Once | INTRAVENOUS | Status: AC
  Administered 2021-07-26: 1000 mg via INTRAVENOUS
  Filled 2021-07-26: qty 50

## 2021-07-26 MED ORDER — INSULIN ASPART 100 UNIT/ML IJ SOLN
0.0000 [IU] | INTRAMUSCULAR | Status: DC
  Administered 2021-07-27 (×2): 1 [IU] via SUBCUTANEOUS

## 2021-07-26 MED ORDER — CALCIUM GLUCONATE 10 % IV SOLN: 1.0000 g | Freq: Once | INTRAVENOUS | Status: DC

## 2021-07-26 MED ORDER — SODIUM ZIRCONIUM CYCLOSILICATE 10 G PO PACK
10.0000 g | PACK | Freq: Once | ORAL | Status: AC
  Administered 2021-07-26: 10 g via ORAL
  Filled 2021-07-26: qty 1

## 2021-07-26 MED ORDER — INSULIN ASPART 100 UNIT/ML IV SOLN
10.0000 [IU] | Freq: Once | INTRAVENOUS | Status: AC
  Administered 2021-07-26: 10 [IU] via INTRAVENOUS

## 2021-07-26 MED ORDER — CALCIUM GLUCONATE-NACL 1-0.675 GM/50ML-% IV SOLN: 1.0000 g | Freq: Once | INTRAVENOUS | Status: DC

## 2021-07-26 MED ORDER — POLYETHYLENE GLYCOL 3350 17 G PO PACK
17.0000 g | PACK | Freq: Every day | ORAL | Status: DC | PRN
  Filled 2021-07-26: qty 1

## 2021-07-26 MED ORDER — FUROSEMIDE 10 MG/ML IJ SOLN: 40.0000 mg | Freq: Once | INTRAMUSCULAR | Status: DC

## 2021-07-26 NOTE — H&P (Signed)
Pamela Campbell [id number] DOB: 05/10/1961 DOA: 07/26/2021     PCP: Kristen Loader, FNP   Outpatient Specialists:   CARDS:  Dr. Debara Pickett NEphrology:  [facility] NEurology   Dr.Igwemezie, Lucile Crater, MD      Patient arrived to ER on 07/26/21 at 1214 Referred by Attending Toy Baker, MD   Patient coming from: home Lives  With family    Chief Complaint:  SOB   HPI: Pamela Campbell is a 60 y.o. female with medical history significant of HTN, chronic diastolic CHF, ESRD on HD on T,H ,Sat. DM2, HTN,   Secondary hyperparahyroidism, OSA  Presented with   shortness of breath Had incomplete HD on Tuesday and Thursday did no have HD today presented to ER with worsening SOB Dialysis was suboptimal because of cannulation difficulties of her right brachiocephalic fistula. She had a fistulogram yesterday that showed it was patent but there was a tortuosity that may have contributed to the difficulty of cannulization.    Has  been vaccinated against COVID     Initial COVID TEST  NEGATIVE   Lab Results  Component Value Date   [place] NEGATIVE 07/26/2021   [place] NEGATIVE 12/09/2020     Regarding pertinent Chronic problems:     Hyperlipidemia - not on statins Lipid Panel     Component Value Date/Time   CHOL 323 (H) 10/24/2016 1822   TRIG 236 (H) 10/24/2016 1822   HDL 70 10/24/2016 1822   CHOLHDL 4.6 10/24/2016 1822   VLDL 47 (H) 10/24/2016 1822   LDLCALC 206 (H) 10/24/2016 1822     HTN on NOrvasc, labetalol    chronic CHF diastolic - last echo [id number] with preserved EF Grade 1 diastolic CHF On Lasix   CAD  - mild continue betablocker     DM 2 -  Lab Results  Component Value Date   HGBA1C 6.8 (H) 12/10/2020      Morbid obesity-   BMI Readings from Last 1 Encounters:  12/09/20 47.68 kg/m      ESRD on HD T,H,S  Lab Results  Component Value Date   CREATININE 16.10 (H) 07/26/2021   CREATININE 14.46 (H) 07/26/2021   CREATININE 9.09 (H)  12/11/2020     Chronic anemia - baseline hg Hemoglobin & Hematocrit  Recent Labs    07/26/21 1245 07/26/21 1342 07/26/21 1343  HGB 11.4* 11.6* 11.9*    While in ER: Noted to have hyperkalemia up to 7.1 was treated with Calcium, lokelma and insulin Nephrology was consulted plan to do HD tonight     ED Triage Vitals  Enc Vitals Group     BP 07/26/21 1241 (!) 196/93     Pulse Rate 07/26/21 1241 81     Resp 07/26/21 1241 18     Temp 07/26/21 1241 98.6 F (37 C)     Temp src --      SpO2 07/26/21 1241 97 %     Weight --      Height --      Head Circumference --      Peak Flow --      Pain Score 07/26/21 1240 0     Pain Loc --      Pain Edu? --      Excl. in [place]? --   TMAX(24)@     _________________________________________ Significant initial  Findings: Abnormal Labs Reviewed  BASIC METABOLIC PANEL - Abnormal; Notable for the following components:      Result Value  Potassium 7.1 (*)    Glucose, Bld 128 (*)    BUN 90 (*)    Creatinine, Ser 14.46 (*)    GFR, Estimated 3 (*)    All other components within normal limits  CBC - Abnormal; Notable for the following components:   WBC 12.2 (*)    RBC 3.78 (*)    Hemoglobin 11.4 (*)    HCT 35.9 (*)    All other components within normal limits  BRAIN NATRIURETIC PEPTIDE - Abnormal; Notable for the following components:   B Natriuretic Peptide 366.9 (*)    All other components within normal limits  I-STAT VENOUS BLOOD GAS, ED - Abnormal; Notable for the following components:   pCO2, Ven 43.2 (*)    Potassium 6.8 (*)    Calcium, Ion 1.14 (*)    HCT 35.0 (*)    Hemoglobin 11.9 (*)    All other components within normal limits  I-STAT CHEM 8, ED - Abnormal; Notable for the following components:   Potassium 6.8 (*)    BUN 103 (*)    Creatinine, Ser 16.10 (*)    Glucose, Bld 120 (*)    Calcium, Ion 1.14 (*)    Hemoglobin 11.6 (*)    HCT 34.0 (*)    All other components within normal limits  CBG MONITORING, ED - Abnormal;  Notable for the following components:   Glucose-Capillary 127 (*)    All other components within normal limits  CBG MONITORING, ED - Abnormal; Notable for the following components:   Glucose-Capillary 105 (*)    All other components within normal limits  TROPONIN I (HIGH SENSITIVITY) - Abnormal; Notable for the following components:   Troponin I (High Sensitivity) 56 (*)    All other components within normal limits   ____________________________________________ Ordered    CXR - Cardiomegaly and pulmonary vascular congestion.    _________________________ Troponin 56 - 49  ECG: Ordered Personally reviewed by me showing: HR : 73 Rhythm:  NSR,  Paced  no evidence of ischemic changes QTC 422   The recent clinical data is shown below. Vitals:   07/26/21 1745 07/26/21 1746 07/26/21 1749 07/26/21 1815  BP:      Pulse: 81 79 80 81  Resp: (!) 30 (!) 41 (!) 46 (!) 29  Temp:      SpO2: 95% 95% 95% 98%      WBC     Component Value Date/Time   WBC 12.2 (H) 07/26/2021 1245   LYMPHSABS 1.8 12/11/2020 0142   LYMPHSABS 3.5 (H) 11/14/2018 1107   MONOABS 1.3 (H) 12/11/2020 0142   EOSABS 0.0 12/11/2020 0142   EOSABS 0.3 11/14/2018 1107   BASOSABS 0.0 12/11/2020 0142   BASOSABS 0.1 11/14/2018 1107        Results for orders placed or performed during the hospital encounter of 07/26/21  Resp Panel by RT-PCR (Flu A&B, Covid) Nasopharyngeal Swab     Status: None   Collection Time: 07/26/21  3:32 PM   Specimen: Nasopharyngeal Swab; Nasopharyngeal(NP) swabs in vial transport medium  Result Value Ref Range Status   SARS Coronavirus 2 by RT PCR NEGATIVE NEGATIVE Final         Influenza A by PCR NEGATIVE NEGATIVE Final   Influenza B by PCR NEGATIVE NEGATIVE Final            _______________________________________________________ ER Provider Called:  Nephrology    Dr.Patel They Recommend admit to medicine    SEEN in ER _______________________________________________ Hospitalist  was  called for admission for hyperkalemia and fluid overload  The following Work up has been ordered so far:  Orders Placed This Encounter  Procedures   Critical Care   Resp Panel by RT-PCR (Flu A&B, Covid) Nasopharyngeal Swab   DG Chest 2 View   Basic metabolic panel   CBC   Brain natriuretic peptide   Renal function panel   Document Height and Actual Weight   Place Patient on a Cardiac Monitor   Initiate [place] Fluid Protocol   Pre-Hemodialysis Protocol - Day of Dialysis   Post-Dialysis Protocol - Day of Dialysis   Cardiac monitoring   Consult to nephrology Consult Timeframe: STAT - requires a response within one hour; STAT timeframe requires provider to provider communication, has the provider to provider communication been completed: Yes; Reason for Consult? Hyperkalemia, K 6....   Consult to nephrology   Consult for [facility] Admission   Airborne and Contact precautions   I-Stat venous blood gas, [facility] ED)   I-stat chem 8, ED (not at [facility] or [place])   CBG monitoring, ED   CBG monitoring, ED   I-stat chem 8, ED (not at [facility] or [facility])   ED EKG   ED EKG   EKG 12-Lead   Hemodialysis inpatient   Place in observation (patient's expected length of stay will be less than 2 midnights)     Following Medications were ordered in ER: Medications  calcium gluconate 1 g/ 50 mL sodium chloride IVPB (has no administration in time range)  sodium zirconium cyclosilicate (LOKELMA) packet 10 g (10 g Oral Given 07/26/21 1424)  insulin aspart (novoLOG) injection 10 Units (10 Units Intravenous Given 07/26/21 1424)    And  dextrose 50 % solution 50 mL (50 mLs Intravenous Given 07/26/21 1424)  calcium gluconate 1 g/ 50 mL sodium chloride IVPB (0 g Intravenous Stopped 07/26/21 1532)  calcium gluconate 1 g/ 50 mL sodium chloride IVPB (0 g Intravenous Stopped 07/26/21 1758)     OTHER Significant initial  Findings:  labs showing:    Recent Labs  Lab 07/26/21 1245 07/26/21 1342 07/26/21 1343   NA 136 135 135  K 7.1* 6.8* 6.8*  CO2 23  --   --   GLUCOSE 128* 120*  --   BUN 90* 103*  --   CREATININE 14.46* 16.10*  --   CALCIUM 9.2  --   --      Cr    Up from baseline see below Lab Results  Component Value Date   CREATININE 16.10 (H) 07/26/2021   CREATININE 14.46 (H) 07/26/2021   CREATININE 9.09 (H) 12/11/2020    No results for input(s): AST, ALT, ALKPHOS, BILITOT, PROT, ALBUMIN in the last 168 hours. Lab Results  Component Value Date   CALCIUM 9.2 07/26/2021   PHOS 8.2 (H) 12/11/2020          Plt: Lab Results  Component Value Date   PLT 244 07/26/2021     Recent Labs  Lab 07/26/21 1245 07/26/21 1342 07/26/21 1343  WBC 12.2*  --   --   HGB 11.4* 11.6* 11.9*  HCT 35.9* 34.0* 35.0*  MCV 95.0  --   --   PLT 244  --   --     HG/HCT  stable,      Component Value Date/Time   HGB 11.9 (L) 07/26/2021 1343   HGB 12.0 11/14/2018 1107   HCT 35.0 (L) 07/26/2021 1343   HCT 36.1 11/14/2018 1107   MCV 95.0 07/26/2021 1245  MCV 86 11/14/2018 1107      Cardiac Panel (last 3 results) No results for input(s): CKTOTAL, CKMB, TROPONINI, RELINDX in the last 72 hours.   BNP (last 3 results) Recent Labs    12/09/20 2129 07/26/21 1325  BNP 855.0* 366.9*     DM  labs:  HbA1C: Recent Labs    12/10/20 0148  HGBA1C 6.8*       CBG (last 3)  Recent Labs    07/26/21 1417 07/26/21 1531  GLUCAP 127* 105*      Cultures:    Component Value Date/Time   SDES BLOOD LEFT WRIST 12/10/2020 1220   SPECREQUEST  12/10/2020 1220    BOTTLES DRAWN AEROBIC AND ANAEROBIC Blood Culture adequate volume   CULT  12/10/2020 1220    NO GROWTH 5 DAYS Performed at [facility], [place] [address] [place], [place] [address]    REPTSTATUS 12/15/2020 FINAL 12/10/2020 1220     Radiological Exams on Admission: DG Chest 2 View  Result Date: 07/26/2021 CLINICAL DATA:  Shortness of breath. EXAM: CHEST - 2 VIEW COMPARISON:  12/11/2020 FINDINGS: Heart is enlarged and stable  in configuration. There is perihilar vascular prominence. No focal consolidation or evidence for pleural effusion. No overt edema. IMPRESSION: Cardiomegaly and pulmonary vascular congestion. No evidence for acute focal pulmonary abnormality. Electronically Signed   By: Nolon Nations M.D.   On: 07/26/2021 13:18   _______________________________________________________________________________________________________ Latest  Blood pressure (!) 177/79, pulse 81, temperature 98.6 F (37 C), resp. rate (!) 29, last menstrual period 02/02/2015, SpO2 98 %.   Review of Systems:    Pertinent positives include:   shortness of breath at rest.  dyspnea on exertion,   Constitutional:  No weight loss, night sweats, Fevers, chills, fatigue, weight loss  HEENT:  No headaches, Difficulty swallowing,Tooth/dental problems,Sore throat,  No sneezing, itching, ear ache, nasal congestion, post nasal drip,  Cardio-vascular:  No chest pain, Orthopnea, PND, anasarca, dizziness, palpitations.no Bilateral lower extremity swelling  GI:  No heartburn, indigestion, abdominal pain, nausea, vomiting, diarrhea, change in bowel habits, loss of appetite, melena, blood in stool, hematemesis Resp:  no No excess mucus, no productive cough, No non-productive cough, No coughing up of blood.No change in color of mucus.No wheezing. Skin:  no rash or lesions. No jaundice GU:  no dysuria, change in color of urine, no urgency or frequency. No straining to urinate.  No flank pain.  Musculoskeletal:  No joint pain or no joint swelling. No decreased range of motion. No back pain.  Psych:  No change in mood or affect. No depression or anxiety. No memory loss.  Neuro: no localizing neurological complaints, no tingling, no weakness, no double vision, no gait abnormality, no slurred speech, no confusion  All systems reviewed and apart from [place] all are  negative _______________________________________________________________________________________________ Past Medical History:   Past Medical History:  Diagnosis Date   Anemia    Chronic diastolic CHF (congestive heart failure) ([place])    a. 03/2015: echo w/ EF of 50-55%, no WMA, Grade 2 DD, trivial AI, mild MR.   Chronic kidney disease (CKD), stage V ([place])    dialysis tuesday, thursday saterday   Constipation    Diabetes mellitus without complication ([place])    CONTROLLED WITH DIET   Dyspnea    non lately   Edema, lower extremity    Hypertension    Overweight    Pneumonia 2016   WHILE HOSPIATLIZED   Shortness of breath      Past  Surgical History:  Procedure Laterality Date   AV FISTULA PLACEMENT Right 01/04/2018   Procedure: ARTERIOVENOUS (AV) FISTULA CREATION RIGHT ARM;  Surgeon: Conrad Aitkin, MD;  Location: [facility];  Service: Vascular;  Laterality: Right;   AV FISTULA PLACEMENT Right 01/24/2018   Procedure: ARTERIOVENOUS (AV) FISTULA CREATION RIGHT BRACHIOCEPHALIC;  Surgeon: Conrad [place], MD;  Location: [place];  Service: Vascular;  Laterality: Right;   CARDIAC CATHETERIZATION N/A 11/02/2016   Procedure: Left Heart Cath and Coronary Angiography;  Surgeon: Sherren Mocha, MD;  Location: [place] [facility];  Service: Cardiovascular;  Laterality: N/A;   CESAREAN SECTION     COLONOSCOPY     DILATATION & CURETTAGE/HYSTEROSCOPY WITH MYOSURE N/A 09/02/2018   Procedure: DILATATION & CURETTAGE/HYSTEROSCOPY WITH MYOSURE;  Surgeon: Christophe Louis, MD;  Location: [facility];  Service: Gynecology;  Laterality: N/A;   FISTULA PLUG     FISTULA SUPERFICIALIZATION Right 05/02/2018   Procedure: FISTULA SUPERFICIALIZATION RIGHT ARTERIOVENOUS FISTULA;  Surgeon: Conrad [place], MD;  Location: [facility];  Service: Vascular;  Laterality: Right;   FRACTURE SURGERY     shoulder left   INSERTION OF DIALYSIS CATHETER Right 01/04/2018   Procedure: INSERTION OF DIALYSIS CATHETER RIGHT INTERNAL JUGULAR;  Surgeon: Conrad [place], MD;  Location: [facility];  Service: Vascular;  Laterality: Right;   IR FLUORO GUIDE CV LINE RIGHT  12/31/2017   IR US GUIDE VASC ACCESS RIGHT  12/31/2017   left shoulder rotator     LIGATION OF ARTERIOVENOUS  FISTULA Right 01/24/2018   Procedure: LIGATION OF  RIGHT RADIOCEPHALIC ARTERIOVENOUS  FISTULA;  Surgeon: Conrad [place], MD;  Location: [facility];  Service: Vascular;  Laterality: Right;   TONSILECTOMY, ADENOIDECTOMY, BILATERAL MYRINGOTOMY AND TUBES     TOOTH EXTRACTION     TUBAL LIGATION      Social History:  Ambulatory   independently       reports that she quit smoking about 23 years ago. She has never used smokeless tobacco. She reports that she does not drink alcohol and does not use drugs.    Family History:   Family History  Problem Relation Age of Onset   Diabetes Mellitus II Mother    Hypertension Mother    Hyperlipidemia Mother    Kidney disease Mother    Eating disorder Mother    Obesity Mother    Heart disease Father    Heart attack Father 43   Congestive Heart Failure Brother    Congestive Heart Failure Son     ______________________________________________________________________________________________ Allergies: Allergies  Allergen Reactions   Compazine [Prochlorperazine Edisylate] Swelling and Other (See Comments)    "The tongue hardens and comes out of mouth" ([place] dyskinesia) Also made the neck muscles twist.   Nsaids Other (See Comments)    WAS TOLD TO NOT TAKE THESE DUE TO KIDNEY ISSUES   Sensipar [Cinacalcet] Nausea And Vomiting    Causes vomiting   Vioxx [Rofecoxib] Other (See Comments)    Caused internal bleeding   Other Other (See Comments)    Unnamed "binder" caused EXTREME constipation     Prior to Admission medications   Medication Sig Start Date End Date Taking? Authorizing Provider  amLODipine (NORVASC) 5 MG tablet Take 5 mg by mouth daily in the afternoon. 10/09/20  Yes [provider]  Calcium Acetate 667 MG TABS Take 667 mg  by mouth at bedtime. 09/11/20  Yes [provider]  cinacalcet (SENSIPAR) 30 MG tablet Take 30 mg by mouth every evening. 11/22/20  Yes [provider]  CONSTULOSE 10 GM/15ML solution Take 30 g by mouth daily.   Yes [provider]  furosemide (LASIX) 40 MG tablet Take 40 mg by mouth daily as needed for fluid. 11/11/20  Yes [provider]  labetalol (NORMODYNE) 100 MG tablet Take 100 mg by mouth daily in the afternoon.   Yes [provider]  lanthanum (FOSRENOL) 1000 MG chewable tablet Chew 1,000 mg by mouth 3 (three) times daily with meals.   Yes [provider]  ondansetron (ZOFRAN-ODT) 4 MG disintegrating tablet Take 4 mg by mouth every 6 (six) hours as needed for nausea or vomiting (DISSOLVE ORALLY). 07/05/18  Yes [provider]  TRULANCE 3 MG TABS Take 3 mg by mouth daily as needed (when Constulance is). 09/15/20  Yes [provider]  calcium carbonate (OS-CAL) 1250 (500 Ca) MG chewable tablet Chew 1 tablet by mouth daily.  08/01/20  [provider]    ___________________________________________________________________________________________________ Physical Exam: Vitals with BMI 07/26/2021 07/26/2021 07/26/2021  Height - - -  Weight - - -  BMI - - -  Systolic - - -  Diastolic - - -  Pulse 81 80 79     1. General:  in No  Acute distress   Chronically ill -appearing 2. Psychological: Alert and  Oriented 3. Head/ENT:   Moist  Mucous Membranes                          Head Non traumatic, neck supple                           Poor Dentition 4. SKIN: normal  Skin turgor,  Skin clean Dry and intact no rash 5. Heart: Regular rate and rhythm no Murmur, no Rub or gallop 6. Lungs:   no wheezes or crackles   7. Abdomen: Soft,  non-tender, Non distended   obese  bowel sounds present 8. Lower extremities: no clubbing, cyanosis,  edema 9. Neurologically Grossly intact, moving all 4 extremities equally   10. MSK: Normal  range of motion    Chart has been reviewed  ______________________________________________________________________________________________  Assessment/Plan 60 y.o. female with medical history significant of HTN, chronic diastolic CHF, ESRD on HD on T,H ,Sat. DM2, HTN,   Secondary hyperparahyroidism, OSA   Admitted for  acute onc chronic diastolic CHF and hyperkalemia  Present on Admission:  Hyperkalemia secondary to not finishing hemodialysis twice.  Nephrology aware will treat with hemodialysis tonight. Repeat electrolytes after HD  Uncontrolled hypertension -secondary to not completing hemodialysis.  We will treat you hemodialysis restart home medications when able to tolerate.     Obesity BMI 45 chronic stable will need outpatient follow-up with nutrition   Minor CAD - restart BB   HLD (hyperlipidemia)- not on statin     Diabetes mellitus type 2 in obese (HCC) -  - Order Sensitive SSI     -  check TSH and HgA1C     Acute on chronic diastolic heart failure ([place]) - in the setting of fluid overload due to shortened HD   Elevated troponin-demand ischemia - Patient currently denies any chest pain  Probably demand ischemia in the setting of fluid overload and poor clearance  - EKG without evidence of acute ischemia  - Admitted to telemetry   -   cardiac enzymes stable  - obtain echogram in AM    Other plan as per orders.  DVT prophylaxis:  HEp [place]    Code Status:    Code Status: Prior FULL CODE  as per patient   I had personally discussed CODE STATUS with patient     Family Communication:   Family not at  Bedside    Disposition Plan:       To home once workup is complete and patient is stable   Following barriers for discharge:                            Electrolytes corrected                                                Will need consultants to evaluate patient prior to discharge    Would benefit from PT/OT eval prior to [place] called: nephrology  is aware  Admission status:  ED Disposition     ED Disposition  [place]: [place] [[id number]]  Level of Care: Progressive [102]  Admit to Progressive based on following criteria: CARDIOVASCULAR & THORACIC of moderate stability with acute coronary syndrome symptoms/low risk myocardial infarction/hypertensive urgency/arrhythmias/heart failure potentially compromising stability and stable post cardiovascular intervention patients.  May place patient in observation at [facility] or [facility] if equivalent level of care is available:: No  Covid Evaluation: Confirmed COVID Negative  Diagnosis: Hyperkalemia [id number]  Admitting Physician: Toy Baker [[id number]]  Attending Physician: Toy Baker [[id number]]           Obs      Level of care progressive  tele indefinitely please discontinue once patient no longer qualifies COVID-19 Labs    Lab Results  Component Value Date   [place] 07/26/2021     Precautions: admitted as   Covid Negative     PPE: Used by the provider:   N95  eye Goggles,      Toy Baker 07/26/2021, 10:47 PM    [facility]     after 2 AM please page floor coverage PA If 7AM-7PM, please contact the day team taking care of the patient using [email or web address]   Patient was evaluated in the context of the global COVID-19 pandemic, which necessitated consideration that the patient might be at risk for infection with the SARS-CoV-2 virus that causes COVID-19. Institutional protocols and algorithms that pertain to the evaluation of patients at risk for COVID-19 are in a state of rapid change based on information released by regulatory bodies including the CDC and federal and state organizations. These policies and algorithms were followed during the patient's care.

## 2021-07-26 NOTE — ED Triage Notes (Signed)
Patient arrived by [facility] for increased SOB x 1 week. Reported dialysis graft has been causing problems this week and had 2 hours of dialsysis on Tuesday and 1.5 hour on Thursday. Had graft checked yesterday and no problems noted, was scheduled for dialysis today but didn't go due to her SOB. Patient alert and oriented. Denis CP. NAD

## 2021-07-26 NOTE — ED Notes (Signed)
Dr Armandina Gemma made aware of Istat VBG and Chem8 lab results. ED-lab

## 2021-07-26 NOTE — ED Provider Notes (Addendum)
[facility] EMERGENCY DEPARTMENT Provider Note   CSN: [id number] Arrival date & time: 07/26/21  1214     History No chief complaint on file.   Pamela Campbell is a 60 y.o. female.  HPI  60 year old female with a history of CHF (chronic diastolic dysfunction), CKD stage V on dialysis Tuesday, Thursday, Saturday, DM 2 controlled with diet, obesity, hypertension presented to the Emergency Department with worsening shortness of breath in the setting of missed dialysis.  The patient states that Pamela Campbell had difficulty with dialysis earlier this week.  On Tuesday her dialysis was only undertaken for 2 hours before there were problems with her fistula and on Thursday Pamela Campbell only had 1.5 hours of dialysis.  Was checked yesterday with no bleeding noted.  Pamela Campbell was scheduled for dialysis today but endorsed worsening shortness of breath and was advised to present to the emergency department.  Pamela Campbell denies any chest pain or heart palpitations.  Pamela Campbell endorses worsening abdominal and lower extremity edema in addition to worsening shortness of breath.  Past Medical History:  Diagnosis Date   Anemia    Chronic diastolic CHF (congestive heart failure) ([place])    a. 03/2015: echo w/ EF of 50-55%, no WMA, Grade 2 DD, trivial AI, mild MR.   Chronic kidney disease (CKD), stage V ([place])    dialysis tuesday, thursday saterday   Constipation    Diabetes mellitus without complication ([place])    CONTROLLED WITH DIET   Dyspnea    non lately   Edema, lower extremity    Hypertension    Overweight    Pneumonia 2016   WHILE HOSPIATLIZED   Shortness of breath     Patient Active Problem List   Diagnosis Date Noted   Hyperkalemia 12/10/2020   SIRS (systemic inflammatory response syndrome) ([place]) 12/10/2020   Vitamin D deficiency 02/13/2019   Postmenopausal bleeding 09/02/2018   Acute on chronic diastolic CHF (congestive heart failure) ([place]) 12/31/2017   Acute viral bronchitis    Acute on chronic diastolic  heart failure ([place]) 12/30/2017   Cough in adult 12/30/2017   Uncontrolled hypertension 12/30/2017   Diabetes mellitus type 2 in obese ([place]) [id number]   Metabolic acidosis [id number]   Hypertensive urgency 10/11/2017   Hepatitis C antibody test positive 12/08/2016   Minor CAD     Abnormal stress test    Essential hypertension    ESRD on dialysis [facility])    Unstable angina pectoris ([place])    Chest pain with high risk for cardiac etiology 10/24/2016   HLD (hyperlipidemia) 07/17/2015   Type 2 diabetes mellitus ([place]) 03/27/2015   Chronic diastolic congestive heart failure ([place]) 03/25/2015   Accelerated hypertension 03/25/2015   Obesity BMI 45 03/25/2015   Sleep apnea 03/25/2015   Dyslipidemia 03/25/2015   Elevated troponin-demand ischemia 03/24/2015   Hypertensive heart disease 03/24/2015   Shortness of breath 03/24/2015    Past Surgical History:  Procedure Laterality Date   AV FISTULA PLACEMENT Right 01/04/2018   Procedure: ARTERIOVENOUS (AV) FISTULA CREATION RIGHT ARM;  Surgeon: Conrad Hebron, MD;  Location: [facility];  Service: Vascular;  Laterality: Right;   AV FISTULA PLACEMENT Right 01/24/2018   Procedure: ARTERIOVENOUS (AV) FISTULA CREATION RIGHT BRACHIOCEPHALIC;  Surgeon: Conrad [place], MD;  Location: [facility];  Service: Vascular;  Laterality: Right;   CARDIAC CATHETERIZATION N/A 11/02/2016   Procedure: Left Heart Cath and Coronary Angiography;  Surgeon: Sherren Mocha, MD;  Location: [facility] CV LAB;  Service: Cardiovascular;  Laterality: N/A;  CESAREAN SECTION     COLONOSCOPY     DILATATION & CURETTAGE/HYSTEROSCOPY WITH MYOSURE N/A 09/02/2018   Procedure: DILATATION & CURETTAGE/HYSTEROSCOPY WITH MYOSURE;  Surgeon: Christophe Louis, MD;  Location: [facility];  Service: Gynecology;  Laterality: N/A;   FISTULA PLUG     FISTULA SUPERFICIALIZATION Right 05/02/2018   Procedure: FISTULA SUPERFICIALIZATION RIGHT ARTERIOVENOUS FISTULA;  Surgeon: Conrad Elcho, MD;  Location[facility];  Service: Vascular;   Laterality: Right;   FRACTURE SURGERY     shoulder left   INSERTION OF DIALYSIS CATHETER Right 01/04/2018   Procedure: INSERTION OF DIALYSIS CATHETER RIGHT INTERNAL JUGULAR;  Surgeon: Conrad Lihue, MD;  Location: [facility];  Service: Vascular;  Laterality: Right;   IR FLUORO GUIDE CV LINE RIGHT  12/31/2017   IR US GUIDE VASC ACCESS RIGHT  12/31/2017   left shoulder rotator     LIGATION OF ARTERIOVENOUS  FISTULA Right 01/24/2018   Procedure: LIGATION OF  RIGHT RADIOCEPHALIC ARTERIOVENOUS  FISTULA;  Surgeon: Conrad Gackle, MD;  Location: [facility];  Service: Vascular;  Laterality: Right;   TONSILECTOMY, ADENOIDECTOMY, BILATERAL MYRINGOTOMY AND TUBES     TOOTH EXTRACTION     TUBAL LIGATION       OB History     Gravida  3   Para  3   Term      Preterm      AB      Living         SAB      IAB      Ectopic      Multiple      Live Births              Family History  Problem Relation Age of Onset   Diabetes Mellitus II Mother    Hypertension Mother    Hyperlipidemia Mother    Kidney disease Mother    Eating disorder Mother    Obesity Mother    Heart disease Father    Heart attack Father 29   Congestive Heart Failure Brother    Congestive Heart Failure Son     Social History   Tobacco Use   Smoking status: Former    Types: Cigarettes    Quit date: 03/21/1998    Years since quitting: 23.3   Smokeless tobacco: Never  Vaping Use   Vaping Use: Never used  Substance Use Topics   Alcohol use: No   Drug use: No    Home Medications Prior to Admission medications   Medication Sig Start Date End Date Taking? Authorizing Provider  amLODipine (NORVASC) 5 MG tablet Take 5 mg by mouth daily in the afternoon. 10/09/20  Yes [provider]  Calcium Acetate 667 MG TABS Take 667 mg by mouth at bedtime. 09/11/20  Yes [provider]  cinacalcet (SENSIPAR) 30 MG tablet Take 30 mg by mouth every evening. 11/22/20  Yes [provider]  CONSTULOSE 10  GM/15ML solution Take 30 g by mouth daily.   Yes [provider]  furosemide (LASIX) 40 MG tablet Take 40 mg by mouth daily as needed for fluid. 11/11/20  Yes [provider]  labetalol (NORMODYNE) 100 MG tablet Take 100 mg by mouth daily in the afternoon.   Yes [provider]  lanthanum (FOSRENOL) 1000 MG chewable tablet Chew 1,000 mg by mouth 3 (three) times daily with meals.   Yes [provider]  ondansetron (ZOFRAN-ODT) 4 MG disintegrating tablet Take 4 mg by mouth every 6 (six)  hours as needed for nausea or vomiting (DISSOLVE ORALLY). 07/05/18  Yes [provider]  TRULANCE 3 MG TABS Take 3 mg by mouth daily as needed (when Constulance is). 09/15/20  Yes [provider]  calcium carbonate (OS-CAL) 1250 (500 Ca) MG chewable tablet Chew 1 tablet by mouth daily.  08/01/20  [provider]    Allergies    Compazine [prochlorperazine edisylate], Nsaids, Sensipar [cinacalcet], Vioxx [rofecoxib], and Other  Review of Systems   Review of Systems  Constitutional:  Negative for chills and fever.  HENT:  Negative for ear pain and sore throat.   Eyes:  Negative for pain and visual disturbance.  Respiratory:  Positive for shortness of breath. Negative for cough.   Cardiovascular:  Positive for leg swelling. Negative for chest pain and palpitations.  Gastrointestinal:  Positive for abdominal distention. Negative for abdominal pain and vomiting.  Genitourinary:  Negative for dysuria and hematuria.  Musculoskeletal:  Negative for arthralgias and back pain.  Skin:  Negative for color change and rash.  Neurological:  Negative for seizures and syncope.  All other systems reviewed and are negative.  Physical Exam Updated Vital Signs BP (!) 177/79   Pulse 81   Temp 98.6 F (37 C)   Resp (!) 29   LMP 02/02/2015   SpO2 98%   Physical Exam Vitals and nursing note reviewed.  Constitutional:      General: Pamela Campbell is not in acute distress.     Appearance: Pamela Campbell is well-developed.  HENT:     Head: Normocephalic and atraumatic.  Eyes:     Conjunctiva/sclera: Conjunctivae normal.  Cardiovascular:     Rate and Rhythm: Normal rate and regular rhythm.     Heart sounds: No murmur heard. Pulmonary:     Effort: Pulmonary effort is normal. Tachypnea present. No respiratory distress.     Breath sounds: Rhonchi and rales present.  Abdominal:     General: There is distension.     Palpations: Abdomen is soft.     Tenderness: There is no abdominal tenderness.  Musculoskeletal:     Cervical back: Neck supple.     Right lower leg: Edema present.     Left lower leg: Edema present.     Comments: RUE AV fistula in place, palpable thrill  Skin:    General: Skin is warm and dry.  Neurological:     General: No focal deficit present.     Mental Status: Pamela Campbell is alert and oriented to person, place, and time. Mental status is at baseline.    ED Results / Procedures / Treatments   Labs (all labs ordered are listed, but only abnormal results are displayed) Labs Reviewed  BASIC METABOLIC PANEL - Abnormal; Notable for the following components:      Result Value   Potassium 7.1 (*)    Glucose, Bld 128 (*)    BUN 90 (*)    Creatinine, Ser 14.46 (*)    GFR, Estimated 3 (*)    All other components within normal limits  CBC - Abnormal; Notable for the following components:   WBC 12.2 (*)    RBC 3.78 (*)    Hemoglobin 11.4 (*)    HCT 35.9 (*)    All other components within normal limits  BRAIN NATRIURETIC PEPTIDE - Abnormal; Notable for the following components:   B Natriuretic Peptide 366.9 (*)    All other components within normal limits  I-STAT VENOUS BLOOD GAS, ED - Abnormal; Notable for the following components:  pCO2, Ven 43.2 (*)    Potassium 6.8 (*)    Calcium, Ion 1.14 (*)    HCT 35.0 (*)    Hemoglobin 11.9 (*)    All other components within normal limits  I-STAT CHEM 8, ED - Abnormal; Notable for the following components:    Potassium 6.8 (*)    BUN 103 (*)    Creatinine, Ser 16.10 (*)    Glucose, Bld 120 (*)    Calcium, Ion 1.14 (*)    Hemoglobin 11.6 (*)    HCT 34.0 (*)    All other components within normal limits  CBG MONITORING, ED - Abnormal; Notable for the following components:   Glucose-Capillary 127 (*)    All other components within normal limits  CBG MONITORING, ED - Abnormal; Notable for the following components:   Glucose-Capillary 105 (*)    All other components within normal limits  TROPONIN I (HIGH SENSITIVITY) - Abnormal; Notable for the following components:   Troponin I (High Sensitivity) 56 (*)    All other components within normal limits  TROPONIN I (HIGH SENSITIVITY) - Abnormal; Notable for the following components:   Troponin I (High Sensitivity) 49 (*)    All other components within normal limits  RESP PANEL BY RT-PCR (FLU A&B, COVID) [id number]  RENAL FUNCTION PANEL  I-STAT CHEM 8, ED    EKG EKG Interpretation  Date/Time:  Saturday July 26 2021 14:42:04 EDT Ventricular Rate:  72 PR Interval:  155 QRS Duration: 99 QT Interval:  395 QTC Calculation: 433 R Axis:   -13 Text Interpretation: Sinus rhythm Anterior infarct, old peaked T-waves present Confirmed by Regan Lemming ([id number]) on 07/26/2021 4:41:15 PM  Radiology DG Chest 2 View  Result Date: 07/26/2021 CLINICAL DATA:  Shortness of breath. EXAM: CHEST - 2 VIEW COMPARISON:  12/11/2020 FINDINGS: Heart is enlarged and stable in configuration. There is perihilar vascular prominence. No focal consolidation or evidence for pleural effusion. No overt edema. IMPRESSION: Cardiomegaly and pulmonary vascular congestion. No evidence for acute focal pulmonary abnormality. Electronically Signed   By: Nolon Nations M.D.   On: 07/26/2021 13:18    Procedures .Critical Care  Date/Time: 07/26/2021 1:57 PM Performed by: Regan Lemming, MD Authorized by: Regan Lemming, MD   Critical care provider statement:    Critical care time  (minutes):  90   Critical care was necessary to treat or prevent imminent or life-threatening deterioration of the following conditions:  Renal failure and metabolic crisis (Hyperkalemia)   Critical care was time spent personally by me on the following activities:  Discussions with consultants, evaluation of patient's response to treatment, examination of patient, ordering and performing treatments and interventions, ordering and review of laboratory studies, ordering and review of radiographic studies, pulse oximetry, re-evaluation of patient's condition, obtaining history from patient or surrogate and review of old charts   Medications Ordered in ED Medications  calcium gluconate 1 g/ 50 mL sodium chloride IVPB (has no administration in time range)  sodium zirconium cyclosilicate (LOKELMA) packet 10 g (10 g Oral Given 07/26/21 1424)  insulin aspart (novoLOG) injection 10 Units (10 Units Intravenous Given 07/26/21 1424)    And  dextrose 50 % solution 50 mL (50 mLs Intravenous Given 07/26/21 1424)  calcium gluconate 1 g/ 50 mL sodium chloride IVPB (0 g Intravenous Stopped 07/26/21 1532)  calcium gluconate 1 g/ 50 mL sodium chloride IVPB (0 g Intravenous Stopped 07/26/21 1758)    ED Course  I have reviewed the triage vital signs and the  nursing notes.  Pertinent labs & imaging results that were available during my care of the patient were reviewed by me and considered in my medical decision making (see chart for details).    MDM Rules/Calculators/A&P                           60 year old female with a history of CHF (chronic diastolic dysfunction), CKD stage V on dialysis Tuesday, Thursday, Saturday, DM 2 controlled with diet, obesity, hypertension presented to the Emergency Department with worsening shortness of breath in the setting of missed dialysis.  The patient states that Pamela Campbell had difficulty with dialysis earlier this week.  On Tuesday her dialysis was only undertaken for 2 hours before there  were problems with her fistula and on Thursday Pamela Campbell only had 1.5 hours of dialysis.  On arrival, the patient was found to be hyperkalemic on i-STAT Chem-8 with a K of 6.8.  Pamela Campbell had an EKG that appeared to show peaked T waves.  Pamela Campbell was administered  calcium gluconate for cardiac membrane stabilization, insulin/dextrose, Lokelma.  Nephrology was consulted for emergent dialysis.  Patient's potassium resulted elevated to 7.1 on her CMP.  Pamela Campbell was temporized in the emergency department and nephrology plans for emergent dialysis.  Medicine was consulted for admission.  The patient remained tachypneic but did not require BiPAP while in the emergency department.  Following here in the emergency department, the patient was subsequently admitted to the medicine service and prior to admission the patient was transported off the floor for emergent hemodialysis.  Final Clinical Impression(s) / ED Diagnoses Final diagnoses:  Hyperkalemia  Other hypervolemia  Hypertension, unspecified type    Rx / DC Orders ED Discharge Orders     None        Regan Lemming, MD 07/26/21 Remus Loffler    Regan Lemming, MD 07/26/21 [phone]

## 2021-07-26 NOTE — ED Notes (Signed)
Dr. Armandina Gemma aware of K+ level

## 2021-07-26 NOTE — Consult Note (Signed)
Reason for Consult: Hyperkalemia, volume overload and end-stage renal disease Referring Physician: Regan Lemming MD (EDP)  HPI:  60 year old African-American woman with past medical history significant for hypertension, diabetes mellitus, chronic diastolic heart failure, obesity and end-stage renal disease on hemodialysis on a TTS schedule ([facility], [place]).  She presented today to the emergency room with increasing shortness of breath and nausea after incomplete hemodialysis treatments on Tuesday and Thursday of this week due to problems with her right upper arm dialysis fistula.  She had a normal fistulogram study done yesterday at [facility].  She denies any fevers or chills and does not have any cough or sputum production.  She denies any vomiting or diarrhea and has not had any dysuria, urgency, frequency or hematuria.  She recalls that her estimated dry weight is 121.5 kg.   Past Medical History:  Diagnosis Date   Anemia    Chronic diastolic CHF (congestive heart failure) ([place])    a. 03/2015: echo w/ EF of 50-55%, no WMA, Grade 2 DD, trivial AI, mild MR.   Chronic kidney disease (CKD), stage V ([place])    dialysis tuesday, thursday saterday   Constipation    Diabetes mellitus without complication ([place])    CONTROLLED WITH DIET   Dyspnea    non lately   Edema, lower extremity    Hypertension    Overweight    Pneumonia 2016   WHILE HOSPIATLIZED   Shortness of breath     Past Surgical History:  Procedure Laterality Date   AV FISTULA PLACEMENT Right 01/04/2018   Procedure: ARTERIOVENOUS (AV) FISTULA CREATION RIGHT ARM;  Surgeon: Conrad Gamewell, MD;  Location: [facility] [place];  Service: Vascular;  Laterality: Right;   AV FISTULA PLACEMENT Right 01/24/2018   Procedure: ARTERIOVENOUS (AV) FISTULA CREATION RIGHT BRACHIOCEPHALIC;  Surgeon: Conrad [place], MD;  Location: [facility];  Service: Vascular;  Laterality: Right;   CARDIAC CATHETERIZATION N/A 11/02/2016    Procedure: Left Heart Cath and Coronary Angiography;  Surgeon: Sherren Mocha, MD;  Location: [facility];  Service: Cardiovascular;  Laterality: N/A;   CESAREAN SECTION     COLONOSCOPY     DILATATION & CURETTAGE/HYSTEROSCOPY WITH MYOSURE N/A 09/02/2018   Procedure: DILATATION & CURETTAGE/HYSTEROSCOPY WITH MYOSURE;  Surgeon: Christophe Louis, MD;  Location: [facility];  Service: Gynecology;  Laterality: N/A;   FISTULA PLUG     FISTULA SUPERFICIALIZATION Right 05/02/2018   Procedure: FISTULA SUPERFICIALIZATION RIGHT ARTERIOVENOUS FISTULA;  Surgeon: Conrad Waldo, MD;  Location: [facility];  Service: Vascular;  Laterality: Right;   FRACTURE SURGERY     shoulder left   INSERTION OF DIALYSIS CATHETER Right 01/04/2018   Procedure: INSERTION OF DIALYSIS CATHETER RIGHT INTERNAL JUGULAR;  Surgeon: Conrad [place], MD;  Location: [facility];  Service: Vascular;  Laterality: Right;   IR FLUORO GUIDE CV LINE RIGHT  12/31/2017   IR US GUIDE VASC ACCESS RIGHT  12/31/2017   left shoulder rotator     LIGATION OF ARTERIOVENOUS  FISTULA Right 01/24/2018   Procedure: LIGATION OF  RIGHT RADIOCEPHALIC ARTERIOVENOUS  FISTULA;  Surgeon: Conrad , MD;  Location: [facility];  Service: Vascular;  Laterality: Right;   TONSILECTOMY, ADENOIDECTOMY, BILATERAL MYRINGOTOMY AND TUBES     TOOTH EXTRACTION     TUBAL LIGATION      Family History  Problem Relation Age of Onset   Diabetes Mellitus II Mother    Hypertension Mother    Hyperlipidemia Mother  Kidney disease Mother    Eating disorder Mother    Obesity Mother    Heart disease Father    Heart attack Father 8   Congestive Heart Failure Brother    Congestive Heart Failure Son     Social History:  reports that she quit smoking about 23 years ago. She has never used smokeless tobacco. She reports that she does not drink alcohol and does not use drugs.  Allergies:  Allergies  Allergen Reactions   Compazine [Prochlorperazine Edisylate] Swelling and Other (See Comments)     "The tongue hardens and comes out of mouth" (Tardive dyskinesia) Also made the neck muscles twist.   Nsaids Other (See Comments)    WAS TOLD TO NOT TAKE THESE DUE TO KIDNEY ISSUES   Sensipar [Cinacalcet] Nausea And Vomiting    Causes vomiting   Vioxx [Rofecoxib] Other (See Comments)    Caused internal bleeding   Other Other (See Comments)    Unnamed "binder" caused EXTREME constipation    Medications: I have reviewed the patient's current medications. Scheduled:  calcium gluconate  1 g Intravenous Once   BMP Latest Ref Rng & Units 07/26/2021 07/26/2021 07/26/2021  Glucose 70 - 99 mg/dL - 120(H) 128(H)  BUN 6 - 20 mg/dL - 103(H) 90(H)  Creatinine 0.44 - 1.00 mg/dL - 16.10(H) 14.46(H)  BUN/Creat Ratio 9 - 23 - - -  Sodium 135 - 145 mmol/L 135 135 136  Potassium 3.5 - 5.1 mmol/L 6.8(HH) 6.8(HH) 7.1(HH)  Chloride 98 - 111 mmol/L - 104 99  CO2 22 - 32 mmol/L - - 23  Calcium 8.9 - 10.3 mg/dL - - 9.2   CBC Latest Ref Rng & Units 07/26/2021 07/26/2021 07/26/2021  WBC 4.0 - 10.5 K/uL - - 12.2(H)  Hemoglobin 12.0 - 15.0 g/dL 11.9(L) 11.6(L) 11.4(L)  Hematocrit 36.0 - 46.0 % 35.0(L) 34.0(L) 35.9(L)  Platelets 150 - 400 K/uL - - 244     DG Chest 2 View  Result Date: 07/26/2021 CLINICAL DATA:  Shortness of breath. EXAM: CHEST - 2 VIEW COMPARISON:  12/11/2020 FINDINGS: Heart is enlarged and stable in configuration. There is perihilar vascular prominence. No focal consolidation or evidence for pleural effusion. No overt edema. IMPRESSION: Cardiomegaly and pulmonary vascular congestion. No evidence for acute focal pulmonary abnormality. Electronically Signed   By: Nolon Nations M.D.   On: 07/26/2021 13:18    Review of Systems  Constitutional:  Positive for appetite change and fatigue. Negative for chills and fever.  HENT:  Negative for ear pain, facial swelling, nosebleeds, sinus pressure, sinus pain and sore throat.   Eyes:  Negative for photophobia, redness and visual disturbance.  Respiratory:   Positive for shortness of breath. Negative for cough, choking, chest tightness and wheezing.   Cardiovascular:  Positive for leg swelling. Negative for chest pain.  Gastrointestinal:  Positive for nausea. Negative for abdominal pain, diarrhea and vomiting.  Endocrine: Negative for cold intolerance, heat intolerance and polydipsia.  Genitourinary:  Negative for dysuria, flank pain, hematuria and urgency.  Musculoskeletal:  Negative for gait problem, joint swelling and neck stiffness.  Skin:  Negative for wound.  Neurological:  Positive for headaches. Negative for dizziness, tremors and weakness.  Blood pressure (!) 171/78, pulse 78, temperature 98.6 F (37 C), resp. rate (!) 27, last menstrual period 02/02/2015, SpO2 99 %. Physical Exam Vitals and nursing note reviewed.  Constitutional:      Appearance: Normal appearance. She is obese. She is ill-appearing.  HENT:     Head:  Normocephalic and atraumatic.     Right Ear: External ear normal.     Left Ear: External ear normal.     Nose: Nose normal.     Mouth/Throat:     Mouth: Mucous membranes are moist.     Pharynx: Oropharynx is clear.  Eyes:     General: No scleral icterus.    Extraocular Movements: Extraocular movements intact.     Conjunctiva/sclera: Conjunctivae normal.  Cardiovascular:     Rate and Rhythm: Normal rate and regular rhythm.     Pulses: Normal pulses.     Heart sounds: Normal heart sounds.  Pulmonary:     Effort: Pulmonary effort is normal.     Breath sounds: Rales present. No wheezing.  Abdominal:     General: Bowel sounds are normal. There is distension.     Palpations: Abdomen is soft.     Tenderness: There is no abdominal tenderness.  Musculoskeletal:     Cervical back: Normal range of motion and neck supple.     Right lower leg: Edema present.     Left lower leg: Edema present.     Comments: 1-2+ edema over lower extremities. Left brachiocephalic fistula with tortuous vein  Skin:    General: Skin is  warm and dry.     Findings: No erythema.  Neurological:     General: No focal deficit present.     Mental Status: She is alert and oriented to person, place, and time.  Psychiatric:        Mood and Affect: Mood normal.    Assessment/Plan: 1.  Volume overload/hyperkalemia: Secondary to missed dialysis treatments/suboptimal dialysis over the past week due to problems with cannulation difficulty.  Will order for urgent hemodialysis today with prescription modified to limit risk of disequilibrium. 2.  End-stage renal disease: Suboptimal clearance/dialysis treatment because of cannulation difficulties of her right brachiocephalic fistula noted.  Fistulogram yesterday showed patent fistula without focal stenosis however exam indicates a tortuous fistula that may be posing cannulation difficulties.  I will order for hemodialysis today and limit time to 3-1/2 hours with lower blood flow rate of 350 to limit risk of dialysis disequilibrium. 3.  Hypertension: Significantly elevated blood pressure noted that appears to be possibly from a combination of her volume overload and anxiety/distress.  Monitor with resumption of antihypertensive agents along with hemodialysis for ultrafiltration. 4.  Anemia of chronic kidney disease: Hemoglobin and hematocrit currently at goal, no indication for ESA at this time. 5.  Secondary hyperparathyroidism: Resume phosphorus binders and continue low phosphorus renal diet.  Ferrell Claiborne K. 07/26/2021, 3:27 PM

## 2021-07-27 ENCOUNTER — Encounter (HOSPITAL_COMMUNITY): Payer: Self-pay | Admitting: Internal Medicine

## 2021-07-27 ENCOUNTER — Observation Stay (HOSPITAL_COMMUNITY): Payer: Medicare HMO

## 2021-07-27 DIAGNOSIS — I5033 Acute on chronic diastolic (congestive) heart failure: Secondary | ICD-10-CM | POA: Diagnosis not present

## 2021-07-27 DIAGNOSIS — E875 Hyperkalemia: Secondary | ICD-10-CM | POA: Diagnosis not present

## 2021-07-27 LAB — COMPREHENSIVE METABOLIC PANEL
ALT: 22 U/L (ref 0–44)
ALT: 22 U/L (ref 0–44)
AST: 16 U/L (ref 15–41)
AST: 16 U/L (ref 15–41)
Albumin: 3.2 g/dL — ABNORMAL LOW (ref 3.5–5.0)
Albumin: 3.4 g/dL — ABNORMAL LOW (ref 3.5–5.0)
Alkaline Phosphatase: 101 U/L (ref 38–126)
Alkaline Phosphatase: 97 U/L (ref 38–126)
Anion gap: 10 (ref 5–15)
Anion gap: 13 (ref 5–15)
BUN: 28 mg/dL — ABNORMAL HIGH (ref 6–20)
BUN: 40 mg/dL — ABNORMAL HIGH (ref 6–20)
CO2: 26 mmol/L (ref 22–32)
CO2: 24 mmol/L (ref 22–32)
Calcium: 9.2 mg/dL (ref 8.9–10.3)
Calcium: 9.6 mg/dL (ref 8.9–10.3)
Chloride: 98 mmol/L (ref 98–111)
Chloride: 99 mmol/L (ref 98–111)
Creatinine, Ser: 5.82 mg/dL — ABNORMAL HIGH (ref 0.44–1.00)
Creatinine, Ser: 8.7 mg/dL — ABNORMAL HIGH (ref 0.44–1.00)
GFR, Estimated: 8 mL/min — ABNORMAL LOW (ref >60)
GFR, Estimated: 5 mL/min — ABNORMAL LOW (ref >60)
Glucose, Bld: 157 mg/dL — ABNORMAL HIGH (ref 70–99)
Glucose, Bld: 154 mg/dL — ABNORMAL HIGH (ref 70–99)
Potassium: 3.2 mmol/L — ABNORMAL LOW (ref 3.5–5.1)
Potassium: 4.2 mmol/L (ref 3.5–5.1)
Sodium: 134 mmol/L — ABNORMAL LOW (ref 135–145)
Sodium: 136 mmol/L (ref 135–145)
Total Bilirubin: 0.9 mg/dL (ref 0.3–1.2)
Total Bilirubin: 0.8 mg/dL (ref 0.3–1.2)
Total Protein: 7.1 g/dL (ref 6.5–8.1)
Total Protein: 7.1 g/dL (ref 6.5–8.1)

## 2021-07-27 LAB — CBC WITH DIFFERENTIAL/PLATELET
Abs Immature Granulocytes: 0.02 10*3/uL (ref 0.00–0.07)
Basophils Absolute: 0 10*3/uL (ref 0.0–0.1)
Basophils Relative: 0 %
Eosinophils Absolute: 0.2 10*3/uL (ref 0.0–0.5)
Eosinophils Relative: 2 %
HCT: 36.4 % (ref 36.0–46.0)
Hemoglobin: 12 g/dL (ref 12.0–15.0)
Immature Granulocytes: 0 %
Lymphocytes Relative: 24 %
Lymphs Abs: 2 10*3/uL (ref 0.7–4.0)
MCH: 30.6 pg (ref 26.0–34.0)
MCHC: 33 g/dL (ref 30.0–36.0)
MCV: 92.9 fL (ref 80.0–100.0)
Monocytes Absolute: 0.7 10*3/uL (ref 0.1–1.0)
Monocytes Relative: 9 %
Neutro Abs: 5.5 10*3/uL (ref 1.7–7.7)
Neutrophils Relative %: 65 %
Platelets: 188 10*3/uL (ref 150–400)
RBC: 3.92 MIL/uL (ref 3.87–5.11)
RDW: 14.7 % (ref 11.5–15.5)
WBC: 8.4 10*3/uL (ref 4.0–10.5)
nRBC: 0 % (ref 0.0–0.2)

## 2021-07-27 LAB — HEPATITIS B SURFACE ANTIGEN: Hepatitis B Surface Ag: NONREACTIVE

## 2021-07-27 LAB — TSH: TSH: 1.969 u[IU]/mL (ref 0.350–4.500)

## 2021-07-27 LAB — GLUCOSE, CAPILLARY
Glucose-Capillary: 149 mg/dL — ABNORMAL HIGH (ref 70–99)
Glucose-Capillary: 140 mg/dL — ABNORMAL HIGH (ref 70–99)
Glucose-Capillary: 98 mg/dL (ref 70–99)

## 2021-07-27 LAB — PHOSPHORUS: Phosphorus: 5.4 mg/dL — ABNORMAL HIGH (ref 2.5–4.6)

## 2021-07-27 LAB — MRSA NEXT GEN BY PCR, NASAL: MRSA by PCR Next Gen: NOT DETECTED

## 2021-07-27 LAB — MAGNESIUM: Magnesium: 2 mg/dL (ref 1.7–2.4)

## 2021-07-27 MED ORDER — LABETALOL HCL 200 MG PO TABS: 100.0000 mg | ORAL_TABLET | Freq: Every day | ORAL | Status: DC

## 2021-07-27 MED ORDER — HYDROCODONE-ACETAMINOPHEN 5-325 MG PO TABS: 1.0000 | ORAL_TABLET | ORAL | Status: DC | PRN

## 2021-07-27 MED ORDER — SODIUM CHLORIDE 0.9% FLUSH: 3.0000 mL | INTRAVENOUS | Status: DC | PRN

## 2021-07-27 MED ORDER — CINACALCET HCL 30 MG PO TABS
30.0000 mg | ORAL_TABLET | Freq: Every evening | ORAL | Status: DC
  Filled 2021-07-27: qty 1

## 2021-07-27 MED ORDER — CALCIUM ACETATE (PHOS BINDER) 667 MG PO CAPS: 667.0000 mg | ORAL_CAPSULE | Freq: Every day | ORAL | Status: DC

## 2021-07-27 MED ORDER — ACETAMINOPHEN 325 MG PO TABS: 650.0000 mg | ORAL_TABLET | Freq: Four times a day (QID) | ORAL | Status: DC | PRN

## 2021-07-27 MED ORDER — ACETAMINOPHEN 650 MG RE SUPP: 650.0000 mg | Freq: Four times a day (QID) | RECTAL | Status: DC | PRN

## 2021-07-27 MED ORDER — SODIUM CHLORIDE 0.9 % IV SOLN: 250.0000 mL | INTRAVENOUS | Status: DC | PRN

## 2021-07-27 MED ORDER — HEPARIN SODIUM (PORCINE) 5000 UNIT/ML IJ SOLN
5000.0000 [IU] | Freq: Three times a day (TID) | INTRAMUSCULAR | Status: DC
  Administered 2021-07-27: 5000 [IU] via SUBCUTANEOUS
  Filled 2021-07-27: qty 1

## 2021-07-27 MED ORDER — LANTHANUM CARBONATE 500 MG PO CHEW
1000.0000 mg | CHEWABLE_TABLET | Freq: Three times a day (TID) | ORAL | Status: DC
  Administered 2021-07-27 (×2): 1000 mg via ORAL
  Filled 2021-07-27 (×2): qty 2

## 2021-07-27 MED ORDER — SODIUM CHLORIDE 0.9% FLUSH
3.0000 mL | Freq: Two times a day (BID) | INTRAVENOUS | Status: DC
  Administered 2021-07-27 (×2): 3 mL via INTRAVENOUS

## 2021-07-27 MED ORDER — LACTULOSE 10 GM/15ML PO SOLN
30.0000 g | Freq: Every day | ORAL | Status: DC
  Filled 2021-07-27: qty 45

## 2021-07-27 MED ORDER — AMLODIPINE BESYLATE 5 MG PO TABS: 5.0000 mg | ORAL_TABLET | Freq: Every day | ORAL | Status: DC

## 2021-07-27 NOTE — Progress Notes (Signed)
Patient ID: [name], female   DOB: [birth year], 60 y.o.   MRN: [id number] [facility] Progress Note   Assessment/ Plan:   1.  Volume overload/hyperkalemia: Secondary to missed dialysis treatments/suboptimal dialysis over the past week due to problems with cannulation difficulty of her fistula.  Hyperkalemia has been corrected and volume status improved with ultrafiltration on hemodialysis.  She informs me that she would be able to call her dialysis unit for an additional treatment tomorrow for additional ultrafiltration to support discharge home today. 2.  End-stage renal disease: Suboptimal clearance/dialysis treatment because of cannulation difficulties of her right brachiocephalic fistula noted-fistulogram at [facility] was normal.  Hyperkalemia corrected and clinically stable enough to discharge home today with the plan that she will call her outpatient dialysis unit tomorrow for additional treatment for ultrafiltration/volume management. 3.  Hypertension: Significantly elevated blood pressure noted that appears to be possibly from a combination of her volume overload and anxiety/distress.  Improving with resumption of antihypertensive therapy/UF. 4.  Anemia of chronic kidney disease: Hemoglobin and hematocrit currently at goal, no indication for ESA at this time. 5.  Secondary hyperparathyroidism: Resume phosphorus binders and continue low phosphorus renal diet.  Subjective:   Reports to be feeling better with improved breathing after hemodialysis yesterday.  Without need for oxygen supplementation this morning.   Objective:   BP (!) 163/81 (BP Location: Left Wrist)   Pulse 71   Temp 98.5 F (36.9 C) (Oral)   Resp 20   Ht '5\' 4"'$  (1.626 m)   Wt 123.9 kg   LMP 02/02/2015   SpO2 97%   BMI 46.89 kg/m   Intake/Output Summary (Last 24 hours) at 07/27/2021 1009 Last data filed at 07/26/2021 2326 Gross per 24 hour  Intake --  Output 2228 ml  Net -2228 ml    Weight change:   Physical Exam: Gen: Comfortably sleeping in bed, awakens easily to conversation CVS: Pulse regular rhythm, normal rate, S1 and S2 normal Resp: Decreased breath sounds over bases, no distinct rales or rhonchi Abd: Soft, obese, nontender, bowel sounds normal Ext: 1+ lower extremity bilaterally.  Right brachiocephalic fistula with intact dressings.  Imaging: DG Chest 2 View  Result Date: 07/26/2021 CLINICAL DATA:  Shortness of breath. EXAM: CHEST - 2 VIEW COMPARISON:  12/11/2020 FINDINGS: Heart is enlarged and stable in configuration. There is perihilar vascular prominence. No focal consolidation or evidence for pleural effusion. No overt edema. IMPRESSION: Cardiomegaly and pulmonary vascular congestion. No evidence for acute focal pulmonary abnormality. Electronically Signed   By: Nolon Nations M.D.   On: 07/26/2021 13:18    Labs: BMET Recent Labs  Lab 07/26/21 1245 07/26/21 1342 07/26/21 1343 07/26/21 2239 07/27/21 0206  NA 136 135 135 134* 136  K 7.1* 6.8* 6.8* 3.2* 4.2  CL 99 104  --  98 99  CO2 23  --   --  26 24  GLUCOSE 128* 120*  --  157* 154*  BUN 90* 103*  --  28* 40*  CREATININE 14.46* 16.10*  --  5.82* 8.70*  CALCIUM 9.2  --   --  9.2 9.6  PHOS  --   --   --   --  5.4*   CBC Recent Labs  Lab 07/26/21 1245 07/26/21 1342 07/26/21 1343 07/26/21 2239 07/27/21 0206  WBC 12.2*  --   --  10.9* 8.4  NEUTROABS  --   --   --   --  5.5  HGB 11.4* 11.6* 11.9*  11.5* 12.0  HCT 35.9* 34.0* 35.0* 35.6* 36.4  MCV 95.0  --   --  92.5 92.9  PLT 244  --   --  229 188    Medications:     amLODipine  5 mg Oral Q1500   calcium acetate  667 mg Oral QHS   cinacalcet  30 mg Oral QPM   heparin  5,000 Units Subcutaneous Q8H   insulin aspart  0-9 Units Subcutaneous Q4H   labetalol  100 mg Oral Q1500   lactulose  30 g Oral Daily   lanthanum  1,000 mg Oral TID WC   sodium chloride flush  3 mL Intravenous Q12H      Elmarie Shiley, MD 07/27/2021, 10:09 AM

## 2021-07-27 NOTE — Evaluation (Signed)
Occupational Therapy Evaluation Patient Details Name: Pamela Campbell MRN: [id number] DOB: July 18, 1961 Today's Date: 07/27/2021    History of Present Illness 60 y.o. F admitted to [facility] on 8/6 due to SOB and hyperkalemia due to issues with her fistula during dialysis sessions this past week. Prior medical history significant of HTN, chronic diastolic CHF, ESRD on HD on T,H ,Sat. DM2,   Secondary hyperparahyroidism, OSA.   Clinical Impression   Pt admitted for concerns listed above. 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 At this time, pt demonstrated continued independence with all ADL's and functional mobility. Pt has no OT needs at this time and acute OT will sign off.     Follow Up Recommendations  No OT follow up    Equipment Recommendations  None recommended by OT    Recommendations for Other Services       Precautions / Restrictions Precautions Precautions: None Restrictions Weight Bearing Restrictions: No      Mobility Bed Mobility Overal bed mobility: Independent                  Transfers Overall transfer level: Independent Equipment used: None             General transfer comment: Pt completed sit<>stand from bed in lowest setting and toilet with no concerns    Balance Overall balance assessment: Independent                                         ADL either performed or assessed with clinical judgement   ADL Overall ADL's : Independent;At baseline                                       General ADL Comments: No concerns, pt demonstrated good dynamic balancing and full ROM for functional ADL tasks.     Vision Baseline Vision/History: No visual deficits Patient Visual Report: No change from baseline Vision Assessment?: No apparent visual deficits     Perception Perception Perception Tested?: No   Praxis Praxis Praxis tested?: Not  tested    Pertinent Vitals/Pain Pain Assessment: No/denies pain     Hand Dominance Right   Extremity/Trunk Assessment Upper Extremity Assessment Upper Extremity Assessment: Overall WFL for tasks assessed   Lower Extremity Assessment Lower Extremity Assessment: Defer to PT evaluation   Cervical / Trunk Assessment Cervical / Trunk Assessment: Normal   Communication Communication Communication: No difficulties   Cognition Arousal/Alertness: Awake/alert Behavior During Therapy: WFL for tasks assessed/performed Overall Cognitive Status: Within Functional Limits for tasks assessed                                 General Comments: Pt is ready to go home, worried about losing her job   General Comments  VSS on RA, O2 98%, HR 87-96    Exercises     Shoulder Instructions      Home Living Family/patient expects to be discharged to:: Private residence Living Arrangements: Children Available Help at Discharge: Family Type of Home: House Home Access: Stairs to enter Technical brewer of Steps: 8 Entrance Stairs-Rails: Can reach both Home Layout: One level     Bathroom Shower/Tub:  Tub/shower unit   Biochemist, clinical: Standard     Home Equipment: Clinical cytogeneticist - 4 wheels          Prior Functioning/Environment Level of Independence: Independent                 OT Problem List: Decreased activity tolerance;Decreased strength      OT Treatment/Interventions:      OT Goals(Current goals can be found in the care plan section) Acute Rehab OT Goals Patient Stated Goal: To go home, so she can go to work tomorrow OT Goal Formulation: All assessment and education complete, DC therapy Time For Goal Achievement: 07/27/21 Potential to Achieve Goals: Good  OT Frequency:     Barriers to D/C:            Co-evaluation              AM-PAC OT "6 Clicks" Daily Activity     Outcome Measure Help from another person eating meals?: None Help  from another person taking care of personal grooming?: None Help from another person toileting, which includes using toliet, bedpan, or urinal?: None Help from another person bathing (including washing, rinsing, drying)?: None Help from another person to put on and taking off regular upper body clothing?: None Help from another person to put on and taking off regular lower body clothing?: None 6 Click Score: 24   End of Session Nurse Communication: Mobility status  Activity Tolerance: Patient tolerated treatment well Patient left: in bed;with call bell/phone within reach  OT Visit Diagnosis: Muscle weakness (generalized) (M62.81)                Time: [id number] OT Time Calculation (min): 25 min Charges:  OT General Charges $OT Visit: 1 Visit OT Evaluation $OT Eval Low Complexity: 1 Low OT Treatments $Self Care/Home Management : 8-22 mins  Tiffanni Scarfo H., OTR/L Acute Rehabilitation  [place] Elane Axzel [place] 07/27/2021, 9:28 AM

## 2021-07-27 NOTE — Discharge Instructions (Signed)
Advised to follow-up with nephrology as scheduled. Patient has set up hemodialysis tomorrow morning at her routine hemodialysis center. She will be continued on hemodialysis schedule Tuesday Thursday and Saturdays.

## 2021-07-27 NOTE — Discharge Summary (Signed)
Physician Discharge Summary  Pamela Campbell [id number] DOB: [birth year] DOA: 07/26/2021  PCP: Kristen Loader, FNP  Admit date: 07/26/2021  Discharge date: 07/27/2021  Admitted From: Home.  Disposition:  Home.  Recommendations for Outpatient Follow-up:  Follow up with PCP in 1-2 weeks. Please obtain BMP/CBC in one week. Advised to follow-up with nephrology as scheduled. Patient has set up hemodialysis tomorrow morning at her routine hemodialysis center. She will be continued on hemodialysis schedule Tuesday Thursday and Saturdays.  Home Health:None Equipment/Devices:None  Discharge Condition: Good CODE STATUS:Full code Diet recommendation:  Renal Diet  Brief Summary/ Hospital Course: This 60 years old African-American female with PMH significant for hypertension, diabetes, chronic diastolic heart failure, obesity, end-stage renal disease on hemodialysis on TTS schedule presented in the ED with increasing shortness of breath and nausea after having incomplete hemodialysis treatment on Tuesday and Thursday of this week due to problems with her right upper arm dialysis fistula.  Patient had a normal fistulogram study done in the hospital at [facility] , She denies any fever or chills , any cough. Patient was admitted for hyperkalemia,  fluid overload secondary to missed dialysis or incomplete dialysis session.  Nephrology was consulted, patient underwent emergent hemodialysis.  Patient felt better,  potassium has normalized.  Patient wants to be discharged.  Patient has made appointment at her outpatient hemodialysis unit for hemodialysis tomorrow and then she will be started back on her regular TTS schedule for hemodialysis from next week.  Patient is cleared from nephrology to be discharged and patient is being discharged.  Discharge Diagnoses:  Active Problems:   Elevated troponin-demand ischemia   Obesity BMI 45   Sleep apnea   Type 2 diabetes mellitus (HCC)    HLD (hyperlipidemia)   ESRD on dialysis [facility])   Essential hypertension   Minor CAD    Acute on chronic diastolic heart failure (HCC)   Uncontrolled hypertension   Diabetes mellitus type 2 in obese [facility])   Hyperkalemia    Discharge Instructions  Discharge Instructions     Call MD for:  difficulty breathing, headache or visual disturbances   Complete by: As directed    Call MD for:  persistant dizziness or light-headedness   Complete by: As directed    Call MD for:  persistant nausea and vomiting   Complete by: As directed    Diet - low sodium heart healthy   Complete by: As directed    Diet general   Complete by: As directed    Discharge instructions   Complete by: As directed    Advised to follow-up with primary care physician in 1 week. Advised to follow-up with nephrology as scheduled. Patient has set up hemodialysis tomorrow morning at her routine hemodialysis center. She will be continued on hemodialysis schedule Tuesday Thursday and Saturdays.   Increase activity slowly   Complete by: As directed       Allergies as of 07/27/2021       Reactions   Compazine [prochlorperazine Edisylate] Swelling, Other (See Comments)   "The tongue hardens and comes out of mouth" (Tardive dyskinesia) Also made the neck muscles twist.   Nsaids Other (See Comments)   WAS TOLD TO NOT TAKE THESE DUE TO KIDNEY ISSUES   Sensipar [cinacalcet] Nausea And Vomiting   Causes vomiting   Vioxx [rofecoxib] Other (See Comments)   Caused internal bleeding   Other Other (See Comments)   Unnamed "binder" caused EXTREME constipation  Medication List     TAKE these medications    amLODipine 5 MG tablet Commonly known as: NORVASC Take 5 mg by mouth daily in the afternoon.   Calcium Acetate 667 MG Tabs Take 667 mg by mouth at bedtime.   cinacalcet 30 MG tablet Commonly known as: SENSIPAR Take 30 mg by mouth every evening.   Constulose 10 GM/15ML solution Generic drug:  lactulose Take 30 g by mouth daily.   furosemide 40 MG tablet Commonly known as: LASIX Take 40 mg by mouth daily as needed for fluid.   labetalol 100 MG tablet Commonly known as: NORMODYNE Take 100 mg by mouth daily in the afternoon.   lanthanum 1000 MG chewable tablet Commonly known as: FOSRENOL Chew 1,000 mg by mouth 3 (three) times daily with meals.   ondansetron 4 MG disintegrating tablet Commonly known as: ZOFRAN-ODT Take 4 mg by mouth every 6 (six) hours as needed for nausea or vomiting (DISSOLVE ORALLY).   Trulance 3 MG Tabs Generic drug: Plecanatide Take 3 mg by mouth daily as needed (when Constulance is).        Follow-up Information     Kristen Loader, FNP Follow up in 1 week(s).   Specialty: Family Medicine Contact information: [place] [address] [phone]                Allergies  Allergen Reactions   Compazine [Prochlorperazine Edisylate] Swelling and Other (See Comments)    "The tongue hardens and comes out of mouth" (Tardive dyskinesia) Also made the neck muscles twist.   Nsaids Other (See Comments)    WAS TOLD TO NOT TAKE THESE DUE TO KIDNEY ISSUES   Sensipar [Cinacalcet] Nausea And Vomiting    Causes vomiting   Vioxx [Rofecoxib] Other (See Comments)    Caused internal bleeding   Other Other (See Comments)    Unnamed "binder" caused EXTREME constipation    Consultations: Nephrology   Procedures/Studies: DG Chest 2 View  Result Date: 07/26/2021 CLINICAL DATA:  Shortness of breath. EXAM: CHEST - 2 VIEW COMPARISON:  12/11/2020 FINDINGS: Heart is enlarged and stable in configuration. There is perihilar vascular prominence. No focal consolidation or evidence for pleural effusion. No overt edema. IMPRESSION: Cardiomegaly and pulmonary vascular congestion. No evidence for acute focal pulmonary abnormality. Electronically Signed   By: Nolon Nations M.D.   On: 07/26/2021 13:18    Subjective: Patient was seen and  examined at bedside.  Overnight events noted.  Patient reports feeling much improved after having hemodialysis.  She wants to be discharged.  She has made appointment at her outpatient hemodialysis unit for hemodialysis tomorrow.  Discharge Exam: Vitals:   07/27/21 0300 07/27/21 0732  BP: (!) 172/73 (!) 163/81  Pulse: 73 71  Resp: 19 20  Temp: 97.7 F (36.5 C) 98.5 F (36.9 C)  SpO2: 95% 97%   Vitals:   07/26/21 2326 07/27/21 0109 07/27/21 0300 07/27/21 0732  BP:  (!) 162/94 (!) 172/73 (!) 163/81  Pulse:  76 73 71  Resp:  '20 19 20  '$ Temp: (P) 98.4 F (36.9 C) 98.6 F (37 C) 97.7 F (36.5 C) 98.5 F (36.9 C)  TempSrc: (P) Oral Oral Axillary Oral  SpO2:  95% 95% 97%  Weight:  123.9 kg    Height:  '5\' 4"'$  (1.626 m)      General: Pt is alert, awake, not in acute distress Cardiovascular: RRR, S1/S2 +, no rubs, no gallops Respiratory: CTA bilaterally, no wheezing, no rhonchi  Abdominal: Soft, NT, ND, bowel sounds + Extremities: no edema, no cyanosis    The results of significant diagnostics from this hospitalization (including imaging, microbiology, ancillary and laboratory) are listed below for reference.     Microbiology: Recent Results (from the past 240 hour(s))  Resp Panel by RT-PCR (Flu A&B, Covid) Nasopharyngeal Swab     Status: None   Collection Time: 07/26/21  3:32 PM   Specimen: Nasopharyngeal Swab; Nasopharyngeal(NP) swabs in vial transport medium  Result Value Ref Range Status   SARS Coronavirus 2 by RT PCR NEGATIVE NEGATIVE Final    Comment: (NOTE) SARS-CoV-2 target nucleic acids are NOT DETECTED.  The SARS-CoV-2 RNA is generally detectable in upper respiratory specimens during the acute phase of infection. The lowest concentration of SARS-CoV-2 viral copies this assay can detect is 138 copies/mL. A negative result does not preclude SARS-Cov-2 infection and should not be used as the sole basis for treatment or other patient management decisions. A negative  result may occur with  improper specimen collection/handling, submission of specimen other than nasopharyngeal swab, presence of viral mutation(s) within the areas targeted by this assay, and inadequate number of viral copies(<138 copies/mL). A negative result must be combined with clinical observations, patient history, and epidemiological information. The expected result is Negative.  Fact Sheet for Patients:  EntrepreneurPulse.com.au  Fact Sheet for Healthcare Providers:  IncredibleEmployment.be  This test is no t yet approved or cleared by the [place] FDA and  has been authorized for detection and/or diagnosis of SARS-CoV-2 by FDA under an Emergency Use Authorization (EUA). This EUA will remain  in effect (meaning this test can be used) for the duration of the COVID-19 declaration under Section 564(b)(1) of the Act, 21 U.S.C.section 360bbb-3(b)(1), unless the authorization is terminated  or revoked sooner.       Influenza A by PCR NEGATIVE NEGATIVE Final   Influenza B by PCR NEGATIVE NEGATIVE Final    Comment: (NOTE) The Xpert Xpress SARS-CoV-2/FLU/RSV plus assay is intended as an aid in the diagnosis of influenza from Nasopharyngeal swab specimens and should not be used as a sole basis for treatment. Nasal washings and aspirates are unacceptable for Xpert Xpress SARS-CoV-2/FLU/RSV testing.  Fact Sheet for Patients: EntrepreneurPulse.com.au  Fact Sheet for Healthcare Providers: IncredibleEmployment.be  This test is not yet approved or cleared by the [place] FDA and has been authorized for detection and/or diagnosis of SARS-CoV-2 by FDA under an Emergency Use Authorization (EUA). This EUA will remain in effect (meaning this test can be used) for the duration of the COVID-19 declaration under Section 564(b)(1) of the Act, 21 U.S.C. section 360bbb-3(b)(1), unless the authorization is  terminated or revoked.  Performed at [facility], [place] [address] [place], [place] [address]   MRSA Next Gen by PCR, Nasal     Status: None   Collection Time: 07/27/21  5:22 AM   Specimen: Nasal Mucosa; Nasal Swab  Result Value Ref Range Status   MRSA by PCR Next Gen NOT DETECTED NOT DETECTED Final    Comment: (NOTE) The GeneXpert MRSA Assay (FDA approved for NASAL specimens only), is one component of a comprehensive MRSA colonization surveillance program. It is not intended to diagnose MRSA infection nor to guide or monitor treatment for MRSA infections. Test performance is not FDA approved in patients less than 48 years old. Performed at [facility], [place] [address], [place],  [address]      Labs: BNP (last 3 results) Recent Labs  12/09/20 2129 07/26/21 1325  BNP 855.0* 0000000*   Basic Metabolic Panel: Recent Labs  Lab 07/26/21 1245 07/26/21 1342 07/26/21 1343 07/26/21 2239 07/27/21 0206  NA 136 135 135 134* 136  K 7.1* 6.8* 6.8* 3.2* 4.2  CL 99 104  --  98 99  CO2 23  --   --  26 24  GLUCOSE 128* 120*  --  157* 154*  BUN 90* 103*  --  28* 40*  CREATININE 14.46* 16.10*  --  5.82* 8.70*  CALCIUM 9.2  --   --  9.2 9.6  MG  --   --   --   --  2.0  PHOS  --   --   --   --  5.4*   Liver Function Tests: Recent Labs  Lab 07/26/21 2239 07/27/21 0206  AST 16 16  ALT 22 22  ALKPHOS 101 97  BILITOT 0.9 0.8  PROT 7.1 7.1  ALBUMIN 3.2* 3.4*   No results for input(s): LIPASE, AMYLASE in the last 168 hours. No results for input(s): AMMONIA in the last 168 hours. CBC: Recent Labs  Lab 07/26/21 1245 07/26/21 1342 07/26/21 1343 07/26/21 2239 07/27/21 0206  WBC 12.2*  --   --  10.9* 8.4  NEUTROABS  --   --   --   --  5.5  HGB 11.4* 11.6* 11.9* 11.5* 12.0  HCT 35.9* 34.0* 35.0* 35.6* 36.4  MCV 95.0  --   --  92.5 92.9  PLT 244  --   --  229 188   Cardiac Enzymes: No results for input(s): CKTOTAL, CKMB, CKMBINDEX, TROPONINI in the last  168 hours. BNP: Invalid input(s): POCBNP CBG: Recent Labs  Lab 07/26/21 1417 07/26/21 1531 07/27/21 0108 07/27/21 0449 07/27/21 0731  GLUCAP 127* 105* 149* 140* 98   D-Dimer No results for input(s): DDIMER in the last 72 hours. Hgb A1c Recent Labs    07/26/21 2239  HGBA1C 7.3*   Lipid Profile No results for input(s): CHOL, HDL, LDLCALC, TRIG, CHOLHDL, LDLDIRECT in the last 72 hours. Thyroid function studies Recent Labs    07/27/21 0206  TSH 1.969   Anemia work up No results for input(s): VITAMINB12, FOLATE, FERRITIN, TIBC, IRON, RETICCTPCT in the last 72 hours. Urinalysis    Component Value Date/Time   COLORURINE YELLOW 10/26/2016 1518   APPEARANCEUR CLEAR 10/26/2016 1518   LABSPEC 1.026 10/26/2016 1518   PHURINE 5.5 10/26/2016 1518   GLUCOSEU 250 (A) 10/26/2016 1518   HGBUR TRACE (A) 10/26/2016 1518   BILIRUBINUR NEGATIVE 10/26/2016 1518   KETONESUR NEGATIVE 10/26/2016 1518   PROTEINUR >300 (A) 10/26/2016 1518   UROBILINOGEN 0.2 03/24/2015 0037   NITRITE NEGATIVE 10/26/2016 1518   LEUKOCYTESUR NEGATIVE 10/26/2016 1518   Sepsis Labs Invalid input(s): PROCALCITONIN,  WBC,  LACTICIDVEN Microbiology Recent Results (from the past 240 hour(s))  Resp Panel by RT-PCR (Flu A&B, Covid) Nasopharyngeal Swab     Status: None   Collection Time: 07/26/21  3:32 PM   Specimen: Nasopharyngeal Swab; Nasopharyngeal(NP) swabs in vial transport medium  Result Value Ref Range Status   SARS Coronavirus 2 by RT PCR NEGATIVE NEGATIVE Final    Comment: (NOTE) SARS-CoV-2 target nucleic acids are NOT DETECTED.  The SARS-CoV-2 RNA is generally detectable in upper respiratory specimens during the acute phase of infection. The lowest concentration of SARS-CoV-2 viral copies this assay can detect is 138 copies/mL. A negative result does not preclude SARS-Cov-2 infection and should not be used as the sole basis  for treatment or other patient management decisions. A negative result may  occur with  improper specimen collection/handling, submission of specimen other than nasopharyngeal swab, presence of viral mutation(s) within the areas targeted by this assay, and inadequate number of viral copies(<138 copies/mL). A negative result must be combined with clinical observations, patient history, and epidemiological information. The expected result is Negative.  Fact Sheet for Patients:  EntrepreneurPulse.com.au  Fact Sheet for Healthcare Providers:  IncredibleEmployment.be  This test is no t yet approved or cleared by the [place] FDA and  has been authorized for detection and/or diagnosis of SARS-CoV-2 by FDA under an Emergency Use Authorization (EUA). This EUA will remain  in effect (meaning this test can be used) for the duration of the COVID-19 declaration under Section 564(b)(1) of the Act, 21 U.S.C.section 360bbb-3(b)(1), unless the authorization is terminated  or revoked sooner.       Influenza A by PCR NEGATIVE NEGATIVE Final   Influenza B by PCR NEGATIVE NEGATIVE Final    Comment: (NOTE) The Xpert Xpress SARS-CoV-2/FLU/RSV plus assay is intended as an aid in the diagnosis of influenza from Nasopharyngeal swab specimens and should not be used as a sole basis for treatment. Nasal washings and aspirates are unacceptable for Xpert Xpress SARS-CoV-2/FLU/RSV testing.  Fact Sheet for Patients: EntrepreneurPulse.com.au  Fact Sheet for Healthcare Providers: IncredibleEmployment.be  This test is not yet approved or cleared by the [place] FDA and has been authorized for detection and/or diagnosis of SARS-CoV-2 by FDA under an Emergency Use Authorization (EUA). This EUA will remain in effect (meaning this test can be used) for the duration of the COVID-19 declaration under Section 564(b)(1) of the Act, 21 U.S.C. section 360bbb-3(b)(1), unless the authorization is terminated  or revoked.  Performed at [facility], [place] [address], [place], [place] [address]   MRSA Next Gen by PCR, Nasal     Status: None   Collection Time: 07/27/21  5:22 AM   Specimen: Nasal Mucosa; Nasal Swab  Result Value Ref Range Status   MRSA by PCR Next Gen NOT DETECTED NOT DETECTED Final    Comment: (NOTE) The GeneXpert MRSA Assay (FDA approved for NASAL specimens only), is one component of a comprehensive MRSA colonization surveillance program. It is not intended to diagnose MRSA infection nor to guide or monitor treatment for MRSA infections. Test performance is not FDA approved in patients less than 84 years old. Performed at [facility], [place] [address], [place], [place] [address]      Time coordinating discharge: Over 30 minutes  SIGNED:   Shawna Clamp, MD  [facility] 07/27/2021, 3:33 PM Pager   If 7PM-7AM, please contact night-coverage

## 2021-07-27 NOTE — Progress Notes (Signed)
PT Cancellation Note  Patient Details Name: Pamela Campbell MRN: [id number] DOB: 06/28/1961   Cancelled Treatment:    Reason Eval/Treat Not Completed: PT screened, no needs identified, will sign off per OT note, patient independent. Screened patient, she reports no concerns with mobility and is at her functional baseline- no need for skilled PT services. Signing off, thank you for the referral!!   Ann Lions PT, DPT, PN2   Supplemental Physical Therapist [place]    Pager [phone] Acute Rehab Office [phone] s

## 2021-07-29 DIAGNOSIS — D631 Anemia in chronic kidney disease: Secondary | ICD-10-CM | POA: Diagnosis not present

## 2021-07-29 DIAGNOSIS — D509 Iron deficiency anemia, unspecified: Secondary | ICD-10-CM | POA: Diagnosis not present

## 2021-07-29 DIAGNOSIS — N2581 Secondary hyperparathyroidism of renal origin: Secondary | ICD-10-CM | POA: Diagnosis not present

## 2021-07-29 DIAGNOSIS — N186 End stage renal disease: Secondary | ICD-10-CM | POA: Diagnosis not present

## 2021-07-29 LAB — HEPATITIS B SURFACE ANTIBODY, QUANTITATIVE: Hep B S AB Quant (Post): 7.8 m[IU]/mL — ABNORMAL LOW (ref >9.9)

## 2021-07-31 DIAGNOSIS — D631 Anemia in chronic kidney disease: Secondary | ICD-10-CM | POA: Diagnosis not present

## 2021-07-31 DIAGNOSIS — N2581 Secondary hyperparathyroidism of renal origin: Secondary | ICD-10-CM | POA: Diagnosis not present

## 2021-07-31 DIAGNOSIS — N186 End stage renal disease: Secondary | ICD-10-CM | POA: Diagnosis not present

## 2021-07-31 DIAGNOSIS — D509 Iron deficiency anemia, unspecified: Secondary | ICD-10-CM | POA: Diagnosis not present

## 2021-08-02 DIAGNOSIS — D509 Iron deficiency anemia, unspecified: Secondary | ICD-10-CM | POA: Diagnosis not present

## 2021-08-02 DIAGNOSIS — D631 Anemia in chronic kidney disease: Secondary | ICD-10-CM | POA: Diagnosis not present

## 2021-08-02 DIAGNOSIS — N2581 Secondary hyperparathyroidism of renal origin: Secondary | ICD-10-CM | POA: Diagnosis not present

## 2021-08-02 DIAGNOSIS — N186 End stage renal disease: Secondary | ICD-10-CM | POA: Diagnosis not present

## 2021-08-05 DIAGNOSIS — D509 Iron deficiency anemia, unspecified: Secondary | ICD-10-CM | POA: Diagnosis not present

## 2021-08-05 DIAGNOSIS — D631 Anemia in chronic kidney disease: Secondary | ICD-10-CM | POA: Diagnosis not present

## 2021-08-05 DIAGNOSIS — N186 End stage renal disease: Secondary | ICD-10-CM | POA: Diagnosis not present

## 2021-08-05 DIAGNOSIS — N2581 Secondary hyperparathyroidism of renal origin: Secondary | ICD-10-CM | POA: Diagnosis not present

## 2021-08-07 DIAGNOSIS — N186 End stage renal disease: Secondary | ICD-10-CM | POA: Diagnosis not present

## 2021-08-07 DIAGNOSIS — N2581 Secondary hyperparathyroidism of renal origin: Secondary | ICD-10-CM | POA: Diagnosis not present

## 2021-08-07 DIAGNOSIS — D631 Anemia in chronic kidney disease: Secondary | ICD-10-CM | POA: Diagnosis not present

## 2021-08-07 DIAGNOSIS — D509 Iron deficiency anemia, unspecified: Secondary | ICD-10-CM | POA: Diagnosis not present

## 2021-08-09 DIAGNOSIS — D509 Iron deficiency anemia, unspecified: Secondary | ICD-10-CM | POA: Diagnosis not present

## 2021-08-09 DIAGNOSIS — N2581 Secondary hyperparathyroidism of renal origin: Secondary | ICD-10-CM | POA: Diagnosis not present

## 2021-08-09 DIAGNOSIS — D631 Anemia in chronic kidney disease: Secondary | ICD-10-CM | POA: Diagnosis not present

## 2021-08-09 DIAGNOSIS — N186 End stage renal disease: Secondary | ICD-10-CM | POA: Diagnosis not present

## 2021-08-12 DIAGNOSIS — D509 Iron deficiency anemia, unspecified: Secondary | ICD-10-CM | POA: Diagnosis not present

## 2021-08-12 DIAGNOSIS — N186 End stage renal disease: Secondary | ICD-10-CM | POA: Diagnosis not present

## 2021-08-12 DIAGNOSIS — N2581 Secondary hyperparathyroidism of renal origin: Secondary | ICD-10-CM | POA: Diagnosis not present

## 2021-08-12 DIAGNOSIS — D631 Anemia in chronic kidney disease: Secondary | ICD-10-CM | POA: Diagnosis not present

## 2021-08-14 DIAGNOSIS — D509 Iron deficiency anemia, unspecified: Secondary | ICD-10-CM | POA: Diagnosis not present

## 2021-08-14 DIAGNOSIS — N2581 Secondary hyperparathyroidism of renal origin: Secondary | ICD-10-CM | POA: Diagnosis not present

## 2021-08-14 DIAGNOSIS — N186 End stage renal disease: Secondary | ICD-10-CM | POA: Diagnosis not present

## 2021-08-14 DIAGNOSIS — D631 Anemia in chronic kidney disease: Secondary | ICD-10-CM | POA: Diagnosis not present

## 2021-08-16 DIAGNOSIS — N2581 Secondary hyperparathyroidism of renal origin: Secondary | ICD-10-CM | POA: Diagnosis not present

## 2021-08-16 DIAGNOSIS — N186 End stage renal disease: Secondary | ICD-10-CM | POA: Diagnosis not present

## 2021-08-16 DIAGNOSIS — D631 Anemia in chronic kidney disease: Secondary | ICD-10-CM | POA: Diagnosis not present

## 2021-08-16 DIAGNOSIS — D509 Iron deficiency anemia, unspecified: Secondary | ICD-10-CM | POA: Diagnosis not present

## 2021-08-19 DIAGNOSIS — N186 End stage renal disease: Secondary | ICD-10-CM | POA: Diagnosis not present

## 2021-08-19 DIAGNOSIS — N2581 Secondary hyperparathyroidism of renal origin: Secondary | ICD-10-CM | POA: Diagnosis not present

## 2021-08-19 DIAGNOSIS — D631 Anemia in chronic kidney disease: Secondary | ICD-10-CM | POA: Diagnosis not present

## 2021-08-19 DIAGNOSIS — D509 Iron deficiency anemia, unspecified: Secondary | ICD-10-CM | POA: Diagnosis not present

## 2021-08-21 DIAGNOSIS — N2581 Secondary hyperparathyroidism of renal origin: Secondary | ICD-10-CM | POA: Diagnosis not present

## 2021-08-21 DIAGNOSIS — D509 Iron deficiency anemia, unspecified: Secondary | ICD-10-CM | POA: Diagnosis not present

## 2021-08-21 DIAGNOSIS — D631 Anemia in chronic kidney disease: Secondary | ICD-10-CM | POA: Diagnosis not present

## 2021-08-21 DIAGNOSIS — N186 End stage renal disease: Secondary | ICD-10-CM | POA: Diagnosis not present

## 2021-08-21 DIAGNOSIS — I10 Essential (primary) hypertension: Secondary | ICD-10-CM | POA: Diagnosis not present

## 2021-08-23 DIAGNOSIS — D509 Iron deficiency anemia, unspecified: Secondary | ICD-10-CM | POA: Diagnosis not present

## 2021-08-23 DIAGNOSIS — D631 Anemia in chronic kidney disease: Secondary | ICD-10-CM | POA: Diagnosis not present

## 2021-08-23 DIAGNOSIS — N186 End stage renal disease: Secondary | ICD-10-CM | POA: Diagnosis not present

## 2021-08-23 DIAGNOSIS — N2581 Secondary hyperparathyroidism of renal origin: Secondary | ICD-10-CM | POA: Diagnosis not present

## 2021-08-26 DIAGNOSIS — D509 Iron deficiency anemia, unspecified: Secondary | ICD-10-CM | POA: Diagnosis not present

## 2021-08-26 DIAGNOSIS — N186 End stage renal disease: Secondary | ICD-10-CM | POA: Diagnosis not present

## 2021-08-26 DIAGNOSIS — D631 Anemia in chronic kidney disease: Secondary | ICD-10-CM | POA: Diagnosis not present

## 2021-08-26 DIAGNOSIS — N2581 Secondary hyperparathyroidism of renal origin: Secondary | ICD-10-CM | POA: Diagnosis not present

## 2021-08-28 DIAGNOSIS — D631 Anemia in chronic kidney disease: Secondary | ICD-10-CM | POA: Diagnosis not present

## 2021-08-28 DIAGNOSIS — N186 End stage renal disease: Secondary | ICD-10-CM | POA: Diagnosis not present

## 2021-08-28 DIAGNOSIS — N2581 Secondary hyperparathyroidism of renal origin: Secondary | ICD-10-CM | POA: Diagnosis not present

## 2021-08-28 DIAGNOSIS — D509 Iron deficiency anemia, unspecified: Secondary | ICD-10-CM | POA: Diagnosis not present

## 2021-08-30 DIAGNOSIS — N2581 Secondary hyperparathyroidism of renal origin: Secondary | ICD-10-CM | POA: Diagnosis not present

## 2021-08-30 DIAGNOSIS — D631 Anemia in chronic kidney disease: Secondary | ICD-10-CM | POA: Diagnosis not present

## 2021-08-30 DIAGNOSIS — D509 Iron deficiency anemia, unspecified: Secondary | ICD-10-CM | POA: Diagnosis not present

## 2021-08-30 DIAGNOSIS — N186 End stage renal disease: Secondary | ICD-10-CM | POA: Diagnosis not present

## 2021-09-02 DIAGNOSIS — N186 End stage renal disease: Secondary | ICD-10-CM | POA: Diagnosis not present

## 2021-09-02 DIAGNOSIS — D631 Anemia in chronic kidney disease: Secondary | ICD-10-CM | POA: Diagnosis not present

## 2021-09-02 DIAGNOSIS — N2581 Secondary hyperparathyroidism of renal origin: Secondary | ICD-10-CM | POA: Diagnosis not present

## 2021-09-02 DIAGNOSIS — D509 Iron deficiency anemia, unspecified: Secondary | ICD-10-CM | POA: Diagnosis not present

## 2021-09-04 DIAGNOSIS — D509 Iron deficiency anemia, unspecified: Secondary | ICD-10-CM | POA: Diagnosis not present

## 2021-09-04 DIAGNOSIS — D631 Anemia in chronic kidney disease: Secondary | ICD-10-CM | POA: Diagnosis not present

## 2021-09-04 DIAGNOSIS — N2581 Secondary hyperparathyroidism of renal origin: Secondary | ICD-10-CM | POA: Diagnosis not present

## 2021-09-04 DIAGNOSIS — N186 End stage renal disease: Secondary | ICD-10-CM | POA: Diagnosis not present

## 2021-09-06 DIAGNOSIS — N186 End stage renal disease: Secondary | ICD-10-CM | POA: Diagnosis not present

## 2021-09-06 DIAGNOSIS — N2581 Secondary hyperparathyroidism of renal origin: Secondary | ICD-10-CM | POA: Diagnosis not present

## 2021-09-06 DIAGNOSIS — D631 Anemia in chronic kidney disease: Secondary | ICD-10-CM | POA: Diagnosis not present

## 2021-09-06 DIAGNOSIS — D509 Iron deficiency anemia, unspecified: Secondary | ICD-10-CM | POA: Diagnosis not present

## 2021-09-09 DIAGNOSIS — N2581 Secondary hyperparathyroidism of renal origin: Secondary | ICD-10-CM | POA: Diagnosis not present

## 2021-09-09 DIAGNOSIS — D631 Anemia in chronic kidney disease: Secondary | ICD-10-CM | POA: Diagnosis not present

## 2021-09-09 DIAGNOSIS — N186 End stage renal disease: Secondary | ICD-10-CM | POA: Diagnosis not present

## 2021-09-09 DIAGNOSIS — D509 Iron deficiency anemia, unspecified: Secondary | ICD-10-CM | POA: Diagnosis not present

## 2021-09-11 DIAGNOSIS — N2581 Secondary hyperparathyroidism of renal origin: Secondary | ICD-10-CM | POA: Diagnosis not present

## 2021-09-11 DIAGNOSIS — N186 End stage renal disease: Secondary | ICD-10-CM | POA: Diagnosis not present

## 2021-09-11 DIAGNOSIS — D631 Anemia in chronic kidney disease: Secondary | ICD-10-CM | POA: Diagnosis not present

## 2021-09-11 DIAGNOSIS — D509 Iron deficiency anemia, unspecified: Secondary | ICD-10-CM | POA: Diagnosis not present

## 2021-09-13 DIAGNOSIS — N2581 Secondary hyperparathyroidism of renal origin: Secondary | ICD-10-CM | POA: Diagnosis not present

## 2021-09-13 DIAGNOSIS — D631 Anemia in chronic kidney disease: Secondary | ICD-10-CM | POA: Diagnosis not present

## 2021-09-13 DIAGNOSIS — N186 End stage renal disease: Secondary | ICD-10-CM | POA: Diagnosis not present

## 2021-09-13 DIAGNOSIS — D509 Iron deficiency anemia, unspecified: Secondary | ICD-10-CM | POA: Diagnosis not present

## 2021-09-16 DIAGNOSIS — N186 End stage renal disease: Secondary | ICD-10-CM | POA: Diagnosis not present

## 2021-09-16 DIAGNOSIS — N2581 Secondary hyperparathyroidism of renal origin: Secondary | ICD-10-CM | POA: Diagnosis not present

## 2021-09-16 DIAGNOSIS — D509 Iron deficiency anemia, unspecified: Secondary | ICD-10-CM | POA: Diagnosis not present

## 2021-09-16 DIAGNOSIS — D631 Anemia in chronic kidney disease: Secondary | ICD-10-CM | POA: Diagnosis not present

## 2021-09-18 DIAGNOSIS — N2581 Secondary hyperparathyroidism of renal origin: Secondary | ICD-10-CM | POA: Diagnosis not present

## 2021-09-18 DIAGNOSIS — D631 Anemia in chronic kidney disease: Secondary | ICD-10-CM | POA: Diagnosis not present

## 2021-09-18 DIAGNOSIS — N186 End stage renal disease: Secondary | ICD-10-CM | POA: Diagnosis not present

## 2021-09-18 DIAGNOSIS — D509 Iron deficiency anemia, unspecified: Secondary | ICD-10-CM | POA: Diagnosis not present

## 2021-09-20 DIAGNOSIS — Z23 Encounter for immunization: Secondary | ICD-10-CM | POA: Diagnosis not present

## 2021-09-20 DIAGNOSIS — I10 Essential (primary) hypertension: Secondary | ICD-10-CM | POA: Diagnosis not present

## 2021-09-20 DIAGNOSIS — N186 End stage renal disease: Secondary | ICD-10-CM | POA: Diagnosis not present

## 2021-09-20 DIAGNOSIS — D631 Anemia in chronic kidney disease: Secondary | ICD-10-CM | POA: Diagnosis not present

## 2021-09-20 DIAGNOSIS — D509 Iron deficiency anemia, unspecified: Secondary | ICD-10-CM | POA: Diagnosis not present

## 2021-09-20 DIAGNOSIS — E785 Hyperlipidemia, unspecified: Secondary | ICD-10-CM | POA: Diagnosis not present

## 2021-09-20 DIAGNOSIS — N2581 Secondary hyperparathyroidism of renal origin: Secondary | ICD-10-CM | POA: Diagnosis not present

## 2021-09-23 DIAGNOSIS — D631 Anemia in chronic kidney disease: Secondary | ICD-10-CM | POA: Diagnosis not present

## 2021-09-23 DIAGNOSIS — Z23 Encounter for immunization: Secondary | ICD-10-CM | POA: Diagnosis not present

## 2021-09-23 DIAGNOSIS — E785 Hyperlipidemia, unspecified: Secondary | ICD-10-CM | POA: Diagnosis not present

## 2021-09-23 DIAGNOSIS — N2581 Secondary hyperparathyroidism of renal origin: Secondary | ICD-10-CM | POA: Diagnosis not present

## 2021-09-23 DIAGNOSIS — D509 Iron deficiency anemia, unspecified: Secondary | ICD-10-CM | POA: Diagnosis not present

## 2021-09-23 DIAGNOSIS — N186 End stage renal disease: Secondary | ICD-10-CM | POA: Diagnosis not present

## 2021-09-25 DIAGNOSIS — E1122 Type 2 diabetes mellitus with diabetic chronic kidney disease: Secondary | ICD-10-CM | POA: Diagnosis not present

## 2021-09-25 DIAGNOSIS — N186 End stage renal disease: Secondary | ICD-10-CM | POA: Diagnosis not present

## 2021-09-25 DIAGNOSIS — D509 Iron deficiency anemia, unspecified: Secondary | ICD-10-CM | POA: Diagnosis not present

## 2021-09-25 DIAGNOSIS — Z23 Encounter for immunization: Secondary | ICD-10-CM | POA: Diagnosis not present

## 2021-09-25 DIAGNOSIS — E785 Hyperlipidemia, unspecified: Secondary | ICD-10-CM | POA: Diagnosis not present

## 2021-09-25 DIAGNOSIS — D631 Anemia in chronic kidney disease: Secondary | ICD-10-CM | POA: Diagnosis not present

## 2021-09-25 DIAGNOSIS — N2581 Secondary hyperparathyroidism of renal origin: Secondary | ICD-10-CM | POA: Diagnosis not present

## 2021-09-27 DIAGNOSIS — E785 Hyperlipidemia, unspecified: Secondary | ICD-10-CM | POA: Diagnosis not present

## 2021-09-27 DIAGNOSIS — Z23 Encounter for immunization: Secondary | ICD-10-CM | POA: Diagnosis not present

## 2021-09-27 DIAGNOSIS — N186 End stage renal disease: Secondary | ICD-10-CM | POA: Diagnosis not present

## 2021-09-27 DIAGNOSIS — D509 Iron deficiency anemia, unspecified: Secondary | ICD-10-CM | POA: Diagnosis not present

## 2021-09-27 DIAGNOSIS — N2581 Secondary hyperparathyroidism of renal origin: Secondary | ICD-10-CM | POA: Diagnosis not present

## 2021-09-27 DIAGNOSIS — D631 Anemia in chronic kidney disease: Secondary | ICD-10-CM | POA: Diagnosis not present

## 2021-09-30 DIAGNOSIS — N2581 Secondary hyperparathyroidism of renal origin: Secondary | ICD-10-CM | POA: Diagnosis not present

## 2021-09-30 DIAGNOSIS — E785 Hyperlipidemia, unspecified: Secondary | ICD-10-CM | POA: Diagnosis not present

## 2021-09-30 DIAGNOSIS — Z23 Encounter for immunization: Secondary | ICD-10-CM | POA: Diagnosis not present

## 2021-09-30 DIAGNOSIS — N186 End stage renal disease: Secondary | ICD-10-CM | POA: Diagnosis not present

## 2021-09-30 DIAGNOSIS — D631 Anemia in chronic kidney disease: Secondary | ICD-10-CM | POA: Diagnosis not present

## 2021-09-30 DIAGNOSIS — D509 Iron deficiency anemia, unspecified: Secondary | ICD-10-CM | POA: Diagnosis not present

## 2021-10-02 DIAGNOSIS — D509 Iron deficiency anemia, unspecified: Secondary | ICD-10-CM | POA: Diagnosis not present

## 2021-10-02 DIAGNOSIS — N186 End stage renal disease: Secondary | ICD-10-CM | POA: Diagnosis not present

## 2021-10-02 DIAGNOSIS — E785 Hyperlipidemia, unspecified: Secondary | ICD-10-CM | POA: Diagnosis not present

## 2021-10-02 DIAGNOSIS — N2581 Secondary hyperparathyroidism of renal origin: Secondary | ICD-10-CM | POA: Diagnosis not present

## 2021-10-02 DIAGNOSIS — Z23 Encounter for immunization: Secondary | ICD-10-CM | POA: Diagnosis not present

## 2021-10-02 DIAGNOSIS — D631 Anemia in chronic kidney disease: Secondary | ICD-10-CM | POA: Diagnosis not present

## 2021-10-04 DIAGNOSIS — N2581 Secondary hyperparathyroidism of renal origin: Secondary | ICD-10-CM | POA: Diagnosis not present

## 2021-10-04 DIAGNOSIS — D509 Iron deficiency anemia, unspecified: Secondary | ICD-10-CM | POA: Diagnosis not present

## 2021-10-04 DIAGNOSIS — E785 Hyperlipidemia, unspecified: Secondary | ICD-10-CM | POA: Diagnosis not present

## 2021-10-04 DIAGNOSIS — Z23 Encounter for immunization: Secondary | ICD-10-CM | POA: Diagnosis not present

## 2021-10-04 DIAGNOSIS — D631 Anemia in chronic kidney disease: Secondary | ICD-10-CM | POA: Diagnosis not present

## 2021-10-04 DIAGNOSIS — N186 End stage renal disease: Secondary | ICD-10-CM | POA: Diagnosis not present

## 2021-10-07 DIAGNOSIS — Z23 Encounter for immunization: Secondary | ICD-10-CM | POA: Diagnosis not present

## 2021-10-07 DIAGNOSIS — N2581 Secondary hyperparathyroidism of renal origin: Secondary | ICD-10-CM | POA: Diagnosis not present

## 2021-10-07 DIAGNOSIS — N186 End stage renal disease: Secondary | ICD-10-CM | POA: Diagnosis not present

## 2021-10-07 DIAGNOSIS — D509 Iron deficiency anemia, unspecified: Secondary | ICD-10-CM | POA: Diagnosis not present

## 2021-10-07 DIAGNOSIS — E785 Hyperlipidemia, unspecified: Secondary | ICD-10-CM | POA: Diagnosis not present

## 2021-10-07 DIAGNOSIS — D631 Anemia in chronic kidney disease: Secondary | ICD-10-CM | POA: Diagnosis not present

## 2021-10-09 DIAGNOSIS — D509 Iron deficiency anemia, unspecified: Secondary | ICD-10-CM | POA: Diagnosis not present

## 2021-10-09 DIAGNOSIS — D631 Anemia in chronic kidney disease: Secondary | ICD-10-CM | POA: Diagnosis not present

## 2021-10-09 DIAGNOSIS — N186 End stage renal disease: Secondary | ICD-10-CM | POA: Diagnosis not present

## 2021-10-09 DIAGNOSIS — E785 Hyperlipidemia, unspecified: Secondary | ICD-10-CM | POA: Diagnosis not present

## 2021-10-09 DIAGNOSIS — Z23 Encounter for immunization: Secondary | ICD-10-CM | POA: Diagnosis not present

## 2021-10-09 DIAGNOSIS — N2581 Secondary hyperparathyroidism of renal origin: Secondary | ICD-10-CM | POA: Diagnosis not present

## 2021-10-11 DIAGNOSIS — E785 Hyperlipidemia, unspecified: Secondary | ICD-10-CM | POA: Diagnosis not present

## 2021-10-11 DIAGNOSIS — D631 Anemia in chronic kidney disease: Secondary | ICD-10-CM | POA: Diagnosis not present

## 2021-10-11 DIAGNOSIS — Z23 Encounter for immunization: Secondary | ICD-10-CM | POA: Diagnosis not present

## 2021-10-11 DIAGNOSIS — N186 End stage renal disease: Secondary | ICD-10-CM | POA: Diagnosis not present

## 2021-10-11 DIAGNOSIS — D509 Iron deficiency anemia, unspecified: Secondary | ICD-10-CM | POA: Diagnosis not present

## 2021-10-11 DIAGNOSIS — N2581 Secondary hyperparathyroidism of renal origin: Secondary | ICD-10-CM | POA: Diagnosis not present

## 2021-10-14 DIAGNOSIS — D509 Iron deficiency anemia, unspecified: Secondary | ICD-10-CM | POA: Diagnosis not present

## 2021-10-14 DIAGNOSIS — E785 Hyperlipidemia, unspecified: Secondary | ICD-10-CM | POA: Diagnosis not present

## 2021-10-14 DIAGNOSIS — N186 End stage renal disease: Secondary | ICD-10-CM | POA: Diagnosis not present

## 2021-10-14 DIAGNOSIS — D631 Anemia in chronic kidney disease: Secondary | ICD-10-CM | POA: Diagnosis not present

## 2021-10-14 DIAGNOSIS — Z23 Encounter for immunization: Secondary | ICD-10-CM | POA: Diagnosis not present

## 2021-10-14 DIAGNOSIS — N2581 Secondary hyperparathyroidism of renal origin: Secondary | ICD-10-CM | POA: Diagnosis not present

## 2021-10-15 ENCOUNTER — Ambulatory Visit
Admission: RE | Admit: 2021-10-15 | Discharge: 2021-10-15 | Disposition: A | Discharge status: Home / Self Care | Payer: Managed Care, Other (non HMO) | Source: Ambulatory Visit | Attending: Family Medicine | Admitting: Family Medicine

## 2021-10-15 ENCOUNTER — Other Ambulatory Visit: Payer: Self-pay | Admitting: Family Medicine

## 2021-10-15 DIAGNOSIS — N2581 Secondary hyperparathyroidism of renal origin: Secondary | ICD-10-CM | POA: Diagnosis not present

## 2021-10-15 DIAGNOSIS — R0602 Shortness of breath: Secondary | ICD-10-CM

## 2021-10-15 DIAGNOSIS — Z992 Dependence on renal dialysis: Secondary | ICD-10-CM | POA: Diagnosis not present

## 2021-10-15 DIAGNOSIS — E1122 Type 2 diabetes mellitus with diabetic chronic kidney disease: Secondary | ICD-10-CM | POA: Diagnosis not present

## 2021-10-15 DIAGNOSIS — R110 Nausea: Secondary | ICD-10-CM | POA: Diagnosis not present

## 2021-10-15 DIAGNOSIS — I120 Hypertensive chronic kidney disease with stage 5 chronic kidney disease or end stage renal disease: Secondary | ICD-10-CM | POA: Diagnosis not present

## 2021-10-15 DIAGNOSIS — E782 Mixed hyperlipidemia: Secondary | ICD-10-CM | POA: Diagnosis not present

## 2021-10-15 DIAGNOSIS — D649 Anemia, unspecified: Secondary | ICD-10-CM | POA: Diagnosis not present

## 2021-10-15 DIAGNOSIS — I517 Cardiomegaly: Secondary | ICD-10-CM | POA: Diagnosis not present

## 2021-10-15 DIAGNOSIS — I5030 Unspecified diastolic (congestive) heart failure: Secondary | ICD-10-CM | POA: Diagnosis not present

## 2021-10-16 DIAGNOSIS — D631 Anemia in chronic kidney disease: Secondary | ICD-10-CM | POA: Diagnosis not present

## 2021-10-16 DIAGNOSIS — D509 Iron deficiency anemia, unspecified: Secondary | ICD-10-CM | POA: Diagnosis not present

## 2021-10-16 DIAGNOSIS — E785 Hyperlipidemia, unspecified: Secondary | ICD-10-CM | POA: Diagnosis not present

## 2021-10-16 DIAGNOSIS — Z23 Encounter for immunization: Secondary | ICD-10-CM | POA: Diagnosis not present

## 2021-10-16 DIAGNOSIS — N2581 Secondary hyperparathyroidism of renal origin: Secondary | ICD-10-CM | POA: Diagnosis not present

## 2021-10-16 DIAGNOSIS — N186 End stage renal disease: Secondary | ICD-10-CM | POA: Diagnosis not present

## 2021-10-18 DIAGNOSIS — E785 Hyperlipidemia, unspecified: Secondary | ICD-10-CM | POA: Diagnosis not present

## 2021-10-18 DIAGNOSIS — D631 Anemia in chronic kidney disease: Secondary | ICD-10-CM | POA: Diagnosis not present

## 2021-10-18 DIAGNOSIS — N186 End stage renal disease: Secondary | ICD-10-CM | POA: Diagnosis not present

## 2021-10-18 DIAGNOSIS — N2581 Secondary hyperparathyroidism of renal origin: Secondary | ICD-10-CM | POA: Diagnosis not present

## 2021-10-18 DIAGNOSIS — Z23 Encounter for immunization: Secondary | ICD-10-CM | POA: Diagnosis not present

## 2021-10-18 DIAGNOSIS — D509 Iron deficiency anemia, unspecified: Secondary | ICD-10-CM | POA: Diagnosis not present

## 2021-10-21 DIAGNOSIS — N2581 Secondary hyperparathyroidism of renal origin: Secondary | ICD-10-CM | POA: Diagnosis not present

## 2021-10-21 DIAGNOSIS — N186 End stage renal disease: Secondary | ICD-10-CM | POA: Diagnosis not present

## 2021-10-21 DIAGNOSIS — D631 Anemia in chronic kidney disease: Secondary | ICD-10-CM | POA: Diagnosis not present

## 2021-10-21 DIAGNOSIS — D509 Iron deficiency anemia, unspecified: Secondary | ICD-10-CM | POA: Diagnosis not present

## 2021-10-21 DIAGNOSIS — I10 Essential (primary) hypertension: Secondary | ICD-10-CM | POA: Diagnosis not present

## 2021-10-22 ENCOUNTER — Encounter: Payer: Self-pay | Admitting: Internal Medicine

## 2021-10-22 ENCOUNTER — Other Ambulatory Visit: Payer: Self-pay

## 2021-10-22 ENCOUNTER — Ambulatory Visit (INDEPENDENT_AMBULATORY_CARE_PROVIDER_SITE_OTHER): Payer: Medicare HMO | Admitting: Internal Medicine

## 2021-10-22 VITALS — BP 180/70 | HR 86 | Ht 64.0 in | Wt 271.8 lb

## 2021-10-22 DIAGNOSIS — I5032 Chronic diastolic (congestive) heart failure: Secondary | ICD-10-CM | POA: Diagnosis not present

## 2021-10-22 DIAGNOSIS — N186 End stage renal disease: Secondary | ICD-10-CM

## 2021-10-22 DIAGNOSIS — E785 Hyperlipidemia, unspecified: Secondary | ICD-10-CM | POA: Diagnosis not present

## 2021-10-22 DIAGNOSIS — I10 Essential (primary) hypertension: Secondary | ICD-10-CM

## 2021-10-22 DIAGNOSIS — E1100 Type 2 diabetes mellitus with hyperosmolarity without nonketotic hyperglycemic-hyperosmolar coma (NKHHC): Secondary | ICD-10-CM | POA: Diagnosis not present

## 2021-10-22 DIAGNOSIS — I110 Hypertensive heart disease with heart failure: Secondary | ICD-10-CM

## 2021-10-22 DIAGNOSIS — Z992 Dependence on renal dialysis: Secondary | ICD-10-CM | POA: Diagnosis not present

## 2021-10-22 DIAGNOSIS — I5033 Acute on chronic diastolic (congestive) heart failure: Secondary | ICD-10-CM | POA: Diagnosis not present

## 2021-10-22 MED ORDER — AMLODIPINE BESYLATE 10 MG PO TABS: 10.0000 mg | ORAL_TABLET | Freq: Every day | ORAL | 3 refills | Status: DC

## 2021-10-22 MED ORDER — ASPIRIN EC 81 MG PO TBEC: 81.0000 mg | DELAYED_RELEASE_TABLET | Freq: Every day | ORAL | 3 refills | Status: AC

## 2021-10-22 MED ORDER — METOLAZONE 5 MG PO TABS: 5.0000 mg | ORAL_TABLET | ORAL | 0 refills | Status: DC | PRN

## 2021-10-22 MED ORDER — LOSARTAN POTASSIUM 25 MG PO TABS: 12.5000 mg | ORAL_TABLET | Freq: Every day | ORAL | 3 refills | Status: DC

## 2021-10-22 MED ORDER — ATORVASTATIN CALCIUM 40 MG PO TABS: 40.0000 mg | ORAL_TABLET | Freq: Every day | ORAL | 3 refills | Status: DC

## 2021-10-22 NOTE — Progress Notes (Signed)
Cardiology Office Note:    Date:  10/22/2021   ID:  Pamela Campbell, DOB [birth year], MRN [id number]  PCP:  Kristen Loader, FNP   [facility] Providers Cardiologist:  None     Referring MD: Almedia Balls, NP   Chief Complaint: Dyspnea   History of Present Illness:    PROBLEM LIST: 1.  Hypertension 2.  BMI greater than 30 3.  Type 2 diabetes 4.  End-stage renal disease on dialysis Tuesday Thursday Saturday 5.  Chronic diastolic dysfunction; cardiac catheterization 2017 demonstrated mild obstructive coronary artery disease 6.  Hyperlipidemia   Pamela Campbell is a 60 y.o. female with the indicated medical problems here for recommendations regarding shortness of breath.  She was admitted to the hospital in August after few days of missing dialysis.  She underwent dialysis and was discharged a few days later.  The patient is doing relatively well today.  She is going to dialysis on a regular basis.  She will get moderately short of breath on Monday after a long stretch without dialysis.  After dialysis she seems to feel relatively well.  Unfortunately her blood pressure remains very high.  She tells me that she feels lightheaded and dizzy when her blood pressure is less than 160 mmHg.  She denies any chest pain, palpitations, paroxysmal nocturnal dyspnea, orthopnea.  She does have some mild lower extremity edema which is chronic.  She has had no bleeding or bruising episodes.  She has required no hospitalizations or emergency room visits since her last one in August.    Previous Medical/Surgical History:    Past Medical History:  Diagnosis Date   Anemia    Chronic diastolic CHF (congestive heart failure) ([place])    a. 03/2015: echo w/ EF of 50-55%, no WMA, Grade 2 DD, trivial AI, mild MR.   Chronic kidney disease (CKD), stage V ([place])    dialysis tuesday, thursday saterday   Constipation    Diabetes mellitus without complication ([place])    CONTROLLED WITH DIET   Dyspnea    non  lately   Edema, lower extremity    Hypertension    Overweight    Pneumonia 2016   WHILE HOSPIATLIZED   Shortness of breath     Past Surgical History:  Procedure Laterality Date   AV FISTULA PLACEMENT Right 01/04/2018   Procedure: ARTERIOVENOUS (AV) FISTULA CREATION RIGHT ARM;  Surgeon: Conrad [place], MD;  Location: [place];  Service: Vascular;  Laterality: Right;   AV FISTULA PLACEMENT Right 01/24/2018   Procedure: ARTERIOVENOUS (AV) FISTULA CREATION RIGHT BRACHIOCEPHALIC;  Surgeon: Conrad Malaga, MD;  Location: [place];  Service: Vascular;  Laterality: Right;   CARDIAC CATHETERIZATION N/A 11/02/2016   Procedure: Left Heart Cath and Coronary Angiography;  Surgeon: Sherren Mocha, MD;  Location: [place] [facility];  Service: Cardiovascular;  Laterality: N/A;   CESAREAN SECTION     COLONOSCOPY     DILATATION & CURETTAGE/HYSTEROSCOPY WITH MYOSURE N/A 09/02/2018   Procedure: DILATATION & CURETTAGE/HYSTEROSCOPY WITH MYOSURE;  Surgeon: Christophe Louis, MD;  Location: [facility];  Service: Gynecology;  Laterality: N/A;   FISTULA PLUG     FISTULA SUPERFICIALIZATION Right 05/02/2018   Procedure: FISTULA SUPERFICIALIZATION RIGHT ARTERIOVENOUS FISTULA;  Surgeon: Conrad Brandon, MD;  Location: [place];  Service: Vascular;  Laterality: Right;   FRACTURE SURGERY     shoulder left   INSERTION OF DIALYSIS CATHETER Right 01/04/2018   Procedure: INSERTION OF DIALYSIS CATHETER RIGHT INTERNAL JUGULAR;  Surgeon: Bridgett Larsson,  Jannette Fogo, MD;  [facility];  Service: Vascular;  Laterality: Right;   IR FLUORO GUIDE CV LINE RIGHT  12/31/2017   IR US GUIDE VASC ACCESS RIGHT  12/31/2017   left shoulder rotator     LIGATION OF ARTERIOVENOUS  FISTULA Right 01/24/2018   Procedure: LIGATION OF  RIGHT RADIOCEPHALIC ARTERIOVENOUS  FISTULA;  Surgeon: Conrad Benitez, MD;  Location: [place];  Service: Vascular;  Laterality: Right;   TONSILECTOMY, ADENOIDECTOMY, BILATERAL MYRINGOTOMY AND TUBES     TOOTH EXTRACTION     TUBAL LIGATION      Current  Medications: Current Meds  Medication Sig   sevelamer carbonate (RENVELA) 800 MG tablet Take 1 tablet by mouth as directed. 3-4 TIMES A DAY   [DISCONTINUED] amLODipine (NORVASC) 5 MG tablet Take 5 mg by mouth daily.     Allergies:   Compazine [prochlorperazine edisylate], Nsaids, Sensipar [cinacalcet], Vioxx [rofecoxib], and Other   Social History:    Social History   Socioeconomic History   Marital status: Divorced    Spouse name: Not on file   Number of children: Not on file   Years of education: Not on file   Highest education level: Not on file  Occupational History   Occupation: [employer]  Tobacco Use   Smoking status: Former    Types: Cigarettes    Quit date: 03/21/1998    Years since quitting: 23.6   Smokeless tobacco: Never  Vaping Use   Vaping Use: Never used  Substance and Sexual Activity   Alcohol use: No   Drug use: No   Sexual activity: Not Currently    Birth control/protection: None  Other Topics Concern   Not on file  Social History Narrative   Not on file   Social Determinants of Health   Financial Resource Strain: Not on file  Food Insecurity: Not on file  Transportation Needs: Not on file  Physical Activity: Not on file  Stress: Not on file  Social Connections: Not on file     Family History:  The patient's family history includes Congestive Heart Failure in her brother and son; Diabetes Mellitus II in her mother; Eating disorder in her mother; Heart attack (age of onset: 45) in her father; Heart disease in her father; Hyperlipidemia in her mother; Hypertension in her mother; Kidney disease in her mother; Obesity in her mother.  ROS:  Please see the history of present illness.  All other systems reviewed and are negative.  EKGs/Labs/Other Studies Reviewed:    The following studies were reviewed today: EKG from August of this year demonstrated normal sinus rhythm with anterior infarct pattern  Echocardiogram from 2021 demonstrated normal LV  function with ejection fraction of 55 to 60% with normal valve function.  There is evidence of diastolic dysfunction.  I reviewed her cardiac catheterization from 2017 which showed minimal obstructive coronary artery disease  EKG:   The ekg ordered today demonstrates EKG today demonstrates sinus rhythm with no evidence of LVH.  Recent Labs: 07/26/2021: B Natriuretic Peptide 366.9 07/27/2021: ALT 22; BUN 40; Creatinine, Ser 8.70; Hemoglobin 12.0; Magnesium 2.0; Platelets 188; Potassium 4.2; Sodium 136; TSH 1.969   Hemoglobin A1c of 6.6 TSH of 2.07 recent cholesterol 143, LDL 70, triglycerides 122, and HDL 52 with LFTs within normal limits aside from mildly elevated alkaline phosphatase.  Recent Lipid Panel Her most recent lipid panel earlier this month demonstrated a cholesterol of 143 and an LDL of 70, triglycerides 122, and HDL 52  Risk  Assessment/Calculations:           Physical Exam:    VS:  BP (!) 180/70   Pulse 86   Ht 5\' 4"  (1.626 m)   Wt 271 lb 12.8 oz (123.3 kg)   LMP 02/02/2015   SpO2 95%   BMI 46.65 kg/m     Wt Readings from Last 3 Encounters:  10/22/21 271 lb 12.8 oz (123.3 kg)  07/27/21 273 lb 3.2 oz (123.9 kg)  12/09/20 277 lb 12.5 oz (126 kg)     GEN: Well nourished, well developed in no acute distress HEENT: Normal NECK: No JVD; No carotid bruits LYMPHATICS: No lymphadenopathy CARDIAC: RRR, no murmurs, rubs, gallops; RUE fistula RESPIRATORY:  Clear to auscultation without rales, wheezing or rhonchi  ABDOMEN: Soft, non-tender, non-distended MUSCULOSKELETAL:  No edema; No deformity  SKIN: Warm and dry NEUROLOGIC:  Alert and oriented x 3 PSYCHIATRIC:  Normal affect   ASSESSMENT and PLAN   Morbid obesity ([place]) Continue current treatment, encouraged exercise and watching diet.  Dyslipidemia Her recent lipid panel was very good however given her history of diabetes she should be on a moderate dose statin.  We will start atorvastatin 40 mg daily given its  pleiotropic effects.  We will check lipid panel and CMP in 6 weeks time.  Type 2 diabetes mellitus ([place]) She should be on aspirin.  We will start aspirin 81 mg daily and atorvastatin as detailed below.  Given that she is on dialysis we can start losartan 12.5 for blood pressure control.  She is not a candidate for an SGLT2 inhibitor.  Chronic diastolic congestive heart failure ([place]) She is not a candidate for an SGLT2 inhibitor due to end-stage renal disease.  We can consider starting spironolactone 25 mg daily in the future.  Essential hypertension We will obtain an echocardiogram.  I have asked her to increase her amlodipine to 10 mg.  We will start losartan 12.5 mg as well.  She tells me that she feels out of sorts when her blood pressure is less than 200 mmHg.  We really need to get her back in the normal range.  I think a reasonable goal is 160 mmHg with further titration to less than 131 we can do this.  I will see her back in 6 months or earlier if needed.  Chronic diastolic heart failure ([place]) Because she gets short of breath on Mondays after several days without dialysis I will prescribe her metolazone 5 mg on a as needed basis.              Medication Adjustments/Labs and Tests Ordered: Current medicines are reviewed at length with the patient today.  Concerns regarding medicines are outlined above.  No orders of the defined types were placed in this encounter.  No orders of the defined types were placed in this encounter.   There are no Patient Instructions on file for this visit.   Signed, Early Osmond, MD  10/22/2021 9:20 AM    [place]

## 2021-10-22 NOTE — Assessment & Plan Note (Signed)
Continue current treatment, encouraged exercise and watching diet.

## 2021-10-22 NOTE — Assessment & Plan Note (Addendum)
She is not a candidate for an SGLT2 inhibitor due to end-stage renal disease.  We can consider starting spironolactone 25 mg daily in the future.

## 2021-10-22 NOTE — Assessment & Plan Note (Signed)
Because she gets short of breath on Mondays after several days without dialysis I will prescribe her metolazone 5 mg on a as needed basis.

## 2021-10-22 NOTE — Patient Instructions (Signed)
Medication Instructions:  Your physician has recommended you make the following change in your medication:  1.) start aspirin 81 mg - one tablet daily 2.) start atorvastatin 40 mg - one tablet daily 3.) losartan 12.5 mg - one tablet daily 4.) metolazone 5 mg - one tablet as needed for shortness of breath 5.) increase amlodipine to 10 mg daily  *If you need a refill on your cardiac medications before your next appointment, please call your pharmacy*   Lab Work: Please return in 6 weeks (around Dec 15) for blood work (Lipids, CMET)  If you have labs (blood work) drawn today and your tests are completely normal, you will receive your results only by: [employer] (if you have MyChart) OR A paper copy in the mail If you have any lab test that is abnormal or we need to change your treatment, we will call you to review the results.   Testing/Procedures: Your physician has requested that you have an echocardiogram. Echocardiography is a painless test that uses sound waves to create images of your heart. It provides your doctor with information about the size and shape of your heart and how well your heart's chambers and valves are working. This procedure takes approximately one hour. There are no restrictions for this procedure.   Follow-Up: At [facility], you and your health needs are our priority.  As part of our continuing mission to provide you with exceptional heart care, we have created designated Provider Care Teams.  These Care Teams include your primary Cardiologist (physician) and Advanced Practice Providers (APPs -  Physician Assistants and Nurse Practitioners) who all work together to provide you with the care you need, when you need it.   Your next appointment:   6 month(s)  The format for your next appointment:   In Person  Provider:   Lenna Sciara, MD   Other Instructions

## 2021-10-22 NOTE — Assessment & Plan Note (Deleted)
She is not a candidate for an SGLT2 inhibitor due to end-stage renal disease.  She is a candidate for spironolactone as long as she does not miss dialysis sessions.  I will start spironolactone 25 mg daily.

## 2021-10-22 NOTE — Assessment & Plan Note (Signed)
We will obtain an echocardiogram.  I have asked her to increase her amlodipine to 10 mg.  We will start losartan 12.5 mg as well.  She tells me that she feels out of sorts when her blood pressure is less than 200 mmHg.  We really need to get her back in the normal range.  I think a reasonable goal is 160 mmHg with further titration to less than 131 we can do this.  I will see her back in 6 months or earlier if needed.

## 2021-10-22 NOTE — Assessment & Plan Note (Addendum)
Her recent lipid panel was very good however given her history of diabetes she should be on a moderate dose statin.  We will start atorvastatin 40 mg daily given its pleiotropic effects.  We will check lipid panel and CMP in 6 weeks time.

## 2021-10-22 NOTE — Assessment & Plan Note (Deleted)
We will obtain an echocardiogram.  I have asked her to increase her amlodipine to 10 mg.  We will start losartan 12.5 mg as well.  She tells me that she feels out of sorts when her blood pressure is less than 200 mmHg.  We really need to get her back in the normal range.  I think a reasonable goal is 160 mmHg with further titration to less than 131 we can do this.  I will see her back in 6 months or earlier if needed.

## 2021-10-22 NOTE — Assessment & Plan Note (Addendum)
She should be on aspirin.  We will start aspirin 81 mg daily and atorvastatin as detailed below.  Given that she is on dialysis we can start losartan 12.5 for blood pressure control.  She is not a candidate for an SGLT2 inhibitor.

## 2021-10-23 DIAGNOSIS — D631 Anemia in chronic kidney disease: Secondary | ICD-10-CM | POA: Diagnosis not present

## 2021-10-23 DIAGNOSIS — N186 End stage renal disease: Secondary | ICD-10-CM | POA: Diagnosis not present

## 2021-10-23 DIAGNOSIS — N2581 Secondary hyperparathyroidism of renal origin: Secondary | ICD-10-CM | POA: Diagnosis not present

## 2021-10-23 DIAGNOSIS — D509 Iron deficiency anemia, unspecified: Secondary | ICD-10-CM | POA: Diagnosis not present

## 2021-10-25 DIAGNOSIS — N186 End stage renal disease: Secondary | ICD-10-CM | POA: Diagnosis not present

## 2021-10-25 DIAGNOSIS — N2581 Secondary hyperparathyroidism of renal origin: Secondary | ICD-10-CM | POA: Diagnosis not present

## 2021-10-25 DIAGNOSIS — D631 Anemia in chronic kidney disease: Secondary | ICD-10-CM | POA: Diagnosis not present

## 2021-10-25 DIAGNOSIS — D509 Iron deficiency anemia, unspecified: Secondary | ICD-10-CM | POA: Diagnosis not present

## 2021-10-28 DIAGNOSIS — D631 Anemia in chronic kidney disease: Secondary | ICD-10-CM | POA: Diagnosis not present

## 2021-10-28 DIAGNOSIS — D509 Iron deficiency anemia, unspecified: Secondary | ICD-10-CM | POA: Diagnosis not present

## 2021-10-28 DIAGNOSIS — N186 End stage renal disease: Secondary | ICD-10-CM | POA: Diagnosis not present

## 2021-10-28 DIAGNOSIS — N2581 Secondary hyperparathyroidism of renal origin: Secondary | ICD-10-CM | POA: Diagnosis not present

## 2021-10-29 DIAGNOSIS — E782 Mixed hyperlipidemia: Secondary | ICD-10-CM | POA: Diagnosis not present

## 2021-10-29 DIAGNOSIS — I5030 Unspecified diastolic (congestive) heart failure: Secondary | ICD-10-CM | POA: Diagnosis not present

## 2021-10-29 DIAGNOSIS — E1122 Type 2 diabetes mellitus with diabetic chronic kidney disease: Secondary | ICD-10-CM | POA: Diagnosis not present

## 2021-10-29 DIAGNOSIS — Z992 Dependence on renal dialysis: Secondary | ICD-10-CM | POA: Diagnosis not present

## 2021-10-29 DIAGNOSIS — I120 Hypertensive chronic kidney disease with stage 5 chronic kidney disease or end stage renal disease: Secondary | ICD-10-CM | POA: Diagnosis not present

## 2021-10-29 DIAGNOSIS — R0602 Shortness of breath: Secondary | ICD-10-CM | POA: Diagnosis not present

## 2021-10-30 DIAGNOSIS — D509 Iron deficiency anemia, unspecified: Secondary | ICD-10-CM | POA: Diagnosis not present

## 2021-10-30 DIAGNOSIS — D631 Anemia in chronic kidney disease: Secondary | ICD-10-CM | POA: Diagnosis not present

## 2021-10-30 DIAGNOSIS — N2581 Secondary hyperparathyroidism of renal origin: Secondary | ICD-10-CM | POA: Diagnosis not present

## 2021-10-30 DIAGNOSIS — N186 End stage renal disease: Secondary | ICD-10-CM | POA: Diagnosis not present

## 2021-11-01 DIAGNOSIS — D631 Anemia in chronic kidney disease: Secondary | ICD-10-CM | POA: Diagnosis not present

## 2021-11-01 DIAGNOSIS — N186 End stage renal disease: Secondary | ICD-10-CM | POA: Diagnosis not present

## 2021-11-01 DIAGNOSIS — N2581 Secondary hyperparathyroidism of renal origin: Secondary | ICD-10-CM | POA: Diagnosis not present

## 2021-11-01 DIAGNOSIS — D509 Iron deficiency anemia, unspecified: Secondary | ICD-10-CM | POA: Diagnosis not present

## 2021-11-04 DIAGNOSIS — D631 Anemia in chronic kidney disease: Secondary | ICD-10-CM | POA: Diagnosis not present

## 2021-11-04 DIAGNOSIS — D509 Iron deficiency anemia, unspecified: Secondary | ICD-10-CM | POA: Diagnosis not present

## 2021-11-04 DIAGNOSIS — N2581 Secondary hyperparathyroidism of renal origin: Secondary | ICD-10-CM | POA: Diagnosis not present

## 2021-11-04 DIAGNOSIS — N186 End stage renal disease: Secondary | ICD-10-CM | POA: Diagnosis not present

## 2021-11-06 DIAGNOSIS — N186 End stage renal disease: Secondary | ICD-10-CM | POA: Diagnosis not present

## 2021-11-06 DIAGNOSIS — D509 Iron deficiency anemia, unspecified: Secondary | ICD-10-CM | POA: Diagnosis not present

## 2021-11-06 DIAGNOSIS — D631 Anemia in chronic kidney disease: Secondary | ICD-10-CM | POA: Diagnosis not present

## 2021-11-06 DIAGNOSIS — N2581 Secondary hyperparathyroidism of renal origin: Secondary | ICD-10-CM | POA: Diagnosis not present

## 2021-11-08 DIAGNOSIS — N186 End stage renal disease: Secondary | ICD-10-CM | POA: Diagnosis not present

## 2021-11-08 DIAGNOSIS — N2581 Secondary hyperparathyroidism of renal origin: Secondary | ICD-10-CM | POA: Diagnosis not present

## 2021-11-08 DIAGNOSIS — D509 Iron deficiency anemia, unspecified: Secondary | ICD-10-CM | POA: Diagnosis not present

## 2021-11-08 DIAGNOSIS — D631 Anemia in chronic kidney disease: Secondary | ICD-10-CM | POA: Diagnosis not present

## 2021-11-10 DIAGNOSIS — N186 End stage renal disease: Secondary | ICD-10-CM | POA: Diagnosis not present

## 2021-11-10 DIAGNOSIS — D509 Iron deficiency anemia, unspecified: Secondary | ICD-10-CM | POA: Diagnosis not present

## 2021-11-10 DIAGNOSIS — D631 Anemia in chronic kidney disease: Secondary | ICD-10-CM | POA: Diagnosis not present

## 2021-11-10 DIAGNOSIS — N2581 Secondary hyperparathyroidism of renal origin: Secondary | ICD-10-CM | POA: Diagnosis not present

## 2021-11-11 ENCOUNTER — Ambulatory Visit (HOSPITAL_COMMUNITY)
Admission: EM | Admit: 2021-11-11 | Discharge: 2021-11-11 | Disposition: A | Discharge status: Home / Self Care | Payer: Managed Care, Other (non HMO) | Attending: Emergency Medicine | Admitting: Emergency Medicine

## 2021-11-11 ENCOUNTER — Ambulatory Visit (INDEPENDENT_AMBULATORY_CARE_PROVIDER_SITE_OTHER): Payer: Managed Care, Other (non HMO)

## 2021-11-11 ENCOUNTER — Encounter (HOSPITAL_COMMUNITY): Payer: Self-pay | Admitting: Emergency Medicine

## 2021-11-11 ENCOUNTER — Other Ambulatory Visit: Payer: Self-pay

## 2021-11-11 DIAGNOSIS — M79671 Pain in right foot: Secondary | ICD-10-CM

## 2021-11-11 DIAGNOSIS — Z0389 Encounter for observation for other suspected diseases and conditions ruled out: Secondary | ICD-10-CM | POA: Diagnosis not present

## 2021-11-11 MED ORDER — MUPIROCIN CALCIUM 2 % EX CREA: 1.0000 "application " | TOPICAL_CREAM | Freq: Every day | CUTANEOUS | 0 refills | Status: DC

## 2021-11-11 NOTE — ED Notes (Signed)
Called patient from the waiting room and cell phone and no answer

## 2021-11-11 NOTE — ED Triage Notes (Signed)
Pt reports stepped on piece of broken glass in right foot from 2 days ago. Reports is painful. Son tried to dig glass out but was going further into foot.

## 2021-11-11 NOTE — ED Provider Notes (Signed)
[place]    CSN: [id number] Arrival date & time: 11/11/21  1204      History   Chief Complaint Chief Complaint  Patient presents with   Foot Injury    HPI Pamela Campbell is a 60 y.o. female.   Patient presents with pain to the left foot and possible foreign body after stepping in a glass 2 days ago.  Endorses that she can feel something in her foot when walking.  Attempted to remove glass, was unsuccessful and patient feels that he may have pushed it deeper into the foot.  Denies fever, chills, drainage, numbness, tingling.  Range of motion intact, able to bear weight.  History of diabetes, CKD, CHF, hypertension, lower extremity edema  Past Medical History:  Diagnosis Date   Anemia    Chronic diastolic CHF (congestive heart failure) ([place])    a. 03/2015: echo w/ EF of 50-55%, no WMA, Grade 2 DD, trivial AI, mild MR.   Chronic kidney disease (CKD), stage V ([place])    dialysis tuesday, thursday saterday   Constipation    Diabetes mellitus without complication ([place])    CONTROLLED WITH DIET   Dyspnea    non lately   Edema, lower extremity    Hypertension    Overweight    Pneumonia 2016   WHILE HOSPIATLIZED   Shortness of breath     Patient Active Problem List   Diagnosis Date Noted   Morbid obesity ([place]) 10/22/2021   Chronic diastolic heart failure ([place]) 10/22/2021   Hyperkalemia 12/10/2020   SIRS (systemic inflammatory response syndrome) ([place]) 12/10/2020   Vitamin D deficiency 02/13/2019   Postmenopausal bleeding 09/02/2018   Acute on chronic diastolic CHF (congestive heart failure) ([place]) 12/31/2017   Acute viral bronchitis    Cough in adult 12/30/2017   Uncontrolled hypertension 12/30/2017   Diabetes mellitus type 2 in obese ([place]) 94/17/4081   Metabolic acidosis 44/81/8563   Hypertensive urgency 10/11/2017   Hepatitis C antibody test positive 12/08/2016   Minor CAD     Abnormal stress test    Essential hypertension    ESRD on dialysis [facility])     Unstable angina pectoris ([place])    Chest pain with high risk for cardiac etiology 10/24/2016   HLD (hyperlipidemia) 07/17/2015   Type 2 diabetes mellitus ([place]) 03/27/2015   Chronic diastolic congestive heart failure ([place]) 03/25/2015   Accelerated hypertension 03/25/2015   Obesity BMI 45 03/25/2015   Sleep apnea 03/25/2015   Dyslipidemia 03/25/2015   Elevated troponin-demand ischemia 03/24/2015   Hypertensive heart disease 03/24/2015   Shortness of breath 03/24/2015    Past Surgical History:  Procedure Laterality Date   AV FISTULA PLACEMENT Right 01/04/2018   Procedure: ARTERIOVENOUS (AV) FISTULA CREATION RIGHT ARM;  Surgeon: Conrad [place], MD;  Location: [place];  Service: Vascular;  Laterality: Right;   AV FISTULA PLACEMENT Right 01/24/2018   Procedure: ARTERIOVENOUS (AV) FISTULA CREATION RIGHT BRACHIOCEPHALIC;  Surgeon: Conrad [place], MD;  Location: [place];  Service: Vascular;  Laterality: Right;   CARDIAC CATHETERIZATION N/A 11/02/2016   Procedure: Left Heart Cath and Coronary Angiography;  Surgeon: Sherren Mocha, MD;  Location: [place] [facility];  Service: Cardiovascular;  Laterality: N/A;   CESAREAN SECTION     COLONOSCOPY     DILATATION & CURETTAGE/HYSTEROSCOPY WITH MYOSURE N/A 09/02/2018   Procedure: DILATATION & CURETTAGE/HYSTEROSCOPY WITH MYOSURE;  Surgeon: Christophe Louis, MD;  Location: [facility];  Service: Gynecology;  Laterality: N/A;   FISTULA PLUG  FISTULA SUPERFICIALIZATION Right 05/02/2018   Procedure: FISTULA SUPERFICIALIZATION RIGHT ARTERIOVENOUS FISTULA;  Surgeon: Conrad Sasakwa, MD;  Location: [place];  Service: Vascular;  Laterality: Right;   FRACTURE SURGERY     shoulder left   INSERTION OF DIALYSIS CATHETER Right 01/04/2018   Procedure: INSERTION OF DIALYSIS CATHETER RIGHT INTERNAL JUGULAR;  Surgeon: Conrad [place], MD;  Location: [place];  Service: Vascular;  Laterality: Right;   IR FLUORO GUIDE CV LINE RIGHT  12/31/2017   IR US GUIDE VASC ACCESS RIGHT  12/31/2017   left  shoulder rotator     LIGATION OF ARTERIOVENOUS  FISTULA Right 01/24/2018   Procedure: LIGATION OF  RIGHT RADIOCEPHALIC ARTERIOVENOUS  FISTULA;  Surgeon: Conrad , MD;  [facility];  Service: Vascular;  Laterality: Right;   TONSILECTOMY, ADENOIDECTOMY, BILATERAL MYRINGOTOMY AND TUBES     TOOTH EXTRACTION     TUBAL LIGATION      OB History     Gravida  3   Para  3   Term      Preterm      AB      Living         SAB      IAB      Ectopic      Multiple      Live Births               Home Medications    Prior to Admission medications   Medication Sig Start Date End Date Taking? Authorizing Provider  mupirocin cream (BACTROBAN) 2 % Apply 1 application topically daily. 11/11/21  Yes Salote Weidmann R, NP  amLODipine (NORVASC) 10 MG tablet Take 1 tablet (10 mg total) by mouth daily. 10/22/21   Early Osmond, MD  aspirin EC 81 MG tablet Take 1 tablet (81 mg total) by mouth daily. Swallow whole. 10/22/21   Early Osmond, MD  atorvastatin (LIPITOR) 40 MG tablet Take 1 tablet (40 mg total) by mouth daily. 10/22/21   Early Osmond, MD  losartan (COZAAR) 25 MG tablet Take 0.5 tablets (12.5 mg total) by mouth daily. 10/22/21   Early Osmond, MD  metolazone (ZAROXOLYN) 5 MG tablet Take 1 tablet (5 mg total) by mouth as needed. For shortness of breath 10/22/21   Early Osmond, MD  sevelamer carbonate (RENVELA) 800 MG tablet Take 1 tablet by mouth as directed. 3-4 TIMES A DAY 07/01/21   [provider]  calcium carbonate (OS-CAL) 1250 (500 Ca) MG chewable tablet Chew 1 tablet by mouth daily.  08/01/20  [provider]    Family History Family History  Problem Relation Age of Onset   Diabetes Mellitus II Mother    Hypertension Mother    Hyperlipidemia Mother    Kidney disease Mother    Eating disorder Mother    Obesity Mother    Heart disease Father    Heart attack Father 22   Congestive Heart Failure Brother    Congestive Heart Failure  Son     Social History Social History   Tobacco Use   Smoking status: Former    Types: Cigarettes    Quit date: 03/21/1998    Years since quitting: 23.6   Smokeless tobacco: Never  Vaping Use   Vaping Use: Never used  Substance Use Topics   Alcohol use: No   Drug use: No     Allergies   Compazine [prochlorperazine edisylate], Nsaids, Sensipar [cinacalcet], Vioxx [rofecoxib], and Other  Review of Systems Review of Systems  Constitutional: Negative.   Respiratory: Negative.    Cardiovascular: Negative.   Skin:  Positive for wound. Negative for color change, pallor and rash.  Neurological: Negative.     Physical Exam Triage Vital Signs ED Triage Vitals  Enc Vitals Group     BP 11/11/21 1442 (!) 175/86     Pulse Rate 11/11/21 1442 81     Resp 11/11/21 1442 18     Temp 11/11/21 1442 98.5 F (36.9 C)     Temp Source 11/11/21 1442 Oral     SpO2 11/11/21 1442 96 %     Weight --      Height --      Head Circumference --      Peak Flow --      Pain Score 11/11/21 1439 5     Pain Loc --      Pain Edu? --      Excl. in [place]? --    No data found.  Updated Vital Signs BP (!) 175/86 (BP Location: Left Arm)   Pulse 81   Temp 98.5 F (36.9 C) (Oral)   Resp 18   LMP 02/02/2015   SpO2 96%   Visual Acuity Right Eye Distance:   Left Eye Distance:   Bilateral Distance:    Right Eye Near:   Left Eye Near:    Bilateral Near:     Physical Exam Constitutional:      Appearance: Normal appearance. She is normal weight.  Eyes:     Extraocular Movements: Extraocular movements intact.  Pulmonary:     Effort: Pulmonary effort is normal.  Musculoskeletal:       Feet:  Feet:     Comments: 1 cm x 1 cm wound to the dorsal aspect of the forefoot, extending into the epidermis ,no edema, drainage noted, 2+ pedal pulse, sensation intact, capillary refill less than 3, range of motion intact, able to bear weight Skin:    General: Skin is warm and dry.  Neurological:      Mental Status: She is alert and oriented to person, place, and time. Mental status is at baseline.  Psychiatric:        Mood and Affect: Mood normal.        Behavior: Behavior normal.     UC Treatments / Results  Labs (all labs ordered are listed, but only abnormal results are displayed) Labs Reviewed - No data to display  EKG   Radiology No results found.  Procedures Procedures (including critical care time)  Medications Ordered in UC Medications - No data to display  Initial Impression / Assessment and Plan / UC Course  I have reviewed the triage vital signs and the nursing notes.  Pertinent labs & imaging results that were available during my care of the patient were reviewed by me and considered in my medical decision making (see chart for details).  Acute foot pain on the right  Unable to palpate foreign body, x-ray negative, will not attempt to remove foreign body, discussed with patient, patient endorses that there is still something in her foot, discussed with patient that push foreign object out if present at all, endorses that she wants it removed, given walking referral to podiatry for further evaluation, prescribed bacitracin ointment for daily use until area closes to prevent infection, advised RICE until pain dissipates Final Clinical Impressions(s) / UC Diagnoses   Final diagnoses:  Acute foot pain, right     Discharge  Instructions      The x-ray today did not show a foreign body in your foot, symptoms should improve with time, your pain in your foot may take a few days to get better as it is related to the injury as well as the attempt to remove the glass  You may use Tylenol 500 mg every 6 hours as needed for comfort  You may follow-up with podiatry for reevaluation, information for the specialist as listed below  It is possible that with time your body push the glass out of your foot if still there  Cleanse foot as normal, pat dry and apply back to  trace and ointment once a day until area has closed  Please do not attempt to do a glass out of fluid as this can cause complications   ED Prescriptions     Medication Sig Dispense Auth. Provider   mupirocin cream (BACTROBAN) 2 % Apply 1 application topically daily. 15 g Hans Eden, NP      PDMP not reviewed this encounter.   Hans Eden, NP 11/11/21 [phone]

## 2021-11-11 NOTE — Discharge Instructions (Addendum)
The x-ray today did not show a foreign body in your foot, symptoms should improve with time, your pain in your foot may take a few days to get better as it is related to the injury as well as the attempt to remove the glass  You may use Tylenol 500 mg every 6 hours as needed for comfort  You may follow-up with podiatry for reevaluation, information for the specialist as listed below  It is possible that with time your body push the glass out of your foot if still there  Cleanse foot as normal, pat dry and apply back to trace and ointment once a day until area has closed  Please do not attempt to do a glass out of fluid as this can cause complications

## 2021-11-12 ENCOUNTER — Ambulatory Visit (HOSPITAL_COMMUNITY): Payer: Medicare HMO | Attending: Cardiovascular Disease

## 2021-11-12 DIAGNOSIS — N2581 Secondary hyperparathyroidism of renal origin: Secondary | ICD-10-CM | POA: Diagnosis not present

## 2021-11-12 DIAGNOSIS — E1100 Type 2 diabetes mellitus with hyperosmolarity without nonketotic hyperglycemic-hyperosmolar coma (NKHHC): Secondary | ICD-10-CM | POA: Insufficient documentation

## 2021-11-12 DIAGNOSIS — D509 Iron deficiency anemia, unspecified: Secondary | ICD-10-CM | POA: Diagnosis not present

## 2021-11-12 DIAGNOSIS — I10 Essential (primary) hypertension: Secondary | ICD-10-CM | POA: Insufficient documentation

## 2021-11-12 DIAGNOSIS — N186 End stage renal disease: Secondary | ICD-10-CM | POA: Diagnosis not present

## 2021-11-12 DIAGNOSIS — I5032 Chronic diastolic (congestive) heart failure: Secondary | ICD-10-CM | POA: Insufficient documentation

## 2021-11-12 DIAGNOSIS — I5033 Acute on chronic diastolic (congestive) heart failure: Secondary | ICD-10-CM | POA: Diagnosis not present

## 2021-11-12 DIAGNOSIS — E785 Hyperlipidemia, unspecified: Secondary | ICD-10-CM | POA: Insufficient documentation

## 2021-11-12 DIAGNOSIS — I110 Hypertensive heart disease with heart failure: Secondary | ICD-10-CM | POA: Diagnosis not present

## 2021-11-12 DIAGNOSIS — Z992 Dependence on renal dialysis: Secondary | ICD-10-CM | POA: Diagnosis not present

## 2021-11-12 DIAGNOSIS — D631 Anemia in chronic kidney disease: Secondary | ICD-10-CM | POA: Diagnosis not present

## 2021-11-12 LAB — ECHOCARDIOGRAM COMPLETE
AR max vel: 1.68 cm2
AV Area VTI: 1.66 cm2
AV Area mean vel: 1.59 cm2
AV Mean grad: 11 mmHg
AV Peak grad: 19.7 mmHg
Ao pk vel: 2.22 m/s
Area-P 1/2: 2.9 cm2
S' Lateral: 3.3 cm

## 2021-11-15 DIAGNOSIS — D631 Anemia in chronic kidney disease: Secondary | ICD-10-CM | POA: Diagnosis not present

## 2021-11-15 DIAGNOSIS — N2581 Secondary hyperparathyroidism of renal origin: Secondary | ICD-10-CM | POA: Diagnosis not present

## 2021-11-15 DIAGNOSIS — N186 End stage renal disease: Secondary | ICD-10-CM | POA: Diagnosis not present

## 2021-11-15 DIAGNOSIS — D509 Iron deficiency anemia, unspecified: Secondary | ICD-10-CM | POA: Diagnosis not present

## 2021-11-18 DIAGNOSIS — N186 End stage renal disease: Secondary | ICD-10-CM | POA: Diagnosis not present

## 2021-11-18 DIAGNOSIS — D631 Anemia in chronic kidney disease: Secondary | ICD-10-CM | POA: Diagnosis not present

## 2021-11-18 DIAGNOSIS — D509 Iron deficiency anemia, unspecified: Secondary | ICD-10-CM | POA: Diagnosis not present

## 2021-11-18 DIAGNOSIS — N2581 Secondary hyperparathyroidism of renal origin: Secondary | ICD-10-CM | POA: Diagnosis not present

## 2021-11-20 DIAGNOSIS — N186 End stage renal disease: Secondary | ICD-10-CM | POA: Diagnosis not present

## 2021-11-20 DIAGNOSIS — N2581 Secondary hyperparathyroidism of renal origin: Secondary | ICD-10-CM | POA: Diagnosis not present

## 2021-11-20 DIAGNOSIS — I10 Essential (primary) hypertension: Secondary | ICD-10-CM | POA: Diagnosis not present

## 2021-11-20 DIAGNOSIS — D631 Anemia in chronic kidney disease: Secondary | ICD-10-CM | POA: Diagnosis not present

## 2021-11-20 DIAGNOSIS — D509 Iron deficiency anemia, unspecified: Secondary | ICD-10-CM | POA: Diagnosis not present

## 2021-11-22 DIAGNOSIS — D631 Anemia in chronic kidney disease: Secondary | ICD-10-CM | POA: Diagnosis not present

## 2021-11-22 DIAGNOSIS — N2581 Secondary hyperparathyroidism of renal origin: Secondary | ICD-10-CM | POA: Diagnosis not present

## 2021-11-22 DIAGNOSIS — N186 End stage renal disease: Secondary | ICD-10-CM | POA: Diagnosis not present

## 2021-11-22 DIAGNOSIS — D509 Iron deficiency anemia, unspecified: Secondary | ICD-10-CM | POA: Diagnosis not present

## 2021-11-25 DIAGNOSIS — N186 End stage renal disease: Secondary | ICD-10-CM | POA: Diagnosis not present

## 2021-11-25 DIAGNOSIS — D509 Iron deficiency anemia, unspecified: Secondary | ICD-10-CM | POA: Diagnosis not present

## 2021-11-25 DIAGNOSIS — D631 Anemia in chronic kidney disease: Secondary | ICD-10-CM | POA: Diagnosis not present

## 2021-11-25 DIAGNOSIS — N2581 Secondary hyperparathyroidism of renal origin: Secondary | ICD-10-CM | POA: Diagnosis not present

## 2021-11-26 ENCOUNTER — Telehealth: Payer: Self-pay | Admitting: Internal Medicine

## 2021-11-26 NOTE — Telephone Encounter (Signed)
Echo results:  I reviewed the patient's TTE from last month with Dr. Owens Shark of the imaging section.  Mobile element seen on mitral valve likely due to mitral annular calcification.  She has had no signs or symptoms suggestive of infection.  If she has had any fevers etc recently, I would get a TEE.  Please inform patient (I tried calling, no answer and no ability to leave VM).

## 2021-11-26 NOTE — Telephone Encounter (Signed)
Left message for patient to call back  

## 2021-11-27 DIAGNOSIS — D509 Iron deficiency anemia, unspecified: Secondary | ICD-10-CM | POA: Diagnosis not present

## 2021-11-27 DIAGNOSIS — N2581 Secondary hyperparathyroidism of renal origin: Secondary | ICD-10-CM | POA: Diagnosis not present

## 2021-11-27 DIAGNOSIS — N186 End stage renal disease: Secondary | ICD-10-CM | POA: Diagnosis not present

## 2021-11-27 DIAGNOSIS — D631 Anemia in chronic kidney disease: Secondary | ICD-10-CM | POA: Diagnosis not present

## 2021-11-28 ENCOUNTER — Ambulatory Visit: Payer: Managed Care, Other (non HMO) | Admitting: Cardiology

## 2021-11-29 DIAGNOSIS — N186 End stage renal disease: Secondary | ICD-10-CM | POA: Diagnosis not present

## 2021-11-29 DIAGNOSIS — N2581 Secondary hyperparathyroidism of renal origin: Secondary | ICD-10-CM | POA: Diagnosis not present

## 2021-11-29 DIAGNOSIS — D631 Anemia in chronic kidney disease: Secondary | ICD-10-CM | POA: Diagnosis not present

## 2021-11-29 DIAGNOSIS — D509 Iron deficiency anemia, unspecified: Secondary | ICD-10-CM | POA: Diagnosis not present

## 2021-12-01 NOTE — Telephone Encounter (Signed)
Patient was returning call. Please advise ?

## 2021-12-01 NOTE — Telephone Encounter (Signed)
Left message for patient to call back  

## 2021-12-02 DIAGNOSIS — N2581 Secondary hyperparathyroidism of renal origin: Secondary | ICD-10-CM | POA: Diagnosis not present

## 2021-12-02 DIAGNOSIS — D631 Anemia in chronic kidney disease: Secondary | ICD-10-CM | POA: Diagnosis not present

## 2021-12-02 DIAGNOSIS — N186 End stage renal disease: Secondary | ICD-10-CM | POA: Diagnosis not present

## 2021-12-02 DIAGNOSIS — D509 Iron deficiency anemia, unspecified: Secondary | ICD-10-CM | POA: Diagnosis not present

## 2021-12-03 ENCOUNTER — Other Ambulatory Visit: Payer: Medicare HMO | Admitting: *Deleted

## 2021-12-03 ENCOUNTER — Other Ambulatory Visit: Payer: Self-pay

## 2021-12-03 DIAGNOSIS — I5033 Acute on chronic diastolic (congestive) heart failure: Secondary | ICD-10-CM | POA: Diagnosis not present

## 2021-12-03 DIAGNOSIS — N186 End stage renal disease: Secondary | ICD-10-CM | POA: Diagnosis not present

## 2021-12-03 DIAGNOSIS — I5032 Chronic diastolic (congestive) heart failure: Secondary | ICD-10-CM | POA: Diagnosis not present

## 2021-12-03 DIAGNOSIS — I110 Hypertensive heart disease with heart failure: Secondary | ICD-10-CM | POA: Diagnosis not present

## 2021-12-03 DIAGNOSIS — E1100 Type 2 diabetes mellitus with hyperosmolarity without nonketotic hyperglycemic-hyperosmolar coma (NKHHC): Secondary | ICD-10-CM

## 2021-12-03 DIAGNOSIS — I10 Essential (primary) hypertension: Secondary | ICD-10-CM

## 2021-12-03 DIAGNOSIS — Z992 Dependence on renal dialysis: Secondary | ICD-10-CM | POA: Diagnosis not present

## 2021-12-03 DIAGNOSIS — E785 Hyperlipidemia, unspecified: Secondary | ICD-10-CM | POA: Diagnosis not present

## 2021-12-03 LAB — COMPREHENSIVE METABOLIC PANEL
ALT: 14 IU/L (ref 0–32)
AST: 19 IU/L (ref 0–40)
Albumin/Globulin Ratio: 1.3 (ref 1.2–2.2)
Albumin: 4.2 g/dL (ref 3.8–4.9)
Alkaline Phosphatase: 219 IU/L — ABNORMAL HIGH (ref 44–121)
BUN/Creatinine Ratio: 4 — ABNORMAL LOW (ref 9–23)
BUN: 35 mg/dL — ABNORMAL HIGH (ref 6–24)
Bilirubin Total: 0.3 mg/dL (ref 0.0–1.2)
CO2: 25 mmol/L (ref 20–29)
Calcium: 9 mg/dL (ref 8.7–10.2)
Chloride: 98 mmol/L (ref 96–106)
Creatinine, Ser: 8.45 mg/dL — ABNORMAL HIGH (ref 0.57–1.00)
Globulin, Total: 3.3 g/dL (ref 1.5–4.5)
Glucose: 123 mg/dL — ABNORMAL HIGH (ref 70–99)
Potassium: 4.6 mmol/L (ref 3.5–5.2)
Sodium: 142 mmol/L (ref 134–144)
Total Protein: 7.5 g/dL (ref 6.0–8.5)
eGFR: 5 mL/min/{1.73_m2} — ABNORMAL LOW (ref >59)

## 2021-12-03 LAB — LIPID PANEL
Chol/HDL Ratio: 2.1 ratio (ref 0.0–4.4)
Cholesterol, Total: 118 mg/dL (ref 100–199)
HDL: 55 mg/dL (ref >39)
LDL Chol Calc (NIH): 41 mg/dL (ref 0–99)
Triglycerides: 127 mg/dL (ref 0–149)
VLDL Cholesterol Cal: 22 mg/dL (ref 5–40)

## 2021-12-04 DIAGNOSIS — N186 End stage renal disease: Secondary | ICD-10-CM | POA: Diagnosis not present

## 2021-12-04 DIAGNOSIS — D631 Anemia in chronic kidney disease: Secondary | ICD-10-CM | POA: Diagnosis not present

## 2021-12-04 DIAGNOSIS — N2581 Secondary hyperparathyroidism of renal origin: Secondary | ICD-10-CM | POA: Diagnosis not present

## 2021-12-04 DIAGNOSIS — D509 Iron deficiency anemia, unspecified: Secondary | ICD-10-CM | POA: Diagnosis not present

## 2021-12-06 DIAGNOSIS — D509 Iron deficiency anemia, unspecified: Secondary | ICD-10-CM | POA: Diagnosis not present

## 2021-12-06 DIAGNOSIS — N2581 Secondary hyperparathyroidism of renal origin: Secondary | ICD-10-CM | POA: Diagnosis not present

## 2021-12-06 DIAGNOSIS — D631 Anemia in chronic kidney disease: Secondary | ICD-10-CM | POA: Diagnosis not present

## 2021-12-06 DIAGNOSIS — N186 End stage renal disease: Secondary | ICD-10-CM | POA: Diagnosis not present

## 2021-12-08 NOTE — Telephone Encounter (Signed)
Left message that I have been calling to speak w her about her results and that I would send the message through to her in [place].  Adv that she call back with any questions or concerns.

## 2021-12-09 DIAGNOSIS — N186 End stage renal disease: Secondary | ICD-10-CM | POA: Diagnosis not present

## 2021-12-09 DIAGNOSIS — N2581 Secondary hyperparathyroidism of renal origin: Secondary | ICD-10-CM | POA: Diagnosis not present

## 2021-12-09 DIAGNOSIS — D509 Iron deficiency anemia, unspecified: Secondary | ICD-10-CM | POA: Diagnosis not present

## 2021-12-09 DIAGNOSIS — D631 Anemia in chronic kidney disease: Secondary | ICD-10-CM | POA: Diagnosis not present

## 2021-12-11 DIAGNOSIS — D509 Iron deficiency anemia, unspecified: Secondary | ICD-10-CM | POA: Diagnosis not present

## 2021-12-11 DIAGNOSIS — N186 End stage renal disease: Secondary | ICD-10-CM | POA: Diagnosis not present

## 2021-12-11 DIAGNOSIS — N2581 Secondary hyperparathyroidism of renal origin: Secondary | ICD-10-CM | POA: Diagnosis not present

## 2021-12-11 DIAGNOSIS — D631 Anemia in chronic kidney disease: Secondary | ICD-10-CM | POA: Diagnosis not present

## 2021-12-13 DIAGNOSIS — D631 Anemia in chronic kidney disease: Secondary | ICD-10-CM | POA: Diagnosis not present

## 2021-12-13 DIAGNOSIS — D509 Iron deficiency anemia, unspecified: Secondary | ICD-10-CM | POA: Diagnosis not present

## 2021-12-13 DIAGNOSIS — N186 End stage renal disease: Secondary | ICD-10-CM | POA: Diagnosis not present

## 2021-12-13 DIAGNOSIS — N2581 Secondary hyperparathyroidism of renal origin: Secondary | ICD-10-CM | POA: Diagnosis not present

## 2021-12-16 DIAGNOSIS — D631 Anemia in chronic kidney disease: Secondary | ICD-10-CM | POA: Diagnosis not present

## 2021-12-16 DIAGNOSIS — N2581 Secondary hyperparathyroidism of renal origin: Secondary | ICD-10-CM | POA: Diagnosis not present

## 2021-12-16 DIAGNOSIS — D509 Iron deficiency anemia, unspecified: Secondary | ICD-10-CM | POA: Diagnosis not present

## 2021-12-16 DIAGNOSIS — N186 End stage renal disease: Secondary | ICD-10-CM | POA: Diagnosis not present

## 2021-12-18 DIAGNOSIS — D631 Anemia in chronic kidney disease: Secondary | ICD-10-CM | POA: Diagnosis not present

## 2021-12-18 DIAGNOSIS — N2581 Secondary hyperparathyroidism of renal origin: Secondary | ICD-10-CM | POA: Diagnosis not present

## 2021-12-18 DIAGNOSIS — D509 Iron deficiency anemia, unspecified: Secondary | ICD-10-CM | POA: Diagnosis not present

## 2021-12-18 DIAGNOSIS — N186 End stage renal disease: Secondary | ICD-10-CM | POA: Diagnosis not present

## 2021-12-20 DIAGNOSIS — N186 End stage renal disease: Secondary | ICD-10-CM | POA: Diagnosis not present

## 2021-12-20 DIAGNOSIS — N2581 Secondary hyperparathyroidism of renal origin: Secondary | ICD-10-CM | POA: Diagnosis not present

## 2021-12-20 DIAGNOSIS — D631 Anemia in chronic kidney disease: Secondary | ICD-10-CM | POA: Diagnosis not present

## 2021-12-20 DIAGNOSIS — D509 Iron deficiency anemia, unspecified: Secondary | ICD-10-CM | POA: Diagnosis not present

## 2022-06-10 ENCOUNTER — Encounter: Payer: Self-pay | Admitting: Internal Medicine

## 2022-08-04 NOTE — Progress Notes (Unsigned)
Cardiology Office Note:    Date:  08/05/2022   ID:  [name], DOB Oct 01, 1961, MRN [id number]  PCP:  Caren Macadam, [place] [facility] Cardiologist:  Early Osmond, MD    Referring MD: Caren Macadam, MD   Chief Complaint:  Follow-up for CHF, CAD    Patient Profile: (HFpEF) heart failure with preserved ejection fraction  Coronary artery disease  Mild non-obs CAD by cath in 2017 Hypertension Diabetes mellitus ESRD Hemodialysis Tuesday, Thursday, Saturday Obesity Hyperlipidemia  Prior CV Studies: ECHO COMPLETE WO IMAGING ENHANCING AGENT 11/12/2021 EF 58, no RWMA, moderate concentric LVH, normal RVSF, normal PASP, calcified slightly mobile mass on the atrial aspect of the mitral valve-reviewed but Dr. For Marlowe Kays and felt to be calcification (no TEE needed), AV calcification, no aortic stenosis, RVSP 28.2  LEFT HEART CATH AND CORONARY ANGIOGRAPHY 11/02/2016 1. Mild nonobstructive coronary artery disease 2. Elevated LVEDP    NM Myocar Multi W/Spect W/Wall Motion / EF 10/26/2016 Intermediate risk, equivocal study. There is a reversible mid anterior wall defect that may represent shifting breast attenuation artifact, but ischemia in a branch of the LAD artery cannot be confidently excluded. Borderline reduced left ventricular systolic function.  EF 52.  Abnormal ECG at baseline.     History of Present Illness:   Pamela Campbell is a 61 y.o. female with the above problem list.  She was last seen by Dr. Ali Lowe in November 2022.  Losartan was added to her medical regimen and her amlodipine was increased due to uncontrolled blood pressure.    She returns for follow-up.  She is here alone.  She has not had chest pain.  She does note chronic shortness of breath.  This is largely unchanged for quite some time.  She sleeps on 2 pillows chronically.  She has some leg edema.  She has not had syncope.    Past Medical History:  Diagnosis Date   Anemia    Chronic  diastolic CHF (congestive heart failure) ([place])    a. 03/2015: echo w/ EF of 50-55%, no WMA, Grade 2 DD, trivial AI, mild MR.   Chronic kidney disease (CKD), stage V ([place])    dialysis tuesday, thursday saterday   Constipation    Diabetes mellitus without complication ([place])    CONTROLLED WITH DIET   Dyspnea    non lately   Edema, lower extremity    Hypertension    Overweight    Pneumonia 2016   WHILE HOSPIATLIZED   Shortness of breath    Current Medications: Current Meds  Medication Sig   amLODipine (NORVASC) 10 MG tablet Take 1 tablet (10 mg total) by mouth daily.   aspirin EC 81 MG tablet Take 1 tablet (81 mg total) by mouth daily. Swallow whole.   losartan (COZAAR) 25 MG tablet Take 1/2 tablet by mouth in the morning and in the evening   ondansetron (ZOFRAN) 4 MG tablet Take 4 mg by mouth every 8 (eight) hours as needed for nausea or vomiting.   [START ON 08/06/2022] rosuvastatin (CRESTOR) 5 MG tablet Take 1 tablet (5 mg total) by mouth 2 (two) times a week.    Allergies:   Compazine [prochlorperazine edisylate], Nsaids, Sensipar [cinacalcet], Vioxx [rofecoxib], and Other   Social History   Tobacco Use   Smoking status: Former    Types: Cigarettes    Quit date: 03/21/1998    Years since quitting: 24.3   Smokeless tobacco: Never  Vaping Use   Vaping Use:  Never used  Substance Use Topics   Alcohol use: No   Drug use: No    Family Hx: The patient's family history includes Congestive Heart Failure in her brother and son; Diabetes Mellitus II in her mother; Eating disorder in her mother; Heart attack (age of onset: 17) in her father; Heart disease in her father; Hyperlipidemia in her mother; Hypertension in her mother; Kidney disease in her mother; Obesity in her mother.  Review of Systems  Gastrointestinal:  Negative for hematochezia.  Genitourinary:  Negative for hematuria.     EKGs/Labs/Other Test Reviewed:    EKG:  EKG is not ordered today.  The ekg ordered today  demonstrates n/a  Recent Labs: 12/03/2021: ALT 14; BUN 35; Creatinine, Ser 8.45; Potassium 4.6; Sodium 142   Recent Lipid Panel Recent Labs    12/03/21 0946  CHOL 118  TRIG 127  HDL 55  LDLCALC 41     Risk Assessment/Calculations/Metrics:              Physical Exam:    VS:  BP 138/78   Pulse 80   Ht 5\' 4"  (1.626 m)   Wt 270 lb (122.5 kg)   LMP 02/02/2015   SpO2 96%   BMI 46.35 kg/m     Wt Readings from Last 3 Encounters:  08/05/22 270 lb (122.5 kg)  10/22/21 271 lb 12.8 oz (123.3 kg)  07/27/21 273 lb 3.2 oz (123.9 kg)    Constitutional:      Appearance: Healthy appearance. Not in distress.  Neck:     Vascular: JVD normal.  Pulmonary:     Effort: Pulmonary effort is normal.     Breath sounds: No wheezing. No rales.  Cardiovascular:     Normal rate. Regular rhythm. Normal S1. Normal S2.      Murmurs: There is a grade 2/6 systolic murmur at the URSB.  Edema:    Peripheral edema absent.  Abdominal:     Palpations: Abdomen is soft.  Skin:    General: Skin is warm and dry.  Neurological:     Mental Status: Alert and oriented to person, place and time.          ASSESSMENT & PLAN:   (HFpEF) heart failure with preserved ejection fraction (HCC) Volume management per dialysis.  She was given metolazone at last visit her nondialysis days.  However, this did not increase urinary output.  Minor CAD  Cardiac catheterization in 2017 with minimal plaque.  She is not having anginal symptoms.  Continue aspirin 81 mg daily.  She could not tolerate Lipitor due to nausea and vomiting.  Her most recent LDL was optimal without medical therapy.  As she is a diabetic and has known CAD, she would benefit from statin therapy.  I have recommended that she try rosuvastatin 5 mg twice weekly to see if she can tolerate this.  Hypertensive heart disease Overall, blood pressure is well controlled.  Continue amlodipine 10 mg daily, losartan 12.5 mg twice daily.  Pure  hypercholesterolemia As noted, she cannot tolerate atorvastatin secondary to nausea and vomiting.  Start rosuvastatin 5 mg twice weekly.  Obtain fasting lipids and LFTs in 3 months.  ESRD on dialysis [facility]) She is on dialysis every Tuesday, Thursday, Saturday.          Dispo:  Return in about 6 months (around 02/05/2023) for Routine Follow Up, w/ Dr. Ali Lowe, or Richardson Dopp, PA-C.   Medication Adjustments/Labs and Tests Ordered: Current medicines are reviewed at length with  the patient today.  Concerns regarding medicines are outlined above.  Tests Ordered: Orders Placed This Encounter  Procedures   Lipid panel   Hepatic function panel   Medication Changes: Meds ordered this encounter  Medications   rosuvastatin (CRESTOR) 5 MG tablet    Sig: Take 1 tablet (5 mg total) by mouth 2 (two) times a week.    Dispense:  25 tablet    Refill:  3   Signed, Richardson Dopp, PA-C  08/05/2022 5:32 PM    [address], [place],   [address] Phone: [phone]; Fax: [phone]

## 2022-08-05 ENCOUNTER — Encounter: Payer: Self-pay | Admitting: Physician Assistant

## 2022-08-05 ENCOUNTER — Ambulatory Visit (INDEPENDENT_AMBULATORY_CARE_PROVIDER_SITE_OTHER): Payer: Medicare HMO | Admitting: Physician Assistant

## 2022-08-05 VITALS — BP 138/78 | HR 80 | Ht 64.0 in | Wt 270.0 lb

## 2022-08-05 DIAGNOSIS — E7800 Pure hypercholesterolemia, unspecified: Secondary | ICD-10-CM

## 2022-08-05 DIAGNOSIS — I5032 Chronic diastolic (congestive) heart failure: Secondary | ICD-10-CM | POA: Diagnosis not present

## 2022-08-05 DIAGNOSIS — N186 End stage renal disease: Secondary | ICD-10-CM

## 2022-08-05 DIAGNOSIS — I110 Hypertensive heart disease with heart failure: Secondary | ICD-10-CM | POA: Diagnosis not present

## 2022-08-05 DIAGNOSIS — I2510 Atherosclerotic heart disease of native coronary artery without angina pectoris: Secondary | ICD-10-CM

## 2022-08-05 DIAGNOSIS — Z992 Dependence on renal dialysis: Secondary | ICD-10-CM

## 2022-08-05 MED ORDER — ROSUVASTATIN CALCIUM 5 MG PO TABS: 5.0000 mg | ORAL_TABLET | ORAL | 3 refills | Status: DC

## 2022-08-05 NOTE — Assessment & Plan Note (Signed)
As noted, she cannot tolerate atorvastatin secondary to nausea and vomiting.  Start rosuvastatin 5 mg twice weekly.  Obtain fasting lipids and LFTs in 3 months.

## 2022-08-05 NOTE — Assessment & Plan Note (Signed)
Overall, blood pressure is well controlled.  Continue amlodipine 10 mg daily, losartan 12.5 mg twice daily.

## 2022-08-05 NOTE — Patient Instructions (Signed)
Medication Instructions:  Your physician has recommended you make the following change in your medication:   START Crestor (Rosuvastatin) 5 mg taking 1 tablet 2 times a week  *If you need a refill on your cardiac medications before your next appointment, please call your pharmacy*   Lab Work: 3 MONTHS:  FASTING LIPID & LFT  If you have labs (blood work) drawn today and your tests are completely normal, you will receive your results only by: [place] (if you have MyChart) OR A paper copy in the mail If you have any lab test that is abnormal or we need to change your treatment, we will call you to review the results.   Testing/Procedures: None ordered   Follow-Up: At [facility], you and your health needs are our priority.  As part of our continuing mission to provide you with exceptional heart care, we have created designated Provider Care Teams.  These Care Teams include your primary Cardiologist (physician) and Advanced Practice Providers (APPs -  Physician Assistants and Nurse Practitioners) who all work together to provide you with the care you need, when you need it.  We recommend signing up for the patient portal called "MyChart".  Sign up information is provided on this After Visit Summary.  MyChart is used to connect with patients for Virtual Visits (Telemedicine).  Patients are able to view lab/test results, encounter notes, upcoming appointments, etc.  Non-urgent messages can be sent to your provider as well.   To learn more about what you can do with MyChart, go to [email or web address].    Your next appointment:   6 month(s)  The format for your next appointment:   In Person  Provider:   Early Osmond, MD  or Richardson Dopp, PA-C         Other Instructions   Important Information About Sugar

## 2022-08-05 NOTE — Assessment & Plan Note (Signed)
She is on dialysis every Tuesday, Thursday, Saturday.

## 2022-08-05 NOTE — Assessment & Plan Note (Signed)
Volume management per dialysis.  She was given metolazone at last visit her nondialysis days.  However, this did not increase urinary output.

## 2022-08-05 NOTE — Assessment & Plan Note (Signed)
Cardiac catheterization in 2017 with minimal plaque.  She is not having anginal symptoms.  Continue aspirin 81 mg daily.  She could not tolerate Lipitor due to nausea and vomiting.  Her most recent LDL was optimal without medical therapy.  As she is a diabetic and has known CAD, she would benefit from statin therapy.  I have recommended that she try rosuvastatin 5 mg twice weekly to see if she can tolerate this.

## 2022-09-01 ENCOUNTER — Emergency Department (HOSPITAL_COMMUNITY): Payer: Medicare HMO

## 2022-09-01 ENCOUNTER — Observation Stay (HOSPITAL_COMMUNITY)
Admission: EM | Admit: 2022-09-01 | Discharge: 2022-09-02 | Disposition: A | Discharge status: Home / Self Care | Payer: Medicare HMO | Attending: Emergency Medicine | Admitting: Emergency Medicine

## 2022-09-01 ENCOUNTER — Other Ambulatory Visit: Payer: Self-pay

## 2022-09-01 DIAGNOSIS — J9601 Acute respiratory failure with hypoxia: Secondary | ICD-10-CM | POA: Diagnosis not present

## 2022-09-01 DIAGNOSIS — I160 Hypertensive urgency: Secondary | ICD-10-CM | POA: Diagnosis not present

## 2022-09-01 DIAGNOSIS — N186 End stage renal disease: Secondary | ICD-10-CM | POA: Diagnosis not present

## 2022-09-01 DIAGNOSIS — I132 Hypertensive heart and chronic kidney disease with heart failure and with stage 5 chronic kidney disease, or end stage renal disease: Secondary | ICD-10-CM | POA: Diagnosis not present

## 2022-09-01 DIAGNOSIS — Z7982 Long term (current) use of aspirin: Secondary | ICD-10-CM | POA: Insufficient documentation

## 2022-09-01 DIAGNOSIS — Z87891 Personal history of nicotine dependence: Secondary | ICD-10-CM | POA: Diagnosis not present

## 2022-09-01 DIAGNOSIS — D72829 Elevated white blood cell count, unspecified: Secondary | ICD-10-CM | POA: Insufficient documentation

## 2022-09-01 DIAGNOSIS — E875 Hyperkalemia: Secondary | ICD-10-CM | POA: Diagnosis not present

## 2022-09-01 DIAGNOSIS — E1122 Type 2 diabetes mellitus with diabetic chronic kidney disease: Secondary | ICD-10-CM | POA: Diagnosis not present

## 2022-09-01 DIAGNOSIS — E119 Type 2 diabetes mellitus without complications: Secondary | ICD-10-CM

## 2022-09-01 DIAGNOSIS — I5032 Chronic diastolic (congestive) heart failure: Secondary | ICD-10-CM | POA: Insufficient documentation

## 2022-09-01 DIAGNOSIS — Z79899 Other long term (current) drug therapy: Secondary | ICD-10-CM | POA: Insufficient documentation

## 2022-09-01 DIAGNOSIS — Z992 Dependence on renal dialysis: Secondary | ICD-10-CM | POA: Insufficient documentation

## 2022-09-01 DIAGNOSIS — I161 Hypertensive emergency: Secondary | ICD-10-CM | POA: Insufficient documentation

## 2022-09-01 DIAGNOSIS — D638 Anemia in other chronic diseases classified elsewhere: Secondary | ICD-10-CM | POA: Diagnosis present

## 2022-09-01 DIAGNOSIS — E1100 Type 2 diabetes mellitus with hyperosmolarity without nonketotic hyperglycemic-hyperosmolar coma (NKHHC): Secondary | ICD-10-CM

## 2022-09-01 DIAGNOSIS — D631 Anemia in chronic kidney disease: Secondary | ICD-10-CM | POA: Diagnosis not present

## 2022-09-01 DIAGNOSIS — R531 Weakness: Secondary | ICD-10-CM | POA: Diagnosis present

## 2022-09-01 DIAGNOSIS — E8770 Fluid overload, unspecified: Secondary | ICD-10-CM | POA: Diagnosis not present

## 2022-09-01 LAB — CBC WITH DIFFERENTIAL/PLATELET
Abs Immature Granulocytes: 0.05 10*3/uL (ref 0.00–0.07)
Basophils Absolute: 0.1 10*3/uL (ref 0.0–0.1)
Basophils Relative: 0 %
Eosinophils Absolute: 0.3 10*3/uL (ref 0.0–0.5)
Eosinophils Relative: 2 %
HCT: 35.2 % — ABNORMAL LOW (ref 36.0–46.0)
Hemoglobin: 11.4 g/dL — ABNORMAL LOW (ref 12.0–15.0)
Immature Granulocytes: 0 %
Lymphocytes Relative: 11 %
Lymphs Abs: 1.6 10*3/uL (ref 0.7–4.0)
MCH: 31.5 pg (ref 26.0–34.0)
MCHC: 32.4 g/dL (ref 30.0–36.0)
MCV: 97.2 fL (ref 80.0–100.0)
Monocytes Absolute: 1 10*3/uL (ref 0.1–1.0)
Monocytes Relative: 7 %
Neutro Abs: 11.8 10*3/uL — ABNORMAL HIGH (ref 1.7–7.7)
Neutrophils Relative %: 80 %
Platelets: 294 10*3/uL (ref 150–400)
RBC: 3.62 MIL/uL — ABNORMAL LOW (ref 3.87–5.11)
RDW: 14.6 % (ref 11.5–15.5)
WBC: 14.8 10*3/uL — ABNORMAL HIGH (ref 4.0–10.5)
nRBC: 0 % (ref 0.0–0.2)

## 2022-09-01 LAB — TROPONIN I (HIGH SENSITIVITY)
Troponin I (High Sensitivity): 57 ng/L — ABNORMAL HIGH (ref <18)
Troponin I (High Sensitivity): 60 ng/L — ABNORMAL HIGH (ref <18)

## 2022-09-01 LAB — COMPREHENSIVE METABOLIC PANEL
ALT: 24 U/L (ref 0–44)
AST: 23 U/L (ref 15–41)
Albumin: 3.6 g/dL (ref 3.5–5.0)
Alkaline Phosphatase: 115 U/L (ref 38–126)
Anion gap: 14 (ref 5–15)
BUN: 77 mg/dL — ABNORMAL HIGH (ref 6–20)
CO2: 24 mmol/L (ref 22–32)
Calcium: 9.1 mg/dL (ref 8.9–10.3)
Chloride: 102 mmol/L (ref 98–111)
Creatinine, Ser: 13.2 mg/dL — ABNORMAL HIGH (ref 0.44–1.00)
GFR, Estimated: 3 mL/min — ABNORMAL LOW (ref >60)
Glucose, Bld: 123 mg/dL — ABNORMAL HIGH (ref 70–99)
Potassium: 6.3 mmol/L (ref 3.5–5.1)
Sodium: 140 mmol/L (ref 135–145)
Total Bilirubin: 0.8 mg/dL (ref 0.3–1.2)
Total Protein: 7.7 g/dL (ref 6.5–8.1)

## 2022-09-01 LAB — HEPATITIS B SURFACE ANTIGEN: Hepatitis B Surface Ag: NONREACTIVE

## 2022-09-01 LAB — HEPATITIS B SURFACE ANTIBODY,QUALITATIVE: Hep B S Ab: NONREACTIVE

## 2022-09-01 LAB — BRAIN NATRIURETIC PEPTIDE: B Natriuretic Peptide: 508.3 pg/mL — ABNORMAL HIGH (ref 0.0–100.0)

## 2022-09-01 LAB — HEPATITIS B CORE ANTIBODY, TOTAL: Hep B Core Total Ab: NONREACTIVE

## 2022-09-01 LAB — HEPATITIS C ANTIBODY: HCV Ab: NONREACTIVE

## 2022-09-01 MED ORDER — SODIUM CHLORIDE 0.9% FLUSH
3.0000 mL | Freq: Two times a day (BID) | INTRAVENOUS | Status: DC
  Administered 2022-09-01 – 2022-09-02 (×2): 3 mL via INTRAVENOUS

## 2022-09-01 MED ORDER — AMLODIPINE BESYLATE 5 MG PO TABS
5.0000 mg | ORAL_TABLET | Freq: Two times a day (BID) | ORAL | Status: DC
  Administered 2022-09-01 – 2022-09-02 (×2): 5 mg via ORAL
  Filled 2022-09-01 (×2): qty 1

## 2022-09-01 MED ORDER — ALBUTEROL SULFATE (2.5 MG/3ML) 0.083% IN NEBU: 2.5000 mg | INHALATION_SOLUTION | Freq: Four times a day (QID) | RESPIRATORY_TRACT | Status: DC | PRN

## 2022-09-01 MED ORDER — CHLORHEXIDINE GLUCONATE CLOTH 2 % EX PADS
6.0000 | MEDICATED_PAD | Freq: Every day | CUTANEOUS | Status: DC
  Administered 2022-09-02: 6 via TOPICAL

## 2022-09-01 MED ORDER — FUROSEMIDE 10 MG/ML IJ SOLN
40.0000 mg | Freq: Once | INTRAMUSCULAR | Status: AC
  Administered 2022-09-01: 40 mg via INTRAVENOUS
  Filled 2022-09-01: qty 4

## 2022-09-01 MED ORDER — SEVELAMER CARBONATE 800 MG PO TABS
2400.0000 mg | ORAL_TABLET | Freq: Three times a day (TID) | ORAL | Status: DC
  Administered 2022-09-02 (×2): 2400 mg via ORAL
  Filled 2022-09-01 (×2): qty 3

## 2022-09-01 MED ORDER — HEPARIN SODIUM (PORCINE) 5000 UNIT/ML IJ SOLN
5000.0000 [IU] | Freq: Three times a day (TID) | INTRAMUSCULAR | Status: DC
  Administered 2022-09-01 – 2022-09-02 (×2): 5000 [IU] via SUBCUTANEOUS
  Filled 2022-09-01 (×2): qty 1

## 2022-09-01 MED ORDER — ACETAMINOPHEN 325 MG PO TABS: 650.0000 mg | ORAL_TABLET | Freq: Four times a day (QID) | ORAL | Status: DC | PRN

## 2022-09-01 MED ORDER — SODIUM ZIRCONIUM CYCLOSILICATE 10 G PO PACK: 10.0000 g | PACK | Freq: Once | ORAL | Status: DC

## 2022-09-01 MED ORDER — INSULIN ASPART 100 UNIT/ML IV SOLN
5.0000 [IU] | Freq: Once | INTRAVENOUS | Status: AC
  Administered 2022-09-01: 5 [IU] via INTRAVENOUS

## 2022-09-01 MED ORDER — ONDANSETRON HCL 4 MG/2ML IJ SOLN: 4.0000 mg | Freq: Four times a day (QID) | INTRAMUSCULAR | Status: DC | PRN

## 2022-09-01 MED ORDER — ROSUVASTATIN CALCIUM 5 MG PO TABS
5.0000 mg | ORAL_TABLET | ORAL | Status: DC
  Administered 2022-09-02: 5 mg via ORAL
  Filled 2022-09-01: qty 1

## 2022-09-01 MED ORDER — ALBUTEROL SULFATE (2.5 MG/3ML) 0.083% IN NEBU
10.0000 mg | INHALATION_SOLUTION | Freq: Once | RESPIRATORY_TRACT | Status: AC
  Administered 2022-09-01: 10 mg via RESPIRATORY_TRACT
  Filled 2022-09-01: qty 12

## 2022-09-01 MED ORDER — ACETAMINOPHEN 650 MG RE SUPP: 650.0000 mg | Freq: Four times a day (QID) | RECTAL | Status: DC | PRN

## 2022-09-01 MED ORDER — DEXTROSE 50 % IV SOLN
1.0000 | Freq: Once | INTRAVENOUS | Status: AC
  Administered 2022-09-01: 50 mL via INTRAVENOUS
  Filled 2022-09-01: qty 50

## 2022-09-01 MED ORDER — ASPIRIN 81 MG PO TBEC
81.0000 mg | DELAYED_RELEASE_TABLET | Freq: Every day | ORAL | Status: DC
  Administered 2022-09-01 – 2022-09-02 (×2): 81 mg via ORAL
  Filled 2022-09-01 (×2): qty 1

## 2022-09-01 MED ORDER — NITROGLYCERIN 2 % TD OINT
0.5000 [in_us] | TOPICAL_OINTMENT | Freq: Once | TRANSDERMAL | Status: AC
  Administered 2022-09-01: 0.5 [in_us] via TOPICAL
  Filled 2022-09-01: qty 1

## 2022-09-01 MED ORDER — ONDANSETRON HCL 4 MG PO TABS: 4.0000 mg | ORAL_TABLET | Freq: Four times a day (QID) | ORAL | Status: DC | PRN

## 2022-09-01 NOTE — H&P (Signed)
History and Physical    Patient: Pamela Campbell XLK:[id number] DOB: 08-14-1961 DOA: 09/01/2022 DOS: the patient was seen and examined on 09/01/2022 PCP: Caren Macadam, MD  Patient coming from: Home  Chief Complaint:  Chief Complaint  Patient presents with   Weakness   HPI: Pamela Campbell is a 61 y.o. female with medical history significant of hypertension, diastolic CHF, ESRD on HD (TTS), diabetes mellitus 2 diet controlled, and obesity who presented with complaints of shortness of breath.  She had her normal hemodialysis session on 9/9, but reported having shortness of breath after it was complete.  Denies having any recent fever, chills, cough, abdominal pain, or vomiting.  Only associated symptoms that she reports is she has had some nausea and mild swelling of her legs.  Shortness of breath seem to worsen and this morning she called dialysis and told them that she was coming to the hospital due to her breathing.  About admission to the emergency department patient was noted to be afebrile with tachypnea, blood pressure elevated up to 218/96, and due to respiratory distress was placed on BiPAP.  Labs noted WBC 14.8, hemoglobin 11.4, potassium 6.3, BUN 77, creatinine 13.2, and high sensitive troponin 57.  Chest x-ray noted enlarged pericardial silhouette with pulmonary vascular congestion.  Patient had been given Lokelma 10 g p.o., furosemide 40 mg IV, albuterol 10 mg, insulin 5 units, and amp of D50.  Nephrology has been consulted and plan on dialyzing the patient.  Review of Systems: As mentioned in the history of present illness. All other systems reviewed and are negative. Past Medical History:  Diagnosis Date   Anemia    Chronic diastolic CHF (congestive heart failure) ([place])    a. 03/2015: echo w/ EF of 50-55%, no WMA, Grade 2 DD, trivial AI, mild MR.   Chronic kidney disease (CKD), stage V ([place])    dialysis tuesday, thursday saterday   Constipation    Diabetes mellitus without  complication ([place])    CONTROLLED WITH DIET   Dyspnea    non lately   Edema, lower extremity    Hypertension    Overweight    Pneumonia 2016   WHILE HOSPIATLIZED   Shortness of breath    Past Surgical History:  Procedure Laterality Date   AV FISTULA PLACEMENT Right 01/04/2018   Procedure: ARTERIOVENOUS (AV) FISTULA CREATION RIGHT ARM;  Surgeon: Conrad Duncan, MD;  Location: [facility];  Service: Vascular;  Laterality: Right;   AV FISTULA PLACEMENT Right 01/24/2018   Procedure: ARTERIOVENOUS (AV) FISTULA CREATION RIGHT BRACHIOCEPHALIC;  Surgeon: Conrad Scranton, MD;  Location: [facility];  Service: Vascular;  Laterality: Right;   CARDIAC CATHETERIZATION N/A 11/02/2016   Procedure: Left Heart Cath and Coronary Angiography;  Surgeon: Sherren Mocha, MD;  Location: [facility] [place] [facility];  Service: Cardiovascular;  Laterality: N/A;   CESAREAN SECTION     COLONOSCOPY     DILATATION & CURETTAGE/HYSTEROSCOPY WITH MYOSURE N/A 09/02/2018   Procedure: DILATATION & CURETTAGE/HYSTEROSCOPY WITH MYOSURE;  Surgeon: Christophe Louis, MD;  Location: [facility];  Service: Gynecology;  Laterality: N/A;   FISTULA PLUG     FISTULA SUPERFICIALIZATION Right 05/02/2018   Procedure: FISTULA SUPERFICIALIZATION RIGHT ARTERIOVENOUS FISTULA;  Surgeon: Conrad Nashua, MD;  Location: [facility];  Service: Vascular;  Laterality: Right;   FRACTURE SURGERY     shoulder left   INSERTION OF DIALYSIS CATHETER Right 01/04/2018   Procedure: INSERTION OF DIALYSIS CATHETER RIGHT INTERNAL JUGULAR;  Surgeon: Conrad Lipscomb, MD;  Location: [facility];  Service: Vascular;  Laterality: Right;   IR FLUORO GUIDE CV LINE RIGHT  12/31/2017   IR US GUIDE VASC ACCESS RIGHT  12/31/2017   left shoulder rotator     LIGATION OF ARTERIOVENOUS  FISTULA Right 01/24/2018   Procedure: LIGATION OF  RIGHT RADIOCEPHALIC ARTERIOVENOUS  FISTULA;  Surgeon: Conrad Sarasota, MD;  Location: [facility];  Service: Vascular;  Laterality: Right;   TONSILECTOMY, ADENOIDECTOMY, BILATERAL MYRINGOTOMY AND TUBES      TOOTH EXTRACTION     TUBAL LIGATION     Social History:  reports that she quit smoking about 24 years ago. Her smoking use included cigarettes. She has never used smokeless tobacco. She reports that she does not drink alcohol and does not use drugs.  Allergies  Allergen Reactions   Compazine [Prochlorperazine Edisylate] Swelling and Other (See Comments)    "The tongue hardens and comes out of mouth" (Tardive dyskinesia) Also made the neck muscles twist.   Nsaids Other (See Comments)    WAS TOLD TO NOT TAKE THESE DUE TO KIDNEY ISSUES   Sensipar [Cinacalcet] Nausea And Vomiting    Causes vomiting   Vioxx [Rofecoxib] Other (See Comments)    Caused internal bleeding   Other Other (See Comments)    Unnamed "binder" caused EXTREME constipation    Family History  Problem Relation Age of Onset   Diabetes Mellitus II Mother    Hypertension Mother    Hyperlipidemia Mother    Kidney disease Mother    Eating disorder Mother    Obesity Mother    Heart disease Father    Heart attack Father 22   Congestive Heart Failure Brother    Congestive Heart Failure Son     Prior to Admission medications   Medication Sig Start Date End Date Taking? Authorizing Provider  amLODipine (NORVASC) 10 MG tablet Take 1 tablet (10 mg total) by mouth daily. Patient taking differently: Take 5 mg by mouth 2 (two) times daily. 10/22/21  Yes Early Osmond, MD  aspirin EC 81 MG tablet Take 1 tablet (81 mg total) by mouth daily. Swallow whole. 10/22/21  Yes Early Osmond, MD  losartan (COZAAR) 25 MG tablet Take 12.5 mg by mouth 2 (two) times daily. 07/30/22  Yes [provider]  ondansetron (ZOFRAN) 4 MG tablet Take 4 mg by mouth every 8 (eight) hours as needed for nausea or vomiting.   Yes [provider]  OVER THE COUNTER MEDICATION Take 1 tablet by mouth daily. Renaltab Zn ( multivit)   Yes [provider]  rosuvastatin (CRESTOR) 5 MG tablet Take 1 tablet (5 mg total) by mouth 2  (two) times a week. Patient taking differently: Take 5 mg by mouth 2 (two) times a week. Monday, Wednesday 08/06/22  Yes Weaver, Scott T, PA-C  sevelamer carbonate (RENVELA) 800 MG tablet Take 2,400 mg by mouth 3 (three) times daily with meals.   Yes [provider]  calcium carbonate (OS-CAL) 1250 (500 Ca) MG chewable tablet Chew 1 tablet by mouth daily.  08/01/20  [provider]    Physical Exam: Vitals:   09/01/22 1200 09/01/22 1214 09/01/22 1215 09/01/22 1230  BP: (!) 194/98  (!) 185/76 (!) 179/84  Pulse: 77 78 77 76  Resp: (!) 26 (!) 24 (!) 26 (!) 26  Temp:      TempSrc:      SpO2: 100% 100% 100% 100%  Weight:      Height:  Constitutional: Morbidly obese female currently in no acute distress Eyes: PERRL, lids and conjunctivae normal ENMT: Mucous membranes are moist Neck: normal, supple  Respiratory: Normal respiratory effort with some mild crackles noted in the lower lung fields otherwise clear.  Patient on 2 L nasal cannula oxygen currently while receiving dialysis. Cardiovascular: Regular rate and rhythm, no murmurs / rubs / gallops.  Bilateral lower extremity edema.  Fistula present of the right upper extremity Abdomen: no tenderness, no masses palpated.  Bowel sounds positive.  Musculoskeletal: no clubbing / cyanosis. No joint deformity upper and lower extremities. Good ROM, no contractures. Normal muscle tone.  Skin: no rashes, lesions, ulcers. No induration Neurologic: CN 2-12 grossly intact. Sensation intact, DTR normal. Strength 5/5 in all 4.  Psychiatric: Normal judgment and insight. Alert and oriented x 3. Normal mood.   Data Reviewed:  EKG reveals sinus rhythm 84 bpm with age-indeterminate septal infarct.  Reviewed labs, imaging, and pertinent records as noted above in HPI.  Assessment and Plan:  Acute respiratory failure secondary to fluid overload ESRD on HD Patient presented with complaints of weakness.  Last hemodialysis session on  9/9.  Chest x-ray enlarged cardiac silhouette with pulmonary vascular congestion without frank edema.  Due to her respiratory distress she was placed on BiPAP.  Patient was able to be taken off BiPAP to be dialyzed. -Admit to progressive bed -Continuous pulse oximetry with nasal cannula oxygen maintain O2 saturations greater than 92% -Wean off oxygen as able -HD per nephrology -Appreciate nephrology consultative services   Hyperkalemia Acute.  Initial potassium 6.3.  Patient had been given calcium, insulin, D50, furosemide IV, and albuterol breathing treatment in the ED.  Nephrology consulted and plan on dialyzing patient.  Patient on losartan which could be causing symptoms. -To be managed with HD  Hypertensive urgency Acute.  Initial blood pressure elevated up to 218/96.  Home blood pressure regimen includes amlodipine 5 mg twice daily and losartan 12.5 mg twice daily. -Continue amlodipine -Hydralazine IV as needed  Leukocytosis WBC elevated at 14.8 on admission.  Patient has otherwise afebrile denies any infectious symptoms. -Recheck CBC tomorrow morning  Anemia of chronic kidney disease Hemoglobin 11.4 g/dL which appears near patient's baseline.  Patient receives ESA with dialysis ordered by nephrology. -Continue to monitor  Diabetes mellitus type 2 Patient reportedly is diet controlled and not on medication for treatment.  Last available hemoglobin A1c 7.3 in 07/2021. -Continue renal and carb modified diet -Consider adding on sliding scale insulin if blood sugar seen to be greater than 180.  Morbid obesity BMI 45.94 kg/m  Advance Care Planning:   Code Status: Full  Consults: Nephrology  Family Communication: None requested  Severity of Illness: The appropriate patient status for this patient is INPATIENT. Inpatient status is judged to be reasonable and necessary in order to provide the required intensity of service to ensure the patient's safety. The patient's presenting  symptoms, physical exam findings, and initial radiographic and laboratory data in the context of their chronic comorbidities is felt to place them at high risk for further clinical deterioration. Furthermore, it is not anticipated that the patient will be medically stable for discharge from the hospital within 2 midnights of admission.   * I certify that at the point of admission it is my clinical judgment that the patient will require inpatient hospital care spanning beyond 2 midnights from the point of admission due to high intensity of service, high risk for further deterioration and high frequency of surveillance required.*  Author: Norval Morton, MD 09/01/2022 1:08 PM  For on call review [email or web address].

## 2022-09-01 NOTE — Procedures (Signed)
I saw the patient and agree with the above assessment and plan.   Doing well at the beginnin of HD.  2K bath UF Goal 4L.

## 2022-09-01 NOTE — ED Provider Triage Note (Signed)
Emergency Medicine Provider Triage Evaluation Note  Pamela Campbell , a 61 y.o. female  was evaluated in triage.  Pt complains of fluid overload. She is a TuThSat dialysis patient and reports she felt too fluid overload to go to dialysis today. She reports she feels SOB and that she has fluid on her lungs. Denies any chest pain or fevers. Reports a cough as well.   Review of Systems  Positive:  Negative:   Physical Exam  BP (!) 216/93 (BP Location: Left Wrist)   Pulse 84   Temp 99 F (37.2 C) (Oral)   Resp (!) 22   Ht 5\' 4"  (1.626 m)   Wt 122.5 kg   LMP 02/02/2015   SpO2 100%   BMI 46.35 kg/m  Gen:   Awake, no distress   Resp:  [place] MSK:   Moves extremities without difficulty  Other:    Medical Decision Making  Medically screening exam initiated at 9:44 AM.  Appropriate orders placed.  Pamela Campbell was informed that the remainder of the evaluation will be completed by another provider, this initial triage assessment does not replace that evaluation, and the importance of remaining in the ED until their evaluation is complete.  Patient being roomed to 24 now.    Sherrell Puller, [place] 09/01/22 [phone]

## 2022-09-01 NOTE — Progress Notes (Signed)
Received patient in bed to unit.  Alert and oriented.  Informed consent signed and in chart.   Treatment initiated: 1355 Treatment completed: 1800  Patient tolerated well.  Transported to 3E17 Alert, without acute distress.  Hand-off given to patient's nurse.   Access used: fistula  Access issues: none  Total UF removed: 3.8 liters  Medication(s) given: none Post HD VS: 164 76 HR 75 RR 20 Sat 100% on 2 liters nasal cannula Temp oral 97.7 Post HD weight: 121.4 kg stretcher wt   [facility]

## 2022-09-01 NOTE — ED Triage Notes (Signed)
Pt. Stated, fluid over load and too weak to go. Suppose to have dialysis today . Last dialysis was on Saturday.

## 2022-09-01 NOTE — ED Provider Notes (Signed)
[facility] EMERGENCY DEPARTMENT Provider Note   CSN: [id number] Arrival date & time: 09/01/22  0626     History  Chief Complaint  Patient presents with   Weakness    Pamela Campbell is a 61 y.o. female.  HPI Patient presents from home with dyspnea, fatigue.  Patient receives dialysis Tuesday, Thursday, Saturday.  Today is Tuesday.  She notes that she cannot go today due to increasing dyspnea.  Last session was nearly complete but the patient notes that she did not have full amount of fluid removed.  Since that time she has had worsening symptoms, with no relief with anything.  She is taking her medication as directed.  No focal chest pain, no headache, no fever.    Home Medications Prior to Admission medications   Medication Sig Start Date End Date Taking? Authorizing Provider  amLODipine (NORVASC) 10 MG tablet Take 1 tablet (10 mg total) by mouth daily. 10/22/21   Early Osmond, MD  aspirin EC 81 MG tablet Take 1 tablet (81 mg total) by mouth daily. Swallow whole. 10/22/21   Early Osmond, MD  losartan (COZAAR) 25 MG tablet Take 1/2 tablet by mouth in the morning and in the evening 07/30/22   [provider]  ondansetron (ZOFRAN) 4 MG tablet Take 4 mg by mouth every 8 (eight) hours as needed for nausea or vomiting.    [provider]  rosuvastatin (CRESTOR) 5 MG tablet Take 1 tablet (5 mg total) by mouth 2 (two) times a week. 08/06/22   Richardson Dopp T, PA-C  calcium carbonate (OS-CAL) 1250 (500 Ca) MG chewable tablet Chew 1 tablet by mouth daily.  08/01/20  [provider]      Allergies    Compazine [prochlorperazine edisylate], Nsaids, Sensipar [cinacalcet], Vioxx [rofecoxib], and Other    Review of Systems   Review of Systems  All other systems reviewed and are negative.   Physical Exam Updated Vital Signs BP (!) 218/96   Pulse 76   Temp 99 F (37.2 C) (Oral)   Resp (!) 22   Ht 5\' 4"  (1.626 m)   Wt 122.5 kg   LMP  02/02/2015   SpO2 100%   BMI 46.35 kg/m  Physical Exam Vitals and nursing note reviewed.  Constitutional:      General: She is not in acute distress.    Appearance: She is well-developed.  HENT:     Head: Normocephalic and atraumatic.  Eyes:     Conjunctiva/sclera: Conjunctivae normal.  Cardiovascular:     Rate and Rhythm: Normal rate and regular rhythm.  Pulmonary:     Effort: Pulmonary effort is normal. Tachypnea present.     Breath sounds: Decreased air movement present. No stridor. Decreased breath sounds present.  Abdominal:     General: There is no distension.  Musculoskeletal:       Arms:  Skin:    General: Skin is warm and dry.  Neurological:     Mental Status: She is alert and oriented to person, place, and time.     Cranial Nerves: No cranial nerve deficit.  Psychiatric:        Mood and Affect: Mood normal.     ED Results / Procedures / Treatments   Labs (all labs ordered are listed, but only abnormal results are displayed) Labs Reviewed  CBC WITH DIFFERENTIAL/PLATELET - Abnormal; Notable for the following components:      Result Value   WBC 14.8 (*)  RBC 3.62 (*)    Hemoglobin 11.4 (*)    HCT 35.2 (*)    Neutro Abs 11.8 (*)    All other components within normal limits  COMPREHENSIVE METABOLIC PANEL  BRAIN NATRIURETIC PEPTIDE  TROPONIN I (HIGH SENSITIVITY)  TROPONIN I (HIGH SENSITIVITY)    EKG EKG Interpretation  Date/Time:  Tuesday September 01 2022 09:32:33 EDT Ventricular Rate:  84 PR Interval:  146 QRS Duration: 82 QT Interval:  376 QTC Calculation: 444 R Axis:   5 Text Interpretation: Normal sinus rhythm Septal infarct , age undetermined Artifact unremarkable ecg Confirmed by Carmin Muskrat [phone]) on 09/01/2022 9:48:54 AM  Radiology DG Chest 2 View  Result Date: 09/01/2022 CLINICAL DATA:  3 day history of shortness of breath. EXAM: CHEST - 2 VIEW COMPARISON:  10/15/2021 FINDINGS: The cardio pericardial silhouette is enlarged. There  is pulmonary vascular congestion without overt pulmonary edema. Probable bibasilar atelectasis although superimposition of soft tissue hinders assessment. No evidence for pleural effusion. The visualized bony structures of the thorax are unremarkable. IMPRESSION: Enlarged cardiopericardial silhouette with pulmonary vascular congestion. Electronically Signed   By: Misty Stanley M.D.   On: 09/01/2022 10:21    Procedures Procedures    Medications Ordered in ED Medications  nitroGLYCERIN (NITROGLYN) 2 % ointment 0.5 inch (0.5 inches Topical Given 09/01/22 1108)    ED Course/ Medical Decision Making/ A&P This patient with a Hx of end-stage renal disease, hypertension presents to the ED for concern of dyspnea, weakness, this involves an extensive number of treatment options, and is a complaint that carries with it a high risk of complications and morbidity.    The differential diagnosis includes fluid overload status, pulmonary congestion, infectious etiology, bacteremia, sepsis, electrolyte abnormalities   Social Determinants of Health:  Obesity, end-stage renal disease  Additional history obtained:  Additional history and/or information obtained from chart review, notable for patient is a patient of another health system, chart review notable for ongoing dialysis there   After the initial evaluation, orders, including: Labs x-ray, and soon thereafter BiPAP were initiated.   Patient placed on Cardiac and Pulse-Oximetry Monitors. The patient was maintained on a cardiac monitor.  The cardiac monitored showed an rhythm of 80 sinus normal The patient was also maintained on pulse oximetry. The readings were typically 96% room air, the labored normal  After patient was on BiPAP for most 2 hours her work of breathing improved substantially, oxygenation was appropriate On repeat evaluation of the patient improved  Lab Tests:  I personally interpreted labs.  The pertinent results include:  Hyperkalemia, elevated creatinine  Imaging Studies ordered:  I independently visualized and interpreted imaging which showed pulmonary congestion I agree with the radiologist interpretation  Consultations Obtained:  I requested consultation with the nephrology, internal medicine,  and discussed lab and imaging findings as well as pertinent plan - they recommend: Dialysis  Dispostion / Final MDM:  After consideration of the diagnostic results and the patient's response to treatment, this adult female, obese, end-stage renal disease, presents with dyspnea, sign of fluid overload status, as well as critically abnormal hypertension.  Patient required nitroglycerin paste, BiPAP, and after labs were consistent with critically abnormal hyper kalemia she received albuterol, insulin, dextrose.  Case discussed with nephrology to facilitate dialysis from the ED, after which the patient was admitted for further monitoring, management.  Patient did improve substantially while in the ED.  Final Clinical Impression(s) / ED Diagnoses Final diagnoses:  Hypertensive emergency  Hyperkalemia   CRITICAL CARE Performed  by: Carmin Muskrat Total critical care time: 35 minutes Critical care time was exclusive of separately billable procedures and treating other patients. Critical care was necessary to treat or prevent imminent or life-threatening deterioration. Critical care was time spent personally by me on the following activities: development of treatment plan with patient and/or surrogate as well as nursing, discussions with consultants, evaluation of patient's response to treatment, examination of patient, obtaining history from patient or surrogate, ordering and performing treatments and interventions, ordering and review of laboratory studies, ordering and review of radiographic studies, pulse oximetry and re-evaluation of patient's condition.    Carmin Muskrat, MD 09/01/22 [phone]

## 2022-09-01 NOTE — Progress Notes (Signed)
RT removed pt from BiPAP and left on room air. Pt tolerating well at this time with SVS and on complications or distress. Pt is going to HD.

## 2022-09-01 NOTE — Consult Note (Signed)
[facility] Renal Consultation Note    Indication for Consultation:  Management of ESRD/hemodialysis, anemia, hypertension/volume, and secondary hyperparathyroidism. PCP:  HPI: Pamela Campbell is a 61 y.o. female with ESRD, HTN, T2DM, IBS-C, and obesity who being evaluated in the ED with acute respiratory failure/volume overload.  Says that she always has an issue with dyspnea on Mondays before her Tuesday treatments, yesterday was worse than usual which prompted ED evaluation this AM. No fever, chills, CP. She does have a cough, but always gets a cough when she has extra fluid on. She has chronic abdominal pain and constipation, no worse than usual. No vomiting or diarrhea. In ED, she developed hypoxia requiring BiPAP. Labs with Na 140, K 6.3, BUN 77, WBC 14.8, Hgb 11.4, trop 57. CXR with pulmonary congestion, no effusions, no pneumonia. EKG without ST changes.  Dialyzes on TTS schedule at [facility] via RUE AVF - no recent issues.  Past Medical History:  Diagnosis Date   Anemia    Chronic diastolic CHF (congestive heart failure) ([place])    a. 03/2015: echo w/ EF of 50-55%, no WMA, Grade 2 DD, trivial AI, mild MR.   Chronic kidney disease (CKD), stage V ([place])    dialysis tuesday, thursday saterday   Constipation    Diabetes mellitus without complication ([place])    CONTROLLED WITH DIET   Dyspnea    non lately   Edema, lower extremity    Hypertension    Overweight    Pneumonia 2016   WHILE HOSPIATLIZED   Shortness of breath    Past Surgical History:  Procedure Laterality Date   AV FISTULA PLACEMENT Right 01/04/2018   Procedure: ARTERIOVENOUS (AV) FISTULA CREATION RIGHT ARM;  Surgeon: Conrad [place], MD;  Location: [place];  Service: Vascular;  Laterality: Right;   AV FISTULA PLACEMENT Right 01/24/2018   Procedure: ARTERIOVENOUS (AV) FISTULA CREATION RIGHT BRACHIOCEPHALIC;  Surgeon: Conrad Rea, MD;  Location: [place];  Service: Vascular;  Laterality: Right;   CARDIAC  CATHETERIZATION N/A 11/02/2016   Procedure: Left Heart Cath and Coronary Angiography;  Surgeon: Sherren Mocha, MD;  Location: [place] [facility];  Service: Cardiovascular;  Laterality: N/A;   CESAREAN SECTION     COLONOSCOPY     DILATATION & CURETTAGE/HYSTEROSCOPY WITH MYOSURE N/A 09/02/2018   Procedure: DILATATION & CURETTAGE/HYSTEROSCOPY WITH MYOSURE;  Surgeon: Christophe Louis, MD;  Location: [facility];  Service: Gynecology;  Laterality: N/A;   FISTULA PLUG     FISTULA SUPERFICIALIZATION Right 05/02/2018   Procedure: FISTULA SUPERFICIALIZATION RIGHT ARTERIOVENOUS FISTULA;  Surgeon: Conrad [place], MD;  Location: [place];  Service: Vascular;  Laterality: Right;   FRACTURE SURGERY     shoulder left   INSERTION OF DIALYSIS CATHETER Right 01/04/2018   Procedure: INSERTION OF DIALYSIS CATHETER RIGHT INTERNAL JUGULAR;  Surgeon: Conrad [place], MD;  Location: [place];  Service: Vascular;  Laterality: Right;   IR FLUORO GUIDE CV LINE RIGHT  12/31/2017   IR US GUIDE VASC ACCESS RIGHT  12/31/2017   left shoulder rotator     LIGATION OF ARTERIOVENOUS  FISTULA Right 01/24/2018   Procedure: LIGATION OF  RIGHT RADIOCEPHALIC ARTERIOVENOUS  FISTULA;  Surgeon: Conrad , MD;  Location: [facility];  Service: Vascular;  Laterality: Right;   TONSILECTOMY, ADENOIDECTOMY, BILATERAL MYRINGOTOMY AND TUBES     TOOTH EXTRACTION     TUBAL LIGATION     Family History  Problem Relation Age of Onset   Diabetes Mellitus II Mother  Hypertension Mother    Hyperlipidemia Mother    Kidney disease Mother    Eating disorder Mother    Obesity Mother    Heart disease Father    Heart attack Father 80   Congestive Heart Failure Brother    Congestive Heart Failure Son    Social History:  reports that she quit smoking about 24 years ago. Her smoking use included cigarettes. She has never used smokeless tobacco. She reports that she does not drink alcohol and does not use drugs.  ROS: As per HPI otherwise negative.  Physical  Exam: Vitals:   09/01/22 1200 09/01/22 1214 09/01/22 1215 09/01/22 1230  BP: (!) 194/98  (!) 185/76 (!) 179/84  Pulse: 77 78 77 76  Resp: (!) 26 (!) 24 (!) 26 (!) 26  Temp:      TempSrc:      SpO2: 100% 100% 100% 100%  Weight:      Height:         General: Well developed, BiPAP in place. Head: Normocephalic, atraumatic Neck: Supple without lymphadenopathy/masses. JVD not elevated. Lungs: Clear bilaterally to auscultation  in upper lobes, ?faint basilar crackles - limited by habitus Heart: RRR with normal S1, S2. No murmurs, rubs, or gallops appreciated. Abdomen: Soft, non-tender, non-distended with normoactive bowel sounds. No rebound/guarding.  Musculoskeletal:  Strength and tone appear normal for age. Lower extremities: No LE edema, no open wounds. Neuro: Alert and oriented X 3. Moves all extremities spontaneously. Psych:  Responds to questions appropriately with a normal affect. Dialysis Access: RUE AVF + bruit  Allergies  Allergen Reactions   Compazine [Prochlorperazine Edisylate] Swelling and Other (See Comments)    "The tongue hardens and comes out of mouth" (Tardive dyskinesia) Also made the neck muscles twist.   Nsaids Other (See Comments)    WAS TOLD TO NOT TAKE THESE DUE TO KIDNEY ISSUES   Sensipar [Cinacalcet] Nausea And Vomiting    Causes vomiting   Vioxx [Rofecoxib] Other (See Comments)    Caused internal bleeding   Other Other (See Comments)    Unnamed "binder" caused EXTREME constipation   Prior to Admission medications   Medication Sig Start Date End Date Taking? Authorizing Provider  amLODipine (NORVASC) 10 MG tablet Take 1 tablet (10 mg total) by mouth daily. Patient taking differently: Take 5 mg by mouth 2 (two) times daily. 10/22/21  Yes Early Osmond, MD  aspirin EC 81 MG tablet Take 1 tablet (81 mg total) by mouth daily. Swallow whole. 10/22/21  Yes Early Osmond, MD  losartan (COZAAR) 25 MG tablet Take 12.5 mg by mouth 2 (two) times daily. 07/30/22   Yes [provider]  ondansetron (ZOFRAN) 4 MG tablet Take 4 mg by mouth every 8 (eight) hours as needed for nausea or vomiting.   Yes [provider]  OVER THE COUNTER MEDICATION Take 1 tablet by mouth daily. Renaltab Zn ( multivit)   Yes [provider]  rosuvastatin (CRESTOR) 5 MG tablet Take 1 tablet (5 mg total) by mouth 2 (two) times a week. Patient taking differently: Take 5 mg by mouth 2 (two) times a week. Monday, Wednesday 08/06/22  Yes Weaver, Scott T, PA-C  sevelamer carbonate (RENVELA) 800 MG tablet Take 2,400 mg by mouth 3 (three) times daily with meals.   Yes [provider]  calcium carbonate (OS-CAL) 1250 (500 Ca) MG chewable tablet Chew 1 tablet by mouth daily.  08/01/20  [provider]   Current Facility-Administered Medications  Medication Dose  Route Frequency Provider Last Rate Last Admin   [START ON 09/02/2022] Chlorhexidine Gluconate Cloth 2 % PADS 6 each  6 each Topical Q0600 Loren Racer, PA-C       sodium zirconium cyclosilicate (LOKELMA) packet 10 g  10 g Oral Once Carmin Muskrat, MD       Current Outpatient Medications  Medication Sig Dispense Refill   amLODipine (NORVASC) 10 MG tablet Take 1 tablet (10 mg total) by mouth daily. (Patient taking differently: Take 5 mg by mouth 2 (two) times daily.) 90 tablet 3   aspirin EC 81 MG tablet Take 1 tablet (81 mg total) by mouth daily. Swallow whole. 90 tablet 3   losartan (COZAAR) 25 MG tablet Take 12.5 mg by mouth 2 (two) times daily.     ondansetron (ZOFRAN) 4 MG tablet Take 4 mg by mouth every 8 (eight) hours as needed for nausea or vomiting.     OVER THE COUNTER MEDICATION Take 1 tablet by mouth daily. Renaltab Zn ( multivit)     rosuvastatin (CRESTOR) 5 MG tablet Take 1 tablet (5 mg total) by mouth 2 (two) times a week. (Patient taking differently: Take 5 mg by mouth 2 (two) times a week. Monday, Wednesday) 25 tablet 3   sevelamer carbonate (RENVELA) 800 MG tablet Take  2,400 mg by mouth 3 (three) times daily with meals.     Labs: Basic Metabolic Panel: Recent Labs  Lab 09/01/22 0956  NA 140  K 6.3*  CL 102  CO2 24  GLUCOSE 123*  BUN 77*  CREATININE 13.20*  CALCIUM 9.1   Liver Function Tests: Recent Labs  Lab 09/01/22 0956  AST 23  ALT 24  ALKPHOS 115  BILITOT 0.8  PROT 7.7  ALBUMIN 3.6   CBC: Recent Labs  Lab 09/01/22 0956  WBC 14.8*  NEUTROABS 11.8*  HGB 11.4*  HCT 35.2*  MCV 97.2  PLT 294   Studies/Results: DG Chest 2 View  Result Date: 09/01/2022 CLINICAL DATA:  3 day history of shortness of breath. EXAM: CHEST - 2 VIEW COMPARISON:  10/15/2021 FINDINGS: The cardio pericardial silhouette is enlarged. There is pulmonary vascular congestion without overt pulmonary edema. Probable bibasilar atelectasis although superimposition of soft tissue hinders assessment. No evidence for pleural effusion. The visualized bony structures of the thorax are unremarkable. IMPRESSION: Enlarged cardiopericardial silhouette with pulmonary vascular congestion. Electronically Signed   By: Misty Stanley M.D.   On: 09/01/2022 10:21    Dialysis Orders:  TTS at [facility] -> full HD 9/9, post-HD weight 121.5kg 4hr, 450/600, EDW 120.5kg, 2K/2.5Ca, RUE AVF, heparin 3500 unit bolus - Epogen 1600U TIW - Zemplar 65mcg IV q HD - Getting course of IV iron as outpatient. - HBsAg negative 08/27/22  Assessment/Plan:  Acute Resp Failure/Pulm edema: For HD today to correct, will aim for 4L UFG. WBC high, but no signs of pneumonia on CXR - follow.  Hyperkalemia: For HD today to correct, then reassess.  ESRD:  Usual TTS schedule - HD today - then back to ED to reassess if needs full admission  Hypertension/volume: BP very high - follow after HD and resumption of home meds. Has nitro paste on chest now.  Anemia: Hgb > 11, no ESA needs.  Metabolic bone disease: Ca ok, continue home meds.  T2DM  Veneta Penton, Hershal Coria 09/01/2022, 12:45 PM  [employer]

## 2022-09-02 DIAGNOSIS — N186 End stage renal disease: Secondary | ICD-10-CM | POA: Diagnosis not present

## 2022-09-02 DIAGNOSIS — D638 Anemia in other chronic diseases classified elsewhere: Secondary | ICD-10-CM | POA: Diagnosis not present

## 2022-09-02 DIAGNOSIS — Z992 Dependence on renal dialysis: Secondary | ICD-10-CM | POA: Diagnosis not present

## 2022-09-02 DIAGNOSIS — E8770 Fluid overload, unspecified: Secondary | ICD-10-CM | POA: Diagnosis present

## 2022-09-02 DIAGNOSIS — J9601 Acute respiratory failure with hypoxia: Secondary | ICD-10-CM | POA: Diagnosis not present

## 2022-09-02 LAB — RENAL FUNCTION PANEL
Albumin: 3 g/dL — ABNORMAL LOW (ref 3.5–5.0)
Anion gap: 13 (ref 5–15)
BUN: 44 mg/dL — ABNORMAL HIGH (ref 6–20)
CO2: 27 mmol/L (ref 22–32)
Calcium: 8.7 mg/dL — ABNORMAL LOW (ref 8.9–10.3)
Chloride: 97 mmol/L — ABNORMAL LOW (ref 98–111)
Creatinine, Ser: 8.39 mg/dL — ABNORMAL HIGH (ref 0.44–1.00)
GFR, Estimated: 5 mL/min — ABNORMAL LOW (ref >60)
Glucose, Bld: 117 mg/dL — ABNORMAL HIGH (ref 70–99)
Phosphorus: 7.5 mg/dL — ABNORMAL HIGH (ref 2.5–4.6)
Potassium: 5.2 mmol/L — ABNORMAL HIGH (ref 3.5–5.1)
Sodium: 137 mmol/L (ref 135–145)

## 2022-09-02 LAB — CBC
HCT: 31.2 % — ABNORMAL LOW (ref 36.0–46.0)
Hemoglobin: 10.4 g/dL — ABNORMAL LOW (ref 12.0–15.0)
MCH: 31 pg (ref 26.0–34.0)
MCHC: 33.3 g/dL (ref 30.0–36.0)
MCV: 93.1 fL (ref 80.0–100.0)
Platelets: 247 10*3/uL (ref 150–400)
RBC: 3.35 MIL/uL — ABNORMAL LOW (ref 3.87–5.11)
RDW: 14.7 % (ref 11.5–15.5)
WBC: 9.3 10*3/uL (ref 4.0–10.5)
nRBC: 0 % (ref 0.0–0.2)

## 2022-09-02 LAB — HEPATITIS B SURFACE ANTIBODY, QUANTITATIVE: Hep B S AB Quant (Post): 4.8 m[IU]/mL — ABNORMAL LOW (ref >9.9)

## 2022-09-02 LAB — HIV ANTIBODY (ROUTINE TESTING W REFLEX): HIV Screen 4th Generation wRfx: NONREACTIVE

## 2022-09-02 MED ORDER — HYDRALAZINE HCL 25 MG PO TABS: 25.0000 mg | ORAL_TABLET | Freq: Two times a day (BID) | ORAL | 0 refills | Status: AC

## 2022-09-02 NOTE — Discharge Summary (Signed)
Physician Discharge Summary  Pamela Campbell NLZ:[id number] DOB: 1961/09/29 DOA: 09/01/2022  PCP: Caren Macadam, MD  Admit date: 09/01/2022 Discharge date: 09/02/2022  Time spent: 35 minutes  Recommendations for Outpatient Follow-up:  Return to try dialysis in [facility] tomorrow for routine HD PC P in 1 week, consider routine echocardiogram in few months   Discharge Diagnoses:  Principal Problem:   Fluid overload Active Problems:   ESRD on dialysis ([place])   Acute respiratory failure with hypoxia ([facility])   Hyperkalemia   Hypertensive urgency   Leukocytosis   Anemia of chronic disease   Type 2 diabetes mellitus ([place])   Morbid obesity ([place])   Discharge Condition: Stable  Diet recommendation: Renal, diabetic  Filed Weights   09/01/22 1820 09/01/22 1825 09/02/22 0511  Weight: 121.4 kg 121.4 kg 121 kg    History of present illness:  61/F with history of ESRD on HD, diabetes mellitus, obesity, chronic diastolic CHF presented to the ED with shortness of breath, swelling and nausea, in the ED she was noted to be tachypneic, BP was 218/96, in respiratory distress, placed on BiPAP, creatinine was 13, troponin was 57, chest x-ray noted pulmonary vascular congestion  Hospital Course:   Acute respiratory failure with hypoxia -Resolved, required BiPAP on admission yesterday, improved after dialysis, weaned off O2 -Discharged home in a stable condition, advised to return to next hemodialysis on Thursday 9/14 at her regular center, also advised follow-up with PCP for nonurgent echocardiogram  Hyperkalemia -Resolved  ESRD on HD -As above, dialyzed in the hospital, over 4kg down -Return to next HD as outpatient on 9/14  Mild leukocytosis -Few reactive, resolved  Chronic diastolic CHF -Volume managed with HD, recommend outpatient echo, cardiomegaly noted on x-ray  Diabetes mellitus -Hemoglobin A1c is 7.3 in 8/22, not on meds for this, recommended weight loss and recheck A1c with PCP  in 1 month    Discharge Exam: Vitals:   09/02/22 0904 09/02/22 1235  BP: (!) 189/84 (!) 177/93  Pulse:  78  Resp:  20  Temp:  98.1 F (36.7 C)  SpO2:  97%   Gen: Awake, Alert, Oriented X 3,  HEENT: no JVD Lungs: Good air movement bilaterally, CTAB CVS: S1S2/RRR Abd: soft, Non tender, non distended, BS present Extremities: No edema Skin: no new rashes on exposed skin   Discharge Instructions    Allergies as of 09/02/2022       Reactions   Compazine [prochlorperazine Edisylate] Swelling, Other (See Comments)   "The tongue hardens and comes out of mouth" (Tardive dyskinesia) Also made the neck muscles twist.   Nsaids Other (See Comments)   WAS TOLD TO NOT TAKE THESE DUE TO KIDNEY ISSUES   Sensipar [cinacalcet] Nausea And Vomiting   Causes vomiting   Vioxx [rofecoxib] Other (See Comments)   Caused internal bleeding   Other Other (See Comments)   Unnamed "binder" caused EXTREME constipation        Medication List     STOP taking these medications    losartan 25 MG tablet Commonly known as: COZAAR       TAKE these medications    amLODipine 10 MG tablet Commonly known as: NORVASC Take 1 tablet (10 mg total) by mouth daily. What changed:  how much to take when to take this   aspirin EC 81 MG tablet Take 1 tablet (81 mg total) by mouth daily. Swallow whole.   hydrALAZINE 25 MG tablet Commonly known as: APRESOLINE Take 1 tablet (25 mg total) by  mouth 2 (two) times daily.   ondansetron 4 MG tablet Commonly known as: ZOFRAN Take 4 mg by mouth every 8 (eight) hours as needed for nausea or vomiting.   OVER THE COUNTER MEDICATION Take 1 tablet by mouth daily. Renaltab Zn ( multivit)   rosuvastatin 5 MG tablet Commonly known as: CRESTOR Take 1 tablet (5 mg total) by mouth 2 (two) times a week. What changed: additional instructions   sevelamer carbonate 800 MG tablet Commonly known as: RENVELA Take 2,400 mg by mouth 3 (three) times daily with meals.        Allergies  Allergen Reactions   Compazine [Prochlorperazine Edisylate] Swelling and Other (See Comments)    "The tongue hardens and comes out of mouth" (Tardive dyskinesia) Also made the neck muscles twist.   Nsaids Other (See Comments)    WAS TOLD TO NOT TAKE THESE DUE TO KIDNEY ISSUES   Sensipar [Cinacalcet] Nausea And Vomiting    Causes vomiting   Vioxx [Rofecoxib] Other (See Comments)    Caused internal bleeding   Other Other (See Comments)    Unnamed "binder" caused EXTREME constipation      The results of significant diagnostics from this hospitalization (including imaging, microbiology, ancillary and laboratory) are listed below for reference.    Significant Diagnostic Studies: DG Chest 2 View  Result Date: 09/01/2022 CLINICAL DATA:  3 day history of shortness of breath. EXAM: CHEST - 2 VIEW COMPARISON:  10/15/2021 FINDINGS: The cardio pericardial silhouette is enlarged. There is pulmonary vascular congestion without overt pulmonary edema. Probable bibasilar atelectasis although superimposition of soft tissue hinders assessment. No evidence for pleural effusion. The visualized bony structures of the thorax are unremarkable. IMPRESSION: Enlarged cardiopericardial silhouette with pulmonary vascular congestion. Electronically Signed   By: Misty Stanley M.D.   On: 09/01/2022 10:21    Microbiology: No results found for this or any previous visit (from the past 240 hour(s)).   Labs: Basic Metabolic Panel: Recent Labs  Lab 09/01/22 0956 09/02/22 0457  NA 140 137  K 6.3* 5.2*  CL 102 97*  CO2 24 27  GLUCOSE 123* 117*  BUN 77* 44*  CREATININE 13.20* 8.39*  CALCIUM 9.1 8.7*  PHOS  --  7.5*   Liver Function Tests: Recent Labs  Lab 09/01/22 0956 09/02/22 0457  AST 23  --   ALT 24  --   ALKPHOS 115  --   BILITOT 0.8  --   PROT 7.7  --   ALBUMIN 3.6 3.0*   No results for input(s): "LIPASE", "AMYLASE" in the last 168 hours. No results for input(s): "AMMONIA" in  the last 168 hours. CBC: Recent Labs  Lab 09/01/22 0956 09/02/22 0457  WBC 14.8* 9.3  NEUTROABS 11.8*  --   HGB 11.4* 10.4*  HCT 35.2* 31.2*  MCV 97.2 93.1  PLT 294 247   Cardiac Enzymes: No results for input(s): "CKTOTAL", "CKMB", "CKMBINDEX", "TROPONINI" in the last 168 hours. BNP: BNP (last 3 results) Recent Labs    09/01/22 0956  BNP 508.3*    ProBNP (last 3 results) No results for input(s): "PROBNP" in the last 8760 hours.  CBG: No results for input(s): "GLUCAP" in the last 168 hours.     Signed:  Domenic Polite MD.  [facility] 09/02/2022, 1:30 PM

## 2022-09-02 NOTE — TOC Transition Note (Signed)
Transition of Care [facility]) - CM/SW Discharge Note   Patient Details  Name: Pamela Campbell MRN: [id number] Date of Birth: [birth year]  Transition of Care [facility]) CM/SW Contact:  Zenon Mayo, RN Phone Number: 09/02/2022, 1:48 PM   Clinical Narrative:     Patient is for dc today, she has no needs. Her daughter will transport her home.        Patient Goals and CMS Choice        Discharge Placement                       Discharge Plan and Services                                     Social Determinants of Health (SDOH) Interventions     Readmission Risk Interventions     No data to display

## 2022-09-02 NOTE — Care Management Obs Status (Signed)
[place] NOTIFICATION   Patient Details  Name: Pamela Campbell MRN: [id number] Date of Birth: Dec 09, 1961   Medicare Observation Status Notification Given:  Yes    Zenon Mayo, RN 09/02/2022, 1:45 PM

## 2022-09-02 NOTE — Progress Notes (Signed)
Admit: 09/01/2022 LOS: 1  75F ESRD THS with vol overload, HTN emergency, Hyperkalemia  Subjective:  HD yesterday with 3.8L UF Back on RA, no dyspnea  BPs up but she says usual for her, she does nto tol SBP < 140 She feels stable for DC and HD as outpt tomorrow K now 5.2  09/12 0701 - 09/13 0700 In: 240 [P.O.:240] Out: 3800   Filed Weights   09/01/22 1820 09/01/22 1825 09/02/22 0511  Weight: 121.4 kg 121.4 kg 121 kg    Scheduled Meds:  amLODipine  5 mg Oral BID   aspirin EC  81 mg Oral Daily   Chlorhexidine Gluconate Cloth  6 each Topical Q0600   heparin  5,000 Units Subcutaneous Q8H   rosuvastatin  5 mg Oral Once per day on Mon Wed   sevelamer carbonate  2,400 mg Oral TID WC   sodium chloride flush  3 mL Intravenous Q12H   sodium zirconium cyclosilicate  10 g Oral Once   Continuous Infusions: PRN Meds:.acetaminophen **OR** acetaminophen, albuterol, ondansetron **OR** ondansetron (ZOFRAN) IV  Current Labs: reviewed    Physical Exam:  Blood pressure (!) 189/84, pulse 73, temperature 98 F (36.7 C), temperature source Oral, resp. rate (!) 21, height 5\' 4"  (1.626 m), weight 121 kg, last menstrual period 02/02/2015, SpO2 92 %. NAD Nl WOB on RA, CTAB RRR RUE AVF +B/T No sig LEE Nonfocal S/nt/nd  Dialysis Orders:  TTS at [facility] -> full HD 9/9, post-HD weight 121.5kg 4hr, 450/600, EDW 120.5kg, 2K/2.5Ca, RUE AVF, heparin 3500 unit bolus - Epogen 1600U TIW - Zemplar 32mcg IV q HD - Getting course of IV iron as outpatient. - HBsAg negative 08/27/22   Assessment/Plan:  Acute Resp Failure/Pulm edema: Resolved,now on RA.    Hyperkalemia: resolved  ESRD:  Cont on schedule, can go outpt tomorrow  Hypertension/volume: Seems her 'normal' BP is quite high, she says BPs are like usual now.    Anemia: Hgb > 10.4, no ESA needs.  Metabolic bone disease: Ca ok, continue home meds. P elevated  T2DM  Pearson Grippe MD 09/02/2022, 10:51 AM  Recent Labs  Lab 09/01/22 0956  09/02/22 0457  NA 140 137  K 6.3* 5.2*  CL 102 97*  CO2 24 27  GLUCOSE 123* 117*  BUN 77* 44*  CREATININE 13.20* 8.39*  CALCIUM 9.1 8.7*  PHOS  --  7.5*   Recent Labs  Lab 09/01/22 0956 09/02/22 0457  WBC 14.8* 9.3  NEUTROABS 11.8*  --   HGB 11.4* 10.4*  HCT 35.2* 31.2*  MCV 97.2 93.1  PLT 294 247

## 2022-09-02 NOTE — Progress Notes (Signed)
   09/02/22 1000  Mobility  Activity Ambulated independently in hallway  Level of Assistance Independent  Assistive Device None  Distance Ambulated (ft) 240 ft  Activity Response Tolerated well  $Mobility charge 1 Mobility   Mobility Specialist Progress Note  Pre-Mobility: 101 HR During Mobility: 123 HR  Received pt in bed having no complaints and agreeable to mobility. Pt was asymptomatic throughout ambulation and returned to room w/o fault. Left EOB w/ call bell in reach and all needs met.   Lucious Groves Mobility Specialist

## 2022-09-02 NOTE — Care Management CC44 (Signed)
Condition Code 44 Documentation Completed  Patient Details  Name: Pamela Campbell MRN: [id number] Date of Birth: 23-Jul-1961   Condition Code 44 given:  Yes Patient signature on Condition Code 44 notice:  Yes Documentation of 2 MD's agreement:  Yes Code 44 added to claim:  Yes    Zenon Mayo, RN 09/02/2022, 1:45 PM

## 2022-09-02 NOTE — Progress Notes (Signed)
Pt to d/c to home today. Contacted [facility] in [facility] and spoke to [facility]. Clinic advised pt will d/c today and resume care at clinic tomorrow. Clinic has access to [facility] epic to obtain d/c summary.   Melven Sartorius Renal Navigator [phone]

## 2022-09-22 ENCOUNTER — Encounter (HOSPITAL_COMMUNITY): Payer: Self-pay | Admitting: Emergency Medicine

## 2022-09-22 ENCOUNTER — Other Ambulatory Visit: Payer: Self-pay

## 2022-09-22 ENCOUNTER — Emergency Department (HOSPITAL_COMMUNITY): Payer: Medicare HMO

## 2022-09-22 ENCOUNTER — Emergency Department (HOSPITAL_COMMUNITY)
Admission: EM | Admit: 2022-09-22 | Discharge: 2022-09-23 | Disposition: A | Discharge status: Home / Self Care | Payer: Medicare HMO | Attending: Emergency Medicine | Admitting: Emergency Medicine

## 2022-09-22 DIAGNOSIS — J810 Acute pulmonary edema: Secondary | ICD-10-CM | POA: Diagnosis not present

## 2022-09-22 DIAGNOSIS — Z20822 Contact with and (suspected) exposure to covid-19: Secondary | ICD-10-CM | POA: Insufficient documentation

## 2022-09-22 DIAGNOSIS — I5032 Chronic diastolic (congestive) heart failure: Secondary | ICD-10-CM | POA: Insufficient documentation

## 2022-09-22 DIAGNOSIS — E1122 Type 2 diabetes mellitus with diabetic chronic kidney disease: Secondary | ICD-10-CM | POA: Diagnosis not present

## 2022-09-22 DIAGNOSIS — R0602 Shortness of breath: Secondary | ICD-10-CM | POA: Diagnosis present

## 2022-09-22 DIAGNOSIS — N186 End stage renal disease: Secondary | ICD-10-CM | POA: Diagnosis not present

## 2022-09-22 DIAGNOSIS — Z992 Dependence on renal dialysis: Secondary | ICD-10-CM | POA: Diagnosis not present

## 2022-09-22 DIAGNOSIS — Z7982 Long term (current) use of aspirin: Secondary | ICD-10-CM | POA: Insufficient documentation

## 2022-09-22 LAB — COMPREHENSIVE METABOLIC PANEL
ALT: 20 U/L (ref 0–44)
AST: 16 U/L (ref 15–41)
Albumin: 3.8 g/dL (ref 3.5–5.0)
Alkaline Phosphatase: 96 U/L (ref 38–126)
Anion gap: 18 — ABNORMAL HIGH (ref 5–15)
BUN: 77 mg/dL — ABNORMAL HIGH (ref 6–20)
CO2: 24 mmol/L (ref 22–32)
Calcium: 9.2 mg/dL (ref 8.9–10.3)
Chloride: 96 mmol/L — ABNORMAL LOW (ref 98–111)
Creatinine, Ser: 13.84 mg/dL — ABNORMAL HIGH (ref 0.44–1.00)
GFR, Estimated: 3 mL/min — ABNORMAL LOW (ref >60)
Glucose, Bld: 111 mg/dL — ABNORMAL HIGH (ref 70–99)
Potassium: 5.8 mmol/L — ABNORMAL HIGH (ref 3.5–5.1)
Sodium: 138 mmol/L (ref 135–145)
Total Bilirubin: 1.5 mg/dL — ABNORMAL HIGH (ref 0.3–1.2)
Total Protein: 7.9 g/dL (ref 6.5–8.1)

## 2022-09-22 LAB — CBC WITH DIFFERENTIAL/PLATELET
Abs Immature Granulocytes: 0.05 10*3/uL (ref 0.00–0.07)
Basophils Absolute: 0.1 10*3/uL (ref 0.0–0.1)
Basophils Relative: 0 %
Eosinophils Absolute: 0.3 10*3/uL (ref 0.0–0.5)
Eosinophils Relative: 2 %
HCT: 37.7 % (ref 36.0–46.0)
Hemoglobin: 11.9 g/dL — ABNORMAL LOW (ref 12.0–15.0)
Immature Granulocytes: 0 %
Lymphocytes Relative: 15 %
Lymphs Abs: 2 10*3/uL (ref 0.7–4.0)
MCH: 30.6 pg (ref 26.0–34.0)
MCHC: 31.6 g/dL (ref 30.0–36.0)
MCV: 96.9 fL (ref 80.0–100.0)
Monocytes Absolute: 1 10*3/uL (ref 0.1–1.0)
Monocytes Relative: 7 %
Neutro Abs: 10.4 10*3/uL — ABNORMAL HIGH (ref 1.7–7.7)
Neutrophils Relative %: 76 %
Platelets: 294 10*3/uL (ref 150–400)
RBC: 3.89 MIL/uL (ref 3.87–5.11)
RDW: 14.5 % (ref 11.5–15.5)
WBC: 13.8 10*3/uL — ABNORMAL HIGH (ref 4.0–10.5)
nRBC: 0 % (ref 0.0–0.2)

## 2022-09-22 LAB — RESP PANEL BY RT-PCR (FLU A&B, COVID) ARPGX2
Influenza A by PCR: NEGATIVE
Influenza B by PCR: NEGATIVE
SARS Coronavirus 2 by RT PCR: NEGATIVE

## 2022-09-22 LAB — HEPATITIS B SURFACE ANTIGEN: Hepatitis B Surface Ag: NONREACTIVE

## 2022-09-22 LAB — HEPATITIS B CORE ANTIBODY, TOTAL: Hep B Core Total Ab: NONREACTIVE

## 2022-09-22 LAB — HEPATITIS C ANTIBODY: HCV Ab: NONREACTIVE

## 2022-09-22 LAB — BRAIN NATRIURETIC PEPTIDE: B Natriuretic Peptide: 432.8 pg/mL — ABNORMAL HIGH (ref 0.0–100.0)

## 2022-09-22 LAB — HEPATITIS B SURFACE ANTIBODY,QUALITATIVE: Hep B S Ab: NONREACTIVE

## 2022-09-22 MED ORDER — FUROSEMIDE 10 MG/ML IJ SOLN
40.0000 mg | Freq: Once | INTRAMUSCULAR | Status: AC
  Administered 2022-09-22: 40 mg via INTRAVENOUS
  Filled 2022-09-22: qty 4

## 2022-09-22 MED ORDER — CHLORHEXIDINE GLUCONATE CLOTH 2 % EX PADS: 6.0000 | MEDICATED_PAD | Freq: Every day | CUTANEOUS | Status: DC

## 2022-09-22 NOTE — ED Provider Notes (Signed)
[place] EMERGENCY DEPARTMENT Provider Note   CSN: [id number] Arrival date & time: 09/22/22  1043     History  Chief Complaint  Patient presents with   Shortness of Breath    Pamela Campbell is a 61 y.o. female. With pmh ESRD on HD tu/Th/sat, diabetes mellitus, obesity, chronic diastolic CHF who presents with worsening shortness of breath and cough.  Patient's last session of dialysis was this past Saturday and it was not a full session.  She goes [facility] eye dialysis [facility]  She is here today after deciding that she does not feel like her dialysis center takes off enough fluid as she continues to feel short of breath after dialysis sessions.  It is worse with laying down flat and ambulating.  She is still does make small amounts of urine.  She does not take Lasix currently.  Her weight today is 121.5 kg but her dry weight is 120.5 kg.  Denies any fever, URI symptoms, chest pain.   Shortness of Breath      Home Medications Prior to Admission medications   Medication Sig Start Date End Date Taking? Authorizing Provider  amLODipine (NORVASC) 10 MG tablet Take 1 tablet (10 mg total) by mouth daily. Patient taking differently: Take 5 mg by mouth 2 (two) times daily. 10/22/21  Yes Early Osmond, MD  aspirin EC 81 MG tablet Take 1 tablet (81 mg total) by mouth daily. Swallow whole. 10/22/21  Yes Early Osmond, MD  Guaifenesin (MUCUS RELIEF ER) 1200 MG TB12 Take 1,200 mg by mouth every 12 (twelve) hours.   Yes [provider]  hydrALAZINE (APRESOLINE) 25 MG tablet Take 1 tablet (25 mg total) by mouth 2 (two) times daily. 09/02/22 09/02/23 Yes Domenic Polite, MD  ondansetron (ZOFRAN-ODT) 4 MG disintegrating tablet Take 4 mg by mouth every 6 (six) hours as needed. 09/07/22  Yes [provider]  OVER THE COUNTER MEDICATION Take 1 tablet by mouth daily. Renaltab Zn ( multivit)   Yes [provider]  rosuvastatin (CRESTOR) 5 MG tablet Take  1 tablet (5 mg total) by mouth 2 (two) times a week. Patient taking differently: Take 5 mg by mouth 2 (two) times a week. Monday, Wednesday 08/06/22  Yes Weaver, Scott T, PA-C  sevelamer carbonate (RENVELA) 800 MG tablet Take 2,400 mg by mouth 3 (three) times daily with meals.   Yes [provider]  calcium carbonate (OS-CAL) 1250 (500 Ca) MG chewable tablet Chew 1 tablet by mouth daily.  08/01/20  [provider]      Allergies    Bean pod extract, Compazine [prochlorperazine edisylate], Nsaids, Sensipar [cinacalcet], Vioxx [rofecoxib], Auryxia [ferric citrate], and Other    Review of Systems   Review of Systems  Respiratory:  Positive for shortness of breath.     Physical Exam Updated Vital Signs BP (!) 187/86   Pulse 75   Temp 98.4 F (36.9 C) (Oral)   Resp (!) 22   Ht 5\' 4"  (1.626 m)   Wt 121.1 kg   LMP 02/02/2015   SpO2 94%   BMI 45.83 kg/m  Physical Exam Constitutional: Alert and oriented.  No acute distress, nontoxic acting appropriately Eyes: Conjunctivae are normal. ENT      Head: Normocephalic and atraumatic.      Nose: No congestion.      Mouth/Throat: Mucous membranes are moist.      Neck: No stridor. Cardiovascular: S1, S2,  Normal and symmetric distal  pulses are present in all extremities.Warm and well perfused. Respiratory: Mild tachypnea. Breath sounds are normal.  O2 sat low 90s on room air. Gastrointestinal: Soft and nontender. Musculoskeletal: Normal range of motion in all extremities. Neurologic: Normal speech and language. No gross focal neurologic deficits are appreciated. Skin: Skin is warm, dry and intact. No rash noted. Psychiatric: Mood and affect are normal. Speech and behavior are normal.  ED Results / Procedures / Treatments   Labs (all labs ordered are listed, but only abnormal results are displayed) Labs Reviewed  CBC WITH DIFFERENTIAL/PLATELET - Abnormal; Notable for the following components:      Result Value   WBC 13.8  (*)    Hemoglobin 11.9 (*)    Neutro Abs 10.4 (*)    All other components within normal limits  COMPREHENSIVE METABOLIC PANEL - Abnormal; Notable for the following components:   Potassium 5.8 (*)    Chloride 96 (*)    Glucose, Bld 111 (*)    BUN 77 (*)    Creatinine, Ser 13.84 (*)    Total Bilirubin 1.5 (*)    GFR, Estimated 3 (*)    Anion gap 18 (*)    All other components within normal limits  BRAIN NATRIURETIC PEPTIDE - Abnormal; Notable for the following components:   B Natriuretic Peptide 432.8 (*)    All other components within normal limits  RESP PANEL BY RT-PCR (FLU A&B, COVID) ARPGX2  HEPATITIS B SURFACE ANTIGEN  HEPATITIS B SURFACE ANTIBODY,QUALITATIVE  HEPATITIS B SURFACE ANTIBODY, QUANTITATIVE  HEPATITIS B CORE ANTIBODY, TOTAL  HEPATITIS C ANTIBODY    EKG EKG Interpretation  Date/Time:  Tuesday September 22 2022 12:01:56 EDT Ventricular Rate:  80 PR Interval:  150 QRS Duration: 80 QT Interval:  386 QTC Calculation: 445 R Axis:   6 Text Interpretation: Normal sinus rhythm with sinus arrhythmia Septal infarct , age undetermined Abnormal ECG When compared with ECG of 01-Sep-2022 09:32, PREVIOUS ECG IS PRESENT No acute changes Confirmed by Georgina Snell [phone]) on 09/22/2022 4:18:22 PM  Radiology DG Chest 2 View  Result Date: 09/22/2022 CLINICAL DATA:  sob EXAM: CHEST - 2 VIEW COMPARISON:  September 01, 2022 FINDINGS: The cardiomediastinal silhouette is unchanged in contour. No pleural effusion. No pneumothorax. Peribronchial cuffing with diffuse interstitial prominence. Visualized abdomen is unremarkable. Multilevel degenerative changes of the thoracic spine. IMPRESSION: Constellation of findings are favored to reflect mild pulmonary edema. Electronically Signed   By: Valentino Saxon M.D.   On: 09/22/2022 13:15    Procedures Procedures   Medications Ordered in ED Medications  Chlorhexidine Gluconate Cloth 2 % PADS 6 each (has no administration in time  range)  furosemide (LASIX) injection 40 mg (40 mg Intravenous Given 09/22/22 1747)    ED Course/ Medical Decision Making/ A&P Clinical Course as of 09/22/22 2153  Tue Sep 22, 2022  1859 Spoke with Dr. Moshe Cipro of nephrology who will reach out to dialysis team to see if she can be run tonight. [VB]    Clinical Course User Index [VB] Elgie Congo, MD                           Medical Decision Making Belenda Cruise Boggus is a 61 y.o. female. With pmh ESRD on HD tu/Th/sat, diabetes mellitus, obesity, chronic diastolic CHF who presents with worsening shortness of breath and cough.  Patient's last session of dialysis was this past Saturday and it was not a full session.  Patient presents with symptoms of shortness of breath orthopnea and dyspnea on exertion consistent with pulmonary edema as can be seen on chest x-ray.  There is no evidence of pneumonia or pneumothorax.  She still does make small amounts of urine and is due for a dialysis session today.  She is mildly hyperkalemic 5.8 without EKG changes.  BNP is up for 32.8 consistent with fluid overload.  No concern for atypical ACS with reassuring EKG normal sinus rhythm with no acute changes from prior.  Patient is symptomatic from pulmonary edema low 90s on RA put on 2L [place] and tachypneic.  I have ordered patient IV Lasix 40 mg and discussed case with Dr. Moshe Cipro of nephrology who will help arrange for patient to have inpatient dialysis today in the ER and then she will likely be able to be discharged home after.  Risk Prescription drug management.    Final Clinical Impression(s) / ED Diagnoses Final diagnoses:  Shortness of breath  Acute pulmonary edema [facility])    Rx / DC Orders ED Discharge Orders     None         Elgie Congo, MD 09/22/22 2153

## 2022-09-22 NOTE — ED Triage Notes (Signed)
Patient arrives in wheelchair by POV c/o feeling short of breath like she has extra fluid on her lungs since Saturday. Dialysis on T/TH/Sat with last treatment on Saturday. States they took her off an hour early on Saturday.Did not go to dialysis for her session today. Right arm restricted. Patient coughing in triage and states that happens when she has extra fluid on her.

## 2022-09-22 NOTE — ED Notes (Signed)
This Rn calls hemodialysis at this time who reports that they will call this RN when they're ready for the patient

## 2022-09-22 NOTE — Discharge Instructions (Signed)
You were seen for shortness of breath and found to have fluid on your lungs.  You were given IV diuresis and hemodialysis with improvement.  Make sure to go to your dialysis session as planned Thursday.  Come back for any severe worsening shortness of breath, chest pain, or any other symptoms concerning to you.

## 2022-09-22 NOTE — Progress Notes (Signed)
Pt known to me-  just had hosp discharged on 9/13 for volume overload and hyperkalemia- now comes back with same-  shortened HD on Saturday and came here instead of going to her oP unit today.   Plan will be for HD this evening with volume removal and correction of K and hopefully she will be able to be discharged after to resume HD at her OP unit on Thursday   HD at [place] 4 hours, 2 K, AVF, yes heparin-  EDW 120.5 but may be lower now   [employer]

## 2022-09-22 NOTE — ED Provider Triage Note (Signed)
Emergency Medicine Provider Triage Evaluation Note  Pamela Campbell , a 61 y.o. female  was evaluated in triage.  Pt complains of shortness of breath x2 days with nonproductive cough times yesterday.  Dialysis Tuesday Thursday Saturday, last dialysis on Saturday.  Patient felt too short of breath to go to dialysis today, denies any pain anywhere.  No fevers or chills that she is aware of..  Review of Systems  Per HPI  Physical Exam  BP (!) 174/86 (BP Location: Left Arm)   Pulse 79   Temp 98.6 F (37 C) (Oral)   Resp 17   Ht 5\' 4"  (1.626 m)   Wt 121.1 kg   LMP 02/02/2015   SpO2 95%   BMI 45.83 kg/m  Gen:   Awake, no distress   Resp:  Normal effort  MSK:   Moves extremities without difficulty  Other:  Coughing during exam.  Right upper extremity with AV fistula with palpable thrill  Medical Decision Making  Medically screening exam initiated at 12:25 PM.  Appropriate orders placed.  Pamela Campbell was informed that the remainder of the evaluation will be completed by another provider, this initial triage assessment does not replace that evaluation, and the importance of remaining in the ED until their evaluation is complete.     Sherrill Raring, PA-C 09/22/22 1226

## 2022-09-23 NOTE — ED Provider Notes (Signed)
  4:28 AM Returned from HD--  took off 2.5 L, pressure dropped somewhat when attempting more.  VSS upon return to the ED.  States she just feels "drained" and tired now which is typical after treatment.  Feel she is stable for discharge home.  Can resume normal OP dialysis schedule.  Return here for new concerns.   Larene Pickett, PA-C 09/23/22 4643    Merryl Hacker, MD 09/24/22 [phone]

## 2022-09-23 NOTE — ED Notes (Signed)
Call from dialysis nurse who states 2.5L drawn off, goal was 4.5L; reports when pt SBP gets below 140 she begins vomiting and pt was unable to tolerate more fluid to be drawn off

## 2022-09-24 LAB — HEPATITIS B SURFACE ANTIBODY, QUANTITATIVE: Hep B S AB Quant (Post): 9.5 m[IU]/mL — ABNORMAL LOW (ref >9.9)

## 2022-10-01 ENCOUNTER — Other Ambulatory Visit: Payer: Self-pay | Admitting: Family Medicine

## 2022-10-01 DIAGNOSIS — Z1231 Encounter for screening mammogram for malignant neoplasm of breast: Secondary | ICD-10-CM

## 2022-11-04 ENCOUNTER — Institutional Professional Consult (permissible substitution): Payer: Medicare HMO | Admitting: Pulmonary Disease

## 2022-11-09 ENCOUNTER — Ambulatory Visit: Payer: Medicare HMO | Attending: Physician Assistant

## 2022-11-09 DIAGNOSIS — N186 End stage renal disease: Secondary | ICD-10-CM

## 2022-11-09 DIAGNOSIS — I5032 Chronic diastolic (congestive) heart failure: Secondary | ICD-10-CM

## 2022-11-10 LAB — HEPATIC FUNCTION PANEL
ALT: 16 IU/L (ref 0–32)
AST: 17 IU/L (ref 0–40)
Albumin: 3.8 g/dL (ref 3.8–4.9)
Alkaline Phosphatase: 92 IU/L (ref 44–121)
Bilirubin Total: 0.3 mg/dL (ref 0.0–1.2)
Bilirubin, Direct: 0.11 mg/dL (ref 0.00–0.40)
Total Protein: 6.8 g/dL (ref 6.0–8.5)

## 2022-11-10 LAB — LIPID PANEL
Chol/HDL Ratio: 3 ratio (ref 0.0–4.4)
Cholesterol, Total: 141 mg/dL (ref 100–199)
HDL: 47 mg/dL (ref >39)
LDL Chol Calc (NIH): 62 mg/dL (ref 0–99)
Triglycerides: 193 mg/dL — ABNORMAL HIGH (ref 0–149)
VLDL Cholesterol Cal: 32 mg/dL (ref 5–40)

## 2022-11-18 ENCOUNTER — Ambulatory Visit (INDEPENDENT_AMBULATORY_CARE_PROVIDER_SITE_OTHER): Payer: Medicare HMO | Admitting: Pulmonary Disease

## 2022-11-18 ENCOUNTER — Encounter: Payer: Self-pay | Admitting: Pulmonary Disease

## 2022-11-18 ENCOUNTER — Ambulatory Visit (INDEPENDENT_AMBULATORY_CARE_PROVIDER_SITE_OTHER): Payer: Medicare HMO

## 2022-11-18 VITALS — HR 92 | Ht 64.0 in | Wt 261.0 lb

## 2022-11-18 DIAGNOSIS — R0602 Shortness of breath: Secondary | ICD-10-CM | POA: Diagnosis not present

## 2022-11-18 NOTE — Patient Instructions (Addendum)
Nice to meet you  Your chest x-ray today to me shows like fluid in the lungs, the fluid looks worse today than on your last chest x-ray x-in October  I tried to call your dialysis center but it was closed  I think we really need to try to continue to get off extra fluid with dialysis  If we can achieve a good and accurate dry weight, then we can move forward with additional testing if you continue to be short of breath after we get to a good fluid level.  We are working on getting new oxygen at home.  Use 3 L.  If you have an oxygen monitor, please use this and try to keep her oxygen over 88%.  You can increase or decrease the flow to achieve this.  Return to clinic in 3 months or sooner as needed with Dr. Silas Flood

## 2022-11-18 NOTE — Progress Notes (Signed)
@Patient  ID: Pamela Campbell, female    DOB: 09/12/1961, 61 y.o.   MRN: [id number]  Chief Complaint  Patient presents with   Consult    Pt si here for consult for SOB. Pt states she has been told often that fluid is building up on her lungs. Pt states that the SOB has been off and on for a while now. No inhalers noted at this time. Pt sounds very congested and wheezing.     Referring provider: Almedia Balls, NP  HPI:   61 y.o. woman whom we are seeing in evaluation of dyspnea on exertion.  Most recent PCP note reviewed.  Most recent ED note reviewed.  Patient presents with 80% oxygen saturations on room air.  She was placed on 2 L nasal cannula to main saturations in the low 90s.  Chest x-ray is obtained.  On my review interpretation shows worsened central venous prominence and worsening fluffy alveolar filling infiltrates bilaterally consistent with pulmonary edema when compared to x-ray 09/2022 this also personally reviewed interpreted.  She has been short of breath for a long time.  Her dry weight she says is 117 kg/h however she states she is losing weight which she has with her Ozempic.  Is possible dry but should be lower.  Today she is about 1.5 kg, over 3 pounds above her dry weight.  She becomes short of breath with minimal exertion.  Inclines or stairs are worse.  Flat surfaces can happen to.  No time of day when things are better or worse.  No position make things better or worse.  No seasonal environmental factors she can identify them things better or worse.  No other alleviating or exacerbating factors.  Long discussion today.  Discussing need for getting more fluid off the dialysis.  She states that when they start to draw more fluid her blood pressure drops below 150.  While she knows this is still high, she develops nausea, tunnel vision, presyncope.  So they stopped.  I am not giving fluid back..  Normal saline per her report.  She states when she goes to dialysis to put her on  oxygen.  By the time she stops dialysis session she is taken off oxygen.  She does not have home oxygen.  I discussed with the findings of chest x-ray and need for getting referral.  I think she needs dialysis now.  I recommend presentation to the ED.  She declines this.    Questionaires / Pulmonary Flowsheets:   ACT:      No data to display          MMRC:     No data to display          Epworth:      No data to display          Tests:   FENO:  No results found for: "NITRICOXIDE"  PFT:     No data to display          WALK:      No data to display          Imaging: Personally reviewed and as per EMR and discussion in this note No results found.  Lab Results: Personally reviewed CBC    Component Value Date/Time   WBC 13.8 (H) 09/22/2022 1226   RBC 3.89 09/22/2022 1226   HGB 11.9 (L) 09/22/2022 1226   HGB 12.0 11/14/2018 1107   HCT 37.7 09/22/2022 1226   HCT 36.1 11/14/2018 1107  PLT 294 09/22/2022 1226   PLT 299 11/14/2018 1107   MCV 96.9 09/22/2022 1226   MCV 86 11/14/2018 1107   MCH 30.6 09/22/2022 1226   MCHC 31.6 09/22/2022 1226   RDW 14.5 09/22/2022 1226   RDW 13.8 11/14/2018 1107   LYMPHSABS 2.0 09/22/2022 1226   LYMPHSABS 3.5 (H) 11/14/2018 1107   MONOABS 1.0 09/22/2022 1226   EOSABS 0.3 09/22/2022 1226   EOSABS 0.3 11/14/2018 1107   BASOSABS 0.1 09/22/2022 1226   BASOSABS 0.1 11/14/2018 1107    BMET    Component Value Date/Time   NA 138 09/22/2022 1226   NA 142 12/03/2021 0946   K 5.8 (H) 09/22/2022 1226   CL 96 (L) 09/22/2022 1226   CO2 24 09/22/2022 1226   GLUCOSE 111 (H) 09/22/2022 1226   BUN 77 (H) 09/22/2022 1226   BUN 35 (H) 12/03/2021 0946   CREATININE 13.84 (H) 09/22/2022 1226   CREATININE 2.18 (H) 12/03/2016 1141   CALCIUM 9.2 09/22/2022 1226   GFRNONAA 3 (L) 09/22/2022 1226   GFRNONAA 25 (L) 12/03/2016 1141   GFRAA 3 (L) 08/13/2020 1401   GFRAA 29 (L) 12/03/2016 1141    BNP    Component Value  Date/Time   BNP 432.8 (H) 09/22/2022 1300    ProBNP No results found for: "PROBNP"  Specialty Problems       Pulmonary Problems   Shortness of breath   Sleep apnea   Cough in adult   Acute viral bronchitis   Acute respiratory failure with hypoxia (HCC)    Allergies  Allergen Reactions   Bean Pod Extract Nausea And Vomiting   Compazine [Prochlorperazine Edisylate] Swelling and Other (See Comments)    "The tongue hardens and comes out of mouth" (Tardive dyskinesia) Also made the neck muscles twist.   Nsaids Other (See Comments)    WAS TOLD TO NOT TAKE THESE DUE TO KIDNEY ISSUES   Sensipar [Cinacalcet] Nausea And Vomiting    Causes vomiting   Vioxx [Rofecoxib] Other (See Comments)    Caused internal bleeding   Lorin Picket [Ferric Citrate] Other (See Comments)    extreme constipation   Other Other (See Comments)    Lorin Picket -- Binder caused EXTREME constipation    Immunization History  Administered Date(s) Administered   Hepatitis B, adult 01/14/2018, 02/17/2018, 03/17/2018, 04/16/2018, 08/18/2018   Influenza,inj,Quad PF,6+ Mos 03/26/2015, 10/25/2016, 09/05/2017, 09/14/2019   Pneumococcal Conjugate-13 11/23/2019   Pneumococcal Polysaccharide-23 10/28/2016, 12/28/2019   Tdap 07/17/2015    Past Medical History:  Diagnosis Date   Anemia    Chronic diastolic CHF (congestive heart failure) ([place])    a. 03/2015: echo w/ EF of 50-55%, no WMA, Grade 2 DD, trivial AI, mild MR.   Chronic kidney disease (CKD), stage V ([place])    dialysis tuesday, thursday saterday   Constipation    Diabetes mellitus without complication ([place])    CONTROLLED WITH DIET   Dyspnea    non lately   Edema, lower extremity    Hypertension    Overweight    Pneumonia 2016   WHILE HOSPIATLIZED   Shortness of breath     Tobacco History: Social History   Tobacco Use  Smoking Status Former   Types: Cigarettes   Quit date: 03/21/1998   Years since quitting: 24.6  Smokeless Tobacco Never   Counseling  given: Not Answered   Continue to not smoke  Outpatient Encounter Medications as of 11/18/2022  Medication Sig   amLODipine (NORVASC) 10 MG tablet Take 1  tablet (10 mg total) by mouth daily. (Patient taking differently: Take 5 mg by mouth 2 (two) times daily.)   aspirin EC 81 MG tablet Take 1 tablet (81 mg total) by mouth daily. Swallow whole.   Guaifenesin (MUCUS RELIEF ER) 1200 MG TB12 Take 1,200 mg by mouth every 12 (twelve) hours.   hydrALAZINE (APRESOLINE) 25 MG tablet Take 1 tablet (25 mg total) by mouth 2 (two) times daily.   losartan (COZAAR) 25 MG tablet Take 25 mg by mouth at bedtime.   ondansetron (ZOFRAN-ODT) 4 MG disintegrating tablet Take 4 mg by mouth every 6 (six) hours as needed.   OVER THE COUNTER MEDICATION Take 1 tablet by mouth daily. Renaltab Zn ( multivit)   OZEMPIC, 0.25 OR 0.5 MG/DOSE, 2 MG/3ML SOPN SMARTSIG:0.25 Milligram(s) SUB-Q Once a Week   rosuvastatin (CRESTOR) 5 MG tablet Take 1 tablet (5 mg total) by mouth 2 (two) times a week. (Patient taking differently: Take 5 mg by mouth 2 (two) times a week. Monday, Wednesday)   sevelamer carbonate (RENVELA) 800 MG tablet Take 2,400 mg by mouth 3 (three) times daily with meals.   [DISCONTINUED] calcium carbonate (OS-CAL) 1250 (500 Ca) MG chewable tablet Chew 1 tablet by mouth daily.   No facility-administered encounter medications on file as of 11/18/2022.     Review of Systems  Review of Systems  No chest pain with exertion.  No orthopnea or PND.  Comprehensive review of systems otherwise negative. Physical Exam  Pulse 92   Ht 5\' 4"  (1.626 m)   Wt 261 lb (118.4 kg)   LMP 02/02/2015   SpO2 95%   BMI 44.80 kg/m   Wt Readings from Last 5 Encounters:  11/18/22 261 lb (118.4 kg)  09/22/22 267 lb (121.1 kg)  09/02/22 266 lb 12.1 oz (121 kg)  08/05/22 270 lb (122.5 kg)  10/22/21 271 lb 12.8 oz (123.3 kg)    BMI Readings from Last 5 Encounters:  11/18/22 44.80 kg/m  09/22/22 45.83 kg/m  09/02/22 45.79  kg/m  08/05/22 46.35 kg/m  10/22/21 46.65 kg/m     Physical Exam General: Sitting in chair, in no acute distress Eyes: EOMI, no icterus Neck: Supple, no JVP appreciated sitting upright Pulmonary: Scattered crackles throughout, mild tachypnea Cardiovascular: Regular rate and rhythm, warm Abdomen: Nondistended, bowel sounds present MSK: No synovitis, no joint effusion Neuro: Normal gait, no weakness Psych: Normal mood, full affect   Assessment & Plan:   Shortness of breath: Chronic, likely multifactorial but certainly a large component of volume overload.  Also possible component of habitus, deconditioning.  Breath sounds normal.  Chest x-ray today and more recently reveals bilateral volume overload.  She needs more aggressive volume removal with dialysis.  Additional evaluation is very limited until we achieve true euvolemia.  Hypoxemic respiratory failure: In the setting of chest x-ray appears wet.  Pulmonary edema.  New prescription for oxygen today.  Attempted to call dialysis center.  No answer.  Would like to discuss increasing volume removal.  Versus more frequent volume removal with more frequent dialysis until euvolemia is achieved.  I advised her by recommendation of her to present to the ED for further evaluation of hypoxemia and possible expedited dialysis.  She declines this recommendation.   Return in about 3 months (around 02/18/2023).   Lanier Clam, MD 11/18/2022   This appointment required 66 minutes of patient care (this includes precharting, chart review, review of results, face-to-face care, etc.).

## 2023-01-21 DIAGNOSIS — D631 Anemia in chronic kidney disease: Secondary | ICD-10-CM | POA: Diagnosis not present

## 2023-01-21 DIAGNOSIS — N186 End stage renal disease: Secondary | ICD-10-CM | POA: Diagnosis not present

## 2023-01-21 DIAGNOSIS — N2581 Secondary hyperparathyroidism of renal origin: Secondary | ICD-10-CM | POA: Diagnosis not present

## 2023-01-23 DIAGNOSIS — N2581 Secondary hyperparathyroidism of renal origin: Secondary | ICD-10-CM | POA: Diagnosis not present

## 2023-01-23 DIAGNOSIS — D631 Anemia in chronic kidney disease: Secondary | ICD-10-CM | POA: Diagnosis not present

## 2023-01-23 DIAGNOSIS — N186 End stage renal disease: Secondary | ICD-10-CM | POA: Diagnosis not present

## 2023-01-26 NOTE — Progress Notes (Signed)
Cardiology Office Note:    Date:  02/01/2023   ID:  Pamela Campbell, DOB Jan 09, 1961, MRN [id number]  PCP:  Caren Macadam, MD   [place] Providers Cardiologist:  Lenna Sciara, MD Referring MD: Caren Macadam, MD   Chief Complaint/Reason for Referral: General cardiology follow-up  ASSESSMENT:    1. Chronic heart failure with preserved ejection fraction ([place])   2. ESRD on dialysis ([place])   3. Type 2 diabetes mellitus with complication, without long-term current use of insulin ([place])   4. Hypertension associated with diabetes ([place])   5. Hyperlipidemia associated with type 2 diabetes mellitus ([place])   6. Aortic atherosclerosis ([place])   7. BMI 45.0-49.9, adult ([place])     PLAN:    In order of problems listed above: 1.  Chronic diastolic heart failure: Not a candidate for SGLT2 inhibition given end-stage renal disease.  She did not respond to as needed metolazone.  Her volume status can only be managed with dialysis. 2.  End-stage renal disease on dialysis:  Continue current therapy as directed by Nephrology. 3.  Type 2 diabetes: Not a candidate for SGLT2 inhibitor; continue aspirin, and Crestor. 4.  Hypertension: Elevated today due to pain issues.  Will increase losartan to 50 mg.  The patient has her blood pressure continually checked at dialysis and her nephrologist actively manages her blood pressure medications. 5.  Hyperlipidemia: Will check lipid panel, LFTs, LP(a) today. 6.  Aortic atherosclerosis: Continue aspirin, statin, and strict blood pressure control. 7.  Elevated BMI: Continue Ozempic.             Dispo:  Return in about 1 year (around 02/02/2024).      Medication Adjustments/Labs and Tests Ordered: Current medicines are reviewed at length with the patient today.  Concerns regarding medicines are outlined above.  The following changes have been made:     Labs/tests ordered: Orders Placed This Encounter  Procedures   Lipid panel   Hepatic function panel    Lipoprotein A (LPA)    Medication Changes: Meds ordered this encounter  Medications   amLODipine (NORVASC) 5 MG tablet    Sig: Take 1 tablet (5 mg total) by mouth 2 (two) times daily.   losartan (COZAAR) 50 MG tablet    Sig: Take 1 tablet (50 mg total) by mouth daily.    Dispense:  90 tablet    Refill:  3     Current medicines are reviewed at length with the patient today.  The patient does not have concerns regarding medicines.   History of Present Illness:    FOCUSED PROBLEM LIST:   1.  Hypertension 2.  BMI 46 on Ozempic 3.  Type 2 diabetes not on insulin 4.  End-stage renal disease on dialysis Tuesday Thursday Saturday 5.  Chronic diastolic dysfunction; cardiac catheterization 2017 demonstrated mild obstructive coronary artery disease 6.  Hyperlipidemia 7.  Aortic atherosclerosis/calcification chest x-ray November 2023  November 2022 consultation:  Pamela Campbell is a 62 y.o. female with the indicated medical problems here for recommendations regarding shortness of breath.  She was admitted to the hospital in August after few days of missing dialysis.  She underwent dialysis and was discharged a few days later.  The patient is doing relatively well today.  She is going to dialysis on a regular basis.  She will get moderately short of breath on Monday after a long stretch without dialysis.  After dialysis she seems to feel relatively well.  Unfortunately her blood pressure remains  very high.  She tells me that she feels lightheaded and dizzy when her blood pressure is less than 160 mmHg.  She denies any chest pain, palpitations, paroxysmal nocturnal dyspnea, orthopnea.  She does have some mild lower extremity edema which is chronic.  She has had no bleeding or bruising episodes.  She has required no hospitalizations or emergency room visits since her last one in August.  Plan: Start atorvastatin 40 mg, losartan 12.5 mg, increase amlodipine and metolazone 5 mg as needed.  Today: In  the interim the patient was seen in our office in August.  She could not tolerate atorvastatin and was changed to Crestor 5 mg twice weekly.  Her blood pressure was well-controlled.  She was admitted in September for volume overload which was managed by BiPAP and dialysis.  Her biggest issue today is a right lower extremity sciatica.  She is in a lot of pain because of this.  She unfortunately had to miss some dialysis sessions because of this as well but is back on track in regards to dialysis.  She believes that the pain is what is causing her blood pressure to be elevated.  She denies any shortness of breath, chest pain, presyncope, syncope, severe bleeding, or signs or symptoms of stroke.  She has lost 30 pounds while on Mounjaro and is very happy about this.  She is otherwise well without complaints.          Current Medications: Current Meds  Medication Sig   amLODipine (NORVASC) 5 MG tablet Take 1 tablet (5 mg total) by mouth 2 (two) times daily.   aspirin EC 81 MG tablet Take 1 tablet (81 mg total) by mouth daily. Swallow whole.   Guaifenesin (MUCUS RELIEF ER) 1200 MG TB12 Take 1,200 mg by mouth every 12 (twelve) hours.   hydrALAZINE (APRESOLINE) 25 MG tablet Take 1 tablet (25 mg total) by mouth 2 (two) times daily.   losartan (COZAAR) 50 MG tablet Take 1 tablet (50 mg total) by mouth daily.   MOUNJARO 2.5 MG/0.5ML Pen Inject 2.5 mg into the skin once a week.   ondansetron (ZOFRAN-ODT) 4 MG disintegrating tablet Take 4 mg by mouth every 6 (six) hours as needed.   OVER THE COUNTER MEDICATION Take 1 tablet by mouth daily. Renaltab Zn ( multivit)   OZEMPIC, 0.25 OR 0.5 MG/DOSE, 2 MG/3ML SOPN SMARTSIG:0.25 Milligram(s) SUB-Q Once a Week   rosuvastatin (CRESTOR) 5 MG tablet Take 1 tablet (5 mg total) by mouth 2 (two) times a week. (Patient taking differently: Take 5 mg by mouth 2 (two) times a week. Monday, Wednesday)   sevelamer carbonate (RENVELA) 800 MG tablet Take 2,400 mg by mouth 3  (three) times daily with meals.   [DISCONTINUED] amLODipine (NORVASC) 5 MG tablet Take 5 mg by mouth 2 (two) times daily.   [DISCONTINUED] losartan (COZAAR) 25 MG tablet Take 25 mg by mouth at bedtime.     Allergies:    Bean pod extract, Compazine [prochlorperazine edisylate], Nsaids, Sensipar [cinacalcet], Vioxx [rofecoxib], Auryxia [ferric citrate], and Other   Social History:   Social History   Tobacco Use   Smoking status: Former    Types: Cigarettes    Quit date: 03/21/1998    Years since quitting: 24.8   Smokeless tobacco: Never  Vaping Use   Vaping Use: Never used  Substance Use Topics   Alcohol use: No   Drug use: No     Family Hx: Family History  Problem Relation Age of Onset  Diabetes Mellitus II Mother    Hypertension Mother    Hyperlipidemia Mother    Kidney disease Mother    Eating disorder Mother    Obesity Mother    Heart disease Father    Heart attack Father 10   Congestive Heart Failure Brother    Congestive Heart Failure Son      Review of Systems:   Please see the history of present illness.    All other systems reviewed and are negative.     EKGs/Labs/Other Test Reviewed:    EKG:  EKG performed October 2023 that I personally reviewed demonstrates rhythm with sinus arrhythmia.  Prior CV studies:  TTE 2022: 1. Left ventricular ejection fraction by 3D volume is 58 %. The left  ventricle has normal function. The left ventricle has no regional wall  motion abnormalities. There is moderate concentric left ventricular  hypertrophy. Left ventricular diastolic  parameters are indeterminate. Elevated left ventricular end-diastolic  pressure.   2. Right ventricular systolic function is normal. The right ventricular  size is normal. There is normal pulmonary artery systolic pressure.   3. There is a medium sized, calcified, slightly mobile mass on the atrial  aspect of the MV. This could represent a vegetation. Suggest TEE if  clinically indicated.  . The mitral valve is abnormal. Mild mitral valve  regurgitation.   4. The aortic valve is calcified. There is moderate calcification of the  aortic valve. Aortic valve regurgitation is not visualized. No aortic  stenosis is present.   Coronary angiography 2017: 1. Mild nonobstructive coronary artery disease 2. Elevated LVEDP  Other studies Reviewed: Review of the additional studies/records demonstrates: Chest x-ray 2023 demonstrating aortic atherosclerosis and calcification  Recent Labs: 09/22/2022: B Natriuretic Peptide 432.8; BUN 77; Creatinine, Ser 13.84; Hemoglobin 11.9; Platelets 294; Potassium 5.8; Sodium 138 11/09/2022: ALT 16   Recent Lipid Panel Lab Results  Component Value Date/Time   CHOL 141 11/09/2022 01:43 PM   TRIG 193 (H) 11/09/2022 01:43 PM   HDL 47 11/09/2022 01:43 PM   LDLCALC 62 11/09/2022 01:43 PM    Risk Assessment/Calculations:           HYPERTENSION CONTROL Vitals:   02/01/23 1034 02/01/23 1046  BP: (!) 194/93 (!) 180/80    The patient's blood pressure is elevated above target today.  In order to address the patient's elevated BP: A current anti-hypertensive medication was adjusted today.       Physical Exam:    VS:  BP (!) 180/80   Pulse 94   Ht 5' 4"$  (1.626 m)   Wt 251 lb 9.6 oz (114.1 kg)   LMP 02/02/2015   SpO2 97%   BMI 43.19 kg/m    Wt Readings from Last 3 Encounters:  02/01/23 251 lb 9.6 oz (114.1 kg)  11/18/22 261 lb (118.4 kg)  09/22/22 267 lb (121.1 kg)    GENERAL:  No apparent distress, AOx3 HEENT:  No carotid bruits, +2 carotid impulses, no scleral icterus CAR: RRR no murmurs, gallops, rubs, or thrills RES:  Clear to auscultation bilaterally ABD:  Soft, nontender, nondistended, positive bowel sounds x 4 VASC:  +2 radial pulses, +2 carotid pulses, palpable pedal pulses NEURO:  CN 2-12 grossly intact; motor and sensory grossly intact PSYCH:  No active depression or anxiety EXT:  No edema, ecchymosis, or  cyanosis  Signed, Early Osmond, MD  02/01/2023 10:57 AM    [address], [place],   [address] Phone: [phone]; Fax: [phone]   Note:  This document was prepared using Dragon voice recognition software and may include unintentional dictation errors.

## 2023-01-28 DIAGNOSIS — D631 Anemia in chronic kidney disease: Secondary | ICD-10-CM | POA: Diagnosis not present

## 2023-01-28 DIAGNOSIS — E1122 Type 2 diabetes mellitus with diabetic chronic kidney disease: Secondary | ICD-10-CM | POA: Diagnosis not present

## 2023-01-28 DIAGNOSIS — Z1159 Encounter for screening for other viral diseases: Secondary | ICD-10-CM | POA: Diagnosis not present

## 2023-01-28 DIAGNOSIS — N2581 Secondary hyperparathyroidism of renal origin: Secondary | ICD-10-CM | POA: Diagnosis not present

## 2023-01-28 DIAGNOSIS — N186 End stage renal disease: Secondary | ICD-10-CM | POA: Diagnosis not present

## 2023-01-28 DIAGNOSIS — Z114 Encounter for screening for human immunodeficiency virus [HIV]: Secondary | ICD-10-CM | POA: Diagnosis not present

## 2023-01-30 DIAGNOSIS — N186 End stage renal disease: Secondary | ICD-10-CM | POA: Diagnosis not present

## 2023-01-30 DIAGNOSIS — N2581 Secondary hyperparathyroidism of renal origin: Secondary | ICD-10-CM | POA: Diagnosis not present

## 2023-01-30 DIAGNOSIS — D631 Anemia in chronic kidney disease: Secondary | ICD-10-CM | POA: Diagnosis not present

## 2023-02-01 ENCOUNTER — Ambulatory Visit: Payer: Medicare HMO | Attending: Internal Medicine | Admitting: Internal Medicine

## 2023-02-01 ENCOUNTER — Encounter: Payer: Self-pay | Admitting: Internal Medicine

## 2023-02-01 VITALS — BP 180/80 | HR 94 | Ht 64.0 in | Wt 251.6 lb

## 2023-02-01 DIAGNOSIS — E1159 Type 2 diabetes mellitus with other circulatory complications: Secondary | ICD-10-CM

## 2023-02-01 DIAGNOSIS — Z992 Dependence on renal dialysis: Secondary | ICD-10-CM

## 2023-02-01 DIAGNOSIS — Z6841 Body Mass Index (BMI) 40.0 and over, adult: Secondary | ICD-10-CM

## 2023-02-01 DIAGNOSIS — E1169 Type 2 diabetes mellitus with other specified complication: Secondary | ICD-10-CM | POA: Diagnosis not present

## 2023-02-01 DIAGNOSIS — I152 Hypertension secondary to endocrine disorders: Secondary | ICD-10-CM

## 2023-02-01 DIAGNOSIS — N186 End stage renal disease: Secondary | ICD-10-CM

## 2023-02-01 DIAGNOSIS — I700 Atherosclerosis of aorta: Secondary | ICD-10-CM

## 2023-02-01 DIAGNOSIS — I5032 Chronic diastolic (congestive) heart failure: Secondary | ICD-10-CM

## 2023-02-01 DIAGNOSIS — E118 Type 2 diabetes mellitus with unspecified complications: Secondary | ICD-10-CM

## 2023-02-01 DIAGNOSIS — E785 Hyperlipidemia, unspecified: Secondary | ICD-10-CM

## 2023-02-01 MED ORDER — AMLODIPINE BESYLATE 5 MG PO TABS: 5.0000 mg | ORAL_TABLET | Freq: Two times a day (BID) | ORAL | Status: AC

## 2023-02-01 MED ORDER — LOSARTAN POTASSIUM 50 MG PO TABS: 50.0000 mg | ORAL_TABLET | Freq: Every day | ORAL | 3 refills | Status: AC

## 2023-02-01 NOTE — Patient Instructions (Addendum)
Medication Instructions:  Your physician has recommended you make the following change in your medication:   1) INCREASE losartan to 56m once daily  *If you need a refill on your cardiac medications before your next appointment, please call your pharmacy*  Lab Work: TODAY: Lipid panel, LFTs, Lipoprotein A If you have labs (blood work) drawn today and your tests are completely normal, you will receive your results only by: [place](if you have MyChart) OR A paper copy in the mail If you have any lab test that is abnormal or we need to change your treatment, we will call you to review the results.  Testing/Procedures: None  Follow-Up: [facility] you and your health needs are our priority.  As part of our continuing mission to provide you with exceptional heart care, we have created designated Provider Care Teams.  These Care Teams include your primary Cardiologist (physician) and Advanced Practice Providers (APPs -  Physician Assistants and Nurse Practitioners) who all work together to provide you with the care you need, when you need it.  Your next appointment:   1 year(s)  Provider:   TNicholes Rough PA-C, MChristen Bame NP, or SRichardson Dopp PA-C

## 2023-02-02 DIAGNOSIS — N2581 Secondary hyperparathyroidism of renal origin: Secondary | ICD-10-CM | POA: Diagnosis not present

## 2023-02-02 DIAGNOSIS — N186 End stage renal disease: Secondary | ICD-10-CM | POA: Diagnosis not present

## 2023-02-02 DIAGNOSIS — D631 Anemia in chronic kidney disease: Secondary | ICD-10-CM | POA: Diagnosis not present

## 2023-02-02 LAB — HEPATIC FUNCTION PANEL
ALT: 27 IU/L (ref 0–32)
AST: 17 IU/L (ref 0–40)
Albumin: 3.8 g/dL (ref 3.8–4.9)
Alkaline Phosphatase: 134 IU/L — ABNORMAL HIGH (ref 44–121)
Bilirubin Total: 0.2 mg/dL (ref 0.0–1.2)
Bilirubin, Direct: <0.1 mg/dL (ref 0.00–0.40)
Total Protein: 6.8 g/dL (ref 6.0–8.5)

## 2023-02-02 LAB — LIPID PANEL
Chol/HDL Ratio: 3 ratio (ref 0.0–4.4)
Cholesterol, Total: 131 mg/dL (ref 100–199)
HDL: 44 mg/dL (ref >39)
LDL Chol Calc (NIH): 69 mg/dL (ref 0–99)
Triglycerides: 98 mg/dL (ref 0–149)
VLDL Cholesterol Cal: 18 mg/dL (ref 5–40)

## 2023-02-02 LAB — LIPOPROTEIN A (LPA): Lipoprotein (a): 238.8 nmol/L — ABNORMAL HIGH (ref <75.0)

## 2023-02-03 DIAGNOSIS — I120 Hypertensive chronic kidney disease with stage 5 chronic kidney disease or end stage renal disease: Secondary | ICD-10-CM | POA: Diagnosis not present

## 2023-02-03 DIAGNOSIS — Z6841 Body Mass Index (BMI) 40.0 and over, adult: Secondary | ICD-10-CM | POA: Diagnosis not present

## 2023-02-03 DIAGNOSIS — E1122 Type 2 diabetes mellitus with diabetic chronic kidney disease: Secondary | ICD-10-CM | POA: Diagnosis not present

## 2023-02-06 DIAGNOSIS — N186 End stage renal disease: Secondary | ICD-10-CM | POA: Diagnosis not present

## 2023-02-06 DIAGNOSIS — N2581 Secondary hyperparathyroidism of renal origin: Secondary | ICD-10-CM | POA: Diagnosis not present

## 2023-02-06 DIAGNOSIS — D631 Anemia in chronic kidney disease: Secondary | ICD-10-CM | POA: Diagnosis not present

## 2023-02-08 ENCOUNTER — Telehealth: Payer: Self-pay | Admitting: *Deleted

## 2023-02-08 ENCOUNTER — Telehealth: Payer: Self-pay | Admitting: Internal Medicine

## 2023-02-08 DIAGNOSIS — E785 Hyperlipidemia, unspecified: Secondary | ICD-10-CM

## 2023-02-08 MED ORDER — ROSUVASTATIN CALCIUM 10 MG PO TABS: ORAL_TABLET | ORAL | 3 refills | Status: AC

## 2023-02-08 NOTE — Telephone Encounter (Signed)
Patient notified.  She will increase Rosuvastatin to 10 mg two times per week.(Currently taking 5 mg two times per week)  Prescription sent to [facility]  on [address].  Patient will come in for fasting lab work on April 09, 2023

## 2023-02-08 NOTE — Telephone Encounter (Signed)
-----   Message from Early Osmond, MD sent at 02/02/2023  8:48 PM EST ----- Lets increase crestor to 42m for goal LDL < 55, lipids and LFTS in 2 months.

## 2023-02-08 NOTE — Telephone Encounter (Signed)
See other phone call from today

## 2023-02-08 NOTE — Telephone Encounter (Signed)
Left message to call office

## 2023-02-08 NOTE — Telephone Encounter (Signed)
Patient was returning phone call 

## 2023-02-09 DIAGNOSIS — N2581 Secondary hyperparathyroidism of renal origin: Secondary | ICD-10-CM | POA: Diagnosis not present

## 2023-02-09 DIAGNOSIS — D631 Anemia in chronic kidney disease: Secondary | ICD-10-CM | POA: Diagnosis not present

## 2023-02-09 DIAGNOSIS — N186 End stage renal disease: Secondary | ICD-10-CM | POA: Diagnosis not present

## 2023-02-10 DIAGNOSIS — K5730 Diverticulosis of large intestine without perforation or abscess without bleeding: Secondary | ICD-10-CM | POA: Diagnosis not present

## 2023-02-10 DIAGNOSIS — D122 Benign neoplasm of ascending colon: Secondary | ICD-10-CM | POA: Diagnosis not present

## 2023-02-10 DIAGNOSIS — Z86010 Personal history of colonic polyps: Secondary | ICD-10-CM | POA: Diagnosis not present

## 2023-02-10 DIAGNOSIS — K644 Residual hemorrhoidal skin tags: Secondary | ICD-10-CM | POA: Diagnosis not present

## 2023-02-10 DIAGNOSIS — K648 Other hemorrhoids: Secondary | ICD-10-CM | POA: Diagnosis not present

## 2023-02-10 DIAGNOSIS — Z09 Encounter for follow-up examination after completed treatment for conditions other than malignant neoplasm: Secondary | ICD-10-CM | POA: Diagnosis not present

## 2023-02-12 DIAGNOSIS — N186 End stage renal disease: Secondary | ICD-10-CM | POA: Diagnosis not present

## 2023-02-12 DIAGNOSIS — N2581 Secondary hyperparathyroidism of renal origin: Secondary | ICD-10-CM | POA: Diagnosis not present

## 2023-02-12 DIAGNOSIS — D631 Anemia in chronic kidney disease: Secondary | ICD-10-CM | POA: Diagnosis not present

## 2023-02-12 DIAGNOSIS — M79604 Pain in right leg: Secondary | ICD-10-CM | POA: Diagnosis not present

## 2023-02-12 DIAGNOSIS — D122 Benign neoplasm of ascending colon: Secondary | ICD-10-CM | POA: Diagnosis not present

## 2023-02-12 DIAGNOSIS — R0602 Shortness of breath: Secondary | ICD-10-CM | POA: Diagnosis not present

## 2023-02-13 DIAGNOSIS — N2581 Secondary hyperparathyroidism of renal origin: Secondary | ICD-10-CM | POA: Diagnosis not present

## 2023-02-13 DIAGNOSIS — N186 End stage renal disease: Secondary | ICD-10-CM | POA: Diagnosis not present

## 2023-02-13 DIAGNOSIS — D631 Anemia in chronic kidney disease: Secondary | ICD-10-CM | POA: Diagnosis not present

## 2023-02-16 DIAGNOSIS — N2581 Secondary hyperparathyroidism of renal origin: Secondary | ICD-10-CM | POA: Diagnosis not present

## 2023-02-16 DIAGNOSIS — N186 End stage renal disease: Secondary | ICD-10-CM | POA: Diagnosis not present

## 2023-02-16 DIAGNOSIS — D631 Anemia in chronic kidney disease: Secondary | ICD-10-CM | POA: Diagnosis not present

## 2023-02-17 DIAGNOSIS — N950 Postmenopausal bleeding: Secondary | ICD-10-CM | POA: Diagnosis not present

## 2023-02-18 DIAGNOSIS — Z992 Dependence on renal dialysis: Secondary | ICD-10-CM | POA: Diagnosis not present

## 2023-02-18 DIAGNOSIS — N186 End stage renal disease: Secondary | ICD-10-CM | POA: Diagnosis not present

## 2023-02-18 DIAGNOSIS — N2581 Secondary hyperparathyroidism of renal origin: Secondary | ICD-10-CM | POA: Diagnosis not present

## 2023-02-18 DIAGNOSIS — D631 Anemia in chronic kidney disease: Secondary | ICD-10-CM | POA: Diagnosis not present

## 2023-02-20 DIAGNOSIS — N186 End stage renal disease: Secondary | ICD-10-CM | POA: Diagnosis not present

## 2023-02-20 DIAGNOSIS — D631 Anemia in chronic kidney disease: Secondary | ICD-10-CM | POA: Diagnosis not present

## 2023-02-20 DIAGNOSIS — N2581 Secondary hyperparathyroidism of renal origin: Secondary | ICD-10-CM | POA: Diagnosis not present

## 2023-02-23 ENCOUNTER — Other Ambulatory Visit: Payer: Self-pay | Admitting: Sports Medicine

## 2023-02-23 ENCOUNTER — Ambulatory Visit
Admission: RE | Admit: 2023-02-23 | Discharge: 2023-02-23 | Disposition: A | Discharge status: Home / Self Care | Payer: Medicare HMO | Source: Ambulatory Visit | Attending: Sports Medicine | Admitting: Sports Medicine

## 2023-02-23 DIAGNOSIS — M25551 Pain in right hip: Secondary | ICD-10-CM

## 2023-02-23 DIAGNOSIS — M545 Low back pain, unspecified: Secondary | ICD-10-CM

## 2023-02-23 DIAGNOSIS — N186 End stage renal disease: Secondary | ICD-10-CM | POA: Diagnosis not present

## 2023-02-23 DIAGNOSIS — N2581 Secondary hyperparathyroidism of renal origin: Secondary | ICD-10-CM | POA: Diagnosis not present

## 2023-02-23 DIAGNOSIS — D631 Anemia in chronic kidney disease: Secondary | ICD-10-CM | POA: Diagnosis not present

## 2023-02-24 ENCOUNTER — Ambulatory Visit
Admission: RE | Admit: 2023-02-24 | Discharge: 2023-02-24 | Disposition: A | Discharge status: Home / Self Care | Payer: Medicare HMO | Source: Ambulatory Visit | Attending: Family Medicine | Admitting: Family Medicine

## 2023-02-24 DIAGNOSIS — Z1231 Encounter for screening mammogram for malignant neoplasm of breast: Secondary | ICD-10-CM

## 2023-02-25 DIAGNOSIS — R110 Nausea: Secondary | ICD-10-CM | POA: Diagnosis not present

## 2023-02-25 DIAGNOSIS — Z6841 Body Mass Index (BMI) 40.0 and over, adult: Secondary | ICD-10-CM | POA: Diagnosis not present

## 2023-02-25 DIAGNOSIS — N2581 Secondary hyperparathyroidism of renal origin: Secondary | ICD-10-CM | POA: Diagnosis not present

## 2023-02-25 DIAGNOSIS — N186 End stage renal disease: Secondary | ICD-10-CM | POA: Diagnosis not present

## 2023-02-25 DIAGNOSIS — E1122 Type 2 diabetes mellitus with diabetic chronic kidney disease: Secondary | ICD-10-CM | POA: Diagnosis not present

## 2023-02-25 DIAGNOSIS — I120 Hypertensive chronic kidney disease with stage 5 chronic kidney disease or end stage renal disease: Secondary | ICD-10-CM | POA: Diagnosis not present

## 2023-02-25 DIAGNOSIS — D631 Anemia in chronic kidney disease: Secondary | ICD-10-CM | POA: Diagnosis not present

## 2023-03-01 ENCOUNTER — Other Ambulatory Visit: Payer: Self-pay | Admitting: Family Medicine

## 2023-03-01 DIAGNOSIS — R928 Other abnormal and inconclusive findings on diagnostic imaging of breast: Secondary | ICD-10-CM

## 2023-03-02 DIAGNOSIS — N186 End stage renal disease: Secondary | ICD-10-CM | POA: Diagnosis not present

## 2023-03-02 DIAGNOSIS — N2581 Secondary hyperparathyroidism of renal origin: Secondary | ICD-10-CM | POA: Diagnosis not present

## 2023-03-02 DIAGNOSIS — D631 Anemia in chronic kidney disease: Secondary | ICD-10-CM | POA: Diagnosis not present

## 2023-03-04 DIAGNOSIS — N2581 Secondary hyperparathyroidism of renal origin: Secondary | ICD-10-CM | POA: Diagnosis not present

## 2023-03-04 DIAGNOSIS — N186 End stage renal disease: Secondary | ICD-10-CM | POA: Diagnosis not present

## 2023-03-04 DIAGNOSIS — D631 Anemia in chronic kidney disease: Secondary | ICD-10-CM | POA: Diagnosis not present

## 2023-03-06 DIAGNOSIS — N2581 Secondary hyperparathyroidism of renal origin: Secondary | ICD-10-CM | POA: Diagnosis not present

## 2023-03-06 DIAGNOSIS — N186 End stage renal disease: Secondary | ICD-10-CM | POA: Diagnosis not present

## 2023-03-06 DIAGNOSIS — D631 Anemia in chronic kidney disease: Secondary | ICD-10-CM | POA: Diagnosis not present

## 2023-03-11 DIAGNOSIS — N186 End stage renal disease: Secondary | ICD-10-CM | POA: Diagnosis not present

## 2023-03-11 DIAGNOSIS — D631 Anemia in chronic kidney disease: Secondary | ICD-10-CM | POA: Diagnosis not present

## 2023-03-11 DIAGNOSIS — N2581 Secondary hyperparathyroidism of renal origin: Secondary | ICD-10-CM | POA: Diagnosis not present

## 2023-03-12 ENCOUNTER — Other Ambulatory Visit: Payer: Medicare HMO

## 2023-03-12 DIAGNOSIS — M5441 Lumbago with sciatica, right side: Secondary | ICD-10-CM | POA: Diagnosis not present

## 2023-03-13 DIAGNOSIS — N2581 Secondary hyperparathyroidism of renal origin: Secondary | ICD-10-CM | POA: Diagnosis not present

## 2023-03-13 DIAGNOSIS — N186 End stage renal disease: Secondary | ICD-10-CM | POA: Diagnosis not present

## 2023-03-13 DIAGNOSIS — D631 Anemia in chronic kidney disease: Secondary | ICD-10-CM | POA: Diagnosis not present

## 2023-03-16 DIAGNOSIS — N186 End stage renal disease: Secondary | ICD-10-CM | POA: Diagnosis not present

## 2023-03-16 DIAGNOSIS — N2581 Secondary hyperparathyroidism of renal origin: Secondary | ICD-10-CM | POA: Diagnosis not present

## 2023-03-16 DIAGNOSIS — D631 Anemia in chronic kidney disease: Secondary | ICD-10-CM | POA: Diagnosis not present

## 2023-03-18 DIAGNOSIS — D631 Anemia in chronic kidney disease: Secondary | ICD-10-CM | POA: Diagnosis not present

## 2023-03-18 DIAGNOSIS — N2581 Secondary hyperparathyroidism of renal origin: Secondary | ICD-10-CM | POA: Diagnosis not present

## 2023-03-18 DIAGNOSIS — N186 End stage renal disease: Secondary | ICD-10-CM | POA: Diagnosis not present

## 2023-03-19 DIAGNOSIS — M5451 Vertebrogenic low back pain: Secondary | ICD-10-CM | POA: Diagnosis not present

## 2023-03-20 DIAGNOSIS — D631 Anemia in chronic kidney disease: Secondary | ICD-10-CM | POA: Diagnosis not present

## 2023-03-20 DIAGNOSIS — N186 End stage renal disease: Secondary | ICD-10-CM | POA: Diagnosis not present

## 2023-03-20 DIAGNOSIS — N2581 Secondary hyperparathyroidism of renal origin: Secondary | ICD-10-CM | POA: Diagnosis not present

## 2023-03-21 DIAGNOSIS — N186 End stage renal disease: Secondary | ICD-10-CM | POA: Diagnosis not present

## 2023-03-21 DIAGNOSIS — Z992 Dependence on renal dialysis: Secondary | ICD-10-CM | POA: Diagnosis not present

## 2023-03-23 DIAGNOSIS — D631 Anemia in chronic kidney disease: Secondary | ICD-10-CM | POA: Diagnosis not present

## 2023-03-23 DIAGNOSIS — N2581 Secondary hyperparathyroidism of renal origin: Secondary | ICD-10-CM | POA: Diagnosis not present

## 2023-03-23 DIAGNOSIS — D509 Iron deficiency anemia, unspecified: Secondary | ICD-10-CM | POA: Diagnosis not present

## 2023-03-23 DIAGNOSIS — N186 End stage renal disease: Secondary | ICD-10-CM | POA: Diagnosis not present

## 2023-03-25 DIAGNOSIS — D509 Iron deficiency anemia, unspecified: Secondary | ICD-10-CM | POA: Diagnosis not present

## 2023-03-25 DIAGNOSIS — D631 Anemia in chronic kidney disease: Secondary | ICD-10-CM | POA: Diagnosis not present

## 2023-03-25 DIAGNOSIS — N2581 Secondary hyperparathyroidism of renal origin: Secondary | ICD-10-CM | POA: Diagnosis not present

## 2023-03-25 DIAGNOSIS — E1122 Type 2 diabetes mellitus with diabetic chronic kidney disease: Secondary | ICD-10-CM | POA: Diagnosis not present

## 2023-03-25 DIAGNOSIS — N186 End stage renal disease: Secondary | ICD-10-CM | POA: Diagnosis not present

## 2023-03-26 ENCOUNTER — Ambulatory Visit
Admission: RE | Admit: 2023-03-26 | Discharge: 2023-03-26 | Disposition: A | Discharge status: Home / Self Care | Payer: Medicare HMO | Source: Ambulatory Visit | Attending: Family Medicine | Admitting: Family Medicine

## 2023-03-26 ENCOUNTER — Other Ambulatory Visit: Payer: Self-pay | Admitting: Family Medicine

## 2023-03-26 DIAGNOSIS — R928 Other abnormal and inconclusive findings on diagnostic imaging of breast: Secondary | ICD-10-CM | POA: Diagnosis not present

## 2023-03-26 DIAGNOSIS — N6489 Other specified disorders of breast: Secondary | ICD-10-CM | POA: Diagnosis not present

## 2023-03-27 DIAGNOSIS — N2581 Secondary hyperparathyroidism of renal origin: Secondary | ICD-10-CM | POA: Diagnosis not present

## 2023-03-27 DIAGNOSIS — N186 End stage renal disease: Secondary | ICD-10-CM | POA: Diagnosis not present

## 2023-03-27 DIAGNOSIS — D631 Anemia in chronic kidney disease: Secondary | ICD-10-CM | POA: Diagnosis not present

## 2023-03-27 DIAGNOSIS — D509 Iron deficiency anemia, unspecified: Secondary | ICD-10-CM | POA: Diagnosis not present

## 2023-03-30 DIAGNOSIS — D509 Iron deficiency anemia, unspecified: Secondary | ICD-10-CM | POA: Diagnosis not present

## 2023-03-30 DIAGNOSIS — N186 End stage renal disease: Secondary | ICD-10-CM | POA: Diagnosis not present

## 2023-03-30 DIAGNOSIS — D631 Anemia in chronic kidney disease: Secondary | ICD-10-CM | POA: Diagnosis not present

## 2023-03-30 DIAGNOSIS — N2581 Secondary hyperparathyroidism of renal origin: Secondary | ICD-10-CM | POA: Diagnosis not present

## 2023-03-31 DIAGNOSIS — M5451 Vertebrogenic low back pain: Secondary | ICD-10-CM | POA: Diagnosis not present

## 2023-04-01 DIAGNOSIS — D631 Anemia in chronic kidney disease: Secondary | ICD-10-CM | POA: Diagnosis not present

## 2023-04-01 DIAGNOSIS — N2581 Secondary hyperparathyroidism of renal origin: Secondary | ICD-10-CM | POA: Diagnosis not present

## 2023-04-01 DIAGNOSIS — D509 Iron deficiency anemia, unspecified: Secondary | ICD-10-CM | POA: Diagnosis not present

## 2023-04-01 DIAGNOSIS — N186 End stage renal disease: Secondary | ICD-10-CM | POA: Diagnosis not present

## 2023-04-02 DIAGNOSIS — E1122 Type 2 diabetes mellitus with diabetic chronic kidney disease: Secondary | ICD-10-CM | POA: Diagnosis not present

## 2023-04-02 DIAGNOSIS — I700 Atherosclerosis of aorta: Secondary | ICD-10-CM | POA: Diagnosis not present

## 2023-04-02 DIAGNOSIS — Z6841 Body Mass Index (BMI) 40.0 and over, adult: Secondary | ICD-10-CM | POA: Diagnosis not present

## 2023-04-02 DIAGNOSIS — R0602 Shortness of breath: Secondary | ICD-10-CM | POA: Diagnosis not present

## 2023-04-02 DIAGNOSIS — J9611 Chronic respiratory failure with hypoxia: Secondary | ICD-10-CM | POA: Diagnosis not present

## 2023-04-02 DIAGNOSIS — Z992 Dependence on renal dialysis: Secondary | ICD-10-CM | POA: Diagnosis not present

## 2023-04-02 DIAGNOSIS — N186 End stage renal disease: Secondary | ICD-10-CM | POA: Diagnosis not present

## 2023-04-02 DIAGNOSIS — E782 Mixed hyperlipidemia: Secondary | ICD-10-CM | POA: Diagnosis not present

## 2023-04-02 DIAGNOSIS — I120 Hypertensive chronic kidney disease with stage 5 chronic kidney disease or end stage renal disease: Secondary | ICD-10-CM | POA: Diagnosis not present

## 2023-04-03 DIAGNOSIS — D509 Iron deficiency anemia, unspecified: Secondary | ICD-10-CM | POA: Diagnosis not present

## 2023-04-03 DIAGNOSIS — N2581 Secondary hyperparathyroidism of renal origin: Secondary | ICD-10-CM | POA: Diagnosis not present

## 2023-04-03 DIAGNOSIS — N186 End stage renal disease: Secondary | ICD-10-CM | POA: Diagnosis not present

## 2023-04-03 DIAGNOSIS — D631 Anemia in chronic kidney disease: Secondary | ICD-10-CM | POA: Diagnosis not present

## 2023-04-06 DIAGNOSIS — N2581 Secondary hyperparathyroidism of renal origin: Secondary | ICD-10-CM | POA: Diagnosis not present

## 2023-04-06 DIAGNOSIS — N186 End stage renal disease: Secondary | ICD-10-CM | POA: Diagnosis not present

## 2023-04-06 DIAGNOSIS — D509 Iron deficiency anemia, unspecified: Secondary | ICD-10-CM | POA: Diagnosis not present

## 2023-04-06 DIAGNOSIS — D631 Anemia in chronic kidney disease: Secondary | ICD-10-CM | POA: Diagnosis not present

## 2023-04-07 ENCOUNTER — Ambulatory Visit
Admission: RE | Admit: 2023-04-07 | Discharge: 2023-04-07 | Disposition: A | Discharge status: Home / Self Care | Payer: Medicare HMO | Source: Ambulatory Visit | Attending: Family Medicine | Admitting: Family Medicine

## 2023-04-07 DIAGNOSIS — D360 Benign neoplasm of lymph nodes: Secondary | ICD-10-CM | POA: Diagnosis not present

## 2023-04-07 DIAGNOSIS — R110 Nausea: Secondary | ICD-10-CM | POA: Diagnosis not present

## 2023-04-07 DIAGNOSIS — E1122 Type 2 diabetes mellitus with diabetic chronic kidney disease: Secondary | ICD-10-CM | POA: Diagnosis not present

## 2023-04-07 DIAGNOSIS — Z6841 Body Mass Index (BMI) 40.0 and over, adult: Secondary | ICD-10-CM | POA: Diagnosis not present

## 2023-04-07 DIAGNOSIS — R928 Other abnormal and inconclusive findings on diagnostic imaging of breast: Secondary | ICD-10-CM | POA: Diagnosis not present

## 2023-04-07 DIAGNOSIS — I10 Essential (primary) hypertension: Secondary | ICD-10-CM | POA: Diagnosis not present

## 2023-04-07 HISTORY — PX: BREAST BIOPSY: SHX20

## 2023-04-08 DIAGNOSIS — D631 Anemia in chronic kidney disease: Secondary | ICD-10-CM | POA: Diagnosis not present

## 2023-04-08 DIAGNOSIS — D509 Iron deficiency anemia, unspecified: Secondary | ICD-10-CM | POA: Diagnosis not present

## 2023-04-08 DIAGNOSIS — N2581 Secondary hyperparathyroidism of renal origin: Secondary | ICD-10-CM | POA: Diagnosis not present

## 2023-04-08 DIAGNOSIS — N186 End stage renal disease: Secondary | ICD-10-CM | POA: Diagnosis not present

## 2023-04-09 ENCOUNTER — Ambulatory Visit: Payer: Medicare HMO | Attending: Internal Medicine

## 2023-04-09 DIAGNOSIS — E785 Hyperlipidemia, unspecified: Secondary | ICD-10-CM | POA: Diagnosis not present

## 2023-04-09 DIAGNOSIS — M5451 Vertebrogenic low back pain: Secondary | ICD-10-CM | POA: Diagnosis not present

## 2023-04-10 DIAGNOSIS — D509 Iron deficiency anemia, unspecified: Secondary | ICD-10-CM | POA: Diagnosis not present

## 2023-04-10 DIAGNOSIS — N2581 Secondary hyperparathyroidism of renal origin: Secondary | ICD-10-CM | POA: Diagnosis not present

## 2023-04-10 DIAGNOSIS — D631 Anemia in chronic kidney disease: Secondary | ICD-10-CM | POA: Diagnosis not present

## 2023-04-10 DIAGNOSIS — N186 End stage renal disease: Secondary | ICD-10-CM | POA: Diagnosis not present

## 2023-04-10 LAB — HEPATIC FUNCTION PANEL
ALT: 13 IU/L (ref 0–32)
AST: 17 IU/L (ref 0–40)
Albumin: 3.9 g/dL (ref 3.8–4.9)
Alkaline Phosphatase: 130 IU/L — ABNORMAL HIGH (ref 44–121)
Bilirubin Total: 0.2 mg/dL (ref 0.0–1.2)
Bilirubin, Direct: <0.1 mg/dL (ref 0.00–0.40)
Total Protein: 6.7 g/dL (ref 6.0–8.5)

## 2023-04-10 LAB — LIPID PANEL
Chol/HDL Ratio: 2.4 ratio (ref 0.0–4.4)
Cholesterol, Total: 110 mg/dL (ref 100–199)
HDL: 46 mg/dL (ref >39)
LDL Chol Calc (NIH): 47 mg/dL (ref 0–99)
Triglycerides: 90 mg/dL (ref 0–149)
VLDL Cholesterol Cal: 17 mg/dL (ref 5–40)

## 2023-04-15 DIAGNOSIS — N186 End stage renal disease: Secondary | ICD-10-CM | POA: Diagnosis not present

## 2023-04-15 DIAGNOSIS — D509 Iron deficiency anemia, unspecified: Secondary | ICD-10-CM | POA: Diagnosis not present

## 2023-04-15 DIAGNOSIS — N2581 Secondary hyperparathyroidism of renal origin: Secondary | ICD-10-CM | POA: Diagnosis not present

## 2023-04-15 DIAGNOSIS — D631 Anemia in chronic kidney disease: Secondary | ICD-10-CM | POA: Diagnosis not present

## 2023-04-17 DIAGNOSIS — D509 Iron deficiency anemia, unspecified: Secondary | ICD-10-CM | POA: Diagnosis not present

## 2023-04-17 DIAGNOSIS — N186 End stage renal disease: Secondary | ICD-10-CM | POA: Diagnosis not present

## 2023-04-17 DIAGNOSIS — N2581 Secondary hyperparathyroidism of renal origin: Secondary | ICD-10-CM | POA: Diagnosis not present

## 2023-04-17 DIAGNOSIS — D631 Anemia in chronic kidney disease: Secondary | ICD-10-CM | POA: Diagnosis not present

## 2023-04-20 DIAGNOSIS — Z992 Dependence on renal dialysis: Secondary | ICD-10-CM | POA: Diagnosis not present

## 2023-04-20 DIAGNOSIS — N186 End stage renal disease: Secondary | ICD-10-CM | POA: Diagnosis not present

## 2023-04-20 DIAGNOSIS — D509 Iron deficiency anemia, unspecified: Secondary | ICD-10-CM | POA: Diagnosis not present

## 2023-04-20 DIAGNOSIS — N2581 Secondary hyperparathyroidism of renal origin: Secondary | ICD-10-CM | POA: Diagnosis not present

## 2023-04-20 DIAGNOSIS — D631 Anemia in chronic kidney disease: Secondary | ICD-10-CM | POA: Diagnosis not present

## 2023-04-21 DIAGNOSIS — M5451 Vertebrogenic low back pain: Secondary | ICD-10-CM | POA: Diagnosis not present

## 2023-04-22 DIAGNOSIS — N2581 Secondary hyperparathyroidism of renal origin: Secondary | ICD-10-CM | POA: Diagnosis not present

## 2023-04-22 DIAGNOSIS — E1122 Type 2 diabetes mellitus with diabetic chronic kidney disease: Secondary | ICD-10-CM | POA: Diagnosis not present

## 2023-04-22 DIAGNOSIS — D631 Anemia in chronic kidney disease: Secondary | ICD-10-CM | POA: Diagnosis not present

## 2023-04-22 DIAGNOSIS — N186 End stage renal disease: Secondary | ICD-10-CM | POA: Diagnosis not present

## 2023-04-24 DIAGNOSIS — D631 Anemia in chronic kidney disease: Secondary | ICD-10-CM | POA: Diagnosis not present

## 2023-04-24 DIAGNOSIS — N186 End stage renal disease: Secondary | ICD-10-CM | POA: Diagnosis not present

## 2023-04-24 DIAGNOSIS — N2581 Secondary hyperparathyroidism of renal origin: Secondary | ICD-10-CM | POA: Diagnosis not present

## 2023-04-27 DIAGNOSIS — D631 Anemia in chronic kidney disease: Secondary | ICD-10-CM | POA: Diagnosis not present

## 2023-04-27 DIAGNOSIS — N186 End stage renal disease: Secondary | ICD-10-CM | POA: Diagnosis not present

## 2023-04-27 DIAGNOSIS — N2581 Secondary hyperparathyroidism of renal origin: Secondary | ICD-10-CM | POA: Diagnosis not present

## 2023-04-28 DIAGNOSIS — E1122 Type 2 diabetes mellitus with diabetic chronic kidney disease: Secondary | ICD-10-CM | POA: Diagnosis not present

## 2023-04-28 DIAGNOSIS — I10 Essential (primary) hypertension: Secondary | ICD-10-CM | POA: Diagnosis not present

## 2023-04-28 DIAGNOSIS — R110 Nausea: Secondary | ICD-10-CM | POA: Diagnosis not present

## 2023-04-28 DIAGNOSIS — M5451 Vertebrogenic low back pain: Secondary | ICD-10-CM | POA: Diagnosis not present

## 2023-04-28 DIAGNOSIS — Z6841 Body Mass Index (BMI) 40.0 and over, adult: Secondary | ICD-10-CM | POA: Diagnosis not present

## 2023-04-29 DIAGNOSIS — N2581 Secondary hyperparathyroidism of renal origin: Secondary | ICD-10-CM | POA: Diagnosis not present

## 2023-04-29 DIAGNOSIS — D631 Anemia in chronic kidney disease: Secondary | ICD-10-CM | POA: Diagnosis not present

## 2023-04-29 DIAGNOSIS — N186 End stage renal disease: Secondary | ICD-10-CM | POA: Diagnosis not present

## 2023-05-01 DIAGNOSIS — N186 End stage renal disease: Secondary | ICD-10-CM | POA: Diagnosis not present

## 2023-05-01 DIAGNOSIS — D631 Anemia in chronic kidney disease: Secondary | ICD-10-CM | POA: Diagnosis not present

## 2023-05-01 DIAGNOSIS — N2581 Secondary hyperparathyroidism of renal origin: Secondary | ICD-10-CM | POA: Diagnosis not present

## 2023-05-04 DIAGNOSIS — N186 End stage renal disease: Secondary | ICD-10-CM | POA: Diagnosis not present

## 2023-05-04 DIAGNOSIS — D631 Anemia in chronic kidney disease: Secondary | ICD-10-CM | POA: Diagnosis not present

## 2023-05-04 DIAGNOSIS — N2581 Secondary hyperparathyroidism of renal origin: Secondary | ICD-10-CM | POA: Diagnosis not present

## 2023-05-05 DIAGNOSIS — M5451 Vertebrogenic low back pain: Secondary | ICD-10-CM | POA: Diagnosis not present

## 2023-05-08 DIAGNOSIS — D631 Anemia in chronic kidney disease: Secondary | ICD-10-CM | POA: Diagnosis not present

## 2023-05-08 DIAGNOSIS — N186 End stage renal disease: Secondary | ICD-10-CM | POA: Diagnosis not present

## 2023-05-08 DIAGNOSIS — N2581 Secondary hyperparathyroidism of renal origin: Secondary | ICD-10-CM | POA: Diagnosis not present

## 2023-05-11 DIAGNOSIS — D631 Anemia in chronic kidney disease: Secondary | ICD-10-CM | POA: Diagnosis not present

## 2023-05-11 DIAGNOSIS — N2581 Secondary hyperparathyroidism of renal origin: Secondary | ICD-10-CM | POA: Diagnosis not present

## 2023-05-11 DIAGNOSIS — N186 End stage renal disease: Secondary | ICD-10-CM | POA: Diagnosis not present

## 2023-05-13 DIAGNOSIS — D631 Anemia in chronic kidney disease: Secondary | ICD-10-CM | POA: Diagnosis not present

## 2023-05-13 DIAGNOSIS — N2581 Secondary hyperparathyroidism of renal origin: Secondary | ICD-10-CM | POA: Diagnosis not present

## 2023-05-13 DIAGNOSIS — N186 End stage renal disease: Secondary | ICD-10-CM | POA: Diagnosis not present

## 2023-05-18 DIAGNOSIS — N186 End stage renal disease: Secondary | ICD-10-CM | POA: Diagnosis not present

## 2023-05-18 DIAGNOSIS — D631 Anemia in chronic kidney disease: Secondary | ICD-10-CM | POA: Diagnosis not present

## 2023-05-18 DIAGNOSIS — N2581 Secondary hyperparathyroidism of renal origin: Secondary | ICD-10-CM | POA: Diagnosis not present

## 2023-05-19 DIAGNOSIS — N950 Postmenopausal bleeding: Secondary | ICD-10-CM | POA: Diagnosis not present

## 2023-05-19 DIAGNOSIS — N185 Chronic kidney disease, stage 5: Secondary | ICD-10-CM | POA: Diagnosis not present

## 2023-05-20 DIAGNOSIS — D631 Anemia in chronic kidney disease: Secondary | ICD-10-CM | POA: Diagnosis not present

## 2023-05-20 DIAGNOSIS — N2581 Secondary hyperparathyroidism of renal origin: Secondary | ICD-10-CM | POA: Diagnosis not present

## 2023-05-20 DIAGNOSIS — N186 End stage renal disease: Secondary | ICD-10-CM | POA: Diagnosis not present

## 2023-05-21 DIAGNOSIS — N186 End stage renal disease: Secondary | ICD-10-CM | POA: Diagnosis not present

## 2023-05-21 DIAGNOSIS — Z992 Dependence on renal dialysis: Secondary | ICD-10-CM | POA: Diagnosis not present

## 2023-05-22 DIAGNOSIS — D631 Anemia in chronic kidney disease: Secondary | ICD-10-CM | POA: Diagnosis not present

## 2023-05-22 DIAGNOSIS — N186 End stage renal disease: Secondary | ICD-10-CM | POA: Diagnosis not present

## 2023-05-22 DIAGNOSIS — N2581 Secondary hyperparathyroidism of renal origin: Secondary | ICD-10-CM | POA: Diagnosis not present

## 2023-05-25 DIAGNOSIS — N2581 Secondary hyperparathyroidism of renal origin: Secondary | ICD-10-CM | POA: Diagnosis not present

## 2023-05-25 DIAGNOSIS — N186 End stage renal disease: Secondary | ICD-10-CM | POA: Diagnosis not present

## 2023-05-25 DIAGNOSIS — D631 Anemia in chronic kidney disease: Secondary | ICD-10-CM | POA: Diagnosis not present

## 2023-05-26 DIAGNOSIS — Z6841 Body Mass Index (BMI) 40.0 and over, adult: Secondary | ICD-10-CM | POA: Diagnosis not present

## 2023-05-26 DIAGNOSIS — E1122 Type 2 diabetes mellitus with diabetic chronic kidney disease: Secondary | ICD-10-CM | POA: Diagnosis not present

## 2023-05-26 DIAGNOSIS — I10 Essential (primary) hypertension: Secondary | ICD-10-CM | POA: Diagnosis not present

## 2023-05-27 DIAGNOSIS — N2581 Secondary hyperparathyroidism of renal origin: Secondary | ICD-10-CM | POA: Diagnosis not present

## 2023-05-27 DIAGNOSIS — D631 Anemia in chronic kidney disease: Secondary | ICD-10-CM | POA: Diagnosis not present

## 2023-05-27 DIAGNOSIS — E1122 Type 2 diabetes mellitus with diabetic chronic kidney disease: Secondary | ICD-10-CM | POA: Diagnosis not present

## 2023-05-27 DIAGNOSIS — N186 End stage renal disease: Secondary | ICD-10-CM | POA: Diagnosis not present

## 2023-06-01 DIAGNOSIS — N186 End stage renal disease: Secondary | ICD-10-CM | POA: Diagnosis not present

## 2023-06-01 DIAGNOSIS — N2581 Secondary hyperparathyroidism of renal origin: Secondary | ICD-10-CM | POA: Diagnosis not present

## 2023-06-01 DIAGNOSIS — D631 Anemia in chronic kidney disease: Secondary | ICD-10-CM | POA: Diagnosis not present

## 2023-06-02 DIAGNOSIS — M5451 Vertebrogenic low back pain: Secondary | ICD-10-CM | POA: Diagnosis not present

## 2023-06-03 DIAGNOSIS — N2581 Secondary hyperparathyroidism of renal origin: Secondary | ICD-10-CM | POA: Diagnosis not present

## 2023-06-03 DIAGNOSIS — N186 End stage renal disease: Secondary | ICD-10-CM | POA: Diagnosis not present

## 2023-06-03 DIAGNOSIS — D631 Anemia in chronic kidney disease: Secondary | ICD-10-CM | POA: Diagnosis not present

## 2023-06-05 DIAGNOSIS — D631 Anemia in chronic kidney disease: Secondary | ICD-10-CM | POA: Diagnosis not present

## 2023-06-05 DIAGNOSIS — N186 End stage renal disease: Secondary | ICD-10-CM | POA: Diagnosis not present

## 2023-06-05 DIAGNOSIS — N2581 Secondary hyperparathyroidism of renal origin: Secondary | ICD-10-CM | POA: Diagnosis not present

## 2023-06-08 DIAGNOSIS — N2581 Secondary hyperparathyroidism of renal origin: Secondary | ICD-10-CM | POA: Diagnosis not present

## 2023-06-08 DIAGNOSIS — D631 Anemia in chronic kidney disease: Secondary | ICD-10-CM | POA: Diagnosis not present

## 2023-06-08 DIAGNOSIS — N186 End stage renal disease: Secondary | ICD-10-CM | POA: Diagnosis not present

## 2023-06-09 DIAGNOSIS — M5451 Vertebrogenic low back pain: Secondary | ICD-10-CM | POA: Diagnosis not present

## 2023-06-10 DIAGNOSIS — N2581 Secondary hyperparathyroidism of renal origin: Secondary | ICD-10-CM | POA: Diagnosis not present

## 2023-06-10 DIAGNOSIS — N186 End stage renal disease: Secondary | ICD-10-CM | POA: Diagnosis not present

## 2023-06-10 DIAGNOSIS — D631 Anemia in chronic kidney disease: Secondary | ICD-10-CM | POA: Diagnosis not present

## 2023-06-12 DIAGNOSIS — N2581 Secondary hyperparathyroidism of renal origin: Secondary | ICD-10-CM | POA: Diagnosis not present

## 2023-06-12 DIAGNOSIS — D631 Anemia in chronic kidney disease: Secondary | ICD-10-CM | POA: Diagnosis not present

## 2023-06-12 DIAGNOSIS — N186 End stage renal disease: Secondary | ICD-10-CM | POA: Diagnosis not present

## 2023-06-15 DIAGNOSIS — D631 Anemia in chronic kidney disease: Secondary | ICD-10-CM | POA: Diagnosis not present

## 2023-06-15 DIAGNOSIS — N2581 Secondary hyperparathyroidism of renal origin: Secondary | ICD-10-CM | POA: Diagnosis not present

## 2023-06-15 DIAGNOSIS — N186 End stage renal disease: Secondary | ICD-10-CM | POA: Diagnosis not present

## 2023-06-16 DIAGNOSIS — Z6841 Body Mass Index (BMI) 40.0 and over, adult: Secondary | ICD-10-CM | POA: Diagnosis not present

## 2023-06-16 DIAGNOSIS — E1122 Type 2 diabetes mellitus with diabetic chronic kidney disease: Secondary | ICD-10-CM | POA: Diagnosis not present

## 2023-06-16 DIAGNOSIS — I10 Essential (primary) hypertension: Secondary | ICD-10-CM | POA: Diagnosis not present

## 2023-06-16 DIAGNOSIS — M5451 Vertebrogenic low back pain: Secondary | ICD-10-CM | POA: Diagnosis not present

## 2023-06-17 DIAGNOSIS — N186 End stage renal disease: Secondary | ICD-10-CM | POA: Diagnosis not present

## 2023-06-17 DIAGNOSIS — N2581 Secondary hyperparathyroidism of renal origin: Secondary | ICD-10-CM | POA: Diagnosis not present

## 2023-06-17 DIAGNOSIS — D631 Anemia in chronic kidney disease: Secondary | ICD-10-CM | POA: Diagnosis not present

## 2023-06-19 DIAGNOSIS — N2581 Secondary hyperparathyroidism of renal origin: Secondary | ICD-10-CM | POA: Diagnosis not present

## 2023-06-19 DIAGNOSIS — N186 End stage renal disease: Secondary | ICD-10-CM | POA: Diagnosis not present

## 2023-06-19 DIAGNOSIS — D631 Anemia in chronic kidney disease: Secondary | ICD-10-CM | POA: Diagnosis not present

## 2023-06-20 DIAGNOSIS — N186 End stage renal disease: Secondary | ICD-10-CM | POA: Diagnosis not present

## 2023-06-20 DIAGNOSIS — Z992 Dependence on renal dialysis: Secondary | ICD-10-CM | POA: Diagnosis not present

## 2023-06-22 DIAGNOSIS — D631 Anemia in chronic kidney disease: Secondary | ICD-10-CM | POA: Diagnosis not present

## 2023-06-22 DIAGNOSIS — N186 End stage renal disease: Secondary | ICD-10-CM | POA: Diagnosis not present

## 2023-06-22 DIAGNOSIS — N2581 Secondary hyperparathyroidism of renal origin: Secondary | ICD-10-CM | POA: Diagnosis not present

## 2023-06-22 DIAGNOSIS — D509 Iron deficiency anemia, unspecified: Secondary | ICD-10-CM | POA: Diagnosis not present

## 2023-06-24 DIAGNOSIS — N2581 Secondary hyperparathyroidism of renal origin: Secondary | ICD-10-CM | POA: Diagnosis not present

## 2023-06-24 DIAGNOSIS — D509 Iron deficiency anemia, unspecified: Secondary | ICD-10-CM | POA: Diagnosis not present

## 2023-06-24 DIAGNOSIS — D631 Anemia in chronic kidney disease: Secondary | ICD-10-CM | POA: Diagnosis not present

## 2023-06-24 DIAGNOSIS — N186 End stage renal disease: Secondary | ICD-10-CM | POA: Diagnosis not present

## 2023-06-26 DIAGNOSIS — N2581 Secondary hyperparathyroidism of renal origin: Secondary | ICD-10-CM | POA: Diagnosis not present

## 2023-06-26 DIAGNOSIS — D631 Anemia in chronic kidney disease: Secondary | ICD-10-CM | POA: Diagnosis not present

## 2023-06-26 DIAGNOSIS — N186 End stage renal disease: Secondary | ICD-10-CM | POA: Diagnosis not present

## 2023-06-26 DIAGNOSIS — D509 Iron deficiency anemia, unspecified: Secondary | ICD-10-CM | POA: Diagnosis not present

## 2023-06-29 DIAGNOSIS — N186 End stage renal disease: Secondary | ICD-10-CM | POA: Diagnosis not present

## 2023-06-29 DIAGNOSIS — D631 Anemia in chronic kidney disease: Secondary | ICD-10-CM | POA: Diagnosis not present

## 2023-06-29 DIAGNOSIS — N2581 Secondary hyperparathyroidism of renal origin: Secondary | ICD-10-CM | POA: Diagnosis not present

## 2023-06-29 DIAGNOSIS — D509 Iron deficiency anemia, unspecified: Secondary | ICD-10-CM | POA: Diagnosis not present

## 2023-07-01 DIAGNOSIS — D509 Iron deficiency anemia, unspecified: Secondary | ICD-10-CM | POA: Diagnosis not present

## 2023-07-01 DIAGNOSIS — E1122 Type 2 diabetes mellitus with diabetic chronic kidney disease: Secondary | ICD-10-CM | POA: Diagnosis not present

## 2023-07-01 DIAGNOSIS — N2581 Secondary hyperparathyroidism of renal origin: Secondary | ICD-10-CM | POA: Diagnosis not present

## 2023-07-01 DIAGNOSIS — D631 Anemia in chronic kidney disease: Secondary | ICD-10-CM | POA: Diagnosis not present

## 2023-07-01 DIAGNOSIS — N186 End stage renal disease: Secondary | ICD-10-CM | POA: Diagnosis not present

## 2023-07-02 ENCOUNTER — Other Ambulatory Visit: Payer: Self-pay | Admitting: Obstetrics and Gynecology

## 2023-07-02 DIAGNOSIS — N185 Chronic kidney disease, stage 5: Secondary | ICD-10-CM | POA: Diagnosis not present

## 2023-07-02 DIAGNOSIS — N950 Postmenopausal bleeding: Secondary | ICD-10-CM | POA: Diagnosis not present

## 2023-07-06 DIAGNOSIS — D509 Iron deficiency anemia, unspecified: Secondary | ICD-10-CM | POA: Diagnosis not present

## 2023-07-06 DIAGNOSIS — N186 End stage renal disease: Secondary | ICD-10-CM | POA: Diagnosis not present

## 2023-07-06 DIAGNOSIS — D631 Anemia in chronic kidney disease: Secondary | ICD-10-CM | POA: Diagnosis not present

## 2023-07-06 DIAGNOSIS — N2581 Secondary hyperparathyroidism of renal origin: Secondary | ICD-10-CM | POA: Diagnosis not present

## 2023-07-07 DIAGNOSIS — M5451 Vertebrogenic low back pain: Secondary | ICD-10-CM | POA: Diagnosis not present

## 2023-07-08 DIAGNOSIS — N186 End stage renal disease: Secondary | ICD-10-CM | POA: Diagnosis not present

## 2023-07-08 DIAGNOSIS — D631 Anemia in chronic kidney disease: Secondary | ICD-10-CM | POA: Diagnosis not present

## 2023-07-08 DIAGNOSIS — N2581 Secondary hyperparathyroidism of renal origin: Secondary | ICD-10-CM | POA: Diagnosis not present

## 2023-07-08 DIAGNOSIS — D509 Iron deficiency anemia, unspecified: Secondary | ICD-10-CM | POA: Diagnosis not present

## 2023-07-10 DIAGNOSIS — N186 End stage renal disease: Secondary | ICD-10-CM | POA: Diagnosis not present

## 2023-07-10 DIAGNOSIS — N2581 Secondary hyperparathyroidism of renal origin: Secondary | ICD-10-CM | POA: Diagnosis not present

## 2023-07-10 DIAGNOSIS — D509 Iron deficiency anemia, unspecified: Secondary | ICD-10-CM | POA: Diagnosis not present

## 2023-07-10 DIAGNOSIS — D631 Anemia in chronic kidney disease: Secondary | ICD-10-CM | POA: Diagnosis not present

## 2023-07-13 DIAGNOSIS — D631 Anemia in chronic kidney disease: Secondary | ICD-10-CM | POA: Diagnosis not present

## 2023-07-13 DIAGNOSIS — D509 Iron deficiency anemia, unspecified: Secondary | ICD-10-CM | POA: Diagnosis not present

## 2023-07-13 DIAGNOSIS — N2581 Secondary hyperparathyroidism of renal origin: Secondary | ICD-10-CM | POA: Diagnosis not present

## 2023-07-13 DIAGNOSIS — N186 End stage renal disease: Secondary | ICD-10-CM | POA: Diagnosis not present

## 2023-07-14 DIAGNOSIS — Z6841 Body Mass Index (BMI) 40.0 and over, adult: Secondary | ICD-10-CM | POA: Diagnosis not present

## 2023-07-14 DIAGNOSIS — E1122 Type 2 diabetes mellitus with diabetic chronic kidney disease: Secondary | ICD-10-CM | POA: Diagnosis not present

## 2023-07-14 DIAGNOSIS — M5451 Vertebrogenic low back pain: Secondary | ICD-10-CM | POA: Diagnosis not present

## 2023-07-14 DIAGNOSIS — I10 Essential (primary) hypertension: Secondary | ICD-10-CM | POA: Diagnosis not present

## 2023-07-15 DIAGNOSIS — D509 Iron deficiency anemia, unspecified: Secondary | ICD-10-CM | POA: Diagnosis not present

## 2023-07-15 DIAGNOSIS — D631 Anemia in chronic kidney disease: Secondary | ICD-10-CM | POA: Diagnosis not present

## 2023-07-15 DIAGNOSIS — N186 End stage renal disease: Secondary | ICD-10-CM | POA: Diagnosis not present

## 2023-07-15 DIAGNOSIS — N2581 Secondary hyperparathyroidism of renal origin: Secondary | ICD-10-CM | POA: Diagnosis not present

## 2023-07-17 DIAGNOSIS — N186 End stage renal disease: Secondary | ICD-10-CM | POA: Diagnosis not present

## 2023-07-17 DIAGNOSIS — D509 Iron deficiency anemia, unspecified: Secondary | ICD-10-CM | POA: Diagnosis not present

## 2023-07-17 DIAGNOSIS — N2581 Secondary hyperparathyroidism of renal origin: Secondary | ICD-10-CM | POA: Diagnosis not present

## 2023-07-17 DIAGNOSIS — D631 Anemia in chronic kidney disease: Secondary | ICD-10-CM | POA: Diagnosis not present

## 2023-07-20 DIAGNOSIS — N2581 Secondary hyperparathyroidism of renal origin: Secondary | ICD-10-CM | POA: Diagnosis not present

## 2023-07-20 DIAGNOSIS — N186 End stage renal disease: Secondary | ICD-10-CM | POA: Diagnosis not present

## 2023-07-20 DIAGNOSIS — D509 Iron deficiency anemia, unspecified: Secondary | ICD-10-CM | POA: Diagnosis not present

## 2023-07-20 DIAGNOSIS — D631 Anemia in chronic kidney disease: Secondary | ICD-10-CM | POA: Diagnosis not present

## 2023-07-21 DIAGNOSIS — N186 End stage renal disease: Secondary | ICD-10-CM | POA: Diagnosis not present

## 2023-07-21 DIAGNOSIS — Z992 Dependence on renal dialysis: Secondary | ICD-10-CM | POA: Diagnosis not present

## 2023-07-22 DIAGNOSIS — D631 Anemia in chronic kidney disease: Secondary | ICD-10-CM | POA: Diagnosis not present

## 2023-07-22 DIAGNOSIS — D509 Iron deficiency anemia, unspecified: Secondary | ICD-10-CM | POA: Diagnosis not present

## 2023-07-22 DIAGNOSIS — N2581 Secondary hyperparathyroidism of renal origin: Secondary | ICD-10-CM | POA: Diagnosis not present

## 2023-07-22 DIAGNOSIS — N186 End stage renal disease: Secondary | ICD-10-CM | POA: Diagnosis not present

## 2023-07-24 DIAGNOSIS — N2581 Secondary hyperparathyroidism of renal origin: Secondary | ICD-10-CM | POA: Diagnosis not present

## 2023-07-24 DIAGNOSIS — N186 End stage renal disease: Secondary | ICD-10-CM | POA: Diagnosis not present

## 2023-07-24 DIAGNOSIS — D631 Anemia in chronic kidney disease: Secondary | ICD-10-CM | POA: Diagnosis not present

## 2023-07-24 DIAGNOSIS — D509 Iron deficiency anemia, unspecified: Secondary | ICD-10-CM | POA: Diagnosis not present

## 2023-07-27 DIAGNOSIS — N186 End stage renal disease: Secondary | ICD-10-CM | POA: Diagnosis not present

## 2023-07-27 DIAGNOSIS — N2581 Secondary hyperparathyroidism of renal origin: Secondary | ICD-10-CM | POA: Diagnosis not present

## 2023-07-27 DIAGNOSIS — D631 Anemia in chronic kidney disease: Secondary | ICD-10-CM | POA: Diagnosis not present

## 2023-07-27 DIAGNOSIS — D509 Iron deficiency anemia, unspecified: Secondary | ICD-10-CM | POA: Diagnosis not present

## 2023-07-29 DIAGNOSIS — E1122 Type 2 diabetes mellitus with diabetic chronic kidney disease: Secondary | ICD-10-CM | POA: Diagnosis not present

## 2023-07-29 DIAGNOSIS — N2581 Secondary hyperparathyroidism of renal origin: Secondary | ICD-10-CM | POA: Diagnosis not present

## 2023-07-29 DIAGNOSIS — N186 End stage renal disease: Secondary | ICD-10-CM | POA: Diagnosis not present

## 2023-07-29 DIAGNOSIS — D509 Iron deficiency anemia, unspecified: Secondary | ICD-10-CM | POA: Diagnosis not present

## 2023-07-29 DIAGNOSIS — D631 Anemia in chronic kidney disease: Secondary | ICD-10-CM | POA: Diagnosis not present

## 2023-07-30 DIAGNOSIS — M5451 Vertebrogenic low back pain: Secondary | ICD-10-CM | POA: Diagnosis not present

## 2023-07-31 DIAGNOSIS — N186 End stage renal disease: Secondary | ICD-10-CM | POA: Diagnosis not present

## 2023-07-31 DIAGNOSIS — N2581 Secondary hyperparathyroidism of renal origin: Secondary | ICD-10-CM | POA: Diagnosis not present

## 2023-07-31 DIAGNOSIS — D631 Anemia in chronic kidney disease: Secondary | ICD-10-CM | POA: Diagnosis not present

## 2023-07-31 DIAGNOSIS — D509 Iron deficiency anemia, unspecified: Secondary | ICD-10-CM | POA: Diagnosis not present

## 2023-08-02 DIAGNOSIS — Z992 Dependence on renal dialysis: Secondary | ICD-10-CM | POA: Diagnosis not present

## 2023-08-02 DIAGNOSIS — T82858A Stenosis of vascular prosthetic devices, implants and grafts, initial encounter: Secondary | ICD-10-CM | POA: Diagnosis not present

## 2023-08-02 DIAGNOSIS — T82590A Other mechanical complication of surgically created arteriovenous fistula, initial encounter: Secondary | ICD-10-CM | POA: Diagnosis not present

## 2023-08-02 DIAGNOSIS — N186 End stage renal disease: Secondary | ICD-10-CM | POA: Diagnosis not present

## 2023-08-02 DIAGNOSIS — Z87891 Personal history of nicotine dependence: Secondary | ICD-10-CM | POA: Diagnosis not present

## 2023-08-02 DIAGNOSIS — I871 Compression of vein: Secondary | ICD-10-CM | POA: Diagnosis not present

## 2023-08-03 DIAGNOSIS — D631 Anemia in chronic kidney disease: Secondary | ICD-10-CM | POA: Diagnosis not present

## 2023-08-03 DIAGNOSIS — N186 End stage renal disease: Secondary | ICD-10-CM | POA: Diagnosis not present

## 2023-08-03 DIAGNOSIS — N2581 Secondary hyperparathyroidism of renal origin: Secondary | ICD-10-CM | POA: Diagnosis not present

## 2023-08-03 DIAGNOSIS — D509 Iron deficiency anemia, unspecified: Secondary | ICD-10-CM | POA: Diagnosis not present

## 2023-08-07 DIAGNOSIS — N2581 Secondary hyperparathyroidism of renal origin: Secondary | ICD-10-CM | POA: Diagnosis not present

## 2023-08-07 DIAGNOSIS — N186 End stage renal disease: Secondary | ICD-10-CM | POA: Diagnosis not present

## 2023-08-07 DIAGNOSIS — D631 Anemia in chronic kidney disease: Secondary | ICD-10-CM | POA: Diagnosis not present

## 2023-08-07 DIAGNOSIS — D509 Iron deficiency anemia, unspecified: Secondary | ICD-10-CM | POA: Diagnosis not present

## 2023-08-10 DIAGNOSIS — D509 Iron deficiency anemia, unspecified: Secondary | ICD-10-CM | POA: Diagnosis not present

## 2023-08-10 DIAGNOSIS — N2581 Secondary hyperparathyroidism of renal origin: Secondary | ICD-10-CM | POA: Diagnosis not present

## 2023-08-10 DIAGNOSIS — D631 Anemia in chronic kidney disease: Secondary | ICD-10-CM | POA: Diagnosis not present

## 2023-08-10 DIAGNOSIS — N186 End stage renal disease: Secondary | ICD-10-CM | POA: Diagnosis not present

## 2023-08-11 DIAGNOSIS — M5451 Vertebrogenic low back pain: Secondary | ICD-10-CM | POA: Diagnosis not present

## 2023-08-12 DIAGNOSIS — N2581 Secondary hyperparathyroidism of renal origin: Secondary | ICD-10-CM | POA: Diagnosis not present

## 2023-08-12 DIAGNOSIS — D509 Iron deficiency anemia, unspecified: Secondary | ICD-10-CM | POA: Diagnosis not present

## 2023-08-12 DIAGNOSIS — D631 Anemia in chronic kidney disease: Secondary | ICD-10-CM | POA: Diagnosis not present

## 2023-08-12 DIAGNOSIS — N186 End stage renal disease: Secondary | ICD-10-CM | POA: Diagnosis not present

## 2023-08-14 DIAGNOSIS — N186 End stage renal disease: Secondary | ICD-10-CM | POA: Diagnosis not present

## 2023-08-14 DIAGNOSIS — N2581 Secondary hyperparathyroidism of renal origin: Secondary | ICD-10-CM | POA: Diagnosis not present

## 2023-08-14 DIAGNOSIS — D509 Iron deficiency anemia, unspecified: Secondary | ICD-10-CM | POA: Diagnosis not present

## 2023-08-14 DIAGNOSIS — D631 Anemia in chronic kidney disease: Secondary | ICD-10-CM | POA: Diagnosis not present

## 2023-08-17 DIAGNOSIS — N186 End stage renal disease: Secondary | ICD-10-CM | POA: Diagnosis not present

## 2023-08-17 DIAGNOSIS — D509 Iron deficiency anemia, unspecified: Secondary | ICD-10-CM | POA: Diagnosis not present

## 2023-08-17 DIAGNOSIS — N2581 Secondary hyperparathyroidism of renal origin: Secondary | ICD-10-CM | POA: Diagnosis not present

## 2023-08-17 DIAGNOSIS — D631 Anemia in chronic kidney disease: Secondary | ICD-10-CM | POA: Diagnosis not present

## 2023-08-18 DIAGNOSIS — E1122 Type 2 diabetes mellitus with diabetic chronic kidney disease: Secondary | ICD-10-CM | POA: Diagnosis not present

## 2023-08-18 DIAGNOSIS — Z6841 Body Mass Index (BMI) 40.0 and over, adult: Secondary | ICD-10-CM | POA: Diagnosis not present

## 2023-08-18 DIAGNOSIS — M5451 Vertebrogenic low back pain: Secondary | ICD-10-CM | POA: Diagnosis not present

## 2023-08-18 DIAGNOSIS — I10 Essential (primary) hypertension: Secondary | ICD-10-CM | POA: Diagnosis not present

## 2023-08-19 DIAGNOSIS — N186 End stage renal disease: Secondary | ICD-10-CM | POA: Diagnosis not present

## 2023-08-19 DIAGNOSIS — N2581 Secondary hyperparathyroidism of renal origin: Secondary | ICD-10-CM | POA: Diagnosis not present

## 2023-08-19 DIAGNOSIS — D509 Iron deficiency anemia, unspecified: Secondary | ICD-10-CM | POA: Diagnosis not present

## 2023-08-19 DIAGNOSIS — D631 Anemia in chronic kidney disease: Secondary | ICD-10-CM | POA: Diagnosis not present

## 2023-08-21 DIAGNOSIS — N2581 Secondary hyperparathyroidism of renal origin: Secondary | ICD-10-CM | POA: Diagnosis not present

## 2023-08-21 DIAGNOSIS — D631 Anemia in chronic kidney disease: Secondary | ICD-10-CM | POA: Diagnosis not present

## 2023-08-21 DIAGNOSIS — D509 Iron deficiency anemia, unspecified: Secondary | ICD-10-CM | POA: Diagnosis not present

## 2023-08-21 DIAGNOSIS — Z992 Dependence on renal dialysis: Secondary | ICD-10-CM | POA: Diagnosis not present

## 2023-08-21 DIAGNOSIS — N186 End stage renal disease: Secondary | ICD-10-CM | POA: Diagnosis not present

## 2023-08-26 DIAGNOSIS — Z23 Encounter for immunization: Secondary | ICD-10-CM | POA: Diagnosis not present

## 2023-08-26 DIAGNOSIS — N2581 Secondary hyperparathyroidism of renal origin: Secondary | ICD-10-CM | POA: Diagnosis not present

## 2023-08-26 DIAGNOSIS — D509 Iron deficiency anemia, unspecified: Secondary | ICD-10-CM | POA: Diagnosis not present

## 2023-08-26 DIAGNOSIS — E1122 Type 2 diabetes mellitus with diabetic chronic kidney disease: Secondary | ICD-10-CM | POA: Diagnosis not present

## 2023-08-26 DIAGNOSIS — D631 Anemia in chronic kidney disease: Secondary | ICD-10-CM | POA: Diagnosis not present

## 2023-08-26 DIAGNOSIS — N186 End stage renal disease: Secondary | ICD-10-CM | POA: Diagnosis not present

## 2023-08-28 DIAGNOSIS — Z23 Encounter for immunization: Secondary | ICD-10-CM | POA: Diagnosis not present

## 2023-08-28 DIAGNOSIS — N2581 Secondary hyperparathyroidism of renal origin: Secondary | ICD-10-CM | POA: Diagnosis not present

## 2023-08-28 DIAGNOSIS — D509 Iron deficiency anemia, unspecified: Secondary | ICD-10-CM | POA: Diagnosis not present

## 2023-08-28 DIAGNOSIS — N186 End stage renal disease: Secondary | ICD-10-CM | POA: Diagnosis not present

## 2023-08-28 DIAGNOSIS — D631 Anemia in chronic kidney disease: Secondary | ICD-10-CM | POA: Diagnosis not present

## 2023-08-31 DIAGNOSIS — D509 Iron deficiency anemia, unspecified: Secondary | ICD-10-CM | POA: Diagnosis not present

## 2023-08-31 DIAGNOSIS — N186 End stage renal disease: Secondary | ICD-10-CM | POA: Diagnosis not present

## 2023-08-31 DIAGNOSIS — N2581 Secondary hyperparathyroidism of renal origin: Secondary | ICD-10-CM | POA: Diagnosis not present

## 2023-08-31 DIAGNOSIS — D631 Anemia in chronic kidney disease: Secondary | ICD-10-CM | POA: Diagnosis not present

## 2023-08-31 DIAGNOSIS — Z23 Encounter for immunization: Secondary | ICD-10-CM | POA: Diagnosis not present

## 2023-09-02 DIAGNOSIS — N186 End stage renal disease: Secondary | ICD-10-CM | POA: Diagnosis not present

## 2023-09-02 DIAGNOSIS — N2581 Secondary hyperparathyroidism of renal origin: Secondary | ICD-10-CM | POA: Diagnosis not present

## 2023-09-02 DIAGNOSIS — D509 Iron deficiency anemia, unspecified: Secondary | ICD-10-CM | POA: Diagnosis not present

## 2023-09-02 DIAGNOSIS — D631 Anemia in chronic kidney disease: Secondary | ICD-10-CM | POA: Diagnosis not present

## 2023-09-02 DIAGNOSIS — Z23 Encounter for immunization: Secondary | ICD-10-CM | POA: Diagnosis not present

## 2023-09-04 DIAGNOSIS — D631 Anemia in chronic kidney disease: Secondary | ICD-10-CM | POA: Diagnosis not present

## 2023-09-04 DIAGNOSIS — N2581 Secondary hyperparathyroidism of renal origin: Secondary | ICD-10-CM | POA: Diagnosis not present

## 2023-09-04 DIAGNOSIS — N186 End stage renal disease: Secondary | ICD-10-CM | POA: Diagnosis not present

## 2023-09-04 DIAGNOSIS — D509 Iron deficiency anemia, unspecified: Secondary | ICD-10-CM | POA: Diagnosis not present

## 2023-09-04 DIAGNOSIS — Z23 Encounter for immunization: Secondary | ICD-10-CM | POA: Diagnosis not present

## 2023-09-07 DIAGNOSIS — N186 End stage renal disease: Secondary | ICD-10-CM | POA: Diagnosis not present

## 2023-09-07 DIAGNOSIS — N2581 Secondary hyperparathyroidism of renal origin: Secondary | ICD-10-CM | POA: Diagnosis not present

## 2023-09-07 DIAGNOSIS — Z23 Encounter for immunization: Secondary | ICD-10-CM | POA: Diagnosis not present

## 2023-09-07 DIAGNOSIS — D509 Iron deficiency anemia, unspecified: Secondary | ICD-10-CM | POA: Diagnosis not present

## 2023-09-07 DIAGNOSIS — D631 Anemia in chronic kidney disease: Secondary | ICD-10-CM | POA: Diagnosis not present

## 2023-09-08 DIAGNOSIS — M5451 Vertebrogenic low back pain: Secondary | ICD-10-CM | POA: Diagnosis not present

## 2023-09-08 DIAGNOSIS — E1122 Type 2 diabetes mellitus with diabetic chronic kidney disease: Secondary | ICD-10-CM | POA: Diagnosis not present

## 2023-09-08 DIAGNOSIS — I10 Essential (primary) hypertension: Secondary | ICD-10-CM | POA: Diagnosis not present

## 2023-09-08 DIAGNOSIS — Z6841 Body Mass Index (BMI) 40.0 and over, adult: Secondary | ICD-10-CM | POA: Diagnosis not present

## 2023-09-09 DIAGNOSIS — N2581 Secondary hyperparathyroidism of renal origin: Secondary | ICD-10-CM | POA: Diagnosis not present

## 2023-09-09 DIAGNOSIS — D509 Iron deficiency anemia, unspecified: Secondary | ICD-10-CM | POA: Diagnosis not present

## 2023-09-09 DIAGNOSIS — D631 Anemia in chronic kidney disease: Secondary | ICD-10-CM | POA: Diagnosis not present

## 2023-09-09 DIAGNOSIS — N186 End stage renal disease: Secondary | ICD-10-CM | POA: Diagnosis not present

## 2023-09-09 DIAGNOSIS — Z23 Encounter for immunization: Secondary | ICD-10-CM | POA: Diagnosis not present

## 2023-09-11 DIAGNOSIS — D631 Anemia in chronic kidney disease: Secondary | ICD-10-CM | POA: Diagnosis not present

## 2023-09-11 DIAGNOSIS — D509 Iron deficiency anemia, unspecified: Secondary | ICD-10-CM | POA: Diagnosis not present

## 2023-09-11 DIAGNOSIS — N2581 Secondary hyperparathyroidism of renal origin: Secondary | ICD-10-CM | POA: Diagnosis not present

## 2023-09-11 DIAGNOSIS — N186 End stage renal disease: Secondary | ICD-10-CM | POA: Diagnosis not present

## 2023-09-11 DIAGNOSIS — Z23 Encounter for immunization: Secondary | ICD-10-CM | POA: Diagnosis not present

## 2023-09-16 DIAGNOSIS — N186 End stage renal disease: Secondary | ICD-10-CM | POA: Diagnosis not present

## 2023-09-16 DIAGNOSIS — N2581 Secondary hyperparathyroidism of renal origin: Secondary | ICD-10-CM | POA: Diagnosis not present

## 2023-09-16 DIAGNOSIS — D509 Iron deficiency anemia, unspecified: Secondary | ICD-10-CM | POA: Diagnosis not present

## 2023-09-16 DIAGNOSIS — D631 Anemia in chronic kidney disease: Secondary | ICD-10-CM | POA: Diagnosis not present

## 2023-09-16 DIAGNOSIS — Z23 Encounter for immunization: Secondary | ICD-10-CM | POA: Diagnosis not present

## 2023-09-18 DIAGNOSIS — D509 Iron deficiency anemia, unspecified: Secondary | ICD-10-CM | POA: Diagnosis not present

## 2023-09-18 DIAGNOSIS — N186 End stage renal disease: Secondary | ICD-10-CM | POA: Diagnosis not present

## 2023-09-18 DIAGNOSIS — D631 Anemia in chronic kidney disease: Secondary | ICD-10-CM | POA: Diagnosis not present

## 2023-09-18 DIAGNOSIS — Z23 Encounter for immunization: Secondary | ICD-10-CM | POA: Diagnosis not present

## 2023-09-18 DIAGNOSIS — N2581 Secondary hyperparathyroidism of renal origin: Secondary | ICD-10-CM | POA: Diagnosis not present

## 2023-09-20 DIAGNOSIS — Z992 Dependence on renal dialysis: Secondary | ICD-10-CM | POA: Diagnosis not present

## 2023-09-20 DIAGNOSIS — N186 End stage renal disease: Secondary | ICD-10-CM | POA: Diagnosis not present

## 2023-09-21 DIAGNOSIS — D631 Anemia in chronic kidney disease: Secondary | ICD-10-CM | POA: Diagnosis not present

## 2023-09-21 DIAGNOSIS — D509 Iron deficiency anemia, unspecified: Secondary | ICD-10-CM | POA: Diagnosis not present

## 2023-09-21 DIAGNOSIS — N186 End stage renal disease: Secondary | ICD-10-CM | POA: Diagnosis not present

## 2023-09-21 DIAGNOSIS — N2581 Secondary hyperparathyroidism of renal origin: Secondary | ICD-10-CM | POA: Diagnosis not present

## 2023-09-23 DIAGNOSIS — D631 Anemia in chronic kidney disease: Secondary | ICD-10-CM | POA: Diagnosis not present

## 2023-09-23 DIAGNOSIS — N2581 Secondary hyperparathyroidism of renal origin: Secondary | ICD-10-CM | POA: Diagnosis not present

## 2023-09-23 DIAGNOSIS — D509 Iron deficiency anemia, unspecified: Secondary | ICD-10-CM | POA: Diagnosis not present

## 2023-09-23 DIAGNOSIS — Z992 Dependence on renal dialysis: Secondary | ICD-10-CM | POA: Diagnosis not present

## 2023-09-23 DIAGNOSIS — N186 End stage renal disease: Secondary | ICD-10-CM | POA: Diagnosis not present

## 2023-09-23 DIAGNOSIS — E1122 Type 2 diabetes mellitus with diabetic chronic kidney disease: Secondary | ICD-10-CM | POA: Diagnosis not present

## 2023-09-25 DIAGNOSIS — D631 Anemia in chronic kidney disease: Secondary | ICD-10-CM | POA: Diagnosis not present

## 2023-09-25 DIAGNOSIS — N2581 Secondary hyperparathyroidism of renal origin: Secondary | ICD-10-CM | POA: Diagnosis not present

## 2023-09-25 DIAGNOSIS — D509 Iron deficiency anemia, unspecified: Secondary | ICD-10-CM | POA: Diagnosis not present

## 2023-09-25 DIAGNOSIS — N186 End stage renal disease: Secondary | ICD-10-CM | POA: Diagnosis not present

## 2023-09-30 DIAGNOSIS — N186 End stage renal disease: Secondary | ICD-10-CM | POA: Diagnosis not present

## 2023-09-30 DIAGNOSIS — D631 Anemia in chronic kidney disease: Secondary | ICD-10-CM | POA: Diagnosis not present

## 2023-09-30 DIAGNOSIS — N2581 Secondary hyperparathyroidism of renal origin: Secondary | ICD-10-CM | POA: Diagnosis not present

## 2023-09-30 DIAGNOSIS — D509 Iron deficiency anemia, unspecified: Secondary | ICD-10-CM | POA: Diagnosis not present

## 2023-10-02 DIAGNOSIS — N186 End stage renal disease: Secondary | ICD-10-CM | POA: Diagnosis not present

## 2023-10-02 DIAGNOSIS — N2581 Secondary hyperparathyroidism of renal origin: Secondary | ICD-10-CM | POA: Diagnosis not present

## 2023-10-02 DIAGNOSIS — D509 Iron deficiency anemia, unspecified: Secondary | ICD-10-CM | POA: Diagnosis not present

## 2023-10-02 DIAGNOSIS — D631 Anemia in chronic kidney disease: Secondary | ICD-10-CM | POA: Diagnosis not present

## 2023-10-05 DIAGNOSIS — D509 Iron deficiency anemia, unspecified: Secondary | ICD-10-CM | POA: Diagnosis not present

## 2023-10-05 DIAGNOSIS — D631 Anemia in chronic kidney disease: Secondary | ICD-10-CM | POA: Diagnosis not present

## 2023-10-05 DIAGNOSIS — N186 End stage renal disease: Secondary | ICD-10-CM | POA: Diagnosis not present

## 2023-10-05 DIAGNOSIS — Z992 Dependence on renal dialysis: Secondary | ICD-10-CM | POA: Diagnosis not present

## 2023-10-05 DIAGNOSIS — N2581 Secondary hyperparathyroidism of renal origin: Secondary | ICD-10-CM | POA: Diagnosis not present

## 2023-10-06 DIAGNOSIS — I10 Essential (primary) hypertension: Secondary | ICD-10-CM | POA: Diagnosis not present

## 2023-10-06 DIAGNOSIS — E1122 Type 2 diabetes mellitus with diabetic chronic kidney disease: Secondary | ICD-10-CM | POA: Diagnosis not present

## 2023-10-06 DIAGNOSIS — Z6841 Body Mass Index (BMI) 40.0 and over, adult: Secondary | ICD-10-CM | POA: Diagnosis not present

## 2023-10-07 DIAGNOSIS — D631 Anemia in chronic kidney disease: Secondary | ICD-10-CM | POA: Diagnosis not present

## 2023-10-07 DIAGNOSIS — N186 End stage renal disease: Secondary | ICD-10-CM | POA: Diagnosis not present

## 2023-10-07 DIAGNOSIS — D509 Iron deficiency anemia, unspecified: Secondary | ICD-10-CM | POA: Diagnosis not present

## 2023-10-07 DIAGNOSIS — N2581 Secondary hyperparathyroidism of renal origin: Secondary | ICD-10-CM | POA: Diagnosis not present

## 2023-10-08 DIAGNOSIS — N186 End stage renal disease: Secondary | ICD-10-CM | POA: Diagnosis not present

## 2023-10-08 DIAGNOSIS — I10 Essential (primary) hypertension: Secondary | ICD-10-CM | POA: Diagnosis not present

## 2023-10-08 DIAGNOSIS — E782 Mixed hyperlipidemia: Secondary | ICD-10-CM | POA: Diagnosis not present

## 2023-10-08 DIAGNOSIS — N2581 Secondary hyperparathyroidism of renal origin: Secondary | ICD-10-CM | POA: Diagnosis not present

## 2023-10-08 DIAGNOSIS — I132 Hypertensive heart and chronic kidney disease with heart failure and with stage 5 chronic kidney disease, or end stage renal disease: Secondary | ICD-10-CM | POA: Diagnosis not present

## 2023-10-08 DIAGNOSIS — E1122 Type 2 diabetes mellitus with diabetic chronic kidney disease: Secondary | ICD-10-CM | POA: Diagnosis not present

## 2023-10-08 DIAGNOSIS — Z6841 Body Mass Index (BMI) 40.0 and over, adult: Secondary | ICD-10-CM | POA: Diagnosis not present

## 2023-10-08 DIAGNOSIS — Z0000 Encounter for general adult medical examination without abnormal findings: Secondary | ICD-10-CM | POA: Diagnosis not present

## 2023-10-08 DIAGNOSIS — I5030 Unspecified diastolic (congestive) heart failure: Secondary | ICD-10-CM | POA: Diagnosis not present

## 2023-10-09 DIAGNOSIS — N186 End stage renal disease: Secondary | ICD-10-CM | POA: Diagnosis not present

## 2023-10-09 DIAGNOSIS — D509 Iron deficiency anemia, unspecified: Secondary | ICD-10-CM | POA: Diagnosis not present

## 2023-10-09 DIAGNOSIS — D631 Anemia in chronic kidney disease: Secondary | ICD-10-CM | POA: Diagnosis not present

## 2023-10-09 DIAGNOSIS — N2581 Secondary hyperparathyroidism of renal origin: Secondary | ICD-10-CM | POA: Diagnosis not present

## 2023-10-11 ENCOUNTER — Other Ambulatory Visit: Payer: Self-pay | Admitting: Family Medicine

## 2023-10-11 DIAGNOSIS — Z13820 Encounter for screening for osteoporosis: Secondary | ICD-10-CM

## 2023-10-12 DIAGNOSIS — D509 Iron deficiency anemia, unspecified: Secondary | ICD-10-CM | POA: Diagnosis not present

## 2023-10-12 DIAGNOSIS — D631 Anemia in chronic kidney disease: Secondary | ICD-10-CM | POA: Diagnosis not present

## 2023-10-12 DIAGNOSIS — N186 End stage renal disease: Secondary | ICD-10-CM | POA: Diagnosis not present

## 2023-10-12 DIAGNOSIS — Z992 Dependence on renal dialysis: Secondary | ICD-10-CM | POA: Diagnosis not present

## 2023-10-12 DIAGNOSIS — N2581 Secondary hyperparathyroidism of renal origin: Secondary | ICD-10-CM | POA: Diagnosis not present

## 2023-10-14 DIAGNOSIS — D631 Anemia in chronic kidney disease: Secondary | ICD-10-CM | POA: Diagnosis not present

## 2023-10-14 DIAGNOSIS — N186 End stage renal disease: Secondary | ICD-10-CM | POA: Diagnosis not present

## 2023-10-14 DIAGNOSIS — D509 Iron deficiency anemia, unspecified: Secondary | ICD-10-CM | POA: Diagnosis not present

## 2023-10-14 DIAGNOSIS — N2581 Secondary hyperparathyroidism of renal origin: Secondary | ICD-10-CM | POA: Diagnosis not present

## 2023-10-16 DIAGNOSIS — N2581 Secondary hyperparathyroidism of renal origin: Secondary | ICD-10-CM | POA: Diagnosis not present

## 2023-10-16 DIAGNOSIS — D631 Anemia in chronic kidney disease: Secondary | ICD-10-CM | POA: Diagnosis not present

## 2023-10-16 DIAGNOSIS — D509 Iron deficiency anemia, unspecified: Secondary | ICD-10-CM | POA: Diagnosis not present

## 2023-10-16 DIAGNOSIS — N186 End stage renal disease: Secondary | ICD-10-CM | POA: Diagnosis not present

## 2023-10-21 DIAGNOSIS — D631 Anemia in chronic kidney disease: Secondary | ICD-10-CM | POA: Diagnosis not present

## 2023-10-21 DIAGNOSIS — N186 End stage renal disease: Secondary | ICD-10-CM | POA: Diagnosis not present

## 2023-10-21 DIAGNOSIS — N950 Postmenopausal bleeding: Secondary | ICD-10-CM | POA: Diagnosis not present

## 2023-10-21 DIAGNOSIS — I1A0 Resistant hypertension: Secondary | ICD-10-CM | POA: Diagnosis not present

## 2023-10-21 DIAGNOSIS — N2581 Secondary hyperparathyroidism of renal origin: Secondary | ICD-10-CM | POA: Diagnosis not present

## 2023-10-21 DIAGNOSIS — D509 Iron deficiency anemia, unspecified: Secondary | ICD-10-CM | POA: Diagnosis not present

## 2023-10-21 DIAGNOSIS — Z992 Dependence on renal dialysis: Secondary | ICD-10-CM | POA: Diagnosis not present

## 2023-10-21 DIAGNOSIS — Z6841 Body Mass Index (BMI) 40.0 and over, adult: Secondary | ICD-10-CM | POA: Diagnosis not present

## 2023-10-23 DIAGNOSIS — N186 End stage renal disease: Secondary | ICD-10-CM | POA: Diagnosis not present

## 2023-10-23 DIAGNOSIS — N2581 Secondary hyperparathyroidism of renal origin: Secondary | ICD-10-CM | POA: Diagnosis not present

## 2023-10-23 DIAGNOSIS — D509 Iron deficiency anemia, unspecified: Secondary | ICD-10-CM | POA: Diagnosis not present

## 2023-10-23 DIAGNOSIS — D631 Anemia in chronic kidney disease: Secondary | ICD-10-CM | POA: Diagnosis not present

## 2023-10-28 DIAGNOSIS — N186 End stage renal disease: Secondary | ICD-10-CM | POA: Diagnosis not present

## 2023-10-28 DIAGNOSIS — D509 Iron deficiency anemia, unspecified: Secondary | ICD-10-CM | POA: Diagnosis not present

## 2023-10-28 DIAGNOSIS — D631 Anemia in chronic kidney disease: Secondary | ICD-10-CM | POA: Diagnosis not present

## 2023-10-28 DIAGNOSIS — E1122 Type 2 diabetes mellitus with diabetic chronic kidney disease: Secondary | ICD-10-CM | POA: Diagnosis not present

## 2023-10-28 DIAGNOSIS — N2581 Secondary hyperparathyroidism of renal origin: Secondary | ICD-10-CM | POA: Diagnosis not present

## 2023-10-30 DIAGNOSIS — N186 End stage renal disease: Secondary | ICD-10-CM | POA: Diagnosis not present

## 2023-10-30 DIAGNOSIS — D509 Iron deficiency anemia, unspecified: Secondary | ICD-10-CM | POA: Diagnosis not present

## 2023-10-30 DIAGNOSIS — N2581 Secondary hyperparathyroidism of renal origin: Secondary | ICD-10-CM | POA: Diagnosis not present

## 2023-10-30 DIAGNOSIS — D631 Anemia in chronic kidney disease: Secondary | ICD-10-CM | POA: Diagnosis not present

## 2023-11-02 DIAGNOSIS — D631 Anemia in chronic kidney disease: Secondary | ICD-10-CM | POA: Diagnosis not present

## 2023-11-02 DIAGNOSIS — N2581 Secondary hyperparathyroidism of renal origin: Secondary | ICD-10-CM | POA: Diagnosis not present

## 2023-11-02 DIAGNOSIS — D509 Iron deficiency anemia, unspecified: Secondary | ICD-10-CM | POA: Diagnosis not present

## 2023-11-02 DIAGNOSIS — N186 End stage renal disease: Secondary | ICD-10-CM | POA: Diagnosis not present

## 2023-11-03 DIAGNOSIS — I10 Essential (primary) hypertension: Secondary | ICD-10-CM | POA: Diagnosis not present

## 2023-11-03 DIAGNOSIS — Z6841 Body Mass Index (BMI) 40.0 and over, adult: Secondary | ICD-10-CM | POA: Diagnosis not present

## 2023-11-03 DIAGNOSIS — E1122 Type 2 diabetes mellitus with diabetic chronic kidney disease: Secondary | ICD-10-CM | POA: Diagnosis not present

## 2023-11-04 DIAGNOSIS — D509 Iron deficiency anemia, unspecified: Secondary | ICD-10-CM | POA: Diagnosis not present

## 2023-11-04 DIAGNOSIS — N186 End stage renal disease: Secondary | ICD-10-CM | POA: Diagnosis not present

## 2023-11-04 DIAGNOSIS — N2581 Secondary hyperparathyroidism of renal origin: Secondary | ICD-10-CM | POA: Diagnosis not present

## 2023-11-04 DIAGNOSIS — D631 Anemia in chronic kidney disease: Secondary | ICD-10-CM | POA: Diagnosis not present

## 2023-11-05 DIAGNOSIS — N950 Postmenopausal bleeding: Secondary | ICD-10-CM | POA: Diagnosis not present

## 2023-11-06 DIAGNOSIS — N2581 Secondary hyperparathyroidism of renal origin: Secondary | ICD-10-CM | POA: Diagnosis not present

## 2023-11-06 DIAGNOSIS — D631 Anemia in chronic kidney disease: Secondary | ICD-10-CM | POA: Diagnosis not present

## 2023-11-06 DIAGNOSIS — D509 Iron deficiency anemia, unspecified: Secondary | ICD-10-CM | POA: Diagnosis not present

## 2023-11-06 DIAGNOSIS — N186 End stage renal disease: Secondary | ICD-10-CM | POA: Diagnosis not present

## 2023-11-09 DIAGNOSIS — D509 Iron deficiency anemia, unspecified: Secondary | ICD-10-CM | POA: Diagnosis not present

## 2023-11-09 DIAGNOSIS — N186 End stage renal disease: Secondary | ICD-10-CM | POA: Diagnosis not present

## 2023-11-09 DIAGNOSIS — N2581 Secondary hyperparathyroidism of renal origin: Secondary | ICD-10-CM | POA: Diagnosis not present

## 2023-11-09 DIAGNOSIS — D631 Anemia in chronic kidney disease: Secondary | ICD-10-CM | POA: Diagnosis not present

## 2023-11-13 DIAGNOSIS — N2581 Secondary hyperparathyroidism of renal origin: Secondary | ICD-10-CM | POA: Diagnosis not present

## 2023-11-13 DIAGNOSIS — D509 Iron deficiency anemia, unspecified: Secondary | ICD-10-CM | POA: Diagnosis not present

## 2023-11-13 DIAGNOSIS — D631 Anemia in chronic kidney disease: Secondary | ICD-10-CM | POA: Diagnosis not present

## 2023-11-13 DIAGNOSIS — N186 End stage renal disease: Secondary | ICD-10-CM | POA: Diagnosis not present

## 2023-11-15 DIAGNOSIS — D509 Iron deficiency anemia, unspecified: Secondary | ICD-10-CM | POA: Diagnosis not present

## 2023-11-15 DIAGNOSIS — N186 End stage renal disease: Secondary | ICD-10-CM | POA: Diagnosis not present

## 2023-11-15 DIAGNOSIS — D631 Anemia in chronic kidney disease: Secondary | ICD-10-CM | POA: Diagnosis not present

## 2023-11-15 DIAGNOSIS — N2581 Secondary hyperparathyroidism of renal origin: Secondary | ICD-10-CM | POA: Diagnosis not present

## 2023-11-17 DIAGNOSIS — D631 Anemia in chronic kidney disease: Secondary | ICD-10-CM | POA: Diagnosis not present

## 2023-11-17 DIAGNOSIS — N186 End stage renal disease: Secondary | ICD-10-CM | POA: Diagnosis not present

## 2023-11-17 DIAGNOSIS — N2581 Secondary hyperparathyroidism of renal origin: Secondary | ICD-10-CM | POA: Diagnosis not present

## 2023-11-17 DIAGNOSIS — D509 Iron deficiency anemia, unspecified: Secondary | ICD-10-CM | POA: Diagnosis not present

## 2023-11-20 DIAGNOSIS — N186 End stage renal disease: Secondary | ICD-10-CM | POA: Diagnosis not present

## 2023-11-20 DIAGNOSIS — Z992 Dependence on renal dialysis: Secondary | ICD-10-CM | POA: Diagnosis not present

## 2023-11-23 DIAGNOSIS — N186 End stage renal disease: Secondary | ICD-10-CM | POA: Diagnosis not present

## 2023-11-23 DIAGNOSIS — D631 Anemia in chronic kidney disease: Secondary | ICD-10-CM | POA: Diagnosis not present

## 2023-11-23 DIAGNOSIS — D509 Iron deficiency anemia, unspecified: Secondary | ICD-10-CM | POA: Diagnosis not present

## 2023-11-23 DIAGNOSIS — N2581 Secondary hyperparathyroidism of renal origin: Secondary | ICD-10-CM | POA: Diagnosis not present

## 2023-11-28 ENCOUNTER — Inpatient Hospital Stay (HOSPITAL_COMMUNITY): Payer: Medicare HMO

## 2023-11-28 ENCOUNTER — Emergency Department (HOSPITAL_COMMUNITY): Payer: Medicare HMO

## 2023-11-28 ENCOUNTER — Inpatient Hospital Stay (HOSPITAL_COMMUNITY)
Admission: EM | Admit: 2023-11-28 | Discharge: 2023-12-01 | DRG: 640 | Disposition: A | Discharge status: Home / Self Care | Payer: Medicare HMO | Attending: Family Medicine | Admitting: Family Medicine

## 2023-11-28 ENCOUNTER — Encounter (HOSPITAL_COMMUNITY): Payer: Self-pay | Admitting: Emergency Medicine

## 2023-11-28 ENCOUNTER — Other Ambulatory Visit: Payer: Self-pay

## 2023-11-28 DIAGNOSIS — I132 Hypertensive heart and chronic kidney disease with heart failure and with stage 5 chronic kidney disease, or end stage renal disease: Principal | ICD-10-CM | POA: Diagnosis present

## 2023-11-28 DIAGNOSIS — I120 Hypertensive chronic kidney disease with stage 5 chronic kidney disease or end stage renal disease: Secondary | ICD-10-CM | POA: Diagnosis not present

## 2023-11-28 DIAGNOSIS — N2581 Secondary hyperparathyroidism of renal origin: Secondary | ICD-10-CM | POA: Diagnosis present

## 2023-11-28 DIAGNOSIS — Z841 Family history of disorders of kidney and ureter: Secondary | ICD-10-CM

## 2023-11-28 DIAGNOSIS — N186 End stage renal disease: Secondary | ICD-10-CM | POA: Diagnosis not present

## 2023-11-28 DIAGNOSIS — Z9102 Food additives allergy status: Secondary | ICD-10-CM | POA: Diagnosis not present

## 2023-11-28 DIAGNOSIS — Z79899 Other long term (current) drug therapy: Secondary | ICD-10-CM

## 2023-11-28 DIAGNOSIS — Z87891 Personal history of nicotine dependence: Secondary | ICD-10-CM | POA: Diagnosis not present

## 2023-11-28 DIAGNOSIS — R0902 Hypoxemia: Secondary | ICD-10-CM | POA: Diagnosis not present

## 2023-11-28 DIAGNOSIS — R0989 Other specified symptoms and signs involving the circulatory and respiratory systems: Secondary | ICD-10-CM | POA: Diagnosis not present

## 2023-11-28 DIAGNOSIS — D631 Anemia in chronic kidney disease: Secondary | ICD-10-CM | POA: Diagnosis not present

## 2023-11-28 DIAGNOSIS — Z888 Allergy status to other drugs, medicaments and biological substances status: Secondary | ICD-10-CM | POA: Diagnosis not present

## 2023-11-28 DIAGNOSIS — I2510 Atherosclerotic heart disease of native coronary artery without angina pectoris: Secondary | ICD-10-CM | POA: Diagnosis present

## 2023-11-28 DIAGNOSIS — R9431 Abnormal electrocardiogram [ECG] [EKG]: Secondary | ICD-10-CM

## 2023-11-28 DIAGNOSIS — J9601 Acute respiratory failure with hypoxia: Secondary | ICD-10-CM | POA: Diagnosis present

## 2023-11-28 DIAGNOSIS — E1122 Type 2 diabetes mellitus with diabetic chronic kidney disease: Secondary | ICD-10-CM | POA: Diagnosis not present

## 2023-11-28 DIAGNOSIS — K3189 Other diseases of stomach and duodenum: Secondary | ICD-10-CM | POA: Diagnosis not present

## 2023-11-28 DIAGNOSIS — I5033 Acute on chronic diastolic (congestive) heart failure: Secondary | ICD-10-CM | POA: Diagnosis not present

## 2023-11-28 DIAGNOSIS — R1010 Upper abdominal pain, unspecified: Secondary | ICD-10-CM

## 2023-11-28 DIAGNOSIS — N281 Cyst of kidney, acquired: Secondary | ICD-10-CM | POA: Diagnosis present

## 2023-11-28 DIAGNOSIS — K920 Hematemesis: Secondary | ICD-10-CM | POA: Diagnosis present

## 2023-11-28 DIAGNOSIS — R109 Unspecified abdominal pain: Secondary | ICD-10-CM | POA: Diagnosis not present

## 2023-11-28 DIAGNOSIS — E785 Hyperlipidemia, unspecified: Secondary | ICD-10-CM | POA: Diagnosis not present

## 2023-11-28 DIAGNOSIS — I160 Hypertensive urgency: Secondary | ICD-10-CM | POA: Diagnosis present

## 2023-11-28 DIAGNOSIS — Z6841 Body Mass Index (BMI) 40.0 and over, adult: Secondary | ICD-10-CM | POA: Diagnosis not present

## 2023-11-28 DIAGNOSIS — Z886 Allergy status to analgesic agent status: Secondary | ICD-10-CM

## 2023-11-28 DIAGNOSIS — G8929 Other chronic pain: Secondary | ICD-10-CM | POA: Diagnosis present

## 2023-11-28 DIAGNOSIS — E872 Acidosis, unspecified: Secondary | ICD-10-CM | POA: Diagnosis present

## 2023-11-28 DIAGNOSIS — R748 Abnormal levels of other serum enzymes: Secondary | ICD-10-CM

## 2023-11-28 DIAGNOSIS — E8770 Fluid overload, unspecified: Principal | ICD-10-CM

## 2023-11-28 DIAGNOSIS — E875 Hyperkalemia: Principal | ICD-10-CM

## 2023-11-28 DIAGNOSIS — N200 Calculus of kidney: Secondary | ICD-10-CM | POA: Diagnosis not present

## 2023-11-28 DIAGNOSIS — Z7982 Long term (current) use of aspirin: Secondary | ICD-10-CM

## 2023-11-28 DIAGNOSIS — Z7985 Long-term (current) use of injectable non-insulin antidiabetic drugs: Secondary | ICD-10-CM

## 2023-11-28 DIAGNOSIS — R112 Nausea with vomiting, unspecified: Secondary | ICD-10-CM | POA: Diagnosis not present

## 2023-11-28 DIAGNOSIS — D638 Anemia in other chronic diseases classified elsewhere: Secondary | ICD-10-CM

## 2023-11-28 DIAGNOSIS — Z91158 Patient's noncompliance with renal dialysis for other reason: Secondary | ICD-10-CM

## 2023-11-28 DIAGNOSIS — R1013 Epigastric pain: Secondary | ICD-10-CM | POA: Diagnosis not present

## 2023-11-28 DIAGNOSIS — I5030 Unspecified diastolic (congestive) heart failure: Secondary | ICD-10-CM | POA: Diagnosis present

## 2023-11-28 DIAGNOSIS — Z8489 Family history of other specified conditions: Secondary | ICD-10-CM

## 2023-11-28 DIAGNOSIS — Z8249 Family history of ischemic heart disease and other diseases of the circulatory system: Secondary | ICD-10-CM

## 2023-11-28 DIAGNOSIS — R197 Diarrhea, unspecified: Secondary | ICD-10-CM | POA: Diagnosis not present

## 2023-11-28 DIAGNOSIS — Z992 Dependence on renal dialysis: Secondary | ICD-10-CM

## 2023-11-28 DIAGNOSIS — K2900 Acute gastritis without bleeding: Secondary | ICD-10-CM | POA: Diagnosis present

## 2023-11-28 DIAGNOSIS — Z83438 Family history of other disorder of lipoprotein metabolism and other lipidemia: Secondary | ICD-10-CM

## 2023-11-28 DIAGNOSIS — R5383 Other fatigue: Secondary | ICD-10-CM | POA: Diagnosis not present

## 2023-11-28 DIAGNOSIS — Z833 Family history of diabetes mellitus: Secondary | ICD-10-CM

## 2023-11-28 DIAGNOSIS — R7401 Elevation of levels of liver transaminase levels: Secondary | ICD-10-CM | POA: Diagnosis present

## 2023-11-28 DIAGNOSIS — K2970 Gastritis, unspecified, without bleeding: Secondary | ICD-10-CM | POA: Diagnosis not present

## 2023-11-28 DIAGNOSIS — R531 Weakness: Secondary | ICD-10-CM | POA: Diagnosis not present

## 2023-11-28 LAB — HEPATIC FUNCTION PANEL
ALT: 146 U/L — ABNORMAL HIGH (ref 0–44)
AST: 68 U/L — ABNORMAL HIGH (ref 15–41)
Albumin: 3.4 g/dL — ABNORMAL LOW (ref 3.5–5.0)
Alkaline Phosphatase: 135 U/L — ABNORMAL HIGH (ref 38–126)
Bilirubin, Direct: <0.1 mg/dL (ref 0.0–0.2)
Total Bilirubin: 0.6 mg/dL (ref <1.2)
Total Protein: 7.5 g/dL (ref 6.5–8.1)

## 2023-11-28 LAB — CBC
HCT: 27.4 % — ABNORMAL LOW (ref 36.0–46.0)
Hemoglobin: 8.5 g/dL — ABNORMAL LOW (ref 12.0–15.0)
MCH: 30.2 pg (ref 26.0–34.0)
MCHC: 31 g/dL (ref 30.0–36.0)
MCV: 97.5 fL (ref 80.0–100.0)
Platelets: 393 10*3/uL (ref 150–400)
RBC: 2.81 MIL/uL — ABNORMAL LOW (ref 3.87–5.11)
RDW: 17.2 % — ABNORMAL HIGH (ref 11.5–15.5)
WBC: 10.5 10*3/uL (ref 4.0–10.5)
nRBC: 0 % (ref 0.0–0.2)

## 2023-11-28 LAB — BASIC METABOLIC PANEL
Anion gap: 18 — ABNORMAL HIGH (ref 5–15)
BUN: 114 mg/dL — ABNORMAL HIGH (ref 8–23)
CO2: 22 mmol/L (ref 22–32)
Calcium: 9.7 mg/dL (ref 8.9–10.3)
Chloride: 97 mmol/L — ABNORMAL LOW (ref 98–111)
Creatinine, Ser: 17.32 mg/dL — ABNORMAL HIGH (ref 0.44–1.00)
GFR, Estimated: 2 mL/min — ABNORMAL LOW (ref >60)
Glucose, Bld: 126 mg/dL — ABNORMAL HIGH (ref 70–99)
Potassium: >7.5 mmol/L (ref 3.5–5.1)
Sodium: 137 mmol/L (ref 135–145)

## 2023-11-28 LAB — LIPASE, BLOOD: Lipase: 29 U/L (ref 11–51)

## 2023-11-28 MED ORDER — CALCIUM GLUCONATE-NACL 1-0.675 GM/50ML-% IV SOLN
1.0000 g | Freq: Once | INTRAVENOUS | Status: AC
  Administered 2023-11-28: 1000 mg via INTRAVENOUS
  Filled 2023-11-28: qty 50

## 2023-11-28 MED ORDER — ACETAMINOPHEN 325 MG PO TABS: 650.0000 mg | ORAL_TABLET | Freq: Four times a day (QID) | ORAL | Status: DC | PRN

## 2023-11-28 MED ORDER — ACETAMINOPHEN 650 MG RE SUPP: 650.0000 mg | Freq: Four times a day (QID) | RECTAL | Status: DC | PRN

## 2023-11-28 MED ORDER — ALBUTEROL SULFATE (2.5 MG/3ML) 0.083% IN NEBU: 2.5000 mg | INHALATION_SOLUTION | RESPIRATORY_TRACT | Status: DC | PRN

## 2023-11-28 MED ORDER — HEPARIN SODIUM (PORCINE) 5000 UNIT/ML IJ SOLN
5000.0000 [IU] | Freq: Three times a day (TID) | INTRAMUSCULAR | Status: DC
  Filled 2023-11-28 (×4): qty 1

## 2023-11-28 MED ORDER — CHLORHEXIDINE GLUCONATE CLOTH 2 % EX PADS: 6.0000 | MEDICATED_PAD | Freq: Every day | CUTANEOUS | Status: DC

## 2023-11-28 MED ORDER — DEXTROSE 50 % IV SOLN
1.0000 | Freq: Once | INTRAVENOUS | Status: AC
  Administered 2023-11-28: 50 mL via INTRAVENOUS
  Filled 2023-11-28: qty 50

## 2023-11-28 MED ORDER — SODIUM CHLORIDE 0.9% FLUSH
3.0000 mL | Freq: Two times a day (BID) | INTRAVENOUS | Status: DC
  Administered 2023-11-28 – 2023-12-01 (×7): 3 mL via INTRAVENOUS

## 2023-11-28 MED ORDER — SODIUM ZIRCONIUM CYCLOSILICATE 10 G PO PACK
10.0000 g | PACK | Freq: Once | ORAL | Status: AC
  Administered 2023-11-28: 10 g via ORAL
  Filled 2023-11-28: qty 1

## 2023-11-28 MED ORDER — INSULIN ASPART 100 UNIT/ML IJ SOLN
5.0000 [IU] | Freq: Once | INTRAMUSCULAR | Status: AC
  Administered 2023-11-28: 5 [IU] via INTRAVENOUS

## 2023-11-28 MED ORDER — HYDRALAZINE HCL 20 MG/ML IJ SOLN
10.0000 mg | Freq: Once | INTRAMUSCULAR | Status: AC
  Administered 2023-11-28: 10 mg via INTRAVENOUS
  Filled 2023-11-28: qty 1

## 2023-11-28 NOTE — Procedures (Signed)
Patient seen on Hemodialysis at 1400 today, late entry. BP (!) 161/77 (BP Location: Left Wrist)   Pulse 88   Temp 97.8 F (36.6 C) (Oral)   Resp 20   Ht 5\' 4"  (1.626 m)   Wt 104.7 kg   LMP 02/02/2015   SpO2 100%   BMI 39.62 kg/m   QB 300, UF goal 5 L Tolerating treatment without complaints at this time.   Zetta Bills MD [facility]. Office # [phone] Pager # [phone] 2:50 PM'

## 2023-11-28 NOTE — ED Notes (Addendum)
Renal NP at [facility]. Family x2 at [facility]. EDP into room, at [facility].

## 2023-11-28 NOTE — Progress Notes (Signed)
Pt had feels past out at last . BP was 109/66 mmhg. Pt goal met.UF off 4.1 L  11/28/23 1420  Vitals  Temp 97.8 F (36.6 C)  Temp Source Oral  BP (!) 161/77  BP Location Left Wrist  BP Method Automatic  Resp 20  Oxygen Therapy  O2 Device Nasal Cannula  O2 Flow Rate (L/min) 2 L/min  During Treatment Monitoring  Intra-Hemodialysis Comments Tx completed  Post Treatment  Dialyzer Clearance Lightly streaked  Hemodialysis Intake (mL) 0 mL  Liters Processed 82  Fluid Removed (mL) 4100 mL  Tolerated HD Treatment Yes  Post-Hemodialysis Comments Pt goal met.  AVG/AVF Arterial Site Held (minutes) 7 minutes  AVG/AVF Venous Site Held (minutes) 7 minutes

## 2023-11-28 NOTE — ED Triage Notes (Addendum)
Pt BIB form home for weakness & diarrhea & cough x 1 weak  T, TH, S, Dialysis. Also abdominal pain for "some time now."  Missed last 2 sessions.  SOB on exertion.  Pt thinks she has fluid on her lungs. EMS endorses clear lung sounds.  Non-compliant with medications.. Normal SBP is 170-200 per pt and family.  Pt feels weakest when standing.    Pt hs been on CPAP before, hx of heart failure but not any more per pt.  162 palp, HR 82 96% on 2L, CBG 128 . Pt normally only wears 02 when getting dialysis.

## 2023-11-28 NOTE — Progress Notes (Signed)
Pt transported from ED29 to ED46 by RN and RT w/o complications

## 2023-11-28 NOTE — ED Notes (Signed)
C/o sob worsening, EDP notified, Bipap ordered, RT notified, RR 30, SPO2 100% on 2.5L.

## 2023-11-28 NOTE — ED Notes (Signed)
RT and bipap at [facility]

## 2023-11-28 NOTE — ED Notes (Addendum)
Calcium infusing, xray at [facility]. EDP present.

## 2023-11-28 NOTE — ED Notes (Signed)
Consent signed with pt

## 2023-11-28 NOTE — Consult Note (Signed)
[facility] Renal Consultation Note    Indication for Consultation:  Management of ESRD/hemodialysis; anemia, hypertension/volume and secondary hyperparathyroidism  ZOX:WRUEAV, Fleet Contras, MD  HPI: Pamela Campbell is a 62 y.o. female with ESRD on HD TTS at [facility]. She has a past medical history significant for CAD and HTN.  She presents to the ED c/o shortness of breath. Family members were also at the bedside. Per family, patient missed two hemodialysis sessions (12/5 and 12/7) d/t ongoing ABD pain. While in the ED, her respiratory status worsened requiring Bipap. K+ was >7.5 and CXR shows findings consistent for CHF. Seen and examined patient at bedside. Her breathing is labored and in acute respiratory distress. RT at bedside. Plan for emergent HD today in the ED. UFG set for 4-5L.   Past Medical History:  Diagnosis Date   Anemia    Chronic diastolic CHF (congestive heart failure) (HCC)    a. 03/2015: echo w/ EF of 50-55%, no WMA, Grade 2 DD, trivial AI, mild MR.   Chronic kidney disease (CKD), stage V (HCC)    dialysis tuesday, thursday saterday   Constipation    Diabetes mellitus without complication (HCC)    CONTROLLED WITH DIET   Dyspnea    non lately   Edema, lower extremity    Hypertension    Overweight    Pneumonia 2016   WHILE HOSPIATLIZED   Shortness of breath    Past Surgical History:  Procedure Laterality Date   AV FISTULA PLACEMENT Right 01/04/2018   Procedure: ARTERIOVENOUS (AV) FISTULA CREATION RIGHT ARM;  Surgeon: Fransisco Hertz, MD;  Location: [facility] OR;  Service: Vascular;  Laterality: Right;   AV FISTULA PLACEMENT Right 01/24/2018   Procedure: ARTERIOVENOUS (AV) FISTULA CREATION RIGHT BRACHIOCEPHALIC;  Surgeon: Fransisco Hertz, MD;  Location: [facility] OR;  Service: Vascular;  Laterality: Right;   BREAST BIOPSY Left 04/07/2023   [place] LT BREAST BX W LOC DEV 1ST LESION IMG BX SPEC US GUIDE 04/07/2023 [facility] MAMMOGRAPHY   CARDIAC CATHETERIZATION  N/A 11/02/2016   Procedure: Left Heart Cath and Coronary Angiography;  Surgeon: Tonny Bollman, MD;  Location: [facility] INVASIVE CV LAB;  Service: Cardiovascular;  Laterality: N/A;   CESAREAN SECTION     COLONOSCOPY     DILATATION & CURETTAGE/HYSTEROSCOPY WITH MYOSURE N/A 09/02/2018   Procedure: DILATATION & CURETTAGE/HYSTEROSCOPY WITH MYOSURE;  Surgeon: Gerald Leitz, MD;  Location: [facility] ORS;  Service: Gynecology;  Laterality: N/A;   FISTULA PLUG     FISTULA SUPERFICIALIZATION Right 05/02/2018   Procedure: FISTULA SUPERFICIALIZATION RIGHT ARTERIOVENOUS FISTULA;  Surgeon: Fransisco Hertz, MD;  Location: [facility] OR;  Service: Vascular;  Laterality: Right;   FRACTURE SURGERY     shoulder left   INSERTION OF DIALYSIS CATHETER Right 01/04/2018   Procedure: INSERTION OF DIALYSIS CATHETER RIGHT INTERNAL JUGULAR;  Surgeon: Fransisco Hertz, MD;  Location: [facility] OR;  Service: Vascular;  Laterality: Right;   IR FLUORO GUIDE CV LINE RIGHT  12/31/2017   IR US GUIDE VASC ACCESS RIGHT  12/31/2017   left shoulder rotator     LIGATION OF ARTERIOVENOUS  FISTULA Right 01/24/2018   Procedure: LIGATION OF  RIGHT RADIOCEPHALIC ARTERIOVENOUS  FISTULA;  Surgeon: Fransisco Hertz, MD;  Location: [facility] OR;  Service: Vascular;  Laterality: Right;   TONSILECTOMY, ADENOIDECTOMY, BILATERAL MYRINGOTOMY AND TUBES     TOOTH EXTRACTION     TUBAL LIGATION     Family History  Problem Relation Age of Onset   Diabetes Mellitus  II Mother    Hypertension Mother    Hyperlipidemia Mother    Kidney disease Mother    Eating disorder Mother    Obesity Mother    Heart disease Father    Heart attack Father 57   Congestive Heart Failure Brother    Congestive Heart Failure Son    Social History:  reports that she quit smoking about 25 years ago. Her smoking use included cigarettes. She has never used smokeless tobacco. She reports that she does not drink alcohol and does not use drugs. Allergies  Allergen Reactions   Bean Pod Extract Nausea And Vomiting    Compazine [Prochlorperazine Edisylate] Swelling and Other (See Comments)    "The tongue hardens and comes out of mouth" (Tardive dyskinesia) Also made the neck muscles twist.   Nsaids Other (See Comments)    WAS TOLD TO NOT TAKE THESE DUE TO KIDNEY ISSUES   Sensipar [Cinacalcet] Nausea And Vomiting    Causes vomiting   Vioxx [Rofecoxib] Other (See Comments)    Caused internal bleeding   Gean Quint [Ferric Citrate] Other (See Comments)    extreme constipation   Other Other (See Comments)    Gean Quint -- Binder caused EXTREME constipation   Prior to Admission medications   Medication Sig Start Date End Date Taking? Authorizing Provider  amLODipine (NORVASC) 5 MG tablet Take 1 tablet (5 mg total) by mouth 2 (two) times daily. 02/01/23  Yes Orbie Pyo, MD  losartan (COZAAR) 50 MG tablet Take 1 tablet (50 mg total) by mouth daily. 02/01/23  Yes Orbie Pyo, MD  MOUNJARO 2.5 MG/0.5ML Pen Inject 2.5 mg into the skin once a week. 11/24/22  Yes [provider]  ondansetron (ZOFRAN-ODT) 4 MG disintegrating tablet Take 4 mg by mouth every 6 (six) hours as needed for nausea or vomiting. 09/07/22  Yes [provider]  rosuvastatin (CRESTOR) 10 MG tablet Take one tablet by mouth 2 days per week--Monday and Wednesday 02/08/23  Yes Orbie Pyo, MD  sevelamer carbonate (RENVELA) 800 MG tablet Take 2,400 mg by mouth 3 (three) times daily with meals.   Yes [provider]  aspirin EC 81 MG tablet Take 1 tablet (81 mg total) by mouth daily. Swallow whole. Patient not taking: Reported on 11/28/2023 10/22/21   Orbie Pyo, MD  hydrALAZINE (APRESOLINE) 25 MG tablet Take 1 tablet (25 mg total) by mouth 2 (two) times daily. 09/02/22 09/02/23  Zannie Cove, MD  calcium carbonate (OS-CAL) 1250 (500 Ca) MG chewable tablet Chew 1 tablet by mouth daily.  08/01/20  [provider]   Current Facility-Administered Medications  Medication Dose Route Frequency Provider Last Rate  Last Admin   acetaminophen (TYLENOL) tablet 650 mg  650 mg Oral Q6H PRN Madelyn Flavors A, MD       Or   acetaminophen (TYLENOL) suppository 650 mg  650 mg Rectal Q6H PRN Smith, Rondell A, MD       albuterol (PROVENTIL) (2.5 MG/3ML) 0.083% nebulizer solution 2.5 mg  2.5 mg Nebulization Q2H PRN Madelyn Flavors A, MD       Chlorhexidine Gluconate Cloth 2 % PADS 6 each  6 each Topical Q0600 Dagoberto Ligas, MD       heparin injection 5,000 Units  5,000 Units Subcutaneous Q8H Smith, Rondell A, MD       sodium chloride flush (NS) 0.9 % injection 3 mL  3 mL Intravenous Q12H Clydie Braun, MD       Current Outpatient  Medications  Medication Sig Dispense Refill   amLODipine (NORVASC) 5 MG tablet Take 1 tablet (5 mg total) by mouth 2 (two) times daily.     losartan (COZAAR) 50 MG tablet Take 1 tablet (50 mg total) by mouth daily. 90 tablet 3   MOUNJARO 2.5 MG/0.5ML Pen Inject 2.5 mg into the skin once a week.     ondansetron (ZOFRAN-ODT) 4 MG disintegrating tablet Take 4 mg by mouth every 6 (six) hours as needed for nausea or vomiting.     rosuvastatin (CRESTOR) 10 MG tablet Take one tablet by mouth 2 days per week--Monday and Wednesday 25 tablet 3   sevelamer carbonate (RENVELA) 800 MG tablet Take 2,400 mg by mouth 3 (three) times daily with meals.     aspirin EC 81 MG tablet Take 1 tablet (81 mg total) by mouth daily. Swallow whole. (Patient not taking: Reported on 11/28/2023) 90 tablet 3   hydrALAZINE (APRESOLINE) 25 MG tablet Take 1 tablet (25 mg total) by mouth 2 (two) times daily. 60 tablet 0   Labs: Basic Metabolic Panel: Recent Labs  Lab 11/28/23 0738  NA 137  K >7.5*  CL 97*  CO2 22  GLUCOSE 126*  BUN 114*  CREATININE 17.32*  CALCIUM 9.7   Liver Function Tests: Recent Labs  Lab 11/28/23 0738  AST 68*  ALT 146*  ALKPHOS 135*  BILITOT 0.6  PROT 7.5  ALBUMIN 3.4*   Recent Labs  Lab 11/28/23 0738  LIPASE 29   No results for input(s): "AMMONIA" in the last 168  hours. CBC: Recent Labs  Lab 11/28/23 0738  WBC 10.5  HGB 8.5*  HCT 27.4*  MCV 97.5  PLT 393   Cardiac Enzymes: No results for input(s): "CKTOTAL", "CKMB", "CKMBINDEX", "TROPONINI" in the last 168 hours. CBG: No results for input(s): "GLUCAP" in the last 168 hours. Iron Studies: No results for input(s): "IRON", "TIBC", "TRANSFERRIN", "FERRITIN" in the last 72 hours. Studies/Results: DG Chest Portable 1 View  Result Date: 11/28/2023 CLINICAL DATA:  missed dialysis dyspnea EXAM: PORTABLE CHEST - 1 VIEW COMPARISON:  11/18/2022. FINDINGS: Cardiac silhouette is prominent. There is pulmonary interstitial prominence with vascular congestion. No focal consolidation. No pneumothorax or pleural effusion identified. IMPRESSION: Findings suggest CHF. Electronically Signed   By: Layla Maw M.D.   On: 11/28/2023 08:51    ROS: Unable to obtain as patient is in acute respiratory distress.  Physical Exam: Vitals:   11/28/23 1315 11/28/23 1350 11/28/23 1419 11/28/23 1420  BP: (!) 160/79 109/66  (!) 161/77  Pulse: 88     Resp: (!) 21   20  Temp:    97.8 F (36.6 C)  TempSrc:    Oral  SpO2: 100%     Weight:   104.7 kg   Height:         General: Awake, alert, obese, in acute respiratory distress Head: Sclera not icteric  Lungs: Breathing is labored. Diminished anteriorly. Heart: RRR. No murmur, rubs or gallops.  Abdomen: soft, large, non-tender Lower extremities: (+) LE edema Neuro: AAOx3. Moves all extremities spontaneously. Dialysis Access: R AVF (+) B/T  Dialysis Orders:  [facility] EDW 102kg (per patient) *will need to obtain HD records if she remains inpatient*  Last Labs: Hgb 8.5, K >7.5, Ca 9.7,  Alb 3.4  Assessment/Plan: Acute hypoxic respiratory failure - 2nd missed HD (12/5 and 12/7). CXR results consistent with CHF. Required Bipap at admit. See below for HD plan. ESRD - HD TTS. K+ >  7.5. Plan for emergent HD today. UFG set 4-5L. Will assess for HD  needs tomorrow morning. Hypertension/volume  - Blood pressures up. She's overloaded. Hopeful will see improvement after HD. Anemia of CKD - Hgb 8.5. Will need to obtain outpatient records on ESA/Fe regimen. Secondary Hyperparathyroidism - Checking phos in AM. Ca okay. Nutrition - Renal diet with fluid restriction once clinically stable  Salome Holmes, NP [employer] 11/28/2023, 2:42 PM

## 2023-11-28 NOTE — ED Notes (Signed)
Chem-8 results potassium of >8.5.  Dr. Dalene Seltzer is aware.

## 2023-11-28 NOTE — ED Notes (Signed)
Pt alert, NAD, calm, interactive. C/o sob and "feeling lousy". HD T, [facility], Saturday. Reports missing 2 HD sessions. R upper arm AVG +bruit/thrill. Skin cool and dry.

## 2023-11-28 NOTE — H&P (Signed)
History and Physical    Patient: Pamela Campbell:[id number] DOB: 08/23/1961 DOA: 11/28/2023 DOS: the patient was seen and examined on 11/28/2023 PCP: Aliene Beams, MD  Patient coming from: Home  Chief Complaint: No chief complaint on file.  HPI: Pamela Campbell is a 62 y.o. female with medical history significant of hypertension, CAD, ESRD on HD, diabetes mellitus type 2, and obesity presents with complaints of shortness of breath. She missed her last two dialysis sessions due to stomach pain. The pain is located in the upper abdomen, described as sharp and intermittent. It has been present for months and has gradually worsened.  The pain is not associated with eating.  Pain does cause her to have nausea and vomiting from time to time. Patient denies taking NSAIDs for pain relief. Denies having the abdominal pain formally evaluated previously, but is scheduled to see gastroenterology in March of this year.  She developed a dry, non-productive cough the other day, but denies recent fevers or sick contacts to her knowledge.  Today when she She was told weak that she was unable to stand and had to call for help.  She had been able to walk previously.  Associated symptoms include orthopnea, dry nonproductive cough, and leg swelling.  Patient has a history of kidney failure, which she attributes to medication-induced kidney damage approximately 6-7 years ago while trying to control elevated blood pressures.  She states her normal blood pressure should be 170.  Patient denies a family history of strokes.  She has a prior history of smoking, but  In the emergency department patient was noted to be afebrile with respirations 15-28, blood pressures elevated up to 213/81, and O2 saturations currently maintained on 2 to 3 L of nasal cannula oxygen.  Labs noted WBC 10.5, hemoglobin 8.5.  I-STAT potassium was noted to be 8.5 chest x-ray was noted to be concerning for CHF.  Patient had been given Lokelma 10 g,  insulin 5 units, hydralazine 10 mg IV, and both D50, and calcium gluconate.  Nephrology have been formally consulted on need of emergent dialysis.  BMP verified patient has a.m. greater than 7.5  Review of Systems: As mentioned in the history of present illness. All other systems reviewed and are negative. Past Medical History:  Diagnosis Date   Anemia    Chronic diastolic CHF (congestive heart failure) (HCC)    a. 03/2015: echo w/ EF of 50-55%, no WMA, Grade 2 DD, trivial AI, mild MR.   Chronic kidney disease (CKD), stage V (HCC)    dialysis tuesday, thursday saterday   Constipation    Diabetes mellitus without complication (HCC)    CONTROLLED WITH DIET   Dyspnea    non lately   Edema, lower extremity    Hypertension    Overweight    Pneumonia 2016   WHILE HOSPIATLIZED   Shortness of breath    Past Surgical History:  Procedure Laterality Date   AV FISTULA PLACEMENT Right 01/04/2018   Procedure: ARTERIOVENOUS (AV) FISTULA CREATION RIGHT ARM;  Surgeon: Fransisco Hertz, MD;  Location: [facility] OR;  Service: Vascular;  Laterality: Right;   AV FISTULA PLACEMENT Right 01/24/2018   Procedure: ARTERIOVENOUS (AV) FISTULA CREATION RIGHT BRACHIOCEPHALIC;  Surgeon: Fransisco Hertz, MD;  Location: [facility] OR;  Service: Vascular;  Laterality: Right;   BREAST BIOPSY Left 04/07/2023   [place] LT BREAST BX W LOC DEV 1ST LESION IMG BX SPEC US GUIDE 04/07/2023 [facility] MAMMOGRAPHY   CARDIAC CATHETERIZATION N/A 11/02/2016  Procedure: Left Heart Cath and Coronary Angiography;  Surgeon: Tonny Bollman, MD;  Location: [facility] INVASIVE CV LAB;  Service: Cardiovascular;  Laterality: N/A;   CESAREAN SECTION     COLONOSCOPY     DILATATION & CURETTAGE/HYSTEROSCOPY WITH MYOSURE N/A 09/02/2018   Procedure: DILATATION & CURETTAGE/HYSTEROSCOPY WITH MYOSURE;  Surgeon: Gerald Leitz, MD;  Location: [facility] ORS;  Service: Gynecology;  Laterality: N/A;   FISTULA PLUG     FISTULA SUPERFICIALIZATION Right 05/02/2018   Procedure: FISTULA SUPERFICIALIZATION  RIGHT ARTERIOVENOUS FISTULA;  Surgeon: Fransisco Hertz, MD;  Location: [facility] OR;  Service: Vascular;  Laterality: Right;   FRACTURE SURGERY     shoulder left   INSERTION OF DIALYSIS CATHETER Right 01/04/2018   Procedure: INSERTION OF DIALYSIS CATHETER RIGHT INTERNAL JUGULAR;  Surgeon: Fransisco Hertz, MD;  Location: [facility] OR;  Service: Vascular;  Laterality: Right;   IR FLUORO GUIDE CV LINE RIGHT  12/31/2017   IR US GUIDE VASC ACCESS RIGHT  12/31/2017   left shoulder rotator     LIGATION OF ARTERIOVENOUS  FISTULA Right 01/24/2018   Procedure: LIGATION OF  RIGHT RADIOCEPHALIC ARTERIOVENOUS  FISTULA;  Surgeon: Fransisco Hertz, MD;  Location: [facility] OR;  Service: Vascular;  Laterality: Right;   TONSILECTOMY, ADENOIDECTOMY, BILATERAL MYRINGOTOMY AND TUBES     TOOTH EXTRACTION     TUBAL LIGATION     Social History:  reports that she quit smoking about 25 years ago. Her smoking use included cigarettes. She has never used smokeless tobacco. She reports that she does not drink alcohol and does not use drugs.  Allergies  Allergen Reactions   Bean Pod Extract Nausea And Vomiting   Compazine [Prochlorperazine Edisylate] Swelling and Other (See Comments)    "The tongue hardens and comes out of mouth" (Tardive dyskinesia) Also made the neck muscles twist.   Nsaids Other (See Comments)    WAS TOLD TO NOT TAKE THESE DUE TO KIDNEY ISSUES   Sensipar [Cinacalcet] Nausea And Vomiting    Causes vomiting   Vioxx [Rofecoxib] Other (See Comments)    Caused internal bleeding   Gean Quint [Ferric Citrate] Other (See Comments)    extreme constipation   Other Other (See Comments)    Gean Quint -- Binder caused EXTREME constipation    Family History  Problem Relation Age of Onset   Diabetes Mellitus II Mother    Hypertension Mother    Hyperlipidemia Mother    Kidney disease Mother    Eating disorder Mother    Obesity Mother    Heart disease Father    Heart attack Father 65   Congestive Heart Failure Brother    Congestive Heart  Failure Son     Prior to Admission medications   Medication Sig Start Date End Date Taking? Authorizing Provider  amLODipine (NORVASC) 5 MG tablet Take 1 tablet (5 mg total) by mouth 2 (two) times daily. 02/01/23   Orbie Pyo, MD  aspirin EC 81 MG tablet Take 1 tablet (81 mg total) by mouth daily. Swallow whole. 10/22/21   Orbie Pyo, MD  Guaifenesin (MUCUS RELIEF ER) 1200 MG TB12 Take 1,200 mg by mouth every 12 (twelve) hours.    [provider]  hydrALAZINE (APRESOLINE) 25 MG tablet Take 1 tablet (25 mg total) by mouth 2 (two) times daily. 09/02/22 09/02/23  Zannie Cove, MD  losartan (COZAAR) 50 MG tablet Take 1 tablet (50 mg total) by mouth daily. 02/01/23   Orbie Pyo, MD  MOUNJARO 2.5 MG/0.5ML Pen Inject 2.5  mg into the skin once a week. 11/24/22   [provider]  ondansetron (ZOFRAN-ODT) 4 MG disintegrating tablet Take 4 mg by mouth every 6 (six) hours as needed. 09/07/22   [provider]  OVER THE COUNTER MEDICATION Take 1 tablet by mouth daily. Renaltab Zn ( multivit)    [provider]  OZEMPIC, 0.25 OR 0.5 MG/DOSE, 2 MG/3ML SOPN SMARTSIG:0.25 Milligram(s) SUB-Q Once a Week 10/10/22   [provider]  rosuvastatin (CRESTOR) 10 MG tablet Take one tablet by mouth 2 days per week--Monday and Wednesday 02/08/23   Orbie Pyo, MD  sevelamer carbonate (RENVELA) 800 MG tablet Take 2,400 mg by mouth 3 (three) times daily with meals.    [provider]  calcium carbonate (OS-CAL) 1250 (500 Ca) MG chewable tablet Chew 1 tablet by mouth daily.  08/01/20  [provider]    Physical Exam: Vitals:   11/28/23 0835 11/28/23 0845 11/28/23 0900 11/28/23 0910  BP:  (!) 213/81 (!) 203/86   Pulse: 69   70  Resp: (!) 24 (!) 25 (!) 28 (!) 21  Temp:      TempSrc:      SpO2: 100%   100%  Weight:      Height:       Constitutional: Obese female who appears to be some respiratory discomfort Eyes: PERRL, lids and  conjunctivae normal ENMT: Mucous membranes are moist.  .Normal dentition.  Neck: normal, supple, JVD present. Respiratory: Patient currently on BiPAP with respirations unlabored.  Able to speak in short sentences. Cardiovascular: Regular rate and rhythm, no murmurs / rubs / gallops.  Trace lower extremity edema.   Abdomen: no tenderness, no masses palpated.  Bowel sounds positive.  Musculoskeletal: no clubbing / cyanosis. No joint deformity upper and lower extremities. Good ROM, no contractures. Normal muscle tone.  Skin: no rashes, lesions, ulcers. No induration Neurologic: CN 2-12 grossly intact.  Patient is able to move all extremities. Psychiatric: Normal judgment and insight. Alert and oriented x 3. Normal mood.   Data Reviewed:  EKG revealed sinus rhythm with frequent PVCs at 138 bpm with left axis deviation and left bundle block and T wave peaking..  Reviewed labs, imaging, and pertinent records as documented.  Assessment and Plan:  Acute respiratory failure with hypoxia secondary to fluid overload ESRD on HD Hyperkalemia Heart failure with preserved EF Patient presents with shortness of breath after missing her last 2 hemodialysis sessions due to abdominal pain.  She developed respiratory distress and was placed on BiPAP.  Initial i-STAT labs significant for potassium of 8.5.  BMP confirms potassiums greater than 7.5, CO2 22, BUN 114, creatinine 17.32.  EKG noted T wave peaking.  Chest x-ray concerning for fluid overload.  Patient had been given Lokelma, insulin, dextrose, calcium gluconate, and breathing treatment.  Nephrology was consulted and orders placed for emergent dialysis in the ED. -Admit to a progressive bed -Continuous pulse oximetry with nasal cannula oxygen maintain O2 saturations greater than 92%. -N.p.o.   -Wean off BiPAP once able and advance diet as tolerated -Resume home medication regimen once able -Nephrology consulted for dialysis  Hypertensive  urgency Blood pressures initially elevated up to 213/81.  Patient had been given hydralazine 10 mg IV x 1 dose. -Resume home blood pressure regimen once able -Hydralazine IV as needed  Abdominal pain Acute on chronic.  Patient reports having upper abdominal pain that she describes as sharp and intermittent relation to eating that has been present for quite some  time.  Lipase was within normal limits.  Scheduled to follow-up with gastroenterology in March this coming year. -Check CT scan of the abdomen pelvis once able without contrast  Metabolic acidosis with elevated anion gap Thought secondary to patient's end-stage renal disease with concern for uremia.  -Continue to monitor  Anemia of chronic disease Acute.  Hemoglobin 8.5.  Baseline hemoglobin previously had been noted to be 10-11 back in 2023.  Patient did not report any acute signs of bleeding in stools. -Recheck CBC in a.m.  Elevated liver function studies Acute.  Phosphatase 135, AST 68, and ALT 146.  Thought secondary to patient being acutely fluid overloaded. -Follow-up hepatitis studies  Hyperlipidemia -Continue atorvastatin  Morbid obesity BMI 41.41 kg/m. Patient on Mounjaro in the outpatient setting. -Continue Mounjaro in outpatient setting   DVT prophylaxis: Heparin Advance Care Planning:   Code Status: Full Code    Consults: Nephrology  Family Communication: No family requested to be updated at this time  Severity of Illness: The appropriate patient status for this patient is INPATIENT. Inpatient status is judged to be reasonable and necessary in order to provide the required intensity of service to ensure the patient's safety. The patient's presenting symptoms, physical exam findings, and initial radiographic and laboratory data in the context of their chronic comorbidities is felt to place them at high risk for further clinical deterioration. Furthermore, it is not anticipated that the patient will be  medically stable for discharge from the hospital within 2 midnights of admission.   * I certify that at the point of admission it is my clinical judgment that the patient will require inpatient hospital care spanning beyond 2 midnights from the point of admission due to high intensity of service, high risk for further deterioration and high frequency of surveillance required.*  Author: Clydie Braun, MD 11/28/2023 9:14 AM  For on call review [email or web address].

## 2023-11-28 NOTE — ED Notes (Signed)
Lokelma finished

## 2023-11-28 NOTE — ED Provider Notes (Signed)
[place] EMERGENCY DEPARTMENT AT [facility] Provider Note   CSN: [id number] Arrival date & time: 11/28/23  1324     History  No chief complaint on file.   Pamela Campbell is a 62 y.o. female.  HPI    63 year old female comes in with chief complaint of weakness. Patient has history of CAD, hypertension, ESRD on hemodialysis.  She states that she missed the last 2 sessions of dialysis because of abdominal discomfort.  The abdominal discomfort is not new, it has been bothering her for a long time and she is supposed to see GI doctor soon.  Patient was unable to stand up today because of weakness in her legs, and called for help.  She states that she was walking yesterday.  Review of system is also positive for shortness of breath and orthopnea and leg swelling.  Patient noted to have blood pressure over 200 systolic.  She said it is not uncommon for her to have high blood pressure.  Home Medications Prior to Admission medications   Medication Sig Start Date End Date Taking? Authorizing Provider  amLODipine (NORVASC) 5 MG tablet Take 1 tablet (5 mg total) by mouth 2 (two) times daily. 02/01/23   Orbie Pyo, MD  aspirin EC 81 MG tablet Take 1 tablet (81 mg total) by mouth daily. Swallow whole. 10/22/21   Orbie Pyo, MD  Guaifenesin (MUCUS RELIEF ER) 1200 MG TB12 Take 1,200 mg by mouth every 12 (twelve) hours.    [provider]  hydrALAZINE (APRESOLINE) 25 MG tablet Take 1 tablet (25 mg total) by mouth 2 (two) times daily. 09/02/22 09/02/23  Zannie Cove, MD  losartan (COZAAR) 50 MG tablet Take 1 tablet (50 mg total) by mouth daily. 02/01/23   Orbie Pyo, MD  MOUNJARO 2.5 MG/0.5ML Pen Inject 2.5 mg into the skin once a week. 11/24/22   [provider]  ondansetron (ZOFRAN-ODT) 4 MG disintegrating tablet Take 4 mg by mouth every 6 (six) hours as needed. 09/07/22   [provider]  OVER THE COUNTER MEDICATION Take 1 tablet by mouth daily.  Renaltab Zn ( multivit)    [provider]  OZEMPIC, 0.25 OR 0.5 MG/DOSE, 2 MG/3ML SOPN SMARTSIG:0.25 Milligram(s) SUB-Q Once a Week 10/10/22   [provider]  rosuvastatin (CRESTOR) 10 MG tablet Take one tablet by mouth 2 days per week--Monday and Wednesday 02/08/23   Orbie Pyo, MD  sevelamer carbonate (RENVELA) 800 MG tablet Take 2,400 mg by mouth 3 (three) times daily with meals.    [provider]  calcium carbonate (OS-CAL) 1250 (500 Ca) MG chewable tablet Chew 1 tablet by mouth daily.  08/01/20  [provider]      Allergies    Bean pod extract, Compazine [prochlorperazine edisylate], Nsaids, Sensipar [cinacalcet], Vioxx [rofecoxib], Auryxia [ferric citrate], and Other    Review of Systems   Review of Systems  All other systems reviewed and are negative.   Physical Exam Updated Vital Signs BP (!) 203/86   Pulse 70   Temp 97.9 F (36.6 C) (Oral)   Resp (!) 21   Ht 5\' 4"  (1.626 m)   Wt 104.3 kg   LMP 02/02/2015   SpO2 100%   BMI 39.48 kg/m  Physical Exam Vitals and nursing note reviewed.  Constitutional:      Appearance: She is well-developed.  HENT:     Head: Atraumatic.  Cardiovascular:     Rate and Rhythm: Normal rate.  Pulmonary:     Effort: Pulmonary effort is normal.     Breath sounds: No wheezing or rales.     Comments: Tachypnea noted Musculoskeletal:     Cervical back: Normal range of motion and neck supple.     Comments: Trace lower extremity edema noted  Skin:    General: Skin is warm and dry.  Neurological:     Mental Status: She is alert and oriented to person, place, and time.     ED Results / Procedures / Treatments   Labs (all labs ordered are listed, but only abnormal results are displayed) Labs Reviewed  CBC - Abnormal; Notable for the following components:      Result Value   RBC 2.81 (*)    Hemoglobin 8.5 (*)    HCT 27.4 (*)    RDW 17.2 (*)    All other components within normal limits   BASIC METABOLIC PANEL  I-STAT CHEM 8, ED      EKG EKG Interpretation Date/Time:  Sunday November 28 2023 07:22:43 EST Ventricular Rate:  138 PR Interval:  180 QRS Duration:  130 QT Interval:  390 QTC Calculation: 590 R Axis:   -72  Text Interpretation: Sinus rhythm with frequent Premature ventricular complexes Left axis deviation Left bundle branch block Abnormal ECG When compared with ECG of 22-Sep-2022 12:01, PREVIOUS ECG IS PRESENT peaked T waves and widened QRS noted - new Confirmed by Derwood Kaplan [phone]) on 11/28/2023 8:39:24 AM  Radiology DG Chest Portable 1 View  Result Date: 11/28/2023 CLINICAL DATA:  missed dialysis dyspnea EXAM: PORTABLE CHEST - 1 VIEW COMPARISON:  11/18/2022. FINDINGS: Cardiac silhouette is prominent. There is pulmonary interstitial prominence with vascular congestion. No focal consolidation. No pneumothorax or pleural effusion identified. IMPRESSION: Findings suggest CHF. Electronically Signed   By: Layla Maw M.D.   On: 11/28/2023 08:51    Procedures Procedures    CRITICAL CARE Performed by: Tonyetta Berko   Total critical care time: 41 minutes for hyperkalemia with EKG changes requiring emergent dialysis.  Critical care time was exclusive of separately billable procedures and treating other patients.  Critical care was necessary to treat or prevent imminent or life-threatening deterioration.  Critical care was time spent personally by me on the following activities: development of treatment plan with patient and/or surrogate as well as nursing, discussions with consultants, evaluation of patient's response to treatment, examination of patient, obtaining history from patient or surrogate, ordering and performing treatments and interventions, ordering and review of laboratory studies, ordering and review of radiographic studies, pulse oximetry and re-evaluation of patient's condition.   Medications Ordered in ED Medications  calcium  gluconate 1 g/ 50 mL sodium chloride IVPB (0 mg Intravenous Stopped 11/28/23 0900)  insulin aspart (novoLOG) injection 5 Units (5 Units Intravenous Given 11/28/23 0759)  dextrose 50 % solution 50 mL (50 mLs Intravenous Given 11/28/23 0801)  sodium zirconium cyclosilicate (LOKELMA) packet 10 g (10 g Oral Given 11/28/23 0807)  hydrALAZINE (APRESOLINE) injection 10 mg (10 mg Intravenous Given 11/28/23 0907)    ED Course/ Medical Decision Making/ A&P                                 Medical Decision Making Amount and/or Complexity of Data Reviewed Labs: ordered.  Risk Prescription drug management. Decision regarding hospitalization.   This patient presents to the ED with chief complaint(s) of missed dialysis, generalized weakness with pertinent past medical history  of end-stage renal disease, hypertension, hyperlipidemia and diabetes.The complaint involves an extensive differential diagnosis and also carries with it a high risk of complications and morbidity.    The differential diagnosis includes : Severe electrolyte abnormality, hypertensive emergency, profound uremia. Abdominal pain appears to be chronic in nature and patient does not have any peritonitis.  Abdominal pain in the upper quadrants only.  The initial EKG reveals new conduction changes and slightly peaked T waves.  Concerns for hyperkalemia.  I-STAT Chem-8 confirms potassium over 8.   Additional history obtained: Additional history obtained from family Records reviewed Care Everywhere/External Records.  It appears that patient gets her care at [facility].  She has had upper and lower endoscopy, has diverticulosis.  She is being worked up for anemia right now.  Independent labs interpretation:  The following labs were independently interpreted: Patient's hemoglobin is 8.5, down from over 10 last year.  But it appears that her hemoglobin has been steady in that range more recently.  On i-STAT Chem-8, her potassium was greater  than 8.5.  Independent visualization and interpretation of imaging: - I independently visualized the following imaging with scope of interpretation limited to determining acute life threatening conditions related to emergency care: X-ray of the chest, which revealed evidence of pulmonary edema.  Patient did not have profound rales on exam.  Treatment and Reassessment: Patient reassessed.  Results discussed with her.  I repeated abdominal exam.  She does not have any peritonitis.  I do not think CT scan is indicated at this time.  She does need emergent dialysis, and possible multiple dialysis.  Consultation: - Consulted or discussed management/test interpretation with external professional: Dr. Allena Katz, nephrology.  They will see the patient and dialyze her emergently.   Final Clinical Impression(s) / ED Diagnoses Final diagnoses:  Acute hyperkalemia  Abnormal EKG  Hypertensive urgency    Rx / DC Orders ED Discharge Orders     None         Derwood Kaplan, MD 11/28/23 [phone]

## 2023-11-28 NOTE — ED Notes (Signed)
Moved with RT and RN on monitor and Bipap to room 46 yellow for HD. HD at [facility].

## 2023-11-28 NOTE — ED Notes (Signed)
Continues drinking Lokelma

## 2023-11-29 DIAGNOSIS — N186 End stage renal disease: Secondary | ICD-10-CM

## 2023-11-29 DIAGNOSIS — E875 Hyperkalemia: Secondary | ICD-10-CM | POA: Diagnosis not present

## 2023-11-29 DIAGNOSIS — I160 Hypertensive urgency: Secondary | ICD-10-CM | POA: Diagnosis not present

## 2023-11-29 DIAGNOSIS — Z992 Dependence on renal dialysis: Secondary | ICD-10-CM

## 2023-11-29 DIAGNOSIS — R748 Abnormal levels of other serum enzymes: Secondary | ICD-10-CM | POA: Diagnosis not present

## 2023-11-29 DIAGNOSIS — E8770 Fluid overload, unspecified: Secondary | ICD-10-CM | POA: Diagnosis not present

## 2023-11-29 LAB — IRON AND TIBC
Iron: 56 ug/dL (ref 28–170)
Saturation Ratios: 20 % (ref 10.4–31.8)
TIBC: 283 ug/dL (ref 250–450)
UIBC: 227 ug/dL

## 2023-11-29 LAB — CBC
HCT: 27.9 % — ABNORMAL LOW (ref 36.0–46.0)
Hemoglobin: 8.6 g/dL — ABNORMAL LOW (ref 12.0–15.0)
MCH: 30.4 pg (ref 26.0–34.0)
MCHC: 30.8 g/dL (ref 30.0–36.0)
MCV: 98.6 fL (ref 80.0–100.0)
Platelets: 374 10*3/uL (ref 150–400)
RBC: 2.83 MIL/uL — ABNORMAL LOW (ref 3.87–5.11)
RDW: 17.1 % — ABNORMAL HIGH (ref 11.5–15.5)
WBC: 8.5 10*3/uL (ref 4.0–10.5)
nRBC: 0 % (ref 0.0–0.2)

## 2023-11-29 LAB — RENAL FUNCTION PANEL
Albumin: 2.9 g/dL — ABNORMAL LOW (ref 3.5–5.0)
Anion gap: 14 (ref 5–15)
BUN: 60 mg/dL — ABNORMAL HIGH (ref 8–23)
CO2: 28 mmol/L (ref 22–32)
Calcium: 8.7 mg/dL — ABNORMAL LOW (ref 8.9–10.3)
Chloride: 95 mmol/L — ABNORMAL LOW (ref 98–111)
Creatinine, Ser: 11.13 mg/dL — ABNORMAL HIGH (ref 0.44–1.00)
GFR, Estimated: 4 mL/min — ABNORMAL LOW (ref >60)
Glucose, Bld: 129 mg/dL — ABNORMAL HIGH (ref 70–99)
Phosphorus: 9 mg/dL — ABNORMAL HIGH (ref 2.5–4.6)
Potassium: 6 mmol/L — ABNORMAL HIGH (ref 3.5–5.1)
Sodium: 137 mmol/L (ref 135–145)

## 2023-11-29 LAB — HEPATITIS B SURFACE ANTIGEN: Hepatitis B Surface Ag: NONREACTIVE

## 2023-11-29 LAB — FERRITIN: Ferritin: 3399 ng/mL — ABNORMAL HIGH (ref 11–307)

## 2023-11-29 LAB — FOLATE: Folate: 16.6 ng/mL (ref >5.9)

## 2023-11-29 LAB — VITAMIN B12: Vitamin B-12: 855 pg/mL (ref 180–914)

## 2023-11-29 MED ORDER — SEVELAMER CARBONATE 800 MG PO TABS
2400.0000 mg | ORAL_TABLET | Freq: Three times a day (TID) | ORAL | Status: DC
  Administered 2023-11-29 – 2023-12-01 (×6): 2400 mg via ORAL
  Filled 2023-11-29 (×6): qty 3

## 2023-11-29 MED ORDER — LOSARTAN POTASSIUM 50 MG PO TABS
50.0000 mg | ORAL_TABLET | Freq: Every day | ORAL | Status: DC
  Filled 2023-11-29 (×2): qty 1

## 2023-11-29 MED ORDER — AMLODIPINE BESYLATE 5 MG PO TABS
5.0000 mg | ORAL_TABLET | Freq: Two times a day (BID) | ORAL | Status: DC
  Filled 2023-11-29 (×3): qty 1

## 2023-11-29 MED ORDER — SODIUM ZIRCONIUM CYCLOSILICATE 10 G PO PACK
10.0000 g | PACK | Freq: Three times a day (TID) | ORAL | Status: DC
  Administered 2023-11-29 – 2023-11-30 (×5): 10 g via ORAL
  Filled 2023-11-29 (×9): qty 1

## 2023-11-29 MED ORDER — HYDRALAZINE HCL 25 MG PO TABS
25.0000 mg | ORAL_TABLET | Freq: Three times a day (TID) | ORAL | Status: DC
  Filled 2023-11-29 (×2): qty 1

## 2023-11-29 MED ORDER — ROSUVASTATIN CALCIUM 5 MG PO TABS
10.0000 mg | ORAL_TABLET | ORAL | Status: DC
Start: 2023-11-29 — End: 2023-11-29

## 2023-11-29 MED ORDER — CHLORHEXIDINE GLUCONATE CLOTH 2 % EX PADS: 6.0000 | MEDICATED_PAD | Freq: Every day | CUTANEOUS | Status: DC

## 2023-11-29 MED ORDER — ROSUVASTATIN CALCIUM 5 MG PO TABS
10.0000 mg | ORAL_TABLET | ORAL | Status: DC
  Administered 2023-11-29: 10 mg via ORAL
  Filled 2023-11-29: qty 2

## 2023-11-29 MED ORDER — PANTOPRAZOLE SODIUM 40 MG IV SOLR
40.0000 mg | Freq: Two times a day (BID) | INTRAVENOUS | Status: DC
  Administered 2023-11-29 – 2023-12-01 (×5): 40 mg via INTRAVENOUS
  Filled 2023-11-29 (×5): qty 10

## 2023-11-29 NOTE — Progress Notes (Signed)
   11/29/23 2030  Therapy Vitals  Patient Position (if appropriate) Sitting  Respiratory Assessment  Assessment Type Assess only  Respiratory Pattern Regular;Unlabored  Chest Assessment Chest expansion symmetrical  Cough None  Bilateral Breath Sounds Clear  Oxygen Therapy/Pulse Ox  O2 Device Room Air   Pt sitting in chair with no signs of distress. Pt laughing and making jokes with RT. BiPAP on stand by if needed.

## 2023-11-29 NOTE — H&P (View-Only) (Signed)
Referring Provider: Dr. Janee Morn Primary Care Physician:  Aliene Beams, MD Primary Gastroenterologist:  Dr. Marca Ancona  Reason for Consultation:  Epigastric pain  HPI: Pamela Campbell is a 62 y.o. female with multiple medical problems as stated below seen for a consultation due to epigastric pain that worsened in the past week and has been having intermittent nonbloody vomiting. Abdominal pain has been intermittent for months and is not associated with eating or any known triggers. She missed dialysis twice last week and reports receiving dialysis last night in hospital and feels better. EGD in 2020 showed chronic inactive gastritis and peptic duodenitis. History of adenomatous polyps and surveillance colonoscopy in Feb 2024 where a tubulovillous adenoma and a tubular adenoma were removed. Denies recent difficulty moving her bowels. Denies melena or hematochezia. Patient says she is not satisfied with the explanations Dr. Marca Ancona has given her in the past regarding her abdominal pain and wants answers as to why she keeps having abdominal pain.  Past Medical History:  Diagnosis Date   Anemia    Chronic diastolic CHF (congestive heart failure) (HCC)    a. 03/2015: echo w/ EF of 50-55%, no WMA, Grade 2 DD, trivial AI, mild MR.   Chronic kidney disease (CKD), stage V (HCC)    dialysis tuesday, thursday saterday   Constipation    Diabetes mellitus without complication (HCC)    CONTROLLED WITH DIET   Dyspnea    non lately   Edema, lower extremity    Hypertension    Overweight    Pneumonia 2016   WHILE HOSPIATLIZED   Shortness of breath     Past Surgical History:  Procedure Laterality Date   AV FISTULA PLACEMENT Right 01/04/2018   Procedure: ARTERIOVENOUS (AV) FISTULA CREATION RIGHT ARM;  Surgeon: Fransisco Hertz, MD;  Location: [facility] OR;  Service: Vascular;  Laterality: Right;   AV FISTULA PLACEMENT Right 01/24/2018   Procedure: ARTERIOVENOUS (AV) FISTULA CREATION RIGHT BRACHIOCEPHALIC;  Surgeon: Fransisco Hertz, MD;  Location: [facility] OR;  Service: Vascular;  Laterality: Right;   BREAST BIOPSY Left 04/07/2023   [place] LT BREAST BX W LOC DEV 1ST LESION IMG BX SPEC US GUIDE 04/07/2023 [facility] MAMMOGRAPHY   CARDIAC CATHETERIZATION N/A 11/02/2016   Procedure: Left Heart Cath and Coronary Angiography;  Surgeon: Tonny Bollman, MD;  Location: [facility] INVASIVE CV LAB;  Service: Cardiovascular;  Laterality: N/A;   CESAREAN SECTION     COLONOSCOPY     DILATATION & CURETTAGE/HYSTEROSCOPY WITH MYOSURE N/A 09/02/2018   Procedure: DILATATION & CURETTAGE/HYSTEROSCOPY WITH MYOSURE;  Surgeon: Gerald Leitz, MD;  Location: [facility] ORS;  Service: Gynecology;  Laterality: N/A;   FISTULA PLUG     FISTULA SUPERFICIALIZATION Right 05/02/2018   Procedure: FISTULA SUPERFICIALIZATION RIGHT ARTERIOVENOUS FISTULA;  Surgeon: Fransisco Hertz, MD;  Location: [facility] OR;  Service: Vascular;  Laterality: Right;   FRACTURE SURGERY     shoulder left   INSERTION OF DIALYSIS CATHETER Right 01/04/2018   Procedure: INSERTION OF DIALYSIS CATHETER RIGHT INTERNAL JUGULAR;  Surgeon: Fransisco Hertz, MD;  Location: [facility] OR;  Service: Vascular;  Laterality: Right;   IR FLUORO GUIDE CV LINE RIGHT  12/31/2017   IR US GUIDE VASC ACCESS RIGHT  12/31/2017   left shoulder rotator     LIGATION OF ARTERIOVENOUS  FISTULA Right 01/24/2018   Procedure: LIGATION OF  RIGHT RADIOCEPHALIC ARTERIOVENOUS  FISTULA;  Surgeon: Fransisco Hertz, MD;  Location: [facility] OR;  Service: Vascular;  Laterality: Right;   TONSILECTOMY, ADENOIDECTOMY, BILATERAL MYRINGOTOMY AND  TUBES     TOOTH EXTRACTION     TUBAL LIGATION      Prior to Admission medications   Medication Sig Start Date End Date Taking? Authorizing Provider  amLODipine (NORVASC) 5 MG tablet Take 1 tablet (5 mg total) by mouth 2 (two) times daily. 02/01/23  Yes Orbie Pyo, MD  losartan (COZAAR) 50 MG tablet Take 1 tablet (50 mg total) by mouth daily. 02/01/23  Yes Orbie Pyo, MD  MOUNJARO 2.5 MG/0.5ML Pen Inject 2.5 mg into the skin  once a week. 11/24/22  Yes [provider]  ondansetron (ZOFRAN-ODT) 4 MG disintegrating tablet Take 4 mg by mouth every 6 (six) hours as needed for nausea or vomiting. 09/07/22  Yes [provider]  rosuvastatin (CRESTOR) 10 MG tablet Take one tablet by mouth 2 days per week--Monday and Wednesday 02/08/23  Yes Orbie Pyo, MD  sevelamer carbonate (RENVELA) 800 MG tablet Take 2,400 mg by mouth 3 (three) times daily with meals.   Yes [provider]  aspirin EC 81 MG tablet Take 1 tablet (81 mg total) by mouth daily. Swallow whole. Patient not taking: Reported on 11/28/2023 10/22/21   Orbie Pyo, MD  hydrALAZINE (APRESOLINE) 25 MG tablet Take 1 tablet (25 mg total) by mouth 2 (two) times daily. 09/02/22 09/02/23  Zannie Cove, MD  calcium carbonate (OS-CAL) 1250 (500 Ca) MG chewable tablet Chew 1 tablet by mouth daily.  08/01/20  [provider]    Scheduled Meds:  amLODipine  5 mg Oral BID   Chlorhexidine Gluconate Cloth  6 each Topical Q0600   heparin  5,000 Units Subcutaneous Q8H   hydrALAZINE  25 mg Oral Q8H   losartan  50 mg Oral Daily   pantoprazole (PROTONIX) IV  40 mg Intravenous Q12H   rosuvastatin  10 mg Oral Once per day on Monday Wednesday   sevelamer carbonate  2,400 mg Oral TID WC   sodium chloride flush  3 mL Intravenous Q12H   sodium zirconium cyclosilicate  10 g Oral TID   Continuous Infusions: PRN Meds:.acetaminophen **OR** acetaminophen, albuterol  Allergies as of 11/28/2023 - Review Complete 11/28/2023  Allergen Reaction Noted   Bean pod extract Nausea And Vomiting 09/22/2022   Compazine [prochlorperazine edisylate] Swelling and Other (See Comments) 01/17/2013   Nsaids Other (See Comments) 12/10/2020   Sensipar [cinacalcet] Nausea And Vomiting 12/10/2020   Vioxx [rofecoxib] Other (See Comments) 01/17/2013   Gean Quint [ferric citrate] Other (See Comments) 09/22/2022   Other Other (See Comments) 12/10/2020    Family History   Problem Relation Age of Onset   Diabetes Mellitus II Mother    Hypertension Mother    Hyperlipidemia Mother    Kidney disease Mother    Eating disorder Mother    Obesity Mother    Heart disease Father    Heart attack Father 49   Congestive Heart Failure Brother    Congestive Heart Failure Son     Social History   Socioeconomic History   Marital status: Divorced    Spouse name: Not on file   Number of children: Not on file   Years of education: Not on file   Highest education level: Not on file  Occupational History   Occupation: [employer]  Tobacco Use   Smoking status: Former    Current packs/day: 0.00    Types: Cigarettes    Quit date: 03/21/1998    Years since quitting: 25.7   Smokeless tobacco: Never  Vaping Use  Vaping status: Never Used  Substance and Sexual Activity   Alcohol use: No   Drug use: No   Sexual activity: Not Currently    Birth control/protection: None  Other Topics Concern   Not on file  Social History Narrative   Not on file   Social Determinants of Health   Financial Resource Strain: Not on file  Food Insecurity: No Food Insecurity (11/29/2023)   Hunger Vital Sign    Worried About Running Out of Food in the Last Year: Never true    Ran Out of Food in the Last Year: Never true  Transportation Needs: No Transportation Needs (11/29/2023)   PRAPARE - Administrator, Civil Service (Medical): No    Lack of Transportation (Non-Medical): No  Physical Activity: Not on file  Stress: Not on file  Social Connections: Not on file  Intimate Partner Violence: Not At Risk (11/29/2023)   Humiliation, Afraid, Rape, and Kick questionnaire    Fear of Current or Ex-Partner: No    Emotionally Abused: No    Physically Abused: No    Sexually Abused: No    Review of Systems: All negative except as stated above in HPI.  Physical Exam: Vital signs: Vitals:   11/29/23 1100 11/29/23 1243  BP: (!) 171/77   Pulse: 80   Resp:    Temp:  99.3 F  (37.4 C)  SpO2: 100%    Last BM Date : 11/27/23 General:  Lethargic, obese, pleasant and cooperative in NAD Head: normocephalic, atraumatic Eyes: anicteric sclera ENT: oropharynx clear Neck: supple, nontender Lungs:  Clear throughout to auscultation.   No wheezes, crackles, or rhonchi. No acute distress. Heart:  Regular rate and rhythm; no murmurs, clicks, rubs,  or gallops. Abdomen: epigastric tenderness with guarding, soft, nondistended, +BS, obese  Rectal:  Deferred Ext: no edema  GI:  Lab Results: Recent Labs    11/28/23 0738 11/29/23 0450  WBC 10.5 8.5  HGB 8.5* 8.6*  HCT 27.4* 27.9*  PLT 393 374   BMET Recent Labs    11/28/23 0738 11/29/23 0450  NA 137 137  K >7.5* 6.0*  CL 97* 95*  CO2 22 28  GLUCOSE 126* 129*  BUN 114* 60*  CREATININE 17.32* 11.13*  CALCIUM 9.7 8.7*   LFT Recent Labs    11/28/23 0738 11/29/23 0450  PROT 7.5  --   ALBUMIN 3.4* 2.9*  AST 68*  --   ALT 146*  --   ALKPHOS 135*  --   BILITOT 0.6  --   BILIDIR <0.1  --   IBILI NOT CALCULATED  --    PT/INR No results for input(s): "LABPROT", "INR" in the last 72 hours.   Studies/Results: CT ABDOMEN PELVIS WO CONTRAST  Result Date: 11/28/2023 CLINICAL DATA:  Abdominal pain EXAM: CT ABDOMEN AND PELVIS WITHOUT CONTRAST TECHNIQUE: Multidetector CT imaging of the abdomen and pelvis was performed following the standard protocol without IV contrast. RADIATION DOSE REDUCTION: This exam was performed according to the departmental dose-optimization program which includes automated exposure control, adjustment of the mA and/or kV according to patient size and/or use of iterative reconstruction technique. COMPARISON:  None Available. FINDINGS: Lower chest: Mild bilateral lower lobe atelectasis with mosaic attenuation. Cardiomegaly. Hepatobiliary: Unenhanced liver is unremarkable. Gallbladder is unremarkable. No intrahepatic or extrahepatic duct dilatation. Pancreas: Within normal limits. Spleen:  Within normal limits. Adrenals/Urinary Tract: Adrenal glands are within normal limits. Bilateral renal parenchymal atrophy. Suspected small bilateral renal cysts, measuring up to 16 mm in  the anterior left lower kidney (series 3/image 38), poorly evaluated. 3 mm nonobstructing bilateral lower pole renal calculi (series 3/image 37). No ureteral or bladder calculi. No hydronephrosis. Bladder is underdistended but unremarkable. Stomach/Bowel: Stomach is within normal limits. No evidence of bowel obstruction. Normal appendix (series 3/image 57). No colonic wall thickening or inflammatory changes. Vascular/Lymphatic: No evidence of abdominal aortic aneurysm. Atherosclerotic calcifications of the abdominal aorta and branch vessels. No suspicious abdominopelvic lymphadenopathy. Reproductive: Uterus is mildly heterogeneous, suggesting uterine fibroids. Bilateral ovaries are within normal limits. Other: No abdominopelvic ascites. Musculoskeletal: Mild degenerative changes of the lower thoracic spine. IMPRESSION: 3 mm nonobstructing bilateral lower pole renal calculi. No ureteral or bladder calculi. No hydronephrosis. Suspected bilateral renal cysts, poorly visualized/evaluated on unenhanced CT. Consider follow-up CT or MRI abdomen with/without contrast in 6 months, as clinically warranted. Additional ancillary findings as above. Electronically Signed   By: Charline Bills M.D.   On: 11/28/2023 18:23   DG Chest Portable 1 View  Result Date: 11/28/2023 CLINICAL DATA:  missed dialysis dyspnea EXAM: PORTABLE CHEST - 1 VIEW COMPARISON:  11/18/2022. FINDINGS: Cardiac silhouette is prominent. There is pulmonary interstitial prominence with vascular congestion. No focal consolidation. No pneumothorax or pleural effusion identified. IMPRESSION: Findings suggest CHF. Electronically Signed   By: Layla Maw M.D.   On: 11/28/2023 08:51    Impression/Plan:  Acute on chronic epigastric pain - peptic ulcer disease possible  and needs an EGD to further evaluate. At discharge she will need to discuss with her primary care physician for referral to another GI group since she has expressed dissatisfaction with Dr. Marca Ancona. NPO p MN. EGD scheduled for tomorrow morning. Supportive care.    LOS: 1 day   Shirley Friar  11/29/2023, 12:49 PM  Questions please call [phone]

## 2023-11-29 NOTE — Progress Notes (Signed)
BiPAP order in place. RT at bedside to assess pt this AM. RT found pt on [place] and in no distress at this time. BiPAP in room on stby.

## 2023-11-29 NOTE — Progress Notes (Signed)
PROGRESS NOTE    Pamela Campbell  ZOX:[id number] DOB: 1961-01-09 DOA: 11/28/2023 PCP: Aliene Beams, MD   No chief complaint on file.   Brief Narrative:  Patient is a 62 year old female history of hypertension, CAD, ESRD on HD, type 2 diabetes, obesity presented with shortness of breath.  Patient noted to have missed 2 hemodialysis sessions due to upper abdominal pain.  Patient seen in the ED noted to be significantly volume overloaded, with worsening respiratory status had to be placed on BiPAP.  Chest x-ray concerning for CHF.  Patient also noted to be hyperkalemic, seen in consultation by nephrology and underwent emergent hemodialysis in the ED.  GI consulted for further evaluation of upper abdominal pain.   Assessment & Plan:   Principal Problem:   Fluid overload Active Problems:   (HFpEF) heart failure with preserved ejection fraction (HCC)   ESRD on dialysis (HCC)   Hyperkalemia   Hypertensive urgency   Abdominal pain   Metabolic acidosis   Anemia of chronic disease   Abnormal liver enzymes   Dyslipidemia   Morbid obesity (HCC)  #1 acute respiratory failure with hypoxia secondary to volume overload/acute diastolic CHF exacerbation -Patient presented with shortness of breath, after missing last 2 HD sessions due to abdominal pain. -Patient on presentation to the ED to go into worsening respiratory distress and subsequently placed on BiPAP. -Initial i-STAT labs significant for potassium of 8.5. -Basic metabolic profile done with a potassium > 7.5, CO2 of 22, BUN of 114, creatinine of 17.32, EKG with some T wave peaking. -Chest x-ray done concerning for fluid overload/CHF. -Patient seen in consultation by nephrology underwent emergent hemodialysis in the ED with clinical improvement. -Patient subsequently weaned off BiPAP currently on 1-1/2 L nasal cannula with sats greater than 92%. -Check a 2D echo. -Per nephrology.  2.  ESRD on HD -Patient noted to have missed 2  hemodialysis sessions prior to admission due to complaints of upper abdominal pain. -Patient noted to be hyperkalemic on admission, and acute CHF exacerbation, patient seen in consultation by nephrology and underwent emergent hemodialysis in the ED.  3.  Hyperkalemia -Likely secondary to problem #2. -Patient on presentation on admission noted to have some peaked T waves with potassium level noted initially on i-STAT of 8.5 and BMET with potassium > 7.5 -Patient received a dose of Lokelma, insulin, dextrose, calcium gluconate, breathing treatments in the ED. -Patient subsequently underwent emergent hemodialysis. -Potassium at 6.0 this morning. -Repeat EKG. -Place on Lokelma 10 mg 3 times daily. -May need hemodialysis today however will defer to nephrology.  4.  Hypertensive urgency -On admission patient noted to have blood pressure of 213/81 received a dose of hydralazine 10 mg IV x 1 and underwent emergent hemodialysis. -Norvasc ordered this morning however noted patient refused.  Placed on hydralazine 25 mg 3 times daily, continue Cozaar 50 mg daily which she seems patient is refusing.  5.  Upper abdominal pain -Patient is of upper abdominal pain stating has been in drain/affecting her hemodialysis sessions. -Patient states due to upper abdominal pain hemodialysis sessions have been short-lived in the past and patient missed 2 hemodialysis sessions this week prior to admission due to upper abdominal pain. -Patient denies any NSAID use. -Patient denies any history of GI bleed or history of ulcers. -Patient states has an appointment to see GI at [facility] in March 2025 however was hoping upper abdominal pain could be further evaluated during this hospitalization. -Place on IV PPI every 12 hours. -Consult GI for  further evaluation and management.  6.  Anemia of chronic disease -Patient with no overt bleeding. -Hemoglobin on admission 8.5 and noted to have been 10-11 back in  2023. -Hemoglobin currently at 8.6 this morning. -Anemia panel with iron of 56, TIBC of 283, ferritin of 3399, folate of 16.6, vitamin B12 of 855. -Follow H&H.  7.  Transaminitis -Felt likely secondary to volume overload. -Acute hepatitis panel with hep B surface antibody quantitative of 9.5, hep B core antibody negative, hep B surface antibody nonreactive, hep B surface antigen nonreactive. -Repeat LFTs in the AM.  8.  Hyperlipidemia -Hold statin secondary to transaminitis. -Would resume statin on discharge.  9.  Morbid obesity -BMI of 41.41 kg/m. -Patient noted to be on Mounjaro in the outpatient setting. -Resume Mounjaro on discharge.   DVT prophylaxis: Heparin Code Status: Full Family Communication: Updated patient.  No family at bedside. Disposition: Likely home when clinically improved.  Status is: Inpatient Remains inpatient appropriate because: Severity of illness   Consultants:  Nephrology: Dr. Allena Katz 11/28/2023  Procedures:  CT abdomen and pelvis 11/28/2023 Chest x-ray 11/28/2023  Antimicrobials:  Anti-infectives (From admission, onward)    None         Subjective: Patient sitting up at the side of the gurney states improvement with shortness of breath since presentation.  On 1-1/2 L nasal cannula with sats of 100%.  Denies any chest pain.  Still with intermittent upper abdominal pain which she describes as a cramping pain and occasionally as a sharp pain.  States not necessarily associated with oral intake.  Denies any NSAID use.  Denied any prior history of peptic ulcer disease.  States abdominal pain usually affects her getting a hemodialysis.  Objective: Vitals:   11/29/23 0645 11/29/23 0700 11/29/23 0715 11/29/23 0835  BP: (!) 170/88 (!) 147/83 (!) 171/84 (!) 177/93  Pulse: 72 73 74 79  Resp: (!) 21 (!) 25 (!) 29 (!) 28  Temp:    97.9 F (36.6 C)  TempSrc:      SpO2: 100% 100% 100% 100%  Weight:      Height:        Intake/Output Summary (Last  24 hours) at 11/29/2023 0904 Last data filed at 11/28/2023 1420 Gross per 24 hour  Intake --  Output 4100 ml  Net -4100 ml   Filed Weights   11/28/23 0720 11/28/23 1036 11/28/23 1419  Weight: 104.3 kg 109.4 kg 104.7 kg    Examination:  General exam: Appears calm and comfortable  Respiratory system: Diffuse crackles.  No wheezing.  Fair air movement.  Speaking in full sentences.   Cardiovascular system: Regular rate rhythm no murmurs rubs or gallops.  No JVD.  No pitting lower extremity edema.  Gastrointestinal system: Abdomen is nondistended, soft and nontender. No organomegaly or masses felt. Normal bowel sounds heard. Central nervous system: Alert and oriented. No focal neurological deficits. Extremities: Symmetric 5 x 5 power. Skin: No rashes, lesions or ulcers Psychiatry: Judgement and insight appear normal. Mood & affect appropriate.     Data Reviewed: I have personally reviewed following labs and imaging studies  CBC: Recent Labs  Lab 11/28/23 0738 11/29/23 0450  WBC 10.5 8.5  HGB 8.5* 8.6*  HCT 27.4* 27.9*  MCV 97.5 98.6  PLT 393 374    Basic Metabolic Panel: Recent Labs  Lab 11/28/23 0738 11/29/23 0450  NA 137 137  K >7.5* 6.0*  CL 97* 95*  CO2 22 28  GLUCOSE 126* 129*  BUN 114* 60*  CREATININE 17.32* 11.13*  CALCIUM 9.7 8.7*  PHOS  --  9.0*    GFR: Estimated Creatinine Clearance: 6.3 mL/min (A) (by C-G formula based on SCr of 11.13 mg/dL (H)).  Liver Function Tests: Recent Labs  Lab 11/28/23 0738 11/29/23 0450  AST 68*  --   ALT 146*  --   ALKPHOS 135*  --   BILITOT 0.6  --   PROT 7.5  --   ALBUMIN 3.4* 2.9*    CBG: No results for input(s): "GLUCAP" in the last 168 hours.   No results found for this or any previous visit (from the past 240 hour(s)).       Radiology Studies: CT ABDOMEN PELVIS WO CONTRAST  Result Date: 11/28/2023 CLINICAL DATA:  Abdominal pain EXAM: CT ABDOMEN AND PELVIS WITHOUT CONTRAST TECHNIQUE: Multidetector  CT imaging of the abdomen and pelvis was performed following the standard protocol without IV contrast. RADIATION DOSE REDUCTION: This exam was performed according to the departmental dose-optimization program which includes automated exposure control, adjustment of the mA and/or kV according to patient size and/or use of iterative reconstruction technique. COMPARISON:  None Available. FINDINGS: Lower chest: Mild bilateral lower lobe atelectasis with mosaic attenuation. Cardiomegaly. Hepatobiliary: Unenhanced liver is unremarkable. Gallbladder is unremarkable. No intrahepatic or extrahepatic duct dilatation. Pancreas: Within normal limits. Spleen: Within normal limits. Adrenals/Urinary Tract: Adrenal glands are within normal limits. Bilateral renal parenchymal atrophy. Suspected small bilateral renal cysts, measuring up to 16 mm in the anterior left lower kidney (series 3/image 38), poorly evaluated. 3 mm nonobstructing bilateral lower pole renal calculi (series 3/image 37). No ureteral or bladder calculi. No hydronephrosis. Bladder is underdistended but unremarkable. Stomach/Bowel: Stomach is within normal limits. No evidence of bowel obstruction. Normal appendix (series 3/image 57). No colonic wall thickening or inflammatory changes. Vascular/Lymphatic: No evidence of abdominal aortic aneurysm. Atherosclerotic calcifications of the abdominal aorta and branch vessels. No suspicious abdominopelvic lymphadenopathy. Reproductive: Uterus is mildly heterogeneous, suggesting uterine fibroids. Bilateral ovaries are within normal limits. Other: No abdominopelvic ascites. Musculoskeletal: Mild degenerative changes of the lower thoracic spine. IMPRESSION: 3 mm nonobstructing bilateral lower pole renal calculi. No ureteral or bladder calculi. No hydronephrosis. Suspected bilateral renal cysts, poorly visualized/evaluated on unenhanced CT. Consider follow-up CT or MRI abdomen with/without contrast in 6 months, as clinically  warranted. Additional ancillary findings as above. Electronically Signed   By: Charline Bills M.D.   On: 11/28/2023 18:23   DG Chest Portable 1 View  Result Date: 11/28/2023 CLINICAL DATA:  missed dialysis dyspnea EXAM: PORTABLE CHEST - 1 VIEW COMPARISON:  11/18/2022. FINDINGS: Cardiac silhouette is prominent. There is pulmonary interstitial prominence with vascular congestion. No focal consolidation. No pneumothorax or pleural effusion identified. IMPRESSION: Findings suggest CHF. Electronically Signed   By: Layla Maw M.D.   On: 11/28/2023 08:51        Scheduled Meds:  amLODipine  5 mg Oral BID   Chlorhexidine Gluconate Cloth  6 each Topical Q0600   heparin  5,000 Units Subcutaneous Q8H   hydrALAZINE  25 mg Oral Q8H   losartan  50 mg Oral Daily   rosuvastatin  10 mg Oral Once per day on Monday Wednesday   sevelamer carbonate  2,400 mg Oral TID WC   sodium chloride flush  3 mL Intravenous Q12H   sodium zirconium cyclosilicate  10 g Oral TID   Continuous Infusions:   LOS: 1 day    Time spent: 40 minutes    Ramiro Harvest, MD [facility]   To  contact the attending provider between 7A-7P or the covering provider during after hours 7P-7A, please log into the web site [email or web address] and access using universal [place] password for that web site. If you do not have the password, please call the hospital operator.  11/29/2023, 9:04 AM

## 2023-11-29 NOTE — Progress Notes (Signed)
[facility] Progress Note  Subjective: 4.1 L removed w/ HD yesterday.  K+ is down to 6.0 this am.   Vitals:   11/29/23 0945 11/29/23 1045 11/29/23 1100 11/29/23 1243  BP: (!) 147/65 (!) 175/89 (!) 171/77   Pulse: 76 75 80   Resp:  19    Temp:    99.3 F (37.4 C)  TempSrc:    Oral  SpO2: 100% 100% 100%   Weight:      Height:        Exam: General: Awake, alert, obese, in acute respiratory distress Head: Sclera not icteric  Lungs: Breathing is labored. Diminished anteriorly. Heart: RRR. No murmur, rubs or gallops.  Abdomen: soft, large, non-tender Lower extremities: (+) LE edema Neuro: AAOx3. Moves all extremities spontaneously. Dialysis Access: R AVF (+) B/T   Dialysis Orders:  [facility] 102kg (per patient)    Last Labs: Hgb 8.5, K >7.5, Ca 9.7,  Alb 3.4   Assessment/Plan:  # Acute hypoxic respiratory failure - 2nd missed HD (12/5 and 12/7). CXR results consistent with CHF. Required Bipap at admit. Had HD here yest w/ 4.1 L off. Will wean off O2 today or tomorrow.   # Hyperkalemia: K+ partially improved, down from > 7.5 to 6.0 today. Will give lokelma, IV bicarb and albuterol nebs today. HD w/ low K+ diet in am tomorrow.   # ESRD - HD TTS. Had HD here yesterday 12/08. Plan HD 1st shift in am tomorrow.   # Hypertension/volume  - Blood pressures were high, a bit better now.   # Anemia of CKD - Hgb 8.5. Will need to obtain outpatient records  # Secondary Hyperparathyroidism - Checking phos in AM. Ca okay.  # Nutrition - Renal diet with fluid restriction once clinically stable     Vinson Moselle MD  [facility] 11/29/2023, 12:55 PM  Recent Labs  Lab 11/28/23 0738 11/29/23 0450  HGB 8.5* 8.6*  ALBUMIN 3.4* 2.9*  CALCIUM 9.7 8.7*  PHOS  --  9.0*  CREATININE 17.32* 11.13*  K >7.5* 6.0*   Recent Labs  Lab 11/28/23 1906  IRON 56  TIBC 283  FERRITIN 3,399*   Inpatient medications:  amLODipine  5 mg Oral BID   Chlorhexidine  Gluconate Cloth  6 each Topical Q0600   heparin  5,000 Units Subcutaneous Q8H   hydrALAZINE  25 mg Oral Q8H   losartan  50 mg Oral Daily   pantoprazole (PROTONIX) IV  40 mg Intravenous Q12H   rosuvastatin  10 mg Oral Once per day on Monday Wednesday   sevelamer carbonate  2,400 mg Oral TID WC   sodium chloride flush  3 mL Intravenous Q12H   sodium zirconium cyclosilicate  10 g Oral TID    acetaminophen **OR** acetaminophen, albuterol

## 2023-11-29 NOTE — Anesthesia Preprocedure Evaluation (Addendum)
Anesthesia Evaluation  Patient identified by MRN, date of birth, ID band Patient awake    Reviewed: Allergy & Precautions, H&P , NPO status , Patient's Chart, lab work & pertinent test results  Airway Mallampati: II   Neck ROM: full    Dental   Pulmonary shortness of breath and with exertion, sleep apnea and Continuous Positive Airway Pressure Ventilation , pneumonia, resolved, former smoker   breath sounds clear to auscultation       Cardiovascular hypertension, Pt. on medications + angina with exertion + CAD and +CHF   Rhythm:regular Rate:Normal  EKG 11/29/23 NSR, LAD, LBBB pattern   Neuro/Psych negative neurological ROS  negative psych ROS   GI/Hepatic Neg liver ROS,,,Epigastric pain N/V   Endo/Other  diabetes, Poorly Controlled, Type 2  Class 3 obesityGLP-1 RA therapy- last dose 12/5 Hyperlipidemia Secondary hyperparathyroidism  Renal/GU ESRF and DialysisRenal diseaseLast dialysis 12/9 Hyperkalemia  negative genitourinary   Musculoskeletal negative musculoskeletal ROS (+)    Abdominal   Peds  Hematology  (+) Blood dyscrasia, anemia   Anesthesia Other Findings   Reproductive/Obstetrics                             Anesthesia Physical Anesthesia Plan  ASA: 4  Anesthesia Plan: MAC   Post-op Pain Management: Minimal or no pain anticipated   Induction: Intravenous  PONV Risk Score and Plan: 3 and Treatment may vary due to age or medical condition and Propofol infusion  Airway Management Planned: Natural Airway and Nasal Cannula  Additional Equipment: None  Intra-op Plan:   Post-operative Plan:   Informed Consent: I have reviewed the patients History and Physical, chart, labs and discussed the procedure including the risks, benefits and alternatives for the proposed anesthesia with the patient or authorized representative who has indicated his/her understanding and acceptance.      Dental advisory given  Plan Discussed with: CRNA, Anesthesiologist and Surgeon  Anesthesia Plan Comments:        Anesthesia Quick Evaluation

## 2023-11-29 NOTE — Consult Note (Signed)
Referring Provider: Dr. Janee Morn Primary Care Physician:  Aliene Beams, MD Primary Gastroenterologist:  Dr. Marca Ancona  Reason for Consultation:  Epigastric pain  HPI: Pamela Campbell is a 62 y.o. female with multiple medical problems as stated below seen for a consultation due to epigastric pain that worsened in the past week and has been having intermittent nonbloody vomiting. Abdominal pain has been intermittent for months and is not associated with eating or any known triggers. She missed dialysis twice last week and reports receiving dialysis last night in hospital and feels better. EGD in 2020 showed chronic inactive gastritis and peptic duodenitis. History of adenomatous polyps and surveillance colonoscopy in Feb 2024 where a tubulovillous adenoma and a tubular adenoma were removed. Denies recent difficulty moving her bowels. Denies melena or hematochezia. Patient says she is not satisfied with the explanations Dr. Marca Ancona has given her in the past regarding her abdominal pain and wants answers as to why she keeps having abdominal pain.  Past Medical History:  Diagnosis Date   Anemia    Chronic diastolic CHF (congestive heart failure) (HCC)    a. 03/2015: echo w/ EF of 50-55%, no WMA, Grade 2 DD, trivial AI, mild MR.   Chronic kidney disease (CKD), stage V (HCC)    dialysis tuesday, thursday saterday   Constipation    Diabetes mellitus without complication (HCC)    CONTROLLED WITH DIET   Dyspnea    non lately   Edema, lower extremity    Hypertension    Overweight    Pneumonia 2016   WHILE HOSPIATLIZED   Shortness of breath     Past Surgical History:  Procedure Laterality Date   AV FISTULA PLACEMENT Right 01/04/2018   Procedure: ARTERIOVENOUS (AV) FISTULA CREATION RIGHT ARM;  Surgeon: Fransisco Hertz, MD;  Location: [facility] OR;  Service: Vascular;  Laterality: Right;   AV FISTULA PLACEMENT Right 01/24/2018   Procedure: ARTERIOVENOUS (AV) FISTULA CREATION RIGHT BRACHIOCEPHALIC;  Surgeon: Fransisco Hertz, MD;  Location: [facility] OR;  Service: Vascular;  Laterality: Right;   BREAST BIOPSY Left 04/07/2023   [place] LT BREAST BX W LOC DEV 1ST LESION IMG BX SPEC US GUIDE 04/07/2023 [facility] MAMMOGRAPHY   CARDIAC CATHETERIZATION N/A 11/02/2016   Procedure: Left Heart Cath and Coronary Angiography;  Surgeon: Tonny Bollman, MD;  Location: [facility] INVASIVE CV LAB;  Service: Cardiovascular;  Laterality: N/A;   CESAREAN SECTION     COLONOSCOPY     DILATATION & CURETTAGE/HYSTEROSCOPY WITH MYOSURE N/A 09/02/2018   Procedure: DILATATION & CURETTAGE/HYSTEROSCOPY WITH MYOSURE;  Surgeon: Gerald Leitz, MD;  Location: [facility] ORS;  Service: Gynecology;  Laterality: N/A;   FISTULA PLUG     FISTULA SUPERFICIALIZATION Right 05/02/2018   Procedure: FISTULA SUPERFICIALIZATION RIGHT ARTERIOVENOUS FISTULA;  Surgeon: Fransisco Hertz, MD;  Location: [facility] OR;  Service: Vascular;  Laterality: Right;   FRACTURE SURGERY     shoulder left   INSERTION OF DIALYSIS CATHETER Right 01/04/2018   Procedure: INSERTION OF DIALYSIS CATHETER RIGHT INTERNAL JUGULAR;  Surgeon: Fransisco Hertz, MD;  Location: [facility] OR;  Service: Vascular;  Laterality: Right;   IR FLUORO GUIDE CV LINE RIGHT  12/31/2017   IR US GUIDE VASC ACCESS RIGHT  12/31/2017   left shoulder rotator     LIGATION OF ARTERIOVENOUS  FISTULA Right 01/24/2018   Procedure: LIGATION OF  RIGHT RADIOCEPHALIC ARTERIOVENOUS  FISTULA;  Surgeon: Fransisco Hertz, MD;  Location: [facility] OR;  Service: Vascular;  Laterality: Right;   TONSILECTOMY, ADENOIDECTOMY, BILATERAL MYRINGOTOMY AND  TUBES     TOOTH EXTRACTION     TUBAL LIGATION      Prior to Admission medications   Medication Sig Start Date End Date Taking? Authorizing Provider  amLODipine (NORVASC) 5 MG tablet Take 1 tablet (5 mg total) by mouth 2 (two) times daily. 02/01/23  Yes Orbie Pyo, MD  losartan (COZAAR) 50 MG tablet Take 1 tablet (50 mg total) by mouth daily. 02/01/23  Yes Orbie Pyo, MD  MOUNJARO 2.5 MG/0.5ML Pen Inject 2.5 mg into the skin  once a week. 11/24/22  Yes [provider]  ondansetron (ZOFRAN-ODT) 4 MG disintegrating tablet Take 4 mg by mouth every 6 (six) hours as needed for nausea or vomiting. 09/07/22  Yes [provider]  rosuvastatin (CRESTOR) 10 MG tablet Take one tablet by mouth 2 days per week--Monday and Wednesday 02/08/23  Yes Orbie Pyo, MD  sevelamer carbonate (RENVELA) 800 MG tablet Take 2,400 mg by mouth 3 (three) times daily with meals.   Yes [provider]  aspirin EC 81 MG tablet Take 1 tablet (81 mg total) by mouth daily. Swallow whole. Patient not taking: Reported on 11/28/2023 10/22/21   Orbie Pyo, MD  hydrALAZINE (APRESOLINE) 25 MG tablet Take 1 tablet (25 mg total) by mouth 2 (two) times daily. 09/02/22 09/02/23  Zannie Cove, MD  calcium carbonate (OS-CAL) 1250 (500 Ca) MG chewable tablet Chew 1 tablet by mouth daily.  08/01/20  [provider]    Scheduled Meds:  amLODipine  5 mg Oral BID   Chlorhexidine Gluconate Cloth  6 each Topical Q0600   heparin  5,000 Units Subcutaneous Q8H   hydrALAZINE  25 mg Oral Q8H   losartan  50 mg Oral Daily   pantoprazole (PROTONIX) IV  40 mg Intravenous Q12H   rosuvastatin  10 mg Oral Once per day on Monday Wednesday   sevelamer carbonate  2,400 mg Oral TID WC   sodium chloride flush  3 mL Intravenous Q12H   sodium zirconium cyclosilicate  10 g Oral TID   Continuous Infusions: PRN Meds:.acetaminophen **OR** acetaminophen, albuterol  Allergies as of 11/28/2023 - Review Complete 11/28/2023  Allergen Reaction Noted   Bean pod extract Nausea And Vomiting 09/22/2022   Compazine [prochlorperazine edisylate] Swelling and Other (See Comments) 01/17/2013   Nsaids Other (See Comments) 12/10/2020   Sensipar [cinacalcet] Nausea And Vomiting 12/10/2020   Vioxx [rofecoxib] Other (See Comments) 01/17/2013   Gean Quint [ferric citrate] Other (See Comments) 09/22/2022   Other Other (See Comments) 12/10/2020    Family History   Problem Relation Age of Onset   Diabetes Mellitus II Mother    Hypertension Mother    Hyperlipidemia Mother    Kidney disease Mother    Eating disorder Mother    Obesity Mother    Heart disease Father    Heart attack Father 49   Congestive Heart Failure Brother    Congestive Heart Failure Son     Social History   Socioeconomic History   Marital status: Divorced    Spouse name: Not on file   Number of children: Not on file   Years of education: Not on file   Highest education level: Not on file  Occupational History   Occupation: [employer]  Tobacco Use   Smoking status: Former    Current packs/day: 0.00    Types: Cigarettes    Quit date: 03/21/1998    Years since quitting: 25.7   Smokeless tobacco: Never  Vaping Use  Vaping status: Never Used  Substance and Sexual Activity   Alcohol use: No   Drug use: No   Sexual activity: Not Currently    Birth control/protection: None  Other Topics Concern   Not on file  Social History Narrative   Not on file   Social Determinants of Health   Financial Resource Strain: Not on file  Food Insecurity: No Food Insecurity (11/29/2023)   Hunger Vital Sign    Worried About Running Out of Food in the Last Year: Never true    Ran Out of Food in the Last Year: Never true  Transportation Needs: No Transportation Needs (11/29/2023)   PRAPARE - Administrator, Civil Service (Medical): No    Lack of Transportation (Non-Medical): No  Physical Activity: Not on file  Stress: Not on file  Social Connections: Not on file  Intimate Partner Violence: Not At Risk (11/29/2023)   Humiliation, Afraid, Rape, and Kick questionnaire    Fear of Current or Ex-Partner: No    Emotionally Abused: No    Physically Abused: No    Sexually Abused: No    Review of Systems: All negative except as stated above in HPI.  Physical Exam: Vital signs: Vitals:   11/29/23 1100 11/29/23 1243  BP: (!) 171/77   Pulse: 80   Resp:    Temp:  99.3 F  (37.4 C)  SpO2: 100%    Last BM Date : 11/27/23 General:  Lethargic, obese, pleasant and cooperative in NAD Head: normocephalic, atraumatic Eyes: anicteric sclera ENT: oropharynx clear Neck: supple, nontender Lungs:  Clear throughout to auscultation.   No wheezes, crackles, or rhonchi. No acute distress. Heart:  Regular rate and rhythm; no murmurs, clicks, rubs,  or gallops. Abdomen: epigastric tenderness with guarding, soft, nondistended, +BS, obese  Rectal:  Deferred Ext: no edema  GI:  Lab Results: Recent Labs    11/28/23 0738 11/29/23 0450  WBC 10.5 8.5  HGB 8.5* 8.6*  HCT 27.4* 27.9*  PLT 393 374   BMET Recent Labs    11/28/23 0738 11/29/23 0450  NA 137 137  K >7.5* 6.0*  CL 97* 95*  CO2 22 28  GLUCOSE 126* 129*  BUN 114* 60*  CREATININE 17.32* 11.13*  CALCIUM 9.7 8.7*   LFT Recent Labs    11/28/23 0738 11/29/23 0450  PROT 7.5  --   ALBUMIN 3.4* 2.9*  AST 68*  --   ALT 146*  --   ALKPHOS 135*  --   BILITOT 0.6  --   BILIDIR <0.1  --   IBILI NOT CALCULATED  --    PT/INR No results for input(s): "LABPROT", "INR" in the last 72 hours.   Studies/Results: CT ABDOMEN PELVIS WO CONTRAST  Result Date: 11/28/2023 CLINICAL DATA:  Abdominal pain EXAM: CT ABDOMEN AND PELVIS WITHOUT CONTRAST TECHNIQUE: Multidetector CT imaging of the abdomen and pelvis was performed following the standard protocol without IV contrast. RADIATION DOSE REDUCTION: This exam was performed according to the departmental dose-optimization program which includes automated exposure control, adjustment of the mA and/or kV according to patient size and/or use of iterative reconstruction technique. COMPARISON:  None Available. FINDINGS: Lower chest: Mild bilateral lower lobe atelectasis with mosaic attenuation. Cardiomegaly. Hepatobiliary: Unenhanced liver is unremarkable. Gallbladder is unremarkable. No intrahepatic or extrahepatic duct dilatation. Pancreas: Within normal limits. Spleen:  Within normal limits. Adrenals/Urinary Tract: Adrenal glands are within normal limits. Bilateral renal parenchymal atrophy. Suspected small bilateral renal cysts, measuring up to 16 mm in  the anterior left lower kidney (series 3/image 38), poorly evaluated. 3 mm nonobstructing bilateral lower pole renal calculi (series 3/image 37). No ureteral or bladder calculi. No hydronephrosis. Bladder is underdistended but unremarkable. Stomach/Bowel: Stomach is within normal limits. No evidence of bowel obstruction. Normal appendix (series 3/image 57). No colonic wall thickening or inflammatory changes. Vascular/Lymphatic: No evidence of abdominal aortic aneurysm. Atherosclerotic calcifications of the abdominal aorta and branch vessels. No suspicious abdominopelvic lymphadenopathy. Reproductive: Uterus is mildly heterogeneous, suggesting uterine fibroids. Bilateral ovaries are within normal limits. Other: No abdominopelvic ascites. Musculoskeletal: Mild degenerative changes of the lower thoracic spine. IMPRESSION: 3 mm nonobstructing bilateral lower pole renal calculi. No ureteral or bladder calculi. No hydronephrosis. Suspected bilateral renal cysts, poorly visualized/evaluated on unenhanced CT. Consider follow-up CT or MRI abdomen with/without contrast in 6 months, as clinically warranted. Additional ancillary findings as above. Electronically Signed   By: Charline Bills M.D.   On: 11/28/2023 18:23   DG Chest Portable 1 View  Result Date: 11/28/2023 CLINICAL DATA:  missed dialysis dyspnea EXAM: PORTABLE CHEST - 1 VIEW COMPARISON:  11/18/2022. FINDINGS: Cardiac silhouette is prominent. There is pulmonary interstitial prominence with vascular congestion. No focal consolidation. No pneumothorax or pleural effusion identified. IMPRESSION: Findings suggest CHF. Electronically Signed   By: Layla Maw M.D.   On: 11/28/2023 08:51    Impression/Plan:  Acute on chronic epigastric pain - peptic ulcer disease possible  and needs an EGD to further evaluate. At discharge she will need to discuss with her primary care physician for referral to another GI group since she has expressed dissatisfaction with Dr. Marca Ancona. NPO p MN. EGD scheduled for tomorrow morning. Supportive care.    LOS: 1 day   Shirley Friar  11/29/2023, 12:49 PM  Questions please call [phone]

## 2023-11-30 DIAGNOSIS — I160 Hypertensive urgency: Secondary | ICD-10-CM | POA: Diagnosis not present

## 2023-11-30 DIAGNOSIS — E875 Hyperkalemia: Secondary | ICD-10-CM | POA: Diagnosis not present

## 2023-11-30 DIAGNOSIS — E8770 Fluid overload, unspecified: Secondary | ICD-10-CM | POA: Diagnosis not present

## 2023-11-30 DIAGNOSIS — R748 Abnormal levels of other serum enzymes: Secondary | ICD-10-CM | POA: Diagnosis not present

## 2023-11-30 LAB — RENAL FUNCTION PANEL
Albumin: 2.8 g/dL — ABNORMAL LOW (ref 3.5–5.0)
Anion gap: 16 — ABNORMAL HIGH (ref 5–15)
BUN: 75 mg/dL — ABNORMAL HIGH (ref 8–23)
CO2: 29 mmol/L (ref 22–32)
Calcium: 8.7 mg/dL — ABNORMAL LOW (ref 8.9–10.3)
Chloride: 94 mmol/L — ABNORMAL LOW (ref 98–111)
Creatinine, Ser: 12.53 mg/dL — ABNORMAL HIGH (ref 0.44–1.00)
GFR, Estimated: 3 mL/min — ABNORMAL LOW (ref >60)
Glucose, Bld: 86 mg/dL (ref 70–99)
Phosphorus: 9.4 mg/dL — ABNORMAL HIGH (ref 2.5–4.6)
Potassium: 5.9 mmol/L — ABNORMAL HIGH (ref 3.5–5.1)
Sodium: 139 mmol/L (ref 135–145)

## 2023-11-30 LAB — CBC WITH DIFFERENTIAL/PLATELET
Abs Immature Granulocytes: 0.06 10*3/uL (ref 0.00–0.07)
Basophils Absolute: 0.1 10*3/uL (ref 0.0–0.1)
Basophils Relative: 1 %
Eosinophils Absolute: 0.2 10*3/uL (ref 0.0–0.5)
Eosinophils Relative: 2 %
HCT: 27.1 % — ABNORMAL LOW (ref 36.0–46.0)
Hemoglobin: 8.5 g/dL — ABNORMAL LOW (ref 12.0–15.0)
Immature Granulocytes: 1 %
Lymphocytes Relative: 18 %
Lymphs Abs: 1.8 10*3/uL (ref 0.7–4.0)
MCH: 30 pg (ref 26.0–34.0)
MCHC: 31.4 g/dL (ref 30.0–36.0)
MCV: 95.8 fL (ref 80.0–100.0)
Monocytes Absolute: 1.2 10*3/uL — ABNORMAL HIGH (ref 0.1–1.0)
Monocytes Relative: 12 %
Neutro Abs: 6.6 10*3/uL (ref 1.7–7.7)
Neutrophils Relative %: 66 %
Platelets: 361 10*3/uL (ref 150–400)
RBC: 2.83 MIL/uL — ABNORMAL LOW (ref 3.87–5.11)
RDW: 16.9 % — ABNORMAL HIGH (ref 11.5–15.5)
WBC: 9.9 10*3/uL (ref 4.0–10.5)
nRBC: 0 % (ref 0.0–0.2)

## 2023-11-30 LAB — GLUCOSE, CAPILLARY: Glucose-Capillary: 79 mg/dL (ref 70–99)

## 2023-11-30 LAB — HEPATIC FUNCTION PANEL
ALT: 70 U/L — ABNORMAL HIGH (ref 0–44)
AST: 19 U/L (ref 15–41)
Albumin: 2.9 g/dL — ABNORMAL LOW (ref 3.5–5.0)
Alkaline Phosphatase: 106 U/L (ref 38–126)
Bilirubin, Direct: <0.1 mg/dL (ref 0.0–0.2)
Total Bilirubin: 0.6 mg/dL (ref <1.2)
Total Protein: 6.2 g/dL — ABNORMAL LOW (ref 6.5–8.1)

## 2023-11-30 LAB — HEPATITIS B SURFACE ANTIBODY, QUANTITATIVE: Hep B S AB Quant (Post): <3.5 m[IU]/mL — ABNORMAL LOW

## 2023-11-30 LAB — MRSA NEXT GEN BY PCR, NASAL: MRSA by PCR Next Gen: NOT DETECTED

## 2023-11-30 LAB — HIV ANTIBODY (ROUTINE TESTING W REFLEX): HIV Screen 4th Generation wRfx: NONREACTIVE

## 2023-11-30 MED ORDER — LIDOCAINE-PRILOCAINE 2.5-2.5 % EX CREA: 1.0000 | TOPICAL_CREAM | CUTANEOUS | Status: DC | PRN

## 2023-11-30 MED ORDER — HEPARIN SODIUM (PORCINE) 1000 UNIT/ML DIALYSIS
2500.0000 [IU] | INTRAMUSCULAR | Status: AC | PRN
Start: 2023-12-01 — End: 2023-11-30
  Administered 2023-11-30: 2500 [IU] via INTRAVENOUS_CENTRAL
  Filled 2023-11-30: qty 3

## 2023-11-30 MED ORDER — HEPARIN SODIUM (PORCINE) 1000 UNIT/ML DIALYSIS: 1000.0000 [IU] | INTRAMUSCULAR | Status: DC | PRN

## 2023-11-30 MED ORDER — SODIUM CHLORIDE 0.9 % IV SOLN: INTRAVENOUS | Status: DC

## 2023-11-30 MED ORDER — LIDOCAINE HCL (PF) 1 % IJ SOLN: 5.0000 mL | INTRAMUSCULAR | Status: DC | PRN

## 2023-11-30 MED ORDER — ALTEPLASE 2 MG IJ SOLR: 2.0000 mg | Freq: Once | INTRAMUSCULAR | Status: DC | PRN

## 2023-11-30 MED ORDER — HEPARIN SODIUM (PORCINE) 1000 UNIT/ML DIALYSIS: 2500.0000 [IU] | Freq: Once | INTRAMUSCULAR | Status: DC

## 2023-11-30 MED ORDER — PENTAFLUOROPROP-TETRAFLUOROETH EX AERO: 1.0000 | INHALATION_SPRAY | CUTANEOUS | Status: DC | PRN

## 2023-11-30 MED ORDER — NEPRO/CARBSTEADY PO LIQD: 237.0000 mL | ORAL | Status: DC | PRN

## 2023-11-30 MED ORDER — ANTICOAGULANT SODIUM CITRATE 4% (200MG/5ML) IV SOLN: 5.0000 mL | Status: DC | PRN

## 2023-11-30 MED ORDER — HEPARIN SODIUM (PORCINE) 1000 UNIT/ML DIALYSIS: 40.0000 [IU]/kg | INTRAMUSCULAR | Status: DC | PRN

## 2023-11-30 MED ORDER — HYDRALAZINE HCL 25 MG PO TABS: 25.0000 mg | ORAL_TABLET | Freq: Two times a day (BID) | ORAL | Status: DC

## 2023-11-30 NOTE — Plan of Care (Signed)
  Problem: Clinical Measurements: Goal: Ability to maintain clinical measurements within normal limits will improve Outcome: Progressing Goal: Will remain free from infection Outcome: Progressing Goal: Respiratory complications will improve Outcome: Progressing Goal: Cardiovascular complication will be avoided Outcome: Progressing   Problem: Nutrition: Goal: Adequate nutrition will be maintained Outcome: Progressing   Problem: Safety: Goal: Ability to remain free from injury will improve Outcome: Progressing

## 2023-11-30 NOTE — ED Notes (Signed)
ED TO INPATIENT HANDOFF REPORT  ED Nurse Name and Phone #:   S Name/Age/Gender Pamela Campbell 62 y.o. female Room/Bed: 046C/046C  Code Status   Code Status: Full Code  Home/SNF/Other Home Patient oriented to: self, place, time, and situation Is this baseline? Yes   Triage Complete: Triage complete  Chief Complaint Hyperkalemia [E87.5]  Triage Note Pt BIB form home for weakness & diarrhea & cough x 1 weak  T, TH, S, Dialysis. Also abdominal pain for "some time now."  Missed last 2 sessions.  SOB on exertion.  Pt thinks she has fluid on her lungs. EMS endorses clear lung sounds.  Non-compliant with medications.. Normal SBP is 170-200 per pt and family.  Pt feels weakest when standing.    Pt hs been on CPAP before, hx of heart failure but not any more per pt.  162 palp, HR 82 96% on 2L, CBG 128 . Pt normally only wears 02 when getting dialysis.     Allergies Allergies  Allergen Reactions   Bean Pod Extract Nausea And Vomiting   Compazine [Prochlorperazine Edisylate] Swelling and Other (See Comments)    "The tongue hardens and comes out of mouth" (Tardive dyskinesia) Also made the neck muscles twist.   Nsaids Other (See Comments)    WAS TOLD TO NOT TAKE THESE DUE TO KIDNEY ISSUES   Sensipar [Cinacalcet] Nausea And Vomiting    Causes vomiting   Vioxx [Rofecoxib] Other (See Comments)    Caused internal bleeding   Gean Quint [Ferric Citrate] Other (See Comments)    extreme constipation   Other Other (See Comments)    Gean Quint -- Binder caused EXTREME constipation    Level of Care/Admitting Diagnosis ED Disposition     ED Disposition  Admit   Condition  --   Comment  Hospital Area: [facility] [[id number]]  Level of Care: Progressive [102]  Admit to Progressive based on following criteria: NEPHROLOGY stable condition requiring close monitoring for AKI, requiring Hemodialysis or Peritoneal Dialysis either from expected electrolyte imbalance, acidosis, or  fluid overload that can be managed by NIPPV or high flow oxygen.  May admit patient to [facility] or [facility] if equivalent level of care is available:: No  Covid Evaluation: Asymptomatic - no recent exposure (last 10 days) testing not required  Diagnosis: Hyperkalemia [102725]  Admitting Physician: Clydie Braun [[id number]]  Attending Physician: Clydie Braun [[id number]]  Certification:: I certify this patient will need inpatient services for at least 2 midnights  Expected Medical Readiness: 11/30/2023          B Medical/Surgery History Past Medical History:  Diagnosis Date   Anemia    Chronic diastolic CHF (congestive heart failure) (HCC)    a. 03/2015: echo w/ EF of 50-55%, no WMA, Grade 2 DD, trivial AI, mild MR.   Chronic kidney disease (CKD), stage V (HCC)    dialysis tuesday, thursday saterday   Constipation    Diabetes mellitus without complication (HCC)    CONTROLLED WITH DIET   Dyspnea    non lately   Edema, lower extremity    Hypertension    Overweight    Pneumonia 2016   WHILE HOSPIATLIZED   Shortness of breath    Past Surgical History:  Procedure Laterality Date   AV FISTULA PLACEMENT Right 01/04/2018   Procedure: ARTERIOVENOUS (AV) FISTULA CREATION RIGHT ARM;  Surgeon: Fransisco Hertz, MD;  Location: [facility] OR;  Service: Vascular;  Laterality: Right;   AV FISTULA PLACEMENT Right  01/24/2018   Procedure: ARTERIOVENOUS (AV) FISTULA CREATION RIGHT BRACHIOCEPHALIC;  Surgeon: Fransisco Hertz, MD;  Location: [facility] OR;  Service: Vascular;  Laterality: Right;   BREAST BIOPSY Left 04/07/2023   [place] LT BREAST BX W LOC DEV 1ST LESION IMG BX SPEC US GUIDE 04/07/2023 [facility] MAMMOGRAPHY   CARDIAC CATHETERIZATION N/A 11/02/2016   Procedure: Left Heart Cath and Coronary Angiography;  Surgeon: Tonny Bollman, MD;  Location: [facility] INVASIVE CV LAB;  Service: Cardiovascular;  Laterality: N/A;   CESAREAN SECTION     COLONOSCOPY     DILATATION & CURETTAGE/HYSTEROSCOPY WITH MYOSURE N/A 09/02/2018    Procedure: DILATATION & CURETTAGE/HYSTEROSCOPY WITH MYOSURE;  Surgeon: Gerald Leitz, MD;  Location: [facility] ORS;  Service: Gynecology;  Laterality: N/A;   FISTULA PLUG     FISTULA SUPERFICIALIZATION Right 05/02/2018   Procedure: FISTULA SUPERFICIALIZATION RIGHT ARTERIOVENOUS FISTULA;  Surgeon: Fransisco Hertz, MD;  Location: [facility] OR;  Service: Vascular;  Laterality: Right;   FRACTURE SURGERY     shoulder left   INSERTION OF DIALYSIS CATHETER Right 01/04/2018   Procedure: INSERTION OF DIALYSIS CATHETER RIGHT INTERNAL JUGULAR;  Surgeon: Fransisco Hertz, MD;  Location: [facility] OR;  Service: Vascular;  Laterality: Right;   IR FLUORO GUIDE CV LINE RIGHT  12/31/2017   IR US GUIDE VASC ACCESS RIGHT  12/31/2017   left shoulder rotator     LIGATION OF ARTERIOVENOUS  FISTULA Right 01/24/2018   Procedure: LIGATION OF  RIGHT RADIOCEPHALIC ARTERIOVENOUS  FISTULA;  Surgeon: Fransisco Hertz, MD;  Location: [facility] OR;  Service: Vascular;  Laterality: Right;   TONSILECTOMY, ADENOIDECTOMY, BILATERAL MYRINGOTOMY AND TUBES     TOOTH EXTRACTION     TUBAL LIGATION       A IV Location/Drains/Wounds Patient Lines/Drains/Airways Status     Active Line/Drains/Airways     Name Placement date Placement time Site Days   Peripheral IV 11/28/23 20 G Left Antecubital 11/28/23  0749  Antecubital  2   Fistula / Graft Right Upper arm 12/10/20  --  Upper arm  1085            Intake/Output Last 24 hours No intake or output data in the 24 hours ending 11/30/23 0042  Labs/Imaging Results for orders placed or performed during the hospital encounter of 11/28/23 (from the past 48 hour(s))  Basic metabolic panel     Status: Abnormal   Collection Time: 11/28/23  7:38 AM  Result Value Ref Range   Sodium 137 135 - 145 mmol/L   Potassium >7.5 (HH) 3.5 - 5.1 mmol/L    Comment: CRITICAL RESULT CALLED TO, READ BACK BY AND VERIFIED WITH Tommy Rainwater RN , @0917 , 11/28/23 , [place], T.   Chloride 97 (L) 98 - 111 mmol/L   CO2 22 22 - 32 mmol/L   Glucose,  Bld 126 (H) 70 - 99 mg/dL    Comment: Glucose reference range applies only to samples taken after fasting for at least 8 hours.   BUN 114 (H) 8 - 23 mg/dL   Creatinine, Ser 44.03 (H) 0.44 - 1.00 mg/dL   Calcium 9.7 8.9 - 47.4 mg/dL   GFR, Estimated 2 (L) >60 mL/min    Comment: (NOTE) Calculated using the CKD-EPI Creatinine Equation (2021)    Anion gap 18 (H) 5 - 15    Comment: Performed at [facility] Lab, [address], [place], [place] [address]  CBC     Status: Abnormal   Collection Time: 11/28/23  7:38 AM  Result Value  Ref Range   WBC 10.5 4.0 - 10.5 K/uL   RBC 2.81 (L) 3.87 - 5.11 MIL/uL   Hemoglobin 8.5 (L) 12.0 - 15.0 g/dL   HCT 65.7 (L) 84.6 - 96.2 %   MCV 97.5 80.0 - 100.0 fL   MCH 30.2 26.0 - 34.0 pg   MCHC 31.0 30.0 - 36.0 g/dL   RDW 95.2 (H) 84.1 - 32.4 %   Platelets 393 150 - 400 K/uL   nRBC 0.0 0.0 - 0.2 %    Comment: Performed at [facility], [address] [place], [place] [address]  Hepatic function panel     Status: Abnormal   Collection Time: 11/28/23  7:38 AM  Result Value Ref Range   Total Protein 7.5 6.5 - 8.1 g/dL   Albumin 3.4 (L) 3.5 - 5.0 g/dL   AST 68 (H) 15 - 41 U/L   ALT 146 (H) 0 - 44 U/L   Alkaline Phosphatase 135 (H) 38 - 126 U/L   Total Bilirubin 0.6 <1.2 mg/dL   Bilirubin, Direct <7.2 0.0 - 0.2 mg/dL   Indirect Bilirubin NOT CALCULATED 0.3 - 0.9 mg/dL    Comment: Performed at [facility] Lab, [address], [place], [place] [address]  Lipase, blood     Status: None   Collection Time: 11/28/23  7:38 AM  Result Value Ref Range   Lipase 29 11 - 51 U/L    Comment: Performed at [facility], [address], [place], [place] [address]  Vitamin B12     Status: None   Collection Time: 11/28/23  7:06 PM  Result Value Ref Range   Vitamin B-12 855 180 - 914 pg/mL    Comment: (NOTE) This assay is not validated for testing neonatal or myeloproliferative syndrome specimens for Vitamin B12 levels. Performed at [facility], [address] [place], [place] [address]   Iron and TIBC     Status: None   Collection Time: 11/28/23  7:06 PM  Result Value Ref Range   Iron 56 28 - 170 ug/dL   TIBC 638 756 - 433 ug/dL   Saturation Ratios 20 10.4 - 31.8 %   UIBC 227 ug/dL    Comment: Performed at [facility], [address] [place], [place] [address]  Ferritin     Status: Abnormal   Collection Time: 11/28/23  7:06 PM  Result Value Ref Range   Ferritin 3,399 (H) 11 - 307 ng/mL    Comment: Performed at [facility], [address], [place], [place] [address]  Folate     Status: None   Collection Time: 11/29/23  4:49 AM  Result Value Ref Range   Folate 16.6 >5.9 ng/mL    Comment: Performed at [facility] Lab, [address], [place], [place] [address]  CBC     Status: Abnormal   Collection Time: 11/29/23  4:50 AM  Result Value Ref Range   WBC 8.5 4.0 - 10.5 K/uL   RBC 2.83 (L) 3.87 - 5.11 MIL/uL   Hemoglobin 8.6 (L) 12.0 - 15.0 g/dL   HCT 60.1 (L) 09.3 - 23.5 %   MCV 98.6 80.0 - 100.0 fL   MCH 30.4 26.0 - 34.0 pg   MCHC 30.8 30.0 - 36.0 g/dL   RDW 57.3 (H) 22.0 - 25.4 %   Platelets 374 150 - 400 K/uL   nRBC 0.0 0.0 - 0.2 %    Comment: Performed  at [facility] Lab, [address], [place], [place] [address]  Renal function panel     Status: Abnormal   Collection Time: 11/29/23  4:50 AM  Result Value Ref Range   Sodium 137 135 - 145 mmol/L   Potassium 6.0 (H) 3.5 - 5.1 mmol/L   Chloride 95 (L) 98 - 111 mmol/L   CO2 28 22 - 32 mmol/L   Glucose, Bld 129 (H) 70 - 99 mg/dL    Comment: Glucose reference range applies only to samples taken after fasting for at least 8 hours.   BUN 60 (H) 8 - 23 mg/dL   Creatinine, Ser 60.45 (H) 0.44 - 1.00 mg/dL   Calcium 8.7 (L) 8.9 - 10.3 mg/dL   Phosphorus 9.0 (H) 2.5 - 4.6 mg/dL   Albumin 2.9 (L) 3.5 - 5.0 g/dL   GFR, Estimated 4 (L) >60 mL/min    Comment: (NOTE) Calculated using the CKD-EPI Creatinine Equation (2021)    Anion gap 14 5 -  15    Comment: Performed at [facility] Lab, [address], [place], [place] [address]  Hepatitis B surface antigen     Status: None   Collection Time: 11/29/23  4:50 AM  Result Value Ref Range   Hepatitis B Surface Ag NON REACTIVE NON REACTIVE    Comment: Performed at [facility], [address], [place], [place] [address]   CT ABDOMEN PELVIS WO CONTRAST  Result Date: 11/28/2023 CLINICAL DATA:  Abdominal pain EXAM: CT ABDOMEN AND PELVIS WITHOUT CONTRAST TECHNIQUE: Multidetector CT imaging of the abdomen and pelvis was performed following the standard protocol without IV contrast. RADIATION DOSE REDUCTION: This exam was performed according to the departmental dose-optimization program which includes automated exposure control, adjustment of the mA and/or kV according to patient size and/or use of iterative reconstruction technique. COMPARISON:  None Available. FINDINGS: Lower chest: Mild bilateral lower lobe atelectasis with mosaic attenuation. Cardiomegaly. Hepatobiliary: Unenhanced liver is unremarkable. Gallbladder is unremarkable. No intrahepatic or extrahepatic duct dilatation. Pancreas: Within normal limits. Spleen: Within normal limits. Adrenals/Urinary Tract: Adrenal glands are within normal limits. Bilateral renal parenchymal atrophy. Suspected small bilateral renal cysts, measuring up to 16 mm in the anterior left lower kidney (series 3/image 38), poorly evaluated. 3 mm nonobstructing bilateral lower pole renal calculi (series 3/image 37). No ureteral or bladder calculi. No hydronephrosis. Bladder is underdistended but unremarkable. Stomach/Bowel: Stomach is within normal limits. No evidence of bowel obstruction. Normal appendix (series 3/image 57). No colonic wall thickening or inflammatory changes. Vascular/Lymphatic: No evidence of abdominal aortic aneurysm. Atherosclerotic calcifications of the abdominal aorta and branch vessels. No suspicious abdominopelvic lymphadenopathy.  Reproductive: Uterus is mildly heterogeneous, suggesting uterine fibroids. Bilateral ovaries are within normal limits. Other: No abdominopelvic ascites. Musculoskeletal: Mild degenerative changes of the lower thoracic spine. IMPRESSION: 3 mm nonobstructing bilateral lower pole renal calculi. No ureteral or bladder calculi. No hydronephrosis. Suspected bilateral renal cysts, poorly visualized/evaluated on unenhanced CT. Consider follow-up CT or MRI abdomen with/without contrast in 6 months, as clinically warranted. Additional ancillary findings as above. Electronically Signed   By: Charline Bills M.D.   On: 11/28/2023 18:23   DG Chest Portable 1 View  Result Date: 11/28/2023 CLINICAL DATA:  missed dialysis dyspnea EXAM: PORTABLE CHEST - 1 VIEW COMPARISON:  11/18/2022. FINDINGS: Cardiac silhouette is prominent. There is pulmonary interstitial prominence with vascular congestion. No focal consolidation. No pneumothorax or pleural effusion identified. IMPRESSION: Findings suggest CHF. Electronically Signed   By: Layla Maw  M.D.   On: 11/28/2023 08:51    Pending Labs Unresulted Labs (From admission, onward)     Start     Ordered   11/30/23 0500  Hepatic function panel  Tomorrow morning,   R        11/29/23 1553   11/30/23 0500  CBC with Differential/Platelet  Tomorrow morning,   R        11/29/23 1553   11/29/23 0500  Renal function panel  Daily,   R      11/28/23 0929   11/29/23 0500  Hepatitis B surface antibody,quantitative  Once-Timed,   TIMED        11/29/23 0245   11/28/23 0950  HIV Antibody (routine testing w rflx)  Once,   R        11/28/23 0950            Vitals/Pain Today's Vitals   11/29/23 2330 11/30/23 0000 11/30/23 0030 11/30/23 0031  BP: (!) 172/81 (!) 165/80 (!) 183/69   Pulse: 82 79 80   Resp: 18 (!) 22 (!) 22   Temp:    98.6 F (37 C)  TempSrc:      SpO2: 94% 93% 94%   Weight:      Height:      PainSc:        Isolation Precautions No active  isolations  Medications Medications  Chlorhexidine Gluconate Cloth 2 % PADS 6 each (6 each Topical Not Given 11/29/23 0615)  heparin injection 5,000 Units (5,000 Units Subcutaneous Patient Refused/Not Given 11/29/23 2238)  sodium chloride flush (NS) 0.9 % injection 3 mL (3 mLs Intravenous Given 11/29/23 2239)  acetaminophen (TYLENOL) tablet 650 mg (has no administration in time range)    Or  acetaminophen (TYLENOL) suppository 650 mg (has no administration in time range)  albuterol (PROVENTIL) (2.5 MG/3ML) 0.083% nebulizer solution 2.5 mg (has no administration in time range)  amLODipine (NORVASC) tablet 5 mg (5 mg Oral Patient Refused/Not Given 11/29/23 2239)  losartan (COZAAR) tablet 50 mg (50 mg Oral Not Given 11/29/23 1006)  sevelamer carbonate (RENVELA) tablet 2,400 mg (2,400 mg Oral Given 11/29/23 1814)  sodium zirconium cyclosilicate (LOKELMA) packet 10 g (10 g Oral Given 11/29/23 2227)  hydrALAZINE (APRESOLINE) tablet 25 mg (25 mg Oral Patient Refused/Not Given 11/29/23 2238)  pantoprazole (PROTONIX) injection 40 mg (40 mg Intravenous Given 11/29/23 2227)  calcium gluconate 1 g/ 50 mL sodium chloride IVPB (0 mg Intravenous Stopped 11/28/23 0900)  insulin aspart (novoLOG) injection 5 Units (5 Units Intravenous Given 11/28/23 0759)  dextrose 50 % solution 50 mL (50 mLs Intravenous Given 11/28/23 0801)  sodium zirconium cyclosilicate (LOKELMA) packet 10 g (10 g Oral Given 11/28/23 0807)  hydrALAZINE (APRESOLINE) injection 10 mg (10 mg Intravenous Given 11/28/23 4098)    Mobility walks     Focused Assessments    R Recommendations: See Admitting Provider Note  Report given to:   Additional Notes:

## 2023-11-30 NOTE — Plan of Care (Signed)
?  Problem: Clinical Measurements: ?Goal: Respiratory complications will improve ?Outcome: Progressing ?Goal: Cardiovascular complication will be avoided ?Outcome: Progressing ?  ?Problem: Safety: ?Goal: Ability to remain free from injury will improve ?Outcome: Progressing ?  ?

## 2023-11-30 NOTE — Progress Notes (Addendum)
PROGRESS NOTE    Pamela Campbell  ZOX:[id number] DOB: 01-22-1961 DOA: 11/28/2023 PCP: Aliene Beams, MD   No chief complaint on file.   Brief Narrative:  Patient is a 62 year old female history of hypertension, CAD, ESRD on HD, type 2 diabetes, obesity presented with shortness of breath.  Patient noted to have missed 2 hemodialysis sessions due to upper abdominal pain.  Patient seen in the ED noted to be significantly volume overloaded, with worsening respiratory status had to be placed on BiPAP.  Chest x-ray concerning for CHF.  Patient also noted to be hyperkalemic, seen in consultation by nephrology and underwent emergent hemodialysis in the ED.  GI consulted for further evaluation of upper abdominal pain.   Assessment & Plan:   Principal Problem:   Fluid overload Active Problems:   (HFpEF) heart failure with preserved ejection fraction (HCC)   ESRD on dialysis (HCC)   Hyperkalemia   Hypertensive urgency   Abdominal pain   Metabolic acidosis   Anemia of chronic disease   Abnormal liver enzymes   Dyslipidemia   Morbid obesity (HCC)  #1 acute respiratory failure with hypoxia secondary to volume overload/acute diastolic CHF exacerbation -Patient presented with shortness of breath, after missing last 2 HD sessions due to abdominal pain. -Patient on presentation to the ED to go into worsening respiratory distress and subsequently placed on BiPAP. -Initial i-STAT labs significant for potassium of 8.5. -Basic metabolic profile done with a potassium > 7.5, CO2 of 22, BUN of 114, creatinine of 17.32, EKG with some T wave peaking. -Chest x-ray done concerning for fluid overload/CHF. -Patient seen in consultation by nephrology underwent emergent hemodialysis in the ED with clinical improvement. -Patient subsequently weaned off BiPAP currently 100% on 2 L nasal cannula.  -Patient underwent hemodialysis today.   -Per nephrology.  2.  ESRD on HD -Patient noted to have missed 2  hemodialysis sessions prior to admission due to complaints of upper abdominal pain. -Patient noted to be hyperkalemic on admission, and acute CHF exacerbation, patient seen in consultation by nephrology and underwent emergent hemodialysis in the ED. -Patient underwent hemodialysis today. -Per nephrology.  3.  Hyperkalemia -Likely secondary to problem #2. -Patient on presentation on admission noted to have some peaked T waves with potassium level noted initially on i-STAT of 8.5 and BMET with potassium > 7.5 -Patient received a dose of Lokelma, insulin, dextrose, calcium gluconate, breathing treatments in the ED. -Patient subsequently underwent emergent hemodialysis. -Potassium at 5.9 this morning. -Patient underwent hemodialysis today. -Continue Lokelma. -DC Cozaar. -Per nephrology.  4.  Hypertensive urgency -On admission patient noted to have blood pressure of 213/81 received a dose of hydralazine 10 mg IV x 1 and underwent emergent hemodialysis. -Continue Norvasc, hydralazine,  -Patient noted to be refusing all antihypertensive medications. -Follow.  5.  Upper abdominal pain -Patient is of upper abdominal pain stating has been in drain/affecting her hemodialysis sessions. -Patient states due to upper abdominal pain hemodialysis sessions have been short-lived in the past and patient missed 2 hemodialysis sessions this week prior to admission due to upper abdominal pain. -Patient denies any NSAID use. -Patient denies any history of GI bleed or history of ulcers. -Patient states has an appointment to see GI at [facility] in March 2025 however was hoping upper abdominal pain could be further evaluated during this hospitalization. -Continue IV PPI every 12 hours. -Patient seen in consultation by gastroenterology and patient for upper endoscopy tomorrow for further evaluation and management. -Appreciate GI input and recommendations.  6.  Anemia of chronic disease -Patient with no overt  bleeding. -Hemoglobin on admission 8.5 and noted to have been 10-11 back in 2023. -Hemoglobin currently at 8.6 this morning. -Anemia panel with iron of 56, TIBC of 283, ferritin of 3399, folate of 16.6, vitamin B12 of 855. -Follow H&H.  7.  Transaminitis -Felt likely secondary to volume overload. -Acute hepatitis panel with hep B surface antibody quantitative of 9.5, hep B core antibody negative, hep B surface antibody nonreactive, hep B surface antigen nonreactive. -LFTs trending down.   -Repeat labs in AM.  8.  Hyperlipidemia -Continue to hold statin secondary to transaminitis.  -Would resume statin on discharge.  9.  Morbid obesity -BMI of 41.41 kg/m. -Patient noted to be on Mounjaro in the outpatient setting. -Resume Mounjaro on discharge.   DVT prophylaxis: Heparin Code Status: Full Family Communication: Updated patient.  No family at bedside. Disposition: Likely home when clinically improved.  Status is: Inpatient Remains inpatient appropriate because: Severity of illness   Consultants:  Nephrology: Dr. Allena Katz 11/28/2023 Gastroenterology: Dr. Bosie Clos 11/29/2023  Procedures:  CT abdomen and pelvis 11/28/2023 Chest x-ray 11/28/2023  Antimicrobials:  Anti-infectives (From admission, onward)    None         Subjective: Patient just finished HD.  Patient somewhat drowsy.  This patient with some complaints of upper abdominal pain.  Patient noted to have refused antihypertensive medications earlier on this morning.  Patient with complaints of intermittent upper abdominal pain.  Objective: Vitals:   11/30/23 1130 11/30/23 1147 11/30/23 1149 11/30/23 1151  BP: 134/66 (!) 109/55  (!) 129/58  Pulse: 79 80  80  Resp: (!) 21 (!) 22  (!) 28  Temp:  98.2 F (36.8 C)    TempSrc:  Oral    SpO2: 100% 99%  100%  Weight:   101.9 kg   Height:        Intake/Output Summary (Last 24 hours) at 11/30/2023 1224 Last data filed at 11/30/2023 1151 Gross per 24 hour  Intake --   Output 2900 ml  Net -2900 ml   Filed Weights   11/30/23 0155 11/30/23 0751 11/30/23 1149  Weight: 104.3 kg 104.5 kg 101.9 kg    Examination:  General exam: NAD. Respiratory system: CTAB anterior lung fields.  No wheezing, fair air movement.  Speaking in full sentences.   Cardiovascular system: RRR no murmurs rubs or gallops.  No JVD.  No pitting lower extremity edema.  Gastrointestinal system: Abdomen is soft, nondistended, some tenderness to palpation upper abdominal region.  Positive bowel sounds.  No rebound.  No guarding.  Central nervous system: Alert and oriented. No focal neurological deficits. Extremities: Symmetric 5 x 5 power. Skin: No rashes, lesions or ulcers Psychiatry: Judgement and insight appear normal. Mood & affect appropriate.     Data Reviewed: I have personally reviewed following labs and imaging studies  CBC: Recent Labs  Lab 11/28/23 0738 11/29/23 0450 11/30/23 0356  WBC 10.5 8.5 9.9  NEUTROABS  --   --  6.6  HGB 8.5* 8.6* 8.5*  HCT 27.4* 27.9* 27.1*  MCV 97.5 98.6 95.8  PLT 393 374 361    Basic Metabolic Panel: Recent Labs  Lab 11/28/23 0738 11/29/23 0450 11/30/23 0357  NA 137 137 139  K >7.5* 6.0* 5.9*  CL 97* 95* 94*  CO2 22 28 29   GLUCOSE 126* 129* 86  BUN 114* 60* 75*  CREATININE 17.32* 11.13* 12.53*  CALCIUM 9.7 8.7* 8.7*  PHOS  --  9.0*  9.4*    GFR: Estimated Creatinine Clearance: 5.5 mL/min (A) (by C-G formula based on SCr of 12.53 mg/dL (H)).  Liver Function Tests: Recent Labs  Lab 11/28/23 0738 11/29/23 0450 11/30/23 0356 11/30/23 0357  AST 68*  --  19  --   ALT 146*  --  70*  --   ALKPHOS 135*  --  106  --   BILITOT 0.6  --  0.6  --   PROT 7.5  --  6.2*  --   ALBUMIN 3.4* 2.9* 2.9* 2.8*    CBG: No results for input(s): "GLUCAP" in the last 168 hours.   Recent Results (from the past 240 hour(s))  MRSA Next Gen by PCR, Nasal     Status: None   Collection Time: 11/30/23  2:22 AM   Specimen: Nasal Mucosa;  Nasal Swab  Result Value Ref Range Status   MRSA by PCR Next Gen NOT DETECTED NOT DETECTED Final    Comment: (NOTE) The GeneXpert MRSA Assay (FDA approved for NASAL specimens only), is one component of a comprehensive MRSA colonization surveillance program. It is not intended to diagnose MRSA infection nor to guide or monitor treatment for MRSA infections. Test performance is not FDA approved in patients less than 14 years old. Performed at [facility], [address] [place], [place] [address]          Radiology Studies: CT ABDOMEN PELVIS WO CONTRAST  Result Date: 11/28/2023 CLINICAL DATA:  Abdominal pain EXAM: CT ABDOMEN AND PELVIS WITHOUT CONTRAST TECHNIQUE: Multidetector CT imaging of the abdomen and pelvis was performed following the standard protocol without IV contrast. RADIATION DOSE REDUCTION: This exam was performed according to the departmental dose-optimization program which includes automated exposure control, adjustment of the mA and/or kV according to patient size and/or use of iterative reconstruction technique. COMPARISON:  None Available. FINDINGS: Lower chest: Mild bilateral lower lobe atelectasis with mosaic attenuation. Cardiomegaly. Hepatobiliary: Unenhanced liver is unremarkable. Gallbladder is unremarkable. No intrahepatic or extrahepatic duct dilatation. Pancreas: Within normal limits. Spleen: Within normal limits. Adrenals/Urinary Tract: Adrenal glands are within normal limits. Bilateral renal parenchymal atrophy. Suspected small bilateral renal cysts, measuring up to 16 mm in the anterior left lower kidney (series 3/image 38), poorly evaluated. 3 mm nonobstructing bilateral lower pole renal calculi (series 3/image 37). No ureteral or bladder calculi. No hydronephrosis. Bladder is underdistended but unremarkable. Stomach/Bowel: Stomach is within normal limits. No evidence of bowel obstruction. Normal appendix (series 3/image 57). No colonic wall thickening or  inflammatory changes. Vascular/Lymphatic: No evidence of abdominal aortic aneurysm. Atherosclerotic calcifications of the abdominal aorta and branch vessels. No suspicious abdominopelvic lymphadenopathy. Reproductive: Uterus is mildly heterogeneous, suggesting uterine fibroids. Bilateral ovaries are within normal limits. Other: No abdominopelvic ascites. Musculoskeletal: Mild degenerative changes of the lower thoracic spine. IMPRESSION: 3 mm nonobstructing bilateral lower pole renal calculi. No ureteral or bladder calculi. No hydronephrosis. Suspected bilateral renal cysts, poorly visualized/evaluated on unenhanced CT. Consider follow-up CT or MRI abdomen with/without contrast in 6 months, as clinically warranted. Additional ancillary findings as above. Electronically Signed   By: Charline Bills M.D.   On: 11/28/2023 18:23        Scheduled Meds:  amLODipine  5 mg Oral BID   heparin  5,000 Units Subcutaneous Q8H   hydrALAZINE  25 mg Oral BID   losartan  50 mg Oral Daily   pantoprazole (PROTONIX) IV  40 mg Intravenous Q12H   sevelamer carbonate  2,400 mg Oral TID WC  sodium chloride flush  3 mL Intravenous Q12H   sodium zirconium cyclosilicate  10 g Oral TID   Continuous Infusions:  sodium chloride     anticoagulant sodium citrate       LOS: 2 days    Time spent: 40 minutes    Ramiro Harvest, MD [facility]   To contact the attending provider between 7A-7P or the covering provider during after hours 7P-7A, please log into the web site [email or web address] and access using universal [place] password for that web site. If you do not have the password, please call the hospital operator.  11/30/2023, 12:24 PM

## 2023-11-30 NOTE — Progress Notes (Signed)
[facility] Progress Note  Subjective: 2.9 L removed w/ HD this am. Pt w/o any c/o's today. States she is not getting heparin at her HD unit.   Vitals:   11/30/23 1147 11/30/23 1149 11/30/23 1151 11/30/23 1233  BP: (!) 109/55  (!) 129/58 (!) 143/67  Pulse: 80  80 79  Resp: (!) 22  (!) 28 18  Temp: 98.2 F (36.8 C)   98.5 F (36.9 C)  TempSrc: Oral   Oral  SpO2: 99%  100% 100%  Weight:  101.9 kg    Height:        Exam: General: Awake, alert, obese, in acute respiratory distress Head: Sclera not icteric  Lungs: Breathing is labored. Diminished anteriorly. Heart: RRR. No murmur, rubs or gallops.  Abdomen: soft, large, non-tender Lower extremities: (+) LE edema Neuro: AAOx3. Moves all extremities spontaneously. Dialysis Access: R AVF (+) B/T   Dialysis Orders:  [facility] 102kg (per patient), no heparin    Last Labs: Hgb 8.5, K >7.5, Ca 9.7,  Alb 3.4   Assessment/Plan:  # Acute hypoxic respiratory failure - 2nd missed HD (12/5 and 12/7). Initial CXR results consistent with CHF. Required Bipap at admit. Had HD here 12/08 w/ 4.1 L off. Had HD this am again w/ 2.9 L off. Is down to dry wt post HD.   # Hyperkalemia: K+ partially improved, down from > 7.5 on admit to 6.0 yesterday. Will use 1.5 hrs of 1K bath w/ HD today. F/u in am.   # ESRD - HD TTS. Had HD here 12/08 and this am. Next HD 12/12.   # Hypertension/volume  - Blood pressures were high initially, wnl now.   # Anemia of CKD - Hgb 8.5.  GI is seeing for poss endoscopy.    # Secondary Hyperparathyroidism - high phos, CCa in range. Cont binders w/ meals  # Nutrition - Renal diet with fluid restriction once clinically stable     Vinson Moselle MD  [facility] 11/30/2023, 3:41 PM  Recent Labs  Lab 11/29/23 0450 11/30/23 0356 11/30/23 0357  HGB 8.6* 8.5*  --   ALBUMIN 2.9* 2.9* 2.8*  CALCIUM 8.7*  --  8.7*  PHOS 9.0*  --  9.4*  CREATININE 11.13*  --  12.53*  K 6.0*  --  5.9*    Recent Labs  Lab 11/28/23 1906  IRON 56  TIBC 283  FERRITIN 3,399*   Inpatient medications:  amLODipine  5 mg Oral BID   heparin  5,000 Units Subcutaneous Q8H   hydrALAZINE  25 mg Oral BID   losartan  50 mg Oral Daily   pantoprazole (PROTONIX) IV  40 mg Intravenous Q12H   sevelamer carbonate  2,400 mg Oral TID WC   sodium chloride flush  3 mL Intravenous Q12H   sodium zirconium cyclosilicate  10 g Oral TID    sodium chloride     acetaminophen **OR** acetaminophen, albuterol

## 2023-11-30 NOTE — Progress Notes (Signed)
   11/30/23 4401  Assess: MEWS Score  BP (!) 162/84  MAP (mmHg) 107  ECG Heart Rate 80  Level of Consciousness Alert  O2 Device Nasal Cannula  Patient Activity (if Appropriate) In bed  O2 Flow Rate (L/min) 2 L/min  Assess: MEWS Score  MEWS Temp 0  MEWS Systolic 0  MEWS Pulse 0  MEWS RR 0  MEWS LOC 0  MEWS Score 0  MEWS Score Color Green  Provider Notification  Provider Name/Title Dr. Janalyn Shy  Date Provider Notified 11/30/23  Time Provider Notified [phone]  Method of Notification Page [facility] chat)  Notification Reason Red med refusal (Refused heparin [place] and Hydralazine PO)  Provider response At bedside (Dr Janalyn Shy came earlier to bedside to speak with pt. Pt still refused)  Date of Provider Response 11/30/23  Time of Provider Response 0636  Assess: SIRS CRITERIA  SIRS Temperature  0  SIRS Pulse 0  SIRS Respirations  0  SIRS WBC 0  SIRS Score Sum  0

## 2023-11-30 NOTE — Progress Notes (Signed)
Received patient in bed to unit.  Alert and oriented.  Informed consent signed and in chart.   TX duration:3.5  Patient tolerated well.  Transported back to the room  Alert, without acute distress.  Hand-off given to patient's nurse.   Access used: RIGHT AVF Access issues: NONE  Total UF removed: 2.9L Medication(s) given: NONE   11/30/23 1147  Vitals  Temp 98.2 F (36.8 C)  Temp Source Oral  BP (!) 109/55  MAP (mmHg) 71  BP Location Left Wrist  BP Method Automatic  Patient Position (if appropriate) Lying  Pulse Rate 80  Pulse Rate Source Monitor  ECG Heart Rate 81  Resp (!) 22  Oxygen Therapy  SpO2 99 %  O2 Device Nasal Cannula  O2 Flow Rate (L/min) 2 L/min  During Treatment Monitoring  Blood Flow Rate (mL/min) 400 mL/min  Arterial Pressure (mmHg) -206.45 mmHg  Venous Pressure (mmHg) 254.33 mmHg  TMP (mmHg) 33.53 mmHg  Ultrafiltration Rate (mL/min) 2000 mL/min  Dialysate Flow Rate (mL/min) 300 ml/min  Dialysate Potassium Concentration 2  Dialysate Calcium Concentration 2.5  Duration of HD Treatment -hour(s) 3.5 hour(s)  Cumulative Fluid Removed (mL) per Treatment  2938.24  HD Safety Checks Performed Yes  Intra-Hemodialysis Comments Tx completed  Dialysis Fluid Bolus Normal Saline  Bolus Amount (mL) 300 mL      Cullen Lahaie S [facility]

## 2023-11-30 NOTE — Progress Notes (Signed)
Patient's BP upon arrival from ED was elevated at 179/98. She has been refusing to take any antihypertensive meds. She insists that her normal BP is systolics of 170s and patient states she feels lightheaded (almost feeling like passing out) when her pressures go systolic of below 140s. Discussed with patient the importance of BP control. However, patient states she wants to keep her blood pressure high and refuses to get antihypertensive meds. Dr Janalyn Shy made aware.

## 2023-11-30 NOTE — Progress Notes (Signed)
Hypertensive urgency: Patient declining oral blood pressure regimen unless blood pressure goes up to above 200. -Patient's blood pressure persistently systolic between 188-416 and diastolic in between 69-102.  Per chart review patient has been admitted for acute hypoxic respiratory failure in the setting of missing dialysis and volume overload.  Nephrology on board and plan for dialysis in the morning 12/10.  Received a call from bedside nurse that patient is declining oral blood pressure regimen in the nighttime even though blood pressure is persistently elevated 179/80. Patient is stating that she is more comfortable to keeping her blood pressure elevated to upper 170s that is usually her baseline rather than getting it down to below 140.  Reporting she has a primary care doctor at [facility] and recommended to keep her blood blood pressure elevated as she feels dizzy and confused and feels of impending doom when blood pressure drops to 14 to 150. Patient is telling me that she takes amlodipine 2.5 mg as needed at home, hydralazine 25 mg as needed at home and losartan 50 mg as needed at home instead of taking it on daily basis.  I have explained patient at the bedside that having persistently elevated blood pressure will place her having another stroke and having uncontrolled blood pressure for light long time will cause congestive heart failure as well.  Patient is saying me that she wants to take her risk to having the stroke rather than feeling dizzy and feeling dying when blood pressure is soft.  Patient states saying that she would like to allow her systolic blood pressure go up to 200 but if it is above 200 in that case she will only take oral amlodipine 2.5 mg not any other medication given not any IV as needed medication. Basically she is declining oral medication and as needed medication until systolic blood pressure goes up to above 200.  Informed bedside nurse regarding this conversation. -  Informed bedside nurse to give her amlodipine 2.5 mg if systolic blood pressure goes up to above 200 which is patient's final wish and decision.  Tereasa Coop, MD [facility] 11/30/2023, 5:05 AM

## 2023-12-01 ENCOUNTER — Inpatient Hospital Stay (HOSPITAL_COMMUNITY): Payer: Medicare HMO | Admitting: Anesthesiology

## 2023-12-01 ENCOUNTER — Inpatient Hospital Stay (HOSPITAL_COMMUNITY): Payer: Self-pay | Admitting: Anesthesiology

## 2023-12-01 ENCOUNTER — Other Ambulatory Visit (HOSPITAL_COMMUNITY): Payer: Self-pay

## 2023-12-01 ENCOUNTER — Encounter (HOSPITAL_COMMUNITY)
Admission: EM | Disposition: A | Discharge status: Home / Self Care | Payer: Self-pay | Source: Home / Self Care | Attending: Internal Medicine

## 2023-12-01 DIAGNOSIS — K2970 Gastritis, unspecified, without bleeding: Secondary | ICD-10-CM

## 2023-12-01 DIAGNOSIS — I2510 Atherosclerotic heart disease of native coronary artery without angina pectoris: Secondary | ICD-10-CM

## 2023-12-01 DIAGNOSIS — E875 Hyperkalemia: Secondary | ICD-10-CM | POA: Diagnosis not present

## 2023-12-01 HISTORY — PX: BIOPSY: SHX5522

## 2023-12-01 HISTORY — PX: ESOPHAGOGASTRODUODENOSCOPY (EGD) WITH PROPOFOL: SHX5813

## 2023-12-01 LAB — RENAL FUNCTION PANEL
Albumin: 3 g/dL — ABNORMAL LOW (ref 3.5–5.0)
Anion gap: 12 (ref 5–15)
BUN: 47 mg/dL — ABNORMAL HIGH (ref 8–23)
CO2: 29 mmol/L (ref 22–32)
Calcium: 9.3 mg/dL (ref 8.9–10.3)
Chloride: 95 mmol/L — ABNORMAL LOW (ref 98–111)
Creatinine, Ser: 9.16 mg/dL — ABNORMAL HIGH (ref 0.44–1.00)
GFR, Estimated: 4 mL/min — ABNORMAL LOW (ref >60)
Glucose, Bld: 125 mg/dL — ABNORMAL HIGH (ref 70–99)
Phosphorus: 6.5 mg/dL — ABNORMAL HIGH (ref 2.5–4.6)
Potassium: 4.7 mmol/L (ref 3.5–5.1)
Sodium: 136 mmol/L (ref 135–145)

## 2023-12-01 LAB — HEPATIC FUNCTION PANEL
ALT: 54 U/L — ABNORMAL HIGH (ref 0–44)
AST: 19 U/L (ref 15–41)
Albumin: 3 g/dL — ABNORMAL LOW (ref 3.5–5.0)
Alkaline Phosphatase: 115 U/L (ref 38–126)
Bilirubin, Direct: <0.1 mg/dL (ref 0.0–0.2)
Total Bilirubin: 0.6 mg/dL (ref <1.2)
Total Protein: 6.5 g/dL (ref 6.5–8.1)

## 2023-12-01 LAB — CBC
HCT: 29.5 % — ABNORMAL LOW (ref 36.0–46.0)
Hemoglobin: 9.3 g/dL — ABNORMAL LOW (ref 12.0–15.0)
MCH: 30 pg (ref 26.0–34.0)
MCHC: 31.5 g/dL (ref 30.0–36.0)
MCV: 95.2 fL (ref 80.0–100.0)
Platelets: 351 10*3/uL (ref 150–400)
RBC: 3.1 MIL/uL — ABNORMAL LOW (ref 3.87–5.11)
RDW: 16.7 % — ABNORMAL HIGH (ref 11.5–15.5)
WBC: 10 10*3/uL (ref 4.0–10.5)
nRBC: 0 % (ref 0.0–0.2)

## 2023-12-01 LAB — MAGNESIUM: Magnesium: 2 mg/dL (ref 1.7–2.4)

## 2023-12-01 SURGERY — ESOPHAGOGASTRODUODENOSCOPY (EGD) WITH PROPOFOL
Anesthesia: Monitor Anesthesia Care

## 2023-12-01 MED ORDER — LIDOCAINE 2% (20 MG/ML) 5 ML SYRINGE
INTRAMUSCULAR | Status: DC | PRN
  Administered 2023-12-01: 80 mg via INTRAVENOUS

## 2023-12-01 MED ORDER — ONDANSETRON HCL 4 MG/2ML IJ SOLN
INTRAMUSCULAR | Status: DC | PRN
  Administered 2023-12-01: 4 mg via INTRAVENOUS

## 2023-12-01 MED ORDER — PANTOPRAZOLE SODIUM 40 MG PO TBEC
40.0000 mg | DELAYED_RELEASE_TABLET | Freq: Every day | ORAL | 0 refills | Status: AC
Start: 2023-12-01 — End: 2024-01-30
  Filled 2023-12-01: qty 60, 60d supply, fill #0

## 2023-12-01 MED ORDER — PROPOFOL 10 MG/ML IV BOLUS
INTRAVENOUS | Status: DC | PRN
  Administered 2023-12-01: 20 mg via INTRAVENOUS
  Administered 2023-12-01: 50 mg via INTRAVENOUS
  Administered 2023-12-01 (×2): 30 mg via INTRAVENOUS

## 2023-12-01 MED ORDER — CHLORHEXIDINE GLUCONATE CLOTH 2 % EX PADS: 6.0000 | MEDICATED_PAD | Freq: Every day | CUTANEOUS | Status: DC

## 2023-12-01 MED ORDER — SODIUM CHLORIDE 0.9 % IV SOLN
INTRAVENOUS | Status: AC | PRN
  Administered 2023-12-01: 500 mL via INTRAMUSCULAR

## 2023-12-01 SURGICAL SUPPLY — 14 items

## 2023-12-01 NOTE — Plan of Care (Signed)
  Problem: Clinical Measurements: Goal: Ability to maintain clinical measurements within normal limits will improve Outcome: Progressing Goal: Respiratory complications will improve Outcome: Progressing Goal: Cardiovascular complication will be avoided Outcome: Progressing   Problem: Safety: Goal: Ability to remain free from injury will improve Outcome: Progressing

## 2023-12-01 NOTE — Op Note (Signed)
[facility] Patient Name: Pamela Campbell Procedure Date : 12/01/2023 MRN: [id number] Attending MD: Shirley Friar , MD, [id number] Date of Birth: [birth year] CSN: [id number] Age: 62 Admit Type: Inpatient Procedure:                Upper GI endoscopy Indications:              Epigastric abdominal pain Providers:                Shirley Friar, MD, Fransisca Connors, Salley Scarlet, Technician, Stephanie Coup, CRNA Referring MD:             hospital team Medicines:                Propofol per Anesthesia, Monitored Anesthesia Care Complications:            No immediate complications. Estimated Blood Loss:     Estimated blood loss was minimal. Procedure:                Pre-Anesthesia Assessment:                           - Prior to the procedure, a History and Physical                            was performed, and patient medications and                            allergies were reviewed. The patient's tolerance of                            previous anesthesia was also reviewed. The risks                            and benefits of the procedure and the sedation                            options and risks were discussed with the patient.                            All questions were answered, and informed consent                            was obtained. Prior Anticoagulants: The patient has                            taken no anticoagulant or antiplatelet agents. ASA                            Grade Assessment: IV - A patient with severe                            systemic disease that is a constant threat to life.  After reviewing the risks and benefits, the patient                            was deemed in satisfactory condition to undergo the                            procedure.                           After obtaining informed consent, the endoscope was                            passed under direct vision.  Throughout the                            procedure, the patient's blood pressure, pulse, and                            oxygen saturations were monitored continuously. The                            [id number]-[id number] ([id number] Olympus endoscope was introduced                            through the mouth, and advanced to the second part                            of duodenum. The upper GI endoscopy was                            accomplished without difficulty. The patient                            tolerated the procedure well. Scope In: Scope Out: Findings:      The examined esophagus was normal.      The Z-line was regular and was found 40 cm from the incisors.      Segmental moderate inflammation characterized by congestion (edema),       erosions and erythema was found in the gastric antrum. Biopsies were       taken with a cold forceps for histology. Estimated blood loss was       minimal.      The cardia and gastric fundus were normal on retroflexion.      Localized nodular mucosa was found in the second portion of the       duodenum. Biopsies were taken with a cold forceps for histology.       Estimated blood loss was minimal.      The exam of the duodenum was otherwise normal. Impression:               - Normal esophagus.                           - Z-line regular, 40 cm from the incisors.                           -  Acute gastritis. Biopsied.                           - Nodular mucosa in the second portion of the                            duodenum. Biopsied. Recommendation:           - Resume previous diet.                           - Await pathology results.                           - Observe patient's clinical course. Procedure Code(s):        --- Professional ---                           [phone], Esophagogastroduodenoscopy, flexible,                            transoral; with biopsy, single or multiple Diagnosis Code(s):        --- Professional ---                            R10.13, Epigastric pain                           K29.00, Acute gastritis without bleeding                           K31.89, Other diseases of stomach and duodenum CPT copyright 2022 [employer]. All rights reserved. The codes documented in this report are preliminary and upon coder review may  be revised to meet current compliance requirements. Shirley Friar, MD 12/01/2023 12:32:52 PM This report has been signed electronically. Number of Addenda: 0

## 2023-12-01 NOTE — Progress Notes (Signed)
Patience completed discharge with patient, meds given to patient at bedside.

## 2023-12-01 NOTE — Interval H&P Note (Signed)
History and Physical Interval Note:  12/01/2023 11:06 AM  Pamela Campbell  has presented today for surgery, with the diagnosis of Epigastric pain; Nausea and vomiting.  The various methods of treatment have been discussed with the patient and family. After consideration of risks, benefits and other options for treatment, the patient has consented to  Procedure(s): ESOPHAGOGASTRODUODENOSCOPY (EGD) WITH PROPOFOL (N/A) as a surgical intervention.  The patient's history has been reviewed, patient examined, no change in status, stable for surgery.  I have reviewed the patient's chart and labs.  Questions were answered to the patient's satisfaction.     Shirley Friar

## 2023-12-01 NOTE — Progress Notes (Signed)
[facility] Progress Note  Subjective: went for EGD this morning.  Told me today that she can't tolerate BP's under 140 while doing dialysis. I told her that was very unusual and that we haven/t known about that while she has been here.   Vitals:   12/01/23 1211 12/01/23 1221 12/01/23 1248 12/01/23 1546  BP: 123/63 129/67 (!) 140/69 (!) 146/70  Pulse: 82 77 84 71  Resp: (!) 24 (!) 23 20 20   Temp:    98.6 F (37 C)  TempSrc:    Oral  SpO2: 92% 97%  96%  Weight:      Height:        Exam: General: Awake, alert, obese, in acute respiratory distress Head: Sclera not icteric  Lungs: Breathing is labored. Diminished anteriorly. Heart: RRR. No murmur, rubs or gallops.  Abdomen: soft, large, non-tender Lower extremities: (+) LE edema Neuro: AAOx3. Moves all extremities spontaneously. Dialysis Access: R AVF (+) B/T   Dialysis Orders:  [facility]/ [facility] 102kg (per patient), no heparin    Last Labs: Hgb 8.5, K >7.5, Ca 9.7,  Alb 3.4   Assessment/Plan:  # Acute hypoxic respiratory failure - 2nd missed HD (12/5 and 12/7). Initial CXR results consistent with CHF. Required Bipap at admit. Had HD here 12/08 w/ 4.1 L off. Had HD this am again w/ 2.9 L off. Is down to dry wt post HD.   # Bloody emesis/ abd pain: comes and goes, Hb 8-9.5.  seen by GI, had EGD today showing gastritis. Per GI.   # Hyperkalemia: K+ improved from > 7.5 on admit to 6.0 yesterday and down to 4.7 today.  Resolved.   # ESRD - HD TTS. Had HD here 12/08 and 12/10. Next HD tomorrow 1st shift.    # Hypertension/volume  - Blood pressures were high initially, wnl now.   # Anemia of CKD - Hgb 8.5- 9.5, follow.   # Secondary Hyperparathyroidism - high phos, CCa in range. Cont binders w/ meals  # Nutrition - Renal diet with fluid restriction once clinically stable     Rob Arlean Hopping MD  [facility] 12/01/2023, 4:20 PM  Recent Labs  Lab 11/30/23 0356 11/30/23 0357 12/01/23 0414  12/01/23 0415  HGB 8.5*  --  9.3*  --   ALBUMIN 2.9* 2.8* 3.0* 3.0*  CALCIUM  --  8.7*  --  9.3  PHOS  --  9.4*  --  6.5*  CREATININE  --  12.53*  --  9.16*  K  --  5.9*  --  4.7   Recent Labs  Lab 11/28/23 1906  IRON 56  TIBC 283  FERRITIN 3,399*   Inpatient medications:  amLODipine  5 mg Oral BID   heparin  5,000 Units Subcutaneous Q8H   hydrALAZINE  25 mg Oral BID   losartan  50 mg Oral Daily   pantoprazole (PROTONIX) IV  40 mg Intravenous Q12H   sevelamer carbonate  2,400 mg Oral TID WC   sodium chloride flush  3 mL Intravenous Q12H   sodium zirconium cyclosilicate  10 g Oral TID     acetaminophen **OR** acetaminophen, albuterol

## 2023-12-01 NOTE — Discharge Summary (Signed)
Physician Discharge Summary  Pamela Campbell ZOX:[id number] DOB: 1961/11/11 DOA: 11/28/2023  PCP: Aliene Beams, MD  Admit date: 11/28/2023 Discharge date: 12/01/2023  Time spent: 40 minutes  Recommendations for Outpatient Follow-up:  Follow outpatient CBC/CMP  Encourage adherence with dialysis and home blood pressure regimen Follow abdominal pain outpatient - resolved today -> follow biopsy results from EGD outpatient   Needs repeat renal imaging within 6 months (for suspected bilateral renal cysts)  Discharge Diagnoses:  Principal Problem:   Fluid overload Active Problems:   (HFpEF) heart failure with preserved ejection fraction (HCC)   ESRD on dialysis (HCC)   Hyperkalemia   Hypertensive urgency   Abdominal pain   Metabolic acidosis   Anemia of chronic disease   Abnormal liver enzymes   Dyslipidemia   Morbid obesity (HCC)   Discharge Condition: stable  Diet recommendation: heart healthy, renal   Filed Weights   11/30/23 1149 12/01/23 0339 12/01/23 1013  Weight: 101.9 kg 101.8 kg 101.8 kg    History of present illness:   Patient is Pamela Campbell 62 year old female history of hypertension, CAD, ESRD on HD, type 2 diabetes, obesity presented with shortness of breath. Patient noted to have missed 2 hemodialysis sessions due to upper abdominal pain. Patient seen in the ED noted to be significantly volume overloaded, with worsening respiratory status had to be placed on BiPAP. Chest x-ray concerning for CHF. Patient also noted to be hyperkalemic, seen in consultation by nephrology and underwent emergent hemodialysis in the ED. GI consulted for further evaluation of upper abdominal pain.   She's improved from  resp and volume status after dialysis.  S/p EGD with gastritis.    Stable for discharge, see below for additional details.  Hospital Course:  Assessment and Plan:  #1 acute respiratory failure with hypoxia secondary to volume overload/acute diastolic CHF exacerbation -Patient  presented with shortness of breath, after missing last 2 HD sessions due to abdominal pain. -improved now after dialysis -follow outpatient, encourage compliance with dialysis   2.  ESRD on HD -s/p dialysis per renal, stable for discharge   3.  Hyperkalemia resolved   4.  Hypertensive urgency - related to overload, also nonadherence - encourage compliance with home BP meds  5.  Upper abdominal pain - improved -> CT without identifiable cause - s/p EGD with gastritis (biopsied) and nodular mucosa in duodenum (biopsied)  -will need to follow pending path results with GI outpatient - will discharge with daily PPI    6.  Anemia of chronic disease - stable   7.  Transaminitis -Felt likely secondary to volume overload. -improving, follow outpatient   8.  Hyperlipidemia -resume statin at discharge   # Suspected Bilateral Renal Cyst - needs repeat imaging outpatient   9.  Morbid obesity -Body mass index is 38.52 kg/m. -Resume Mounjaro on discharge.    Procedures:  EGD - Normal esophagus. - Z- line regular, 40 cm from the incisors. - Acute gastritis. Biopsied. - Nodular mucosa in the second portion of the duodenum. Biopsied. Recommendation: - Resume previous diet. - Await pathology results. - Observe patient' s clinical course.  Consultations: GI nephrology  Discharge Exam: Vitals:   12/01/23 1248 12/01/23 1546  BP: (!) 140/69 (!) 146/70  Pulse: 84 71  Resp: 20 20  Temp:  98.6 F (37 C)  SpO2:  96%   No complaints Eager to discharge if possible  General: No acute distress. Cardiovascular: RRR Lungs: unlabored Neurological: Alert and oriented 3. Moves all extremities 4 with equal  strength. Cranial nerves II through XII grossly intact. Extremities: No clubbing or cyanosis. No edema.   Discharge Instructions   Discharge Instructions     Call MD for:  difficulty breathing, headache or visual disturbances   Complete by: As directed    Call MD for:   extreme fatigue   Complete by: As directed    Call MD for:  hives   Complete by: As directed    Call MD for:  persistant dizziness or light-headedness   Complete by: As directed    Call MD for:  persistant nausea and vomiting   Complete by: As directed    Call MD for:  redness, tenderness, or signs of infection (pain, swelling, redness, odor or green/yellow discharge around incision site)   Complete by: As directed    Call MD for:  severe uncontrolled pain   Complete by: As directed    Call MD for:  temperature >100.4   Complete by: As directed    Diet - low sodium heart healthy   Complete by: As directed    Discharge instructions   Complete by: As directed    You were seen for volume overload and hyperkalemia (high potassium) due to missing dialysis.  You've improved after dialysis here.  Continue your home blood pressure medicines.  These should be taken as prescribed.  Uncontrolled blood pressure puts you at increased risk of heart attack, stroke, and other complications.    You had an endoscopy that showed gastritis.  This in addition to nodular mucosa at the duodenum was biopsied.  Gastroenterology should reach out to you with the results.  If you don't hear from GI, please call the office after about 1 week.    Return for new, recurrent, or worsening symptoms.    Please ask your PCP to request records from this hospitalization so they know what was done and what the next steps will be.   Increase activity slowly   Complete by: As directed       Allergies as of 12/01/2023       Reactions   Bean Pod Extract Nausea And Vomiting   Compazine [prochlorperazine Edisylate] Swelling, Other (See Comments)   "The tongue hardens and comes out of mouth" (Tardive dyskinesia) Also made the neck muscles twist.   Nsaids Other (See Comments)   WAS TOLD TO NOT TAKE THESE DUE TO KIDNEY ISSUES   Sensipar [cinacalcet] Nausea And Vomiting   Causes vomiting   Vioxx [rofecoxib] Other (See  Comments)   Caused internal bleeding   Auryxia [ferric Citrate] Other (See Comments)   extreme constipation   Other Other (See Comments)   Gean Quint -- Binder caused EXTREME constipation        Medication List     TAKE these medications    amLODipine 5 MG tablet Commonly known as: NORVASC Take 1 tablet (5 mg total) by mouth 2 (two) times daily.   aspirin EC 81 MG tablet Take 1 tablet (81 mg total) by mouth daily. Swallow whole.   hydrALAZINE 25 MG tablet Commonly known as: APRESOLINE Take 1 tablet (25 mg total) by mouth 2 (two) times daily.   losartan 50 MG tablet Commonly known as: COZAAR Take 1 tablet (50 mg total) by mouth daily.   Mounjaro 2.5 MG/0.5ML Pen Generic drug: tirzepatide Inject 2.5 mg into the skin once Dailyn Reith week.   ondansetron 4 MG disintegrating tablet Commonly known as: ZOFRAN-ODT Take 4 mg by mouth every 6 (six) hours as needed for nausea  or vomiting.   pantoprazole 40 MG tablet Commonly known as: Protonix Take 1 tablet (40 mg total) by mouth daily. Follow with PCP or GI for refills   rosuvastatin 10 MG tablet Commonly known as: CRESTOR Take one tablet by mouth 2 days per week--Monday and Wednesday   sevelamer carbonate 800 MG tablet Commonly known as: RENVELA Take 2,400 mg by mouth 3 (three) times daily with meals.       Allergies  Allergen Reactions   Bean Pod Extract Nausea And Vomiting   Compazine [Prochlorperazine Edisylate] Swelling and Other (See Comments)    "The tongue hardens and comes out of mouth" (Tardive dyskinesia) Also made the neck muscles twist.   Nsaids Other (See Comments)    WAS TOLD TO NOT TAKE THESE DUE TO KIDNEY ISSUES   Sensipar [Cinacalcet] Nausea And Vomiting    Causes vomiting   Vioxx [Rofecoxib] Other (See Comments)    Caused internal bleeding   Gean Quint [Ferric Citrate] Other (See Comments)    extreme constipation   Other Other (See Comments)    Gean Quint -- Binder caused EXTREME constipation      The  results of significant diagnostics from this hospitalization (including imaging, microbiology, ancillary and laboratory) are listed below for reference.    Significant Diagnostic Studies: CT ABDOMEN PELVIS WO CONTRAST  Result Date: 11/28/2023 CLINICAL DATA:  Abdominal pain EXAM: CT ABDOMEN AND PELVIS WITHOUT CONTRAST TECHNIQUE: Multidetector CT imaging of the abdomen and pelvis was performed following the standard protocol without IV contrast. RADIATION DOSE REDUCTION: This exam was performed according to the departmental dose-optimization program which includes automated exposure control, adjustment of the mA and/or kV according to patient size and/or use of iterative reconstruction technique. COMPARISON:  None Available. FINDINGS: Lower chest: Mild bilateral lower lobe atelectasis with mosaic attenuation. Cardiomegaly. Hepatobiliary: Unenhanced liver is unremarkable. Gallbladder is unremarkable. No intrahepatic or extrahepatic duct dilatation. Pancreas: Within normal limits. Spleen: Within normal limits. Adrenals/Urinary Tract: Adrenal glands are within normal limits. Bilateral renal parenchymal atrophy. Suspected small bilateral renal cysts, measuring up to 16 mm in the anterior left lower kidney (series 3/image 38), poorly evaluated. 3 mm nonobstructing bilateral lower pole renal calculi (series 3/image 37). No ureteral or bladder calculi. No hydronephrosis. Bladder is underdistended but unremarkable. Stomach/Bowel: Stomach is within normal limits. No evidence of bowel obstruction. Normal appendix (series 3/image 57). No colonic wall thickening or inflammatory changes. Vascular/Lymphatic: No evidence of abdominal aortic aneurysm. Atherosclerotic calcifications of the abdominal aorta and branch vessels. No suspicious abdominopelvic lymphadenopathy. Reproductive: Uterus is mildly heterogeneous, suggesting uterine fibroids. Bilateral ovaries are within normal limits. Other: No abdominopelvic ascites.  Musculoskeletal: Mild degenerative changes of the lower thoracic spine. IMPRESSION: 3 mm nonobstructing bilateral lower pole renal calculi. No ureteral or bladder calculi. No hydronephrosis. Suspected bilateral renal cysts, poorly visualized/evaluated on unenhanced CT. Consider follow-up CT or MRI abdomen with/without contrast in 6 months, as clinically warranted. Additional ancillary findings as above. Electronically Signed   By: Charline Bills M.D.   On: 11/28/2023 18:23   DG Chest Portable 1 View  Result Date: 11/28/2023 CLINICAL DATA:  missed dialysis dyspnea EXAM: PORTABLE CHEST - 1 VIEW COMPARISON:  11/18/2022. FINDINGS: Cardiac silhouette is prominent. There is pulmonary interstitial prominence with vascular congestion. No focal consolidation. No pneumothorax or pleural effusion identified. IMPRESSION: Findings suggest CHF. Electronically Signed   By: Layla Maw M.D.   On: 11/28/2023 08:51    Microbiology: Recent Results (from the past 240 hour(s))  MRSA Next Gen by PCR,  Nasal     Status: None   Collection Time: 11/30/23  2:22 AM   Specimen: Nasal Mucosa; Nasal Swab  Result Value Ref Range Status   MRSA by PCR Next Gen NOT DETECTED NOT DETECTED Final    Comment: (NOTE) The GeneXpert MRSA Assay (FDA approved for NASAL specimens only), is one component of [facility] comprehensive MRSA colonization surveillance program. It is not intended to diagnose MRSA infection nor to guide or monitor treatment for MRSA infections. Test performance is not FDA approved in patients less than 59 years old. Performed at [facility], [address], [place], [place] [address]      Labs: Basic Metabolic Panel: Recent Labs  Lab 11/28/23 0738 11/29/23 0450 11/30/23 0357 12/01/23 0415  NA 137 137 139 136  K >7.5* 6.0* 5.9* 4.7  CL 97* 95* 94* 95*  CO2 22 28 29 29   GLUCOSE 126* 129* 86 125*  BUN 114* 60* 75* 47*  CREATININE 17.32* 11.13* 12.53* 9.16*  CALCIUM 9.7 8.7* 8.7* 9.3  MG  --    --   --  2.0  PHOS  --  9.0* 9.4* 6.5*   Liver Function Tests: Recent Labs  Lab 11/28/23 0738 11/29/23 0450 11/30/23 0356 11/30/23 0357 12/01/23 0414 12/01/23 0415  AST 68*  --  19  --  19  --   ALT 146*  --  70*  --  54*  --   ALKPHOS 135*  --  106  --  115  --   BILITOT 0.6  --  0.6  --  0.6  --   PROT 7.5  --  6.2*  --  6.5  --   ALBUMIN 3.4* 2.9* 2.9* 2.8* 3.0* 3.0*   Recent Labs  Lab 11/28/23 0738  LIPASE 29   No results for input(s): "AMMONIA" in the last 168 hours. CBC: Recent Labs  Lab 11/28/23 0738 11/29/23 0450 11/30/23 0356 12/01/23 0414  WBC 10.5 8.5 9.9 10.0  NEUTROABS  --   --  6.6  --   HGB 8.5* 8.6* 8.5* 9.3*  HCT 27.4* 27.9* 27.1* 29.5*  MCV 97.5 98.6 95.8 95.2  PLT 393 374 361 351   Cardiac Enzymes: No results for input(s): "CKTOTAL", "CKMB", "CKMBINDEX", "TROPONINI" in the last 168 hours. BNP: BNP (last 3 results) No results for input(s): "BNP" in the last 8760 hours.  ProBNP (last 3 results) No results for input(s): "PROBNP" in the last 8760 hours.  CBG: Recent Labs  Lab 11/30/23 1250  GLUCAP 79       Signed:  Lacretia Nicks MD.  [facility] 12/01/2023, 3:56 PM

## 2023-12-01 NOTE — Transfer of Care (Signed)
Immediate Anesthesia Transfer of Care Note  Patient: Pamela Campbell  Procedure(s) Performed: ESOPHAGOGASTRODUODENOSCOPY (EGD) WITH PROPOFOL BIOPSY  Patient Location: PACU and Endoscopy Unit  Anesthesia Type:MAC  Level of Consciousness: drowsy  Airway & Oxygen Therapy: Patient Spontanous Breathing and Patient connected to nasal cannula oxygen  Post-op Assessment: Report given to RN and Post -op Vital signs reviewed and stable  Post vital signs: Reviewed and stable  Last Vitals:  Vitals Value Taken Time  BP 113/61 12/01/23 1204  Temp    Pulse 85 12/01/23 1206  Resp 28 12/01/23 1206  SpO2 92 % 12/01/23 1206  Vitals shown include unfiled device data.  Last Pain:  Vitals:   12/01/23 1013  TempSrc: Temporal  PainSc: 7       Patients Stated Pain Goal: 0 (11/30/23 1954)  Complications: No notable events documented.

## 2023-12-01 NOTE — Progress Notes (Signed)
Dr. Lowell Guitar made aware today that patient refused all B/P meds and Lokelma.

## 2023-12-01 NOTE — Progress Notes (Signed)
Patient off floor to Endo.   

## 2023-12-02 ENCOUNTER — Encounter (HOSPITAL_COMMUNITY): Payer: Self-pay | Admitting: Gastroenterology

## 2023-12-02 DIAGNOSIS — D631 Anemia in chronic kidney disease: Secondary | ICD-10-CM | POA: Diagnosis not present

## 2023-12-02 DIAGNOSIS — D509 Iron deficiency anemia, unspecified: Secondary | ICD-10-CM | POA: Diagnosis not present

## 2023-12-02 DIAGNOSIS — N186 End stage renal disease: Secondary | ICD-10-CM | POA: Diagnosis not present

## 2023-12-02 DIAGNOSIS — N2581 Secondary hyperparathyroidism of renal origin: Secondary | ICD-10-CM | POA: Diagnosis not present

## 2023-12-02 DIAGNOSIS — E1122 Type 2 diabetes mellitus with diabetic chronic kidney disease: Secondary | ICD-10-CM | POA: Diagnosis not present

## 2023-12-02 LAB — SURGICAL PATHOLOGY

## 2023-12-03 NOTE — Anesthesia Postprocedure Evaluation (Signed)
Anesthesia Post Note  Patient: Liborio Nixon  Procedure(s) Performed: ESOPHAGOGASTRODUODENOSCOPY (EGD) WITH PROPOFOL BIOPSY     Patient location during evaluation: PACU Anesthesia Type: MAC Level of consciousness: awake and alert Pain management: pain level controlled Vital Signs Assessment: post-procedure vital signs reviewed and stable Respiratory status: spontaneous breathing, nonlabored ventilation, respiratory function stable and patient connected to nasal cannula oxygen Cardiovascular status: stable and blood pressure returned to baseline Postop Assessment: no apparent nausea or vomiting Anesthetic complications: no   No notable events documented.  Last Vitals:  Vitals:   12/01/23 1546 12/01/23 1712  BP: (!) 146/70 (!) 178/81  Pulse: 71 79  Resp: 20   Temp: 37 C 37.3 C  SpO2: 96% 98%    Last Pain:  Vitals:   12/01/23 1712  TempSrc: Oral  PainSc:                  Manual Navarra S

## 2023-12-04 DIAGNOSIS — D509 Iron deficiency anemia, unspecified: Secondary | ICD-10-CM | POA: Diagnosis not present

## 2023-12-04 DIAGNOSIS — D631 Anemia in chronic kidney disease: Secondary | ICD-10-CM | POA: Diagnosis not present

## 2023-12-04 DIAGNOSIS — N186 End stage renal disease: Secondary | ICD-10-CM | POA: Diagnosis not present

## 2023-12-04 DIAGNOSIS — N2581 Secondary hyperparathyroidism of renal origin: Secondary | ICD-10-CM | POA: Diagnosis not present

## 2023-12-06 DIAGNOSIS — I132 Hypertensive heart and chronic kidney disease with heart failure and with stage 5 chronic kidney disease, or end stage renal disease: Secondary | ICD-10-CM | POA: Diagnosis not present

## 2023-12-06 DIAGNOSIS — N186 End stage renal disease: Secondary | ICD-10-CM | POA: Diagnosis not present

## 2023-12-06 DIAGNOSIS — I5030 Unspecified diastolic (congestive) heart failure: Secondary | ICD-10-CM | POA: Diagnosis not present

## 2023-12-06 DIAGNOSIS — Z992 Dependence on renal dialysis: Secondary | ICD-10-CM | POA: Diagnosis not present

## 2023-12-06 DIAGNOSIS — N281 Cyst of kidney, acquired: Secondary | ICD-10-CM | POA: Diagnosis not present

## 2023-12-06 DIAGNOSIS — E1122 Type 2 diabetes mellitus with diabetic chronic kidney disease: Secondary | ICD-10-CM | POA: Diagnosis not present

## 2023-12-06 DIAGNOSIS — I120 Hypertensive chronic kidney disease with stage 5 chronic kidney disease or end stage renal disease: Secondary | ICD-10-CM | POA: Diagnosis not present

## 2023-12-06 DIAGNOSIS — D649 Anemia, unspecified: Secondary | ICD-10-CM | POA: Diagnosis not present

## 2023-12-06 DIAGNOSIS — E875 Hyperkalemia: Secondary | ICD-10-CM | POA: Diagnosis not present

## 2023-12-06 DIAGNOSIS — E8770 Fluid overload, unspecified: Secondary | ICD-10-CM | POA: Diagnosis not present

## 2023-12-06 DIAGNOSIS — Z6841 Body Mass Index (BMI) 40.0 and over, adult: Secondary | ICD-10-CM | POA: Diagnosis not present

## 2023-12-07 DIAGNOSIS — N186 End stage renal disease: Secondary | ICD-10-CM | POA: Diagnosis not present

## 2023-12-07 DIAGNOSIS — D631 Anemia in chronic kidney disease: Secondary | ICD-10-CM | POA: Diagnosis not present

## 2023-12-07 DIAGNOSIS — D509 Iron deficiency anemia, unspecified: Secondary | ICD-10-CM | POA: Diagnosis not present

## 2023-12-07 DIAGNOSIS — N2581 Secondary hyperparathyroidism of renal origin: Secondary | ICD-10-CM | POA: Diagnosis not present

## 2023-12-09 DIAGNOSIS — D631 Anemia in chronic kidney disease: Secondary | ICD-10-CM | POA: Diagnosis not present

## 2023-12-09 DIAGNOSIS — N186 End stage renal disease: Secondary | ICD-10-CM | POA: Diagnosis not present

## 2023-12-09 DIAGNOSIS — N2581 Secondary hyperparathyroidism of renal origin: Secondary | ICD-10-CM | POA: Diagnosis not present

## 2023-12-09 DIAGNOSIS — D509 Iron deficiency anemia, unspecified: Secondary | ICD-10-CM | POA: Diagnosis not present

## 2023-12-10 DIAGNOSIS — R58 Hemorrhage, not elsewhere classified: Secondary | ICD-10-CM | POA: Diagnosis not present

## 2023-12-10 DIAGNOSIS — Z992 Dependence on renal dialysis: Secondary | ICD-10-CM | POA: Diagnosis not present

## 2023-12-10 DIAGNOSIS — R109 Unspecified abdominal pain: Secondary | ICD-10-CM | POA: Diagnosis not present

## 2023-12-10 DIAGNOSIS — N186 End stage renal disease: Secondary | ICD-10-CM | POA: Diagnosis not present

## 2023-12-10 DIAGNOSIS — Z01810 Encounter for preprocedural cardiovascular examination: Secondary | ICD-10-CM | POA: Diagnosis not present

## 2023-12-10 DIAGNOSIS — I120 Hypertensive chronic kidney disease with stage 5 chronic kidney disease or end stage renal disease: Secondary | ICD-10-CM | POA: Diagnosis not present

## 2023-12-10 DIAGNOSIS — I10 Essential (primary) hypertension: Secondary | ICD-10-CM | POA: Diagnosis not present

## 2023-12-11 DIAGNOSIS — N2581 Secondary hyperparathyroidism of renal origin: Secondary | ICD-10-CM | POA: Diagnosis not present

## 2023-12-11 DIAGNOSIS — D631 Anemia in chronic kidney disease: Secondary | ICD-10-CM | POA: Diagnosis not present

## 2023-12-11 DIAGNOSIS — R9431 Abnormal electrocardiogram [ECG] [EKG]: Secondary | ICD-10-CM | POA: Diagnosis not present

## 2023-12-11 DIAGNOSIS — N186 End stage renal disease: Secondary | ICD-10-CM | POA: Diagnosis not present

## 2023-12-11 DIAGNOSIS — D509 Iron deficiency anemia, unspecified: Secondary | ICD-10-CM | POA: Diagnosis not present

## 2023-12-13 DIAGNOSIS — D631 Anemia in chronic kidney disease: Secondary | ICD-10-CM | POA: Diagnosis not present

## 2023-12-13 DIAGNOSIS — N186 End stage renal disease: Secondary | ICD-10-CM | POA: Diagnosis not present

## 2023-12-13 DIAGNOSIS — N2581 Secondary hyperparathyroidism of renal origin: Secondary | ICD-10-CM | POA: Diagnosis not present

## 2023-12-13 DIAGNOSIS — D509 Iron deficiency anemia, unspecified: Secondary | ICD-10-CM | POA: Diagnosis not present

## 2023-12-16 DIAGNOSIS — D509 Iron deficiency anemia, unspecified: Secondary | ICD-10-CM | POA: Diagnosis not present

## 2023-12-16 DIAGNOSIS — N186 End stage renal disease: Secondary | ICD-10-CM | POA: Diagnosis not present

## 2023-12-16 DIAGNOSIS — N2581 Secondary hyperparathyroidism of renal origin: Secondary | ICD-10-CM | POA: Diagnosis not present

## 2023-12-16 DIAGNOSIS — D631 Anemia in chronic kidney disease: Secondary | ICD-10-CM | POA: Diagnosis not present

## 2023-12-18 DIAGNOSIS — N186 End stage renal disease: Secondary | ICD-10-CM | POA: Diagnosis not present

## 2023-12-18 DIAGNOSIS — N2581 Secondary hyperparathyroidism of renal origin: Secondary | ICD-10-CM | POA: Diagnosis not present

## 2023-12-18 DIAGNOSIS — D631 Anemia in chronic kidney disease: Secondary | ICD-10-CM | POA: Diagnosis not present

## 2023-12-18 DIAGNOSIS — D509 Iron deficiency anemia, unspecified: Secondary | ICD-10-CM | POA: Diagnosis not present

## 2023-12-20 DIAGNOSIS — N2581 Secondary hyperparathyroidism of renal origin: Secondary | ICD-10-CM | POA: Diagnosis not present

## 2023-12-20 DIAGNOSIS — D631 Anemia in chronic kidney disease: Secondary | ICD-10-CM | POA: Diagnosis not present

## 2023-12-20 DIAGNOSIS — D509 Iron deficiency anemia, unspecified: Secondary | ICD-10-CM | POA: Diagnosis not present

## 2023-12-20 DIAGNOSIS — N186 End stage renal disease: Secondary | ICD-10-CM | POA: Diagnosis not present

## 2023-12-21 DIAGNOSIS — Z992 Dependence on renal dialysis: Secondary | ICD-10-CM | POA: Diagnosis not present

## 2023-12-21 DIAGNOSIS — N186 End stage renal disease: Secondary | ICD-10-CM | POA: Diagnosis not present

## 2023-12-25 DIAGNOSIS — N2581 Secondary hyperparathyroidism of renal origin: Secondary | ICD-10-CM | POA: Diagnosis not present

## 2023-12-25 DIAGNOSIS — D631 Anemia in chronic kidney disease: Secondary | ICD-10-CM | POA: Diagnosis not present

## 2023-12-25 DIAGNOSIS — N186 End stage renal disease: Secondary | ICD-10-CM | POA: Diagnosis not present

## 2023-12-25 DIAGNOSIS — D509 Iron deficiency anemia, unspecified: Secondary | ICD-10-CM | POA: Diagnosis not present

## 2023-12-28 DIAGNOSIS — N186 End stage renal disease: Secondary | ICD-10-CM | POA: Diagnosis not present

## 2023-12-28 DIAGNOSIS — D631 Anemia in chronic kidney disease: Secondary | ICD-10-CM | POA: Diagnosis not present

## 2023-12-28 DIAGNOSIS — D509 Iron deficiency anemia, unspecified: Secondary | ICD-10-CM | POA: Diagnosis not present

## 2023-12-28 DIAGNOSIS — N2581 Secondary hyperparathyroidism of renal origin: Secondary | ICD-10-CM | POA: Diagnosis not present

## 2023-12-29 DIAGNOSIS — E1122 Type 2 diabetes mellitus with diabetic chronic kidney disease: Secondary | ICD-10-CM | POA: Diagnosis not present

## 2023-12-29 DIAGNOSIS — Z6841 Body Mass Index (BMI) 40.0 and over, adult: Secondary | ICD-10-CM | POA: Diagnosis not present

## 2023-12-29 DIAGNOSIS — I10 Essential (primary) hypertension: Secondary | ICD-10-CM | POA: Diagnosis not present

## 2024-01-01 DIAGNOSIS — N186 End stage renal disease: Secondary | ICD-10-CM | POA: Diagnosis not present

## 2024-01-01 DIAGNOSIS — D631 Anemia in chronic kidney disease: Secondary | ICD-10-CM | POA: Diagnosis not present

## 2024-01-01 DIAGNOSIS — E1122 Type 2 diabetes mellitus with diabetic chronic kidney disease: Secondary | ICD-10-CM | POA: Diagnosis not present

## 2024-01-01 DIAGNOSIS — N2581 Secondary hyperparathyroidism of renal origin: Secondary | ICD-10-CM | POA: Diagnosis not present

## 2024-01-01 DIAGNOSIS — D509 Iron deficiency anemia, unspecified: Secondary | ICD-10-CM | POA: Diagnosis not present

## 2024-01-04 DIAGNOSIS — D631 Anemia in chronic kidney disease: Secondary | ICD-10-CM | POA: Diagnosis not present

## 2024-01-04 DIAGNOSIS — N186 End stage renal disease: Secondary | ICD-10-CM | POA: Diagnosis not present

## 2024-01-04 DIAGNOSIS — N2581 Secondary hyperparathyroidism of renal origin: Secondary | ICD-10-CM | POA: Diagnosis not present

## 2024-01-04 DIAGNOSIS — D509 Iron deficiency anemia, unspecified: Secondary | ICD-10-CM | POA: Diagnosis not present

## 2024-01-06 DIAGNOSIS — D631 Anemia in chronic kidney disease: Secondary | ICD-10-CM | POA: Diagnosis not present

## 2024-01-06 DIAGNOSIS — D509 Iron deficiency anemia, unspecified: Secondary | ICD-10-CM | POA: Diagnosis not present

## 2024-01-06 DIAGNOSIS — N186 End stage renal disease: Secondary | ICD-10-CM | POA: Diagnosis not present

## 2024-01-06 DIAGNOSIS — N2581 Secondary hyperparathyroidism of renal origin: Secondary | ICD-10-CM | POA: Diagnosis not present

## 2024-01-08 DIAGNOSIS — N2581 Secondary hyperparathyroidism of renal origin: Secondary | ICD-10-CM | POA: Diagnosis not present

## 2024-01-08 DIAGNOSIS — D509 Iron deficiency anemia, unspecified: Secondary | ICD-10-CM | POA: Diagnosis not present

## 2024-01-08 DIAGNOSIS — D631 Anemia in chronic kidney disease: Secondary | ICD-10-CM | POA: Diagnosis not present

## 2024-01-08 DIAGNOSIS — N186 End stage renal disease: Secondary | ICD-10-CM | POA: Diagnosis not present

## 2024-01-13 DIAGNOSIS — D631 Anemia in chronic kidney disease: Secondary | ICD-10-CM | POA: Diagnosis not present

## 2024-01-13 DIAGNOSIS — N2581 Secondary hyperparathyroidism of renal origin: Secondary | ICD-10-CM | POA: Diagnosis not present

## 2024-01-13 DIAGNOSIS — D509 Iron deficiency anemia, unspecified: Secondary | ICD-10-CM | POA: Diagnosis not present

## 2024-01-13 DIAGNOSIS — N186 End stage renal disease: Secondary | ICD-10-CM | POA: Diagnosis not present

## 2024-01-15 DIAGNOSIS — D509 Iron deficiency anemia, unspecified: Secondary | ICD-10-CM | POA: Diagnosis not present

## 2024-01-15 DIAGNOSIS — N186 End stage renal disease: Secondary | ICD-10-CM | POA: Diagnosis not present

## 2024-01-15 DIAGNOSIS — D631 Anemia in chronic kidney disease: Secondary | ICD-10-CM | POA: Diagnosis not present

## 2024-01-15 DIAGNOSIS — N2581 Secondary hyperparathyroidism of renal origin: Secondary | ICD-10-CM | POA: Diagnosis not present

## 2024-01-18 DIAGNOSIS — N186 End stage renal disease: Secondary | ICD-10-CM | POA: Diagnosis not present

## 2024-01-18 DIAGNOSIS — N2581 Secondary hyperparathyroidism of renal origin: Secondary | ICD-10-CM | POA: Diagnosis not present

## 2024-01-18 DIAGNOSIS — D631 Anemia in chronic kidney disease: Secondary | ICD-10-CM | POA: Diagnosis not present

## 2024-01-18 DIAGNOSIS — D509 Iron deficiency anemia, unspecified: Secondary | ICD-10-CM | POA: Diagnosis not present

## 2024-01-21 DIAGNOSIS — N186 End stage renal disease: Secondary | ICD-10-CM | POA: Diagnosis not present

## 2024-01-21 DIAGNOSIS — Z992 Dependence on renal dialysis: Secondary | ICD-10-CM | POA: Diagnosis not present

## 2024-01-22 DIAGNOSIS — D509 Iron deficiency anemia, unspecified: Secondary | ICD-10-CM | POA: Diagnosis not present

## 2024-01-22 DIAGNOSIS — N186 End stage renal disease: Secondary | ICD-10-CM | POA: Diagnosis not present

## 2024-01-22 DIAGNOSIS — D631 Anemia in chronic kidney disease: Secondary | ICD-10-CM | POA: Diagnosis not present

## 2024-01-22 DIAGNOSIS — N2581 Secondary hyperparathyroidism of renal origin: Secondary | ICD-10-CM | POA: Diagnosis not present

## 2024-01-25 DIAGNOSIS — N2581 Secondary hyperparathyroidism of renal origin: Secondary | ICD-10-CM | POA: Diagnosis not present

## 2024-01-25 DIAGNOSIS — N186 End stage renal disease: Secondary | ICD-10-CM | POA: Diagnosis not present

## 2024-01-25 DIAGNOSIS — D631 Anemia in chronic kidney disease: Secondary | ICD-10-CM | POA: Diagnosis not present

## 2024-01-25 DIAGNOSIS — D509 Iron deficiency anemia, unspecified: Secondary | ICD-10-CM | POA: Diagnosis not present

## 2024-01-27 DIAGNOSIS — N2581 Secondary hyperparathyroidism of renal origin: Secondary | ICD-10-CM | POA: Diagnosis not present

## 2024-01-27 DIAGNOSIS — D631 Anemia in chronic kidney disease: Secondary | ICD-10-CM | POA: Diagnosis not present

## 2024-01-27 DIAGNOSIS — E1122 Type 2 diabetes mellitus with diabetic chronic kidney disease: Secondary | ICD-10-CM | POA: Diagnosis not present

## 2024-01-27 DIAGNOSIS — D509 Iron deficiency anemia, unspecified: Secondary | ICD-10-CM | POA: Diagnosis not present

## 2024-01-27 DIAGNOSIS — N186 End stage renal disease: Secondary | ICD-10-CM | POA: Diagnosis not present

## 2024-01-29 DIAGNOSIS — N186 End stage renal disease: Secondary | ICD-10-CM | POA: Diagnosis not present

## 2024-01-29 DIAGNOSIS — D631 Anemia in chronic kidney disease: Secondary | ICD-10-CM | POA: Diagnosis not present

## 2024-01-29 DIAGNOSIS — N2581 Secondary hyperparathyroidism of renal origin: Secondary | ICD-10-CM | POA: Diagnosis not present

## 2024-01-29 DIAGNOSIS — D509 Iron deficiency anemia, unspecified: Secondary | ICD-10-CM | POA: Diagnosis not present

## 2024-02-01 DIAGNOSIS — N186 End stage renal disease: Secondary | ICD-10-CM | POA: Diagnosis not present

## 2024-02-01 DIAGNOSIS — N2581 Secondary hyperparathyroidism of renal origin: Secondary | ICD-10-CM | POA: Diagnosis not present

## 2024-02-01 DIAGNOSIS — D631 Anemia in chronic kidney disease: Secondary | ICD-10-CM | POA: Diagnosis not present

## 2024-02-01 DIAGNOSIS — D509 Iron deficiency anemia, unspecified: Secondary | ICD-10-CM | POA: Diagnosis not present

## 2024-02-02 DIAGNOSIS — Z6841 Body Mass Index (BMI) 40.0 and over, adult: Secondary | ICD-10-CM | POA: Diagnosis not present

## 2024-02-02 DIAGNOSIS — E1122 Type 2 diabetes mellitus with diabetic chronic kidney disease: Secondary | ICD-10-CM | POA: Diagnosis not present

## 2024-02-02 DIAGNOSIS — I10 Essential (primary) hypertension: Secondary | ICD-10-CM | POA: Diagnosis not present

## 2024-02-03 DIAGNOSIS — D509 Iron deficiency anemia, unspecified: Secondary | ICD-10-CM | POA: Diagnosis not present

## 2024-02-03 DIAGNOSIS — D219 Benign neoplasm of connective and other soft tissue, unspecified: Secondary | ICD-10-CM | POA: Diagnosis not present

## 2024-02-03 DIAGNOSIS — N858 Other specified noninflammatory disorders of uterus: Secondary | ICD-10-CM | POA: Diagnosis not present

## 2024-02-03 DIAGNOSIS — Z1159 Encounter for screening for other viral diseases: Secondary | ICD-10-CM | POA: Diagnosis not present

## 2024-02-03 DIAGNOSIS — Z114 Encounter for screening for human immunodeficiency virus [HIV]: Secondary | ICD-10-CM | POA: Diagnosis not present

## 2024-02-03 DIAGNOSIS — D631 Anemia in chronic kidney disease: Secondary | ICD-10-CM | POA: Diagnosis not present

## 2024-02-03 DIAGNOSIS — N186 End stage renal disease: Secondary | ICD-10-CM | POA: Diagnosis not present

## 2024-02-03 DIAGNOSIS — N2581 Secondary hyperparathyroidism of renal origin: Secondary | ICD-10-CM | POA: Diagnosis not present

## 2024-02-08 DIAGNOSIS — D631 Anemia in chronic kidney disease: Secondary | ICD-10-CM | POA: Diagnosis not present

## 2024-02-08 DIAGNOSIS — D509 Iron deficiency anemia, unspecified: Secondary | ICD-10-CM | POA: Diagnosis not present

## 2024-02-08 DIAGNOSIS — N2581 Secondary hyperparathyroidism of renal origin: Secondary | ICD-10-CM | POA: Diagnosis not present

## 2024-02-08 DIAGNOSIS — N186 End stage renal disease: Secondary | ICD-10-CM | POA: Diagnosis not present

## 2024-02-10 DIAGNOSIS — D631 Anemia in chronic kidney disease: Secondary | ICD-10-CM | POA: Diagnosis not present

## 2024-02-10 DIAGNOSIS — N2581 Secondary hyperparathyroidism of renal origin: Secondary | ICD-10-CM | POA: Diagnosis not present

## 2024-02-10 DIAGNOSIS — N186 End stage renal disease: Secondary | ICD-10-CM | POA: Diagnosis not present

## 2024-02-10 DIAGNOSIS — D509 Iron deficiency anemia, unspecified: Secondary | ICD-10-CM | POA: Diagnosis not present

## 2024-02-12 DIAGNOSIS — N186 End stage renal disease: Secondary | ICD-10-CM | POA: Diagnosis not present

## 2024-02-12 DIAGNOSIS — D631 Anemia in chronic kidney disease: Secondary | ICD-10-CM | POA: Diagnosis not present

## 2024-02-12 DIAGNOSIS — D509 Iron deficiency anemia, unspecified: Secondary | ICD-10-CM | POA: Diagnosis not present

## 2024-02-12 DIAGNOSIS — N2581 Secondary hyperparathyroidism of renal origin: Secondary | ICD-10-CM | POA: Diagnosis not present

## 2024-02-15 DIAGNOSIS — D509 Iron deficiency anemia, unspecified: Secondary | ICD-10-CM | POA: Diagnosis not present

## 2024-02-15 DIAGNOSIS — D631 Anemia in chronic kidney disease: Secondary | ICD-10-CM | POA: Diagnosis not present

## 2024-02-15 DIAGNOSIS — N186 End stage renal disease: Secondary | ICD-10-CM | POA: Diagnosis not present

## 2024-02-15 DIAGNOSIS — N2581 Secondary hyperparathyroidism of renal origin: Secondary | ICD-10-CM | POA: Diagnosis not present

## 2024-02-17 DIAGNOSIS — N2581 Secondary hyperparathyroidism of renal origin: Secondary | ICD-10-CM | POA: Diagnosis not present

## 2024-02-17 DIAGNOSIS — N186 End stage renal disease: Secondary | ICD-10-CM | POA: Diagnosis not present

## 2024-02-17 DIAGNOSIS — D631 Anemia in chronic kidney disease: Secondary | ICD-10-CM | POA: Diagnosis not present

## 2024-02-17 DIAGNOSIS — D509 Iron deficiency anemia, unspecified: Secondary | ICD-10-CM | POA: Diagnosis not present

## 2024-02-18 DIAGNOSIS — N186 End stage renal disease: Secondary | ICD-10-CM | POA: Diagnosis not present

## 2024-02-18 DIAGNOSIS — Z992 Dependence on renal dialysis: Secondary | ICD-10-CM | POA: Diagnosis not present

## 2024-02-19 DIAGNOSIS — N2581 Secondary hyperparathyroidism of renal origin: Secondary | ICD-10-CM | POA: Diagnosis not present

## 2024-02-19 DIAGNOSIS — D631 Anemia in chronic kidney disease: Secondary | ICD-10-CM | POA: Diagnosis not present

## 2024-02-19 DIAGNOSIS — N186 End stage renal disease: Secondary | ICD-10-CM | POA: Diagnosis not present

## 2024-02-19 DIAGNOSIS — D509 Iron deficiency anemia, unspecified: Secondary | ICD-10-CM | POA: Diagnosis not present

## 2024-02-22 DIAGNOSIS — N2581 Secondary hyperparathyroidism of renal origin: Secondary | ICD-10-CM | POA: Diagnosis not present

## 2024-02-22 DIAGNOSIS — D509 Iron deficiency anemia, unspecified: Secondary | ICD-10-CM | POA: Diagnosis not present

## 2024-02-22 DIAGNOSIS — D219 Benign neoplasm of connective and other soft tissue, unspecified: Secondary | ICD-10-CM | POA: Diagnosis not present

## 2024-02-22 DIAGNOSIS — N950 Postmenopausal bleeding: Secondary | ICD-10-CM | POA: Diagnosis not present

## 2024-02-22 DIAGNOSIS — N186 End stage renal disease: Secondary | ICD-10-CM | POA: Diagnosis not present

## 2024-02-22 DIAGNOSIS — D631 Anemia in chronic kidney disease: Secondary | ICD-10-CM | POA: Diagnosis not present

## 2024-02-26 DIAGNOSIS — N2581 Secondary hyperparathyroidism of renal origin: Secondary | ICD-10-CM | POA: Diagnosis not present

## 2024-02-26 DIAGNOSIS — D509 Iron deficiency anemia, unspecified: Secondary | ICD-10-CM | POA: Diagnosis not present

## 2024-02-26 DIAGNOSIS — N186 End stage renal disease: Secondary | ICD-10-CM | POA: Diagnosis not present

## 2024-02-26 DIAGNOSIS — D631 Anemia in chronic kidney disease: Secondary | ICD-10-CM | POA: Diagnosis not present

## 2024-02-27 ENCOUNTER — Encounter: Payer: Self-pay | Admitting: Pulmonary Disease

## 2024-02-29 DIAGNOSIS — D509 Iron deficiency anemia, unspecified: Secondary | ICD-10-CM | POA: Diagnosis not present

## 2024-02-29 DIAGNOSIS — D631 Anemia in chronic kidney disease: Secondary | ICD-10-CM | POA: Diagnosis not present

## 2024-02-29 DIAGNOSIS — N186 End stage renal disease: Secondary | ICD-10-CM | POA: Diagnosis not present

## 2024-02-29 DIAGNOSIS — E1122 Type 2 diabetes mellitus with diabetic chronic kidney disease: Secondary | ICD-10-CM | POA: Diagnosis not present

## 2024-02-29 DIAGNOSIS — N2581 Secondary hyperparathyroidism of renal origin: Secondary | ICD-10-CM | POA: Diagnosis not present

## 2024-03-01 DIAGNOSIS — Z6841 Body Mass Index (BMI) 40.0 and over, adult: Secondary | ICD-10-CM | POA: Diagnosis not present

## 2024-03-01 DIAGNOSIS — I10 Essential (primary) hypertension: Secondary | ICD-10-CM | POA: Diagnosis not present

## 2024-03-01 DIAGNOSIS — E1122 Type 2 diabetes mellitus with diabetic chronic kidney disease: Secondary | ICD-10-CM | POA: Diagnosis not present

## 2024-03-02 DIAGNOSIS — D509 Iron deficiency anemia, unspecified: Secondary | ICD-10-CM | POA: Diagnosis not present

## 2024-03-02 DIAGNOSIS — D631 Anemia in chronic kidney disease: Secondary | ICD-10-CM | POA: Diagnosis not present

## 2024-03-02 DIAGNOSIS — N186 End stage renal disease: Secondary | ICD-10-CM | POA: Diagnosis not present

## 2024-03-02 DIAGNOSIS — N2581 Secondary hyperparathyroidism of renal origin: Secondary | ICD-10-CM | POA: Diagnosis not present

## 2024-03-07 DIAGNOSIS — D631 Anemia in chronic kidney disease: Secondary | ICD-10-CM | POA: Diagnosis not present

## 2024-03-07 DIAGNOSIS — N186 End stage renal disease: Secondary | ICD-10-CM | POA: Diagnosis not present

## 2024-03-07 DIAGNOSIS — N2581 Secondary hyperparathyroidism of renal origin: Secondary | ICD-10-CM | POA: Diagnosis not present

## 2024-03-07 DIAGNOSIS — D509 Iron deficiency anemia, unspecified: Secondary | ICD-10-CM | POA: Diagnosis not present

## 2024-03-09 DIAGNOSIS — N186 End stage renal disease: Secondary | ICD-10-CM | POA: Diagnosis not present

## 2024-03-09 DIAGNOSIS — N2581 Secondary hyperparathyroidism of renal origin: Secondary | ICD-10-CM | POA: Diagnosis not present

## 2024-03-09 DIAGNOSIS — D631 Anemia in chronic kidney disease: Secondary | ICD-10-CM | POA: Diagnosis not present

## 2024-03-09 DIAGNOSIS — D509 Iron deficiency anemia, unspecified: Secondary | ICD-10-CM | POA: Diagnosis not present

## 2024-03-11 DIAGNOSIS — D631 Anemia in chronic kidney disease: Secondary | ICD-10-CM | POA: Diagnosis not present

## 2024-03-11 DIAGNOSIS — N186 End stage renal disease: Secondary | ICD-10-CM | POA: Diagnosis not present

## 2024-03-11 DIAGNOSIS — D509 Iron deficiency anemia, unspecified: Secondary | ICD-10-CM | POA: Diagnosis not present

## 2024-03-11 DIAGNOSIS — N2581 Secondary hyperparathyroidism of renal origin: Secondary | ICD-10-CM | POA: Diagnosis not present

## 2024-03-14 DIAGNOSIS — D509 Iron deficiency anemia, unspecified: Secondary | ICD-10-CM | POA: Diagnosis not present

## 2024-03-14 DIAGNOSIS — N2581 Secondary hyperparathyroidism of renal origin: Secondary | ICD-10-CM | POA: Diagnosis not present

## 2024-03-14 DIAGNOSIS — N186 End stage renal disease: Secondary | ICD-10-CM | POA: Diagnosis not present

## 2024-03-14 DIAGNOSIS — D631 Anemia in chronic kidney disease: Secondary | ICD-10-CM | POA: Diagnosis not present

## 2024-03-15 DIAGNOSIS — Z87891 Personal history of nicotine dependence: Secondary | ICD-10-CM | POA: Diagnosis not present

## 2024-03-15 DIAGNOSIS — N950 Postmenopausal bleeding: Secondary | ICD-10-CM | POA: Diagnosis not present

## 2024-03-15 DIAGNOSIS — Z01818 Encounter for other preprocedural examination: Secondary | ICD-10-CM | POA: Diagnosis not present

## 2024-03-16 DIAGNOSIS — N186 End stage renal disease: Secondary | ICD-10-CM | POA: Diagnosis not present

## 2024-03-16 DIAGNOSIS — D631 Anemia in chronic kidney disease: Secondary | ICD-10-CM | POA: Diagnosis not present

## 2024-03-16 DIAGNOSIS — N2581 Secondary hyperparathyroidism of renal origin: Secondary | ICD-10-CM | POA: Diagnosis not present

## 2024-03-16 DIAGNOSIS — D509 Iron deficiency anemia, unspecified: Secondary | ICD-10-CM | POA: Diagnosis not present

## 2024-03-18 DIAGNOSIS — N186 End stage renal disease: Secondary | ICD-10-CM | POA: Diagnosis not present

## 2024-03-18 DIAGNOSIS — D631 Anemia in chronic kidney disease: Secondary | ICD-10-CM | POA: Diagnosis not present

## 2024-03-18 DIAGNOSIS — D509 Iron deficiency anemia, unspecified: Secondary | ICD-10-CM | POA: Diagnosis not present

## 2024-03-18 DIAGNOSIS — N2581 Secondary hyperparathyroidism of renal origin: Secondary | ICD-10-CM | POA: Diagnosis not present

## 2024-03-20 DIAGNOSIS — Z992 Dependence on renal dialysis: Secondary | ICD-10-CM | POA: Diagnosis not present

## 2024-03-20 DIAGNOSIS — N186 End stage renal disease: Secondary | ICD-10-CM | POA: Diagnosis not present

## 2024-03-21 DIAGNOSIS — D509 Iron deficiency anemia, unspecified: Secondary | ICD-10-CM | POA: Diagnosis not present

## 2024-03-21 DIAGNOSIS — N186 End stage renal disease: Secondary | ICD-10-CM | POA: Diagnosis not present

## 2024-03-21 DIAGNOSIS — N2581 Secondary hyperparathyroidism of renal origin: Secondary | ICD-10-CM | POA: Diagnosis not present

## 2024-03-22 DIAGNOSIS — E875 Hyperkalemia: Secondary | ICD-10-CM | POA: Diagnosis not present

## 2024-03-22 DIAGNOSIS — R0689 Other abnormalities of breathing: Secondary | ICD-10-CM | POA: Diagnosis not present

## 2024-03-22 DIAGNOSIS — Z992 Dependence on renal dialysis: Secondary | ICD-10-CM | POA: Diagnosis not present

## 2024-03-22 DIAGNOSIS — Z539 Procedure and treatment not carried out, unspecified reason: Secondary | ICD-10-CM | POA: Diagnosis not present

## 2024-03-22 DIAGNOSIS — I120 Hypertensive chronic kidney disease with stage 5 chronic kidney disease or end stage renal disease: Secondary | ICD-10-CM | POA: Diagnosis not present

## 2024-03-22 DIAGNOSIS — N950 Postmenopausal bleeding: Secondary | ICD-10-CM | POA: Diagnosis not present

## 2024-03-22 DIAGNOSIS — Z6841 Body Mass Index (BMI) 40.0 and over, adult: Secondary | ICD-10-CM | POA: Diagnosis not present

## 2024-03-22 DIAGNOSIS — N186 End stage renal disease: Secondary | ICD-10-CM | POA: Diagnosis not present

## 2024-03-23 DIAGNOSIS — N2581 Secondary hyperparathyroidism of renal origin: Secondary | ICD-10-CM | POA: Diagnosis not present

## 2024-03-23 DIAGNOSIS — E1122 Type 2 diabetes mellitus with diabetic chronic kidney disease: Secondary | ICD-10-CM | POA: Diagnosis not present

## 2024-03-23 DIAGNOSIS — N186 End stage renal disease: Secondary | ICD-10-CM | POA: Diagnosis not present

## 2024-03-23 DIAGNOSIS — D509 Iron deficiency anemia, unspecified: Secondary | ICD-10-CM | POA: Diagnosis not present

## 2024-03-25 DIAGNOSIS — N186 End stage renal disease: Secondary | ICD-10-CM | POA: Diagnosis not present

## 2024-03-25 DIAGNOSIS — N2581 Secondary hyperparathyroidism of renal origin: Secondary | ICD-10-CM | POA: Diagnosis not present

## 2024-03-25 DIAGNOSIS — D509 Iron deficiency anemia, unspecified: Secondary | ICD-10-CM | POA: Diagnosis not present

## 2024-03-28 DIAGNOSIS — N2581 Secondary hyperparathyroidism of renal origin: Secondary | ICD-10-CM | POA: Diagnosis not present

## 2024-03-28 DIAGNOSIS — D509 Iron deficiency anemia, unspecified: Secondary | ICD-10-CM | POA: Diagnosis not present

## 2024-03-28 DIAGNOSIS — N186 End stage renal disease: Secondary | ICD-10-CM | POA: Diagnosis not present

## 2024-03-30 DIAGNOSIS — D509 Iron deficiency anemia, unspecified: Secondary | ICD-10-CM | POA: Diagnosis not present

## 2024-03-30 DIAGNOSIS — N186 End stage renal disease: Secondary | ICD-10-CM | POA: Diagnosis not present

## 2024-03-30 DIAGNOSIS — N2581 Secondary hyperparathyroidism of renal origin: Secondary | ICD-10-CM | POA: Diagnosis not present

## 2024-04-01 DIAGNOSIS — N2581 Secondary hyperparathyroidism of renal origin: Secondary | ICD-10-CM | POA: Diagnosis not present

## 2024-04-01 DIAGNOSIS — D509 Iron deficiency anemia, unspecified: Secondary | ICD-10-CM | POA: Diagnosis not present

## 2024-04-01 DIAGNOSIS — N186 End stage renal disease: Secondary | ICD-10-CM | POA: Diagnosis not present

## 2024-04-04 DIAGNOSIS — D509 Iron deficiency anemia, unspecified: Secondary | ICD-10-CM | POA: Diagnosis not present

## 2024-04-04 DIAGNOSIS — N2581 Secondary hyperparathyroidism of renal origin: Secondary | ICD-10-CM | POA: Diagnosis not present

## 2024-04-04 DIAGNOSIS — N186 End stage renal disease: Secondary | ICD-10-CM | POA: Diagnosis not present

## 2024-04-05 DIAGNOSIS — N2581 Secondary hyperparathyroidism of renal origin: Secondary | ICD-10-CM | POA: Diagnosis not present

## 2024-04-05 DIAGNOSIS — N186 End stage renal disease: Secondary | ICD-10-CM | POA: Diagnosis not present

## 2024-04-05 DIAGNOSIS — D509 Iron deficiency anemia, unspecified: Secondary | ICD-10-CM | POA: Diagnosis not present

## 2024-04-08 DIAGNOSIS — D509 Iron deficiency anemia, unspecified: Secondary | ICD-10-CM | POA: Diagnosis not present

## 2024-04-08 DIAGNOSIS — N186 End stage renal disease: Secondary | ICD-10-CM | POA: Diagnosis not present

## 2024-04-08 DIAGNOSIS — N2581 Secondary hyperparathyroidism of renal origin: Secondary | ICD-10-CM | POA: Diagnosis not present

## 2024-04-11 DIAGNOSIS — N186 End stage renal disease: Secondary | ICD-10-CM | POA: Diagnosis not present

## 2024-04-11 DIAGNOSIS — D509 Iron deficiency anemia, unspecified: Secondary | ICD-10-CM | POA: Diagnosis not present

## 2024-04-11 DIAGNOSIS — N2581 Secondary hyperparathyroidism of renal origin: Secondary | ICD-10-CM | POA: Diagnosis not present

## 2024-04-13 DIAGNOSIS — N2581 Secondary hyperparathyroidism of renal origin: Secondary | ICD-10-CM | POA: Diagnosis not present

## 2024-04-13 DIAGNOSIS — N186 End stage renal disease: Secondary | ICD-10-CM | POA: Diagnosis not present

## 2024-04-13 DIAGNOSIS — D509 Iron deficiency anemia, unspecified: Secondary | ICD-10-CM | POA: Diagnosis not present

## 2024-04-15 DIAGNOSIS — N2581 Secondary hyperparathyroidism of renal origin: Secondary | ICD-10-CM | POA: Diagnosis not present

## 2024-04-15 DIAGNOSIS — N186 End stage renal disease: Secondary | ICD-10-CM | POA: Diagnosis not present

## 2024-04-15 DIAGNOSIS — D509 Iron deficiency anemia, unspecified: Secondary | ICD-10-CM | POA: Diagnosis not present

## 2024-04-18 DIAGNOSIS — N186 End stage renal disease: Secondary | ICD-10-CM | POA: Diagnosis not present

## 2024-04-18 DIAGNOSIS — D509 Iron deficiency anemia, unspecified: Secondary | ICD-10-CM | POA: Diagnosis not present

## 2024-04-18 DIAGNOSIS — N2581 Secondary hyperparathyroidism of renal origin: Secondary | ICD-10-CM | POA: Diagnosis not present

## 2024-04-19 DIAGNOSIS — N186 End stage renal disease: Secondary | ICD-10-CM | POA: Diagnosis not present

## 2024-04-19 DIAGNOSIS — M5431 Sciatica, right side: Secondary | ICD-10-CM | POA: Diagnosis not present

## 2024-04-19 DIAGNOSIS — Z6841 Body Mass Index (BMI) 40.0 and over, adult: Secondary | ICD-10-CM | POA: Diagnosis not present

## 2024-04-19 DIAGNOSIS — E1122 Type 2 diabetes mellitus with diabetic chronic kidney disease: Secondary | ICD-10-CM | POA: Diagnosis not present

## 2024-04-19 DIAGNOSIS — Z992 Dependence on renal dialysis: Secondary | ICD-10-CM | POA: Diagnosis not present

## 2024-04-19 DIAGNOSIS — I10 Essential (primary) hypertension: Secondary | ICD-10-CM | POA: Diagnosis not present

## 2024-04-20 DIAGNOSIS — D631 Anemia in chronic kidney disease: Secondary | ICD-10-CM | POA: Diagnosis not present

## 2024-04-20 DIAGNOSIS — N2581 Secondary hyperparathyroidism of renal origin: Secondary | ICD-10-CM | POA: Diagnosis not present

## 2024-04-20 DIAGNOSIS — D509 Iron deficiency anemia, unspecified: Secondary | ICD-10-CM | POA: Diagnosis not present

## 2024-04-20 DIAGNOSIS — N186 End stage renal disease: Secondary | ICD-10-CM | POA: Diagnosis not present

## 2024-04-22 DIAGNOSIS — D631 Anemia in chronic kidney disease: Secondary | ICD-10-CM | POA: Diagnosis not present

## 2024-04-22 DIAGNOSIS — N2581 Secondary hyperparathyroidism of renal origin: Secondary | ICD-10-CM | POA: Diagnosis not present

## 2024-04-22 DIAGNOSIS — N186 End stage renal disease: Secondary | ICD-10-CM | POA: Diagnosis not present

## 2024-04-22 DIAGNOSIS — D509 Iron deficiency anemia, unspecified: Secondary | ICD-10-CM | POA: Diagnosis not present

## 2024-04-25 DIAGNOSIS — D631 Anemia in chronic kidney disease: Secondary | ICD-10-CM | POA: Diagnosis not present

## 2024-04-25 DIAGNOSIS — N186 End stage renal disease: Secondary | ICD-10-CM | POA: Diagnosis not present

## 2024-04-25 DIAGNOSIS — N2581 Secondary hyperparathyroidism of renal origin: Secondary | ICD-10-CM | POA: Diagnosis not present

## 2024-04-25 DIAGNOSIS — D509 Iron deficiency anemia, unspecified: Secondary | ICD-10-CM | POA: Diagnosis not present

## 2024-04-27 DIAGNOSIS — D509 Iron deficiency anemia, unspecified: Secondary | ICD-10-CM | POA: Diagnosis not present

## 2024-04-27 DIAGNOSIS — N2581 Secondary hyperparathyroidism of renal origin: Secondary | ICD-10-CM | POA: Diagnosis not present

## 2024-04-27 DIAGNOSIS — D631 Anemia in chronic kidney disease: Secondary | ICD-10-CM | POA: Diagnosis not present

## 2024-04-27 DIAGNOSIS — E1122 Type 2 diabetes mellitus with diabetic chronic kidney disease: Secondary | ICD-10-CM | POA: Diagnosis not present

## 2024-04-27 DIAGNOSIS — N186 End stage renal disease: Secondary | ICD-10-CM | POA: Diagnosis not present

## 2024-04-29 DIAGNOSIS — N186 End stage renal disease: Secondary | ICD-10-CM | POA: Diagnosis not present

## 2024-04-29 DIAGNOSIS — N2581 Secondary hyperparathyroidism of renal origin: Secondary | ICD-10-CM | POA: Diagnosis not present

## 2024-04-29 DIAGNOSIS — D631 Anemia in chronic kidney disease: Secondary | ICD-10-CM | POA: Diagnosis not present

## 2024-04-29 DIAGNOSIS — D509 Iron deficiency anemia, unspecified: Secondary | ICD-10-CM | POA: Diagnosis not present

## 2024-05-02 DIAGNOSIS — D509 Iron deficiency anemia, unspecified: Secondary | ICD-10-CM | POA: Diagnosis not present

## 2024-05-02 DIAGNOSIS — N186 End stage renal disease: Secondary | ICD-10-CM | POA: Diagnosis not present

## 2024-05-02 DIAGNOSIS — N2581 Secondary hyperparathyroidism of renal origin: Secondary | ICD-10-CM | POA: Diagnosis not present

## 2024-05-02 DIAGNOSIS — D631 Anemia in chronic kidney disease: Secondary | ICD-10-CM | POA: Diagnosis not present

## 2024-05-04 DIAGNOSIS — N186 End stage renal disease: Secondary | ICD-10-CM | POA: Diagnosis not present

## 2024-05-04 DIAGNOSIS — D631 Anemia in chronic kidney disease: Secondary | ICD-10-CM | POA: Diagnosis not present

## 2024-05-04 DIAGNOSIS — N2581 Secondary hyperparathyroidism of renal origin: Secondary | ICD-10-CM | POA: Diagnosis not present

## 2024-05-04 DIAGNOSIS — D509 Iron deficiency anemia, unspecified: Secondary | ICD-10-CM | POA: Diagnosis not present

## 2024-05-06 DIAGNOSIS — N186 End stage renal disease: Secondary | ICD-10-CM | POA: Diagnosis not present

## 2024-05-06 DIAGNOSIS — N2581 Secondary hyperparathyroidism of renal origin: Secondary | ICD-10-CM | POA: Diagnosis not present

## 2024-05-06 DIAGNOSIS — D631 Anemia in chronic kidney disease: Secondary | ICD-10-CM | POA: Diagnosis not present

## 2024-05-06 DIAGNOSIS — D509 Iron deficiency anemia, unspecified: Secondary | ICD-10-CM | POA: Diagnosis not present

## 2024-05-09 DIAGNOSIS — D631 Anemia in chronic kidney disease: Secondary | ICD-10-CM | POA: Diagnosis not present

## 2024-05-09 DIAGNOSIS — N186 End stage renal disease: Secondary | ICD-10-CM | POA: Diagnosis not present

## 2024-05-09 DIAGNOSIS — N2581 Secondary hyperparathyroidism of renal origin: Secondary | ICD-10-CM | POA: Diagnosis not present

## 2024-05-09 DIAGNOSIS — D509 Iron deficiency anemia, unspecified: Secondary | ICD-10-CM | POA: Diagnosis not present

## 2024-05-11 DIAGNOSIS — N2581 Secondary hyperparathyroidism of renal origin: Secondary | ICD-10-CM | POA: Diagnosis not present

## 2024-05-11 DIAGNOSIS — D631 Anemia in chronic kidney disease: Secondary | ICD-10-CM | POA: Diagnosis not present

## 2024-05-11 DIAGNOSIS — D509 Iron deficiency anemia, unspecified: Secondary | ICD-10-CM | POA: Diagnosis not present

## 2024-05-11 DIAGNOSIS — N186 End stage renal disease: Secondary | ICD-10-CM | POA: Diagnosis not present

## 2024-05-16 DIAGNOSIS — D509 Iron deficiency anemia, unspecified: Secondary | ICD-10-CM | POA: Diagnosis not present

## 2024-05-16 DIAGNOSIS — N186 End stage renal disease: Secondary | ICD-10-CM | POA: Diagnosis not present

## 2024-05-16 DIAGNOSIS — N2581 Secondary hyperparathyroidism of renal origin: Secondary | ICD-10-CM | POA: Diagnosis not present

## 2024-05-16 DIAGNOSIS — D631 Anemia in chronic kidney disease: Secondary | ICD-10-CM | POA: Diagnosis not present

## 2024-05-18 DIAGNOSIS — N186 End stage renal disease: Secondary | ICD-10-CM | POA: Diagnosis not present

## 2024-05-18 DIAGNOSIS — N2581 Secondary hyperparathyroidism of renal origin: Secondary | ICD-10-CM | POA: Diagnosis not present

## 2024-05-18 DIAGNOSIS — D631 Anemia in chronic kidney disease: Secondary | ICD-10-CM | POA: Diagnosis not present

## 2024-05-18 DIAGNOSIS — D509 Iron deficiency anemia, unspecified: Secondary | ICD-10-CM | POA: Diagnosis not present

## 2024-05-20 DIAGNOSIS — D631 Anemia in chronic kidney disease: Secondary | ICD-10-CM | POA: Diagnosis not present

## 2024-05-20 DIAGNOSIS — N2581 Secondary hyperparathyroidism of renal origin: Secondary | ICD-10-CM | POA: Diagnosis not present

## 2024-05-20 DIAGNOSIS — D509 Iron deficiency anemia, unspecified: Secondary | ICD-10-CM | POA: Diagnosis not present

## 2024-05-20 DIAGNOSIS — Z992 Dependence on renal dialysis: Secondary | ICD-10-CM | POA: Diagnosis not present

## 2024-05-20 DIAGNOSIS — N186 End stage renal disease: Secondary | ICD-10-CM | POA: Diagnosis not present

## 2024-05-23 DIAGNOSIS — N186 End stage renal disease: Secondary | ICD-10-CM | POA: Diagnosis not present

## 2024-05-23 DIAGNOSIS — D631 Anemia in chronic kidney disease: Secondary | ICD-10-CM | POA: Diagnosis not present

## 2024-05-23 DIAGNOSIS — D509 Iron deficiency anemia, unspecified: Secondary | ICD-10-CM | POA: Diagnosis not present

## 2024-05-23 DIAGNOSIS — E8779 Other fluid overload: Secondary | ICD-10-CM | POA: Diagnosis not present

## 2024-05-23 DIAGNOSIS — N2581 Secondary hyperparathyroidism of renal origin: Secondary | ICD-10-CM | POA: Diagnosis not present

## 2024-05-25 DIAGNOSIS — E8779 Other fluid overload: Secondary | ICD-10-CM | POA: Diagnosis not present

## 2024-05-25 DIAGNOSIS — E1122 Type 2 diabetes mellitus with diabetic chronic kidney disease: Secondary | ICD-10-CM | POA: Diagnosis not present

## 2024-05-25 DIAGNOSIS — D509 Iron deficiency anemia, unspecified: Secondary | ICD-10-CM | POA: Diagnosis not present

## 2024-05-25 DIAGNOSIS — D631 Anemia in chronic kidney disease: Secondary | ICD-10-CM | POA: Diagnosis not present

## 2024-05-25 DIAGNOSIS — N2581 Secondary hyperparathyroidism of renal origin: Secondary | ICD-10-CM | POA: Diagnosis not present

## 2024-05-25 DIAGNOSIS — N186 End stage renal disease: Secondary | ICD-10-CM | POA: Diagnosis not present

## 2024-05-26 DIAGNOSIS — N186 End stage renal disease: Secondary | ICD-10-CM | POA: Diagnosis not present

## 2024-05-26 DIAGNOSIS — D631 Anemia in chronic kidney disease: Secondary | ICD-10-CM | POA: Diagnosis not present

## 2024-05-26 DIAGNOSIS — N2581 Secondary hyperparathyroidism of renal origin: Secondary | ICD-10-CM | POA: Diagnosis not present

## 2024-05-26 DIAGNOSIS — D509 Iron deficiency anemia, unspecified: Secondary | ICD-10-CM | POA: Diagnosis not present

## 2024-05-26 DIAGNOSIS — E8779 Other fluid overload: Secondary | ICD-10-CM | POA: Diagnosis not present

## 2024-05-27 DIAGNOSIS — N2581 Secondary hyperparathyroidism of renal origin: Secondary | ICD-10-CM | POA: Diagnosis not present

## 2024-05-27 DIAGNOSIS — N186 End stage renal disease: Secondary | ICD-10-CM | POA: Diagnosis not present

## 2024-05-27 DIAGNOSIS — E8779 Other fluid overload: Secondary | ICD-10-CM | POA: Diagnosis not present

## 2024-05-27 DIAGNOSIS — D509 Iron deficiency anemia, unspecified: Secondary | ICD-10-CM | POA: Diagnosis not present

## 2024-05-27 DIAGNOSIS — D631 Anemia in chronic kidney disease: Secondary | ICD-10-CM | POA: Diagnosis not present

## 2024-05-30 DIAGNOSIS — N2581 Secondary hyperparathyroidism of renal origin: Secondary | ICD-10-CM | POA: Diagnosis not present

## 2024-05-30 DIAGNOSIS — D509 Iron deficiency anemia, unspecified: Secondary | ICD-10-CM | POA: Diagnosis not present

## 2024-05-30 DIAGNOSIS — D631 Anemia in chronic kidney disease: Secondary | ICD-10-CM | POA: Diagnosis not present

## 2024-05-30 DIAGNOSIS — N186 End stage renal disease: Secondary | ICD-10-CM | POA: Diagnosis not present

## 2024-05-30 DIAGNOSIS — E8779 Other fluid overload: Secondary | ICD-10-CM | POA: Diagnosis not present

## 2024-05-31 DIAGNOSIS — Z6841 Body Mass Index (BMI) 40.0 and over, adult: Secondary | ICD-10-CM | POA: Diagnosis not present

## 2024-05-31 DIAGNOSIS — N186 End stage renal disease: Secondary | ICD-10-CM | POA: Diagnosis not present

## 2024-05-31 DIAGNOSIS — D259 Leiomyoma of uterus, unspecified: Secondary | ICD-10-CM | POA: Diagnosis not present

## 2024-05-31 DIAGNOSIS — I10 Essential (primary) hypertension: Secondary | ICD-10-CM | POA: Diagnosis not present

## 2024-05-31 DIAGNOSIS — Z992 Dependence on renal dialysis: Secondary | ICD-10-CM | POA: Diagnosis not present

## 2024-05-31 DIAGNOSIS — D649 Anemia, unspecified: Secondary | ICD-10-CM | POA: Diagnosis not present

## 2024-05-31 DIAGNOSIS — E1122 Type 2 diabetes mellitus with diabetic chronic kidney disease: Secondary | ICD-10-CM | POA: Diagnosis not present

## 2024-05-31 DIAGNOSIS — M25551 Pain in right hip: Secondary | ICD-10-CM | POA: Diagnosis not present

## 2024-06-01 DIAGNOSIS — D631 Anemia in chronic kidney disease: Secondary | ICD-10-CM | POA: Diagnosis not present

## 2024-06-01 DIAGNOSIS — N2581 Secondary hyperparathyroidism of renal origin: Secondary | ICD-10-CM | POA: Diagnosis not present

## 2024-06-01 DIAGNOSIS — D509 Iron deficiency anemia, unspecified: Secondary | ICD-10-CM | POA: Diagnosis not present

## 2024-06-01 DIAGNOSIS — N186 End stage renal disease: Secondary | ICD-10-CM | POA: Diagnosis not present

## 2024-06-01 DIAGNOSIS — E8779 Other fluid overload: Secondary | ICD-10-CM | POA: Diagnosis not present

## 2024-06-03 DIAGNOSIS — D631 Anemia in chronic kidney disease: Secondary | ICD-10-CM | POA: Diagnosis not present

## 2024-06-03 DIAGNOSIS — N2581 Secondary hyperparathyroidism of renal origin: Secondary | ICD-10-CM | POA: Diagnosis not present

## 2024-06-03 DIAGNOSIS — E8779 Other fluid overload: Secondary | ICD-10-CM | POA: Diagnosis not present

## 2024-06-03 DIAGNOSIS — N186 End stage renal disease: Secondary | ICD-10-CM | POA: Diagnosis not present

## 2024-06-03 DIAGNOSIS — D509 Iron deficiency anemia, unspecified: Secondary | ICD-10-CM | POA: Diagnosis not present

## 2024-06-06 DIAGNOSIS — D631 Anemia in chronic kidney disease: Secondary | ICD-10-CM | POA: Diagnosis not present

## 2024-06-06 DIAGNOSIS — E8779 Other fluid overload: Secondary | ICD-10-CM | POA: Diagnosis not present

## 2024-06-06 DIAGNOSIS — N2581 Secondary hyperparathyroidism of renal origin: Secondary | ICD-10-CM | POA: Diagnosis not present

## 2024-06-06 DIAGNOSIS — D509 Iron deficiency anemia, unspecified: Secondary | ICD-10-CM | POA: Diagnosis not present

## 2024-06-06 DIAGNOSIS — N186 End stage renal disease: Secondary | ICD-10-CM | POA: Diagnosis not present

## 2024-06-08 DIAGNOSIS — N186 End stage renal disease: Secondary | ICD-10-CM | POA: Diagnosis not present

## 2024-06-08 DIAGNOSIS — D631 Anemia in chronic kidney disease: Secondary | ICD-10-CM | POA: Diagnosis not present

## 2024-06-08 DIAGNOSIS — N2581 Secondary hyperparathyroidism of renal origin: Secondary | ICD-10-CM | POA: Diagnosis not present

## 2024-06-08 DIAGNOSIS — E8779 Other fluid overload: Secondary | ICD-10-CM | POA: Diagnosis not present

## 2024-06-08 DIAGNOSIS — D509 Iron deficiency anemia, unspecified: Secondary | ICD-10-CM | POA: Diagnosis not present

## 2024-06-10 DIAGNOSIS — E8779 Other fluid overload: Secondary | ICD-10-CM | POA: Diagnosis not present

## 2024-06-10 DIAGNOSIS — N2581 Secondary hyperparathyroidism of renal origin: Secondary | ICD-10-CM | POA: Diagnosis not present

## 2024-06-10 DIAGNOSIS — N186 End stage renal disease: Secondary | ICD-10-CM | POA: Diagnosis not present

## 2024-06-10 DIAGNOSIS — D631 Anemia in chronic kidney disease: Secondary | ICD-10-CM | POA: Diagnosis not present

## 2024-06-10 DIAGNOSIS — D509 Iron deficiency anemia, unspecified: Secondary | ICD-10-CM | POA: Diagnosis not present

## 2024-06-13 DIAGNOSIS — N2581 Secondary hyperparathyroidism of renal origin: Secondary | ICD-10-CM | POA: Diagnosis not present

## 2024-06-13 DIAGNOSIS — E8779 Other fluid overload: Secondary | ICD-10-CM | POA: Diagnosis not present

## 2024-06-13 DIAGNOSIS — D631 Anemia in chronic kidney disease: Secondary | ICD-10-CM | POA: Diagnosis not present

## 2024-06-13 DIAGNOSIS — N186 End stage renal disease: Secondary | ICD-10-CM | POA: Diagnosis not present

## 2024-06-13 DIAGNOSIS — D509 Iron deficiency anemia, unspecified: Secondary | ICD-10-CM | POA: Diagnosis not present

## 2024-06-14 DIAGNOSIS — M25551 Pain in right hip: Secondary | ICD-10-CM | POA: Diagnosis not present

## 2024-06-14 DIAGNOSIS — M25651 Stiffness of right hip, not elsewhere classified: Secondary | ICD-10-CM | POA: Diagnosis not present

## 2024-06-15 DIAGNOSIS — D631 Anemia in chronic kidney disease: Secondary | ICD-10-CM | POA: Diagnosis not present

## 2024-06-15 DIAGNOSIS — D509 Iron deficiency anemia, unspecified: Secondary | ICD-10-CM | POA: Diagnosis not present

## 2024-06-15 DIAGNOSIS — E8779 Other fluid overload: Secondary | ICD-10-CM | POA: Diagnosis not present

## 2024-06-15 DIAGNOSIS — N186 End stage renal disease: Secondary | ICD-10-CM | POA: Diagnosis not present

## 2024-06-15 DIAGNOSIS — N2581 Secondary hyperparathyroidism of renal origin: Secondary | ICD-10-CM | POA: Diagnosis not present

## 2024-06-16 ENCOUNTER — Other Ambulatory Visit: Payer: Medicare HMO

## 2024-06-16 DIAGNOSIS — M25551 Pain in right hip: Secondary | ICD-10-CM | POA: Diagnosis not present

## 2024-06-16 DIAGNOSIS — M25651 Stiffness of right hip, not elsewhere classified: Secondary | ICD-10-CM | POA: Diagnosis not present

## 2024-06-17 DIAGNOSIS — D631 Anemia in chronic kidney disease: Secondary | ICD-10-CM | POA: Diagnosis not present

## 2024-06-17 DIAGNOSIS — N2581 Secondary hyperparathyroidism of renal origin: Secondary | ICD-10-CM | POA: Diagnosis not present

## 2024-06-17 DIAGNOSIS — D509 Iron deficiency anemia, unspecified: Secondary | ICD-10-CM | POA: Diagnosis not present

## 2024-06-17 DIAGNOSIS — N186 End stage renal disease: Secondary | ICD-10-CM | POA: Diagnosis not present

## 2024-06-17 DIAGNOSIS — E8779 Other fluid overload: Secondary | ICD-10-CM | POA: Diagnosis not present

## 2024-06-19 DIAGNOSIS — H5211 Myopia, right eye: Secondary | ICD-10-CM | POA: Diagnosis not present

## 2024-06-19 DIAGNOSIS — H524 Presbyopia: Secondary | ICD-10-CM | POA: Diagnosis not present

## 2024-06-19 DIAGNOSIS — Z992 Dependence on renal dialysis: Secondary | ICD-10-CM | POA: Diagnosis not present

## 2024-06-19 DIAGNOSIS — H52222 Regular astigmatism, left eye: Secondary | ICD-10-CM | POA: Diagnosis not present

## 2024-06-19 DIAGNOSIS — N186 End stage renal disease: Secondary | ICD-10-CM | POA: Diagnosis not present

## 2024-06-22 DIAGNOSIS — D509 Iron deficiency anemia, unspecified: Secondary | ICD-10-CM | POA: Diagnosis not present

## 2024-06-22 DIAGNOSIS — N186 End stage renal disease: Secondary | ICD-10-CM | POA: Diagnosis not present

## 2024-06-22 DIAGNOSIS — D631 Anemia in chronic kidney disease: Secondary | ICD-10-CM | POA: Diagnosis not present

## 2024-06-22 DIAGNOSIS — E1122 Type 2 diabetes mellitus with diabetic chronic kidney disease: Secondary | ICD-10-CM | POA: Diagnosis not present

## 2024-06-22 DIAGNOSIS — N2581 Secondary hyperparathyroidism of renal origin: Secondary | ICD-10-CM | POA: Diagnosis not present

## 2024-06-24 DIAGNOSIS — D509 Iron deficiency anemia, unspecified: Secondary | ICD-10-CM | POA: Diagnosis not present

## 2024-06-24 DIAGNOSIS — N2581 Secondary hyperparathyroidism of renal origin: Secondary | ICD-10-CM | POA: Diagnosis not present

## 2024-06-24 DIAGNOSIS — N186 End stage renal disease: Secondary | ICD-10-CM | POA: Diagnosis not present

## 2024-06-24 DIAGNOSIS — D631 Anemia in chronic kidney disease: Secondary | ICD-10-CM | POA: Diagnosis not present

## 2024-06-27 DIAGNOSIS — D509 Iron deficiency anemia, unspecified: Secondary | ICD-10-CM | POA: Diagnosis not present

## 2024-06-27 DIAGNOSIS — D631 Anemia in chronic kidney disease: Secondary | ICD-10-CM | POA: Diagnosis not present

## 2024-06-27 DIAGNOSIS — N2581 Secondary hyperparathyroidism of renal origin: Secondary | ICD-10-CM | POA: Diagnosis not present

## 2024-06-27 DIAGNOSIS — N186 End stage renal disease: Secondary | ICD-10-CM | POA: Diagnosis not present

## 2024-06-28 ENCOUNTER — Other Ambulatory Visit (HOSPITAL_BASED_OUTPATIENT_CLINIC_OR_DEPARTMENT_OTHER)

## 2024-06-28 DIAGNOSIS — Z992 Dependence on renal dialysis: Secondary | ICD-10-CM | POA: Diagnosis not present

## 2024-06-28 DIAGNOSIS — N186 End stage renal disease: Secondary | ICD-10-CM | POA: Diagnosis not present

## 2024-06-28 DIAGNOSIS — R195 Other fecal abnormalities: Secondary | ICD-10-CM | POA: Diagnosis not present

## 2024-06-28 DIAGNOSIS — R1010 Upper abdominal pain, unspecified: Secondary | ICD-10-CM | POA: Diagnosis not present

## 2024-06-28 DIAGNOSIS — K5900 Constipation, unspecified: Secondary | ICD-10-CM | POA: Diagnosis not present

## 2024-06-29 DIAGNOSIS — D631 Anemia in chronic kidney disease: Secondary | ICD-10-CM | POA: Diagnosis not present

## 2024-06-29 DIAGNOSIS — D509 Iron deficiency anemia, unspecified: Secondary | ICD-10-CM | POA: Diagnosis not present

## 2024-06-29 DIAGNOSIS — N2581 Secondary hyperparathyroidism of renal origin: Secondary | ICD-10-CM | POA: Diagnosis not present

## 2024-06-29 DIAGNOSIS — R1010 Upper abdominal pain, unspecified: Secondary | ICD-10-CM | POA: Diagnosis not present

## 2024-06-29 DIAGNOSIS — K5900 Constipation, unspecified: Secondary | ICD-10-CM | POA: Diagnosis not present

## 2024-06-29 DIAGNOSIS — N186 End stage renal disease: Secondary | ICD-10-CM | POA: Diagnosis not present

## 2024-06-30 DIAGNOSIS — M25651 Stiffness of right hip, not elsewhere classified: Secondary | ICD-10-CM | POA: Diagnosis not present

## 2024-06-30 DIAGNOSIS — M25551 Pain in right hip: Secondary | ICD-10-CM | POA: Diagnosis not present

## 2024-07-01 DIAGNOSIS — D631 Anemia in chronic kidney disease: Secondary | ICD-10-CM | POA: Diagnosis not present

## 2024-07-01 DIAGNOSIS — N2581 Secondary hyperparathyroidism of renal origin: Secondary | ICD-10-CM | POA: Diagnosis not present

## 2024-07-01 DIAGNOSIS — D509 Iron deficiency anemia, unspecified: Secondary | ICD-10-CM | POA: Diagnosis not present

## 2024-07-01 DIAGNOSIS — N186 End stage renal disease: Secondary | ICD-10-CM | POA: Diagnosis not present

## 2024-07-04 DIAGNOSIS — D631 Anemia in chronic kidney disease: Secondary | ICD-10-CM | POA: Diagnosis not present

## 2024-07-04 DIAGNOSIS — N2581 Secondary hyperparathyroidism of renal origin: Secondary | ICD-10-CM | POA: Diagnosis not present

## 2024-07-04 DIAGNOSIS — D509 Iron deficiency anemia, unspecified: Secondary | ICD-10-CM | POA: Diagnosis not present

## 2024-07-04 DIAGNOSIS — N186 End stage renal disease: Secondary | ICD-10-CM | POA: Diagnosis not present

## 2024-07-05 DIAGNOSIS — M25551 Pain in right hip: Secondary | ICD-10-CM | POA: Diagnosis not present

## 2024-07-05 DIAGNOSIS — M25651 Stiffness of right hip, not elsewhere classified: Secondary | ICD-10-CM | POA: Diagnosis not present

## 2024-07-06 DIAGNOSIS — D509 Iron deficiency anemia, unspecified: Secondary | ICD-10-CM | POA: Diagnosis not present

## 2024-07-06 DIAGNOSIS — N2581 Secondary hyperparathyroidism of renal origin: Secondary | ICD-10-CM | POA: Diagnosis not present

## 2024-07-06 DIAGNOSIS — N186 End stage renal disease: Secondary | ICD-10-CM | POA: Diagnosis not present

## 2024-07-06 DIAGNOSIS — D631 Anemia in chronic kidney disease: Secondary | ICD-10-CM | POA: Diagnosis not present

## 2024-07-07 DIAGNOSIS — M25551 Pain in right hip: Secondary | ICD-10-CM | POA: Diagnosis not present

## 2024-07-07 DIAGNOSIS — M25651 Stiffness of right hip, not elsewhere classified: Secondary | ICD-10-CM | POA: Diagnosis not present

## 2024-07-08 DIAGNOSIS — N186 End stage renal disease: Secondary | ICD-10-CM | POA: Diagnosis not present

## 2024-07-08 DIAGNOSIS — D509 Iron deficiency anemia, unspecified: Secondary | ICD-10-CM | POA: Diagnosis not present

## 2024-07-08 DIAGNOSIS — N2581 Secondary hyperparathyroidism of renal origin: Secondary | ICD-10-CM | POA: Diagnosis not present

## 2024-07-08 DIAGNOSIS — D631 Anemia in chronic kidney disease: Secondary | ICD-10-CM | POA: Diagnosis not present

## 2024-07-10 DIAGNOSIS — M25651 Stiffness of right hip, not elsewhere classified: Secondary | ICD-10-CM | POA: Diagnosis not present

## 2024-07-10 DIAGNOSIS — M25551 Pain in right hip: Secondary | ICD-10-CM | POA: Diagnosis not present

## 2024-07-11 DIAGNOSIS — D631 Anemia in chronic kidney disease: Secondary | ICD-10-CM | POA: Diagnosis not present

## 2024-07-11 DIAGNOSIS — D509 Iron deficiency anemia, unspecified: Secondary | ICD-10-CM | POA: Diagnosis not present

## 2024-07-11 DIAGNOSIS — N186 End stage renal disease: Secondary | ICD-10-CM | POA: Diagnosis not present

## 2024-07-11 DIAGNOSIS — N2581 Secondary hyperparathyroidism of renal origin: Secondary | ICD-10-CM | POA: Diagnosis not present

## 2024-07-13 DIAGNOSIS — N2581 Secondary hyperparathyroidism of renal origin: Secondary | ICD-10-CM | POA: Diagnosis not present

## 2024-07-13 DIAGNOSIS — D509 Iron deficiency anemia, unspecified: Secondary | ICD-10-CM | POA: Diagnosis not present

## 2024-07-13 DIAGNOSIS — D631 Anemia in chronic kidney disease: Secondary | ICD-10-CM | POA: Diagnosis not present

## 2024-07-13 DIAGNOSIS — N186 End stage renal disease: Secondary | ICD-10-CM | POA: Diagnosis not present

## 2024-07-14 DIAGNOSIS — M25651 Stiffness of right hip, not elsewhere classified: Secondary | ICD-10-CM | POA: Diagnosis not present

## 2024-07-14 DIAGNOSIS — M25551 Pain in right hip: Secondary | ICD-10-CM | POA: Diagnosis not present

## 2024-07-15 DIAGNOSIS — D631 Anemia in chronic kidney disease: Secondary | ICD-10-CM | POA: Diagnosis not present

## 2024-07-15 DIAGNOSIS — N186 End stage renal disease: Secondary | ICD-10-CM | POA: Diagnosis not present

## 2024-07-15 DIAGNOSIS — N2581 Secondary hyperparathyroidism of renal origin: Secondary | ICD-10-CM | POA: Diagnosis not present

## 2024-07-15 DIAGNOSIS — D509 Iron deficiency anemia, unspecified: Secondary | ICD-10-CM | POA: Diagnosis not present

## 2024-07-18 DIAGNOSIS — Z992 Dependence on renal dialysis: Secondary | ICD-10-CM | POA: Diagnosis not present

## 2024-07-18 DIAGNOSIS — N185 Chronic kidney disease, stage 5: Secondary | ICD-10-CM | POA: Diagnosis not present

## 2024-07-18 DIAGNOSIS — D631 Anemia in chronic kidney disease: Secondary | ICD-10-CM | POA: Diagnosis not present

## 2024-07-18 DIAGNOSIS — I10 Essential (primary) hypertension: Secondary | ICD-10-CM | POA: Diagnosis not present

## 2024-07-18 DIAGNOSIS — N2581 Secondary hyperparathyroidism of renal origin: Secondary | ICD-10-CM | POA: Diagnosis not present

## 2024-07-18 DIAGNOSIS — E1122 Type 2 diabetes mellitus with diabetic chronic kidney disease: Secondary | ICD-10-CM | POA: Diagnosis not present

## 2024-07-18 DIAGNOSIS — Z6841 Body Mass Index (BMI) 40.0 and over, adult: Secondary | ICD-10-CM | POA: Diagnosis not present

## 2024-07-18 DIAGNOSIS — D509 Iron deficiency anemia, unspecified: Secondary | ICD-10-CM | POA: Diagnosis not present

## 2024-07-18 DIAGNOSIS — N186 End stage renal disease: Secondary | ICD-10-CM | POA: Diagnosis not present

## 2024-07-20 DIAGNOSIS — Z992 Dependence on renal dialysis: Secondary | ICD-10-CM | POA: Diagnosis not present

## 2024-07-20 DIAGNOSIS — D509 Iron deficiency anemia, unspecified: Secondary | ICD-10-CM | POA: Diagnosis not present

## 2024-07-20 DIAGNOSIS — N186 End stage renal disease: Secondary | ICD-10-CM | POA: Diagnosis not present

## 2024-07-20 DIAGNOSIS — D631 Anemia in chronic kidney disease: Secondary | ICD-10-CM | POA: Diagnosis not present

## 2024-07-20 DIAGNOSIS — N2581 Secondary hyperparathyroidism of renal origin: Secondary | ICD-10-CM | POA: Diagnosis not present

## 2024-07-27 ENCOUNTER — Other Ambulatory Visit (HOSPITAL_COMMUNITY): Payer: Self-pay

## 2024-07-27 DIAGNOSIS — N189 Chronic kidney disease, unspecified: Secondary | ICD-10-CM

## 2024-07-27 DIAGNOSIS — Z7682 Awaiting organ transplant status: Secondary | ICD-10-CM

## 2024-08-11 ENCOUNTER — Ambulatory Visit (HOSPITAL_COMMUNITY)
Admission: RE | Admit: 2024-08-11 | Discharge: 2024-08-11 | Disposition: A | Discharge status: Home / Self Care | Source: Ambulatory Visit

## 2024-08-11 DIAGNOSIS — Z7682 Awaiting organ transplant status: Secondary | ICD-10-CM | POA: Diagnosis present

## 2024-08-11 DIAGNOSIS — N189 Chronic kidney disease, unspecified: Secondary | ICD-10-CM | POA: Insufficient documentation

## 2024-09-20 DIAGNOSIS — N186 End stage renal disease: Secondary | ICD-10-CM | POA: Diagnosis not present

## 2024-09-20 DIAGNOSIS — Z992 Dependence on renal dialysis: Secondary | ICD-10-CM | POA: Diagnosis not present

## 2024-09-21 DIAGNOSIS — E1122 Type 2 diabetes mellitus with diabetic chronic kidney disease: Secondary | ICD-10-CM | POA: Diagnosis not present

## 2024-09-21 DIAGNOSIS — N2581 Secondary hyperparathyroidism of renal origin: Secondary | ICD-10-CM | POA: Diagnosis not present

## 2024-09-21 DIAGNOSIS — D631 Anemia in chronic kidney disease: Secondary | ICD-10-CM | POA: Diagnosis not present

## 2024-09-21 DIAGNOSIS — D509 Iron deficiency anemia, unspecified: Secondary | ICD-10-CM | POA: Diagnosis not present

## 2024-09-21 DIAGNOSIS — Z23 Encounter for immunization: Secondary | ICD-10-CM | POA: Diagnosis not present

## 2024-09-26 DIAGNOSIS — Z23 Encounter for immunization: Secondary | ICD-10-CM | POA: Diagnosis not present

## 2024-09-26 DIAGNOSIS — N186 End stage renal disease: Secondary | ICD-10-CM | POA: Diagnosis not present

## 2024-09-26 DIAGNOSIS — D631 Anemia in chronic kidney disease: Secondary | ICD-10-CM | POA: Diagnosis not present

## 2024-09-26 DIAGNOSIS — N2581 Secondary hyperparathyroidism of renal origin: Secondary | ICD-10-CM | POA: Diagnosis not present

## 2024-09-27 DIAGNOSIS — M25651 Stiffness of right hip, not elsewhere classified: Secondary | ICD-10-CM | POA: Diagnosis not present

## 2024-09-27 DIAGNOSIS — M25551 Pain in right hip: Secondary | ICD-10-CM | POA: Diagnosis not present

## 2024-09-29 DIAGNOSIS — M25651 Stiffness of right hip, not elsewhere classified: Secondary | ICD-10-CM | POA: Diagnosis not present

## 2024-09-29 DIAGNOSIS — M25551 Pain in right hip: Secondary | ICD-10-CM | POA: Diagnosis not present

## 2024-10-03 DIAGNOSIS — D509 Iron deficiency anemia, unspecified: Secondary | ICD-10-CM | POA: Diagnosis not present

## 2024-10-03 DIAGNOSIS — N186 End stage renal disease: Secondary | ICD-10-CM | POA: Diagnosis not present

## 2024-10-03 DIAGNOSIS — Z23 Encounter for immunization: Secondary | ICD-10-CM | POA: Diagnosis not present

## 2024-10-03 DIAGNOSIS — D631 Anemia in chronic kidney disease: Secondary | ICD-10-CM | POA: Diagnosis not present

## 2024-10-03 DIAGNOSIS — N2581 Secondary hyperparathyroidism of renal origin: Secondary | ICD-10-CM | POA: Diagnosis not present

## 2024-10-04 DIAGNOSIS — M25651 Stiffness of right hip, not elsewhere classified: Secondary | ICD-10-CM | POA: Diagnosis not present

## 2024-10-04 DIAGNOSIS — M25551 Pain in right hip: Secondary | ICD-10-CM | POA: Diagnosis not present

## 2024-10-05 DIAGNOSIS — N186 End stage renal disease: Secondary | ICD-10-CM | POA: Diagnosis not present

## 2024-10-05 DIAGNOSIS — D631 Anemia in chronic kidney disease: Secondary | ICD-10-CM | POA: Diagnosis not present

## 2024-10-05 DIAGNOSIS — Z23 Encounter for immunization: Secondary | ICD-10-CM | POA: Diagnosis not present

## 2024-10-05 DIAGNOSIS — D509 Iron deficiency anemia, unspecified: Secondary | ICD-10-CM | POA: Diagnosis not present

## 2024-10-05 DIAGNOSIS — N2581 Secondary hyperparathyroidism of renal origin: Secondary | ICD-10-CM | POA: Diagnosis not present

## 2024-10-06 DIAGNOSIS — I120 Hypertensive chronic kidney disease with stage 5 chronic kidney disease or end stage renal disease: Secondary | ICD-10-CM | POA: Diagnosis not present

## 2024-10-06 DIAGNOSIS — E875 Hyperkalemia: Secondary | ICD-10-CM | POA: Diagnosis not present

## 2024-10-06 DIAGNOSIS — N186 End stage renal disease: Secondary | ICD-10-CM | POA: Diagnosis not present

## 2024-10-06 DIAGNOSIS — Z0000 Encounter for general adult medical examination without abnormal findings: Secondary | ICD-10-CM | POA: Diagnosis not present

## 2024-10-06 DIAGNOSIS — D649 Anemia, unspecified: Secondary | ICD-10-CM | POA: Diagnosis not present

## 2024-10-06 DIAGNOSIS — M25551 Pain in right hip: Secondary | ICD-10-CM | POA: Diagnosis not present

## 2024-10-06 DIAGNOSIS — Z1331 Encounter for screening for depression: Secondary | ICD-10-CM | POA: Diagnosis not present

## 2024-10-06 DIAGNOSIS — I5030 Unspecified diastolic (congestive) heart failure: Secondary | ICD-10-CM | POA: Diagnosis not present

## 2024-10-06 DIAGNOSIS — M25651 Stiffness of right hip, not elsewhere classified: Secondary | ICD-10-CM | POA: Diagnosis not present

## 2024-10-06 DIAGNOSIS — I10 Essential (primary) hypertension: Secondary | ICD-10-CM | POA: Diagnosis not present

## 2024-10-06 DIAGNOSIS — E782 Mixed hyperlipidemia: Secondary | ICD-10-CM | POA: Diagnosis not present

## 2024-10-06 DIAGNOSIS — N2581 Secondary hyperparathyroidism of renal origin: Secondary | ICD-10-CM | POA: Diagnosis not present

## 2024-10-06 DIAGNOSIS — Z13820 Encounter for screening for osteoporosis: Secondary | ICD-10-CM | POA: Diagnosis not present

## 2024-10-06 DIAGNOSIS — E1122 Type 2 diabetes mellitus with diabetic chronic kidney disease: Secondary | ICD-10-CM | POA: Diagnosis not present

## 2024-10-11 DIAGNOSIS — E1122 Type 2 diabetes mellitus with diabetic chronic kidney disease: Secondary | ICD-10-CM | POA: Diagnosis not present

## 2024-10-11 DIAGNOSIS — Z6841 Body Mass Index (BMI) 40.0 and over, adult: Secondary | ICD-10-CM | POA: Diagnosis not present

## 2024-10-11 DIAGNOSIS — I10 Essential (primary) hypertension: Secondary | ICD-10-CM | POA: Diagnosis not present

## 2024-10-11 DIAGNOSIS — N185 Chronic kidney disease, stage 5: Secondary | ICD-10-CM | POA: Diagnosis not present

## 2024-10-11 DIAGNOSIS — Z992 Dependence on renal dialysis: Secondary | ICD-10-CM | POA: Diagnosis not present

## 2024-10-12 DIAGNOSIS — N2581 Secondary hyperparathyroidism of renal origin: Secondary | ICD-10-CM | POA: Diagnosis not present

## 2024-10-12 DIAGNOSIS — Z23 Encounter for immunization: Secondary | ICD-10-CM | POA: Diagnosis not present

## 2024-10-12 DIAGNOSIS — D509 Iron deficiency anemia, unspecified: Secondary | ICD-10-CM | POA: Diagnosis not present

## 2024-10-12 DIAGNOSIS — D631 Anemia in chronic kidney disease: Secondary | ICD-10-CM | POA: Diagnosis not present

## 2024-10-13 DIAGNOSIS — M25651 Stiffness of right hip, not elsewhere classified: Secondary | ICD-10-CM | POA: Diagnosis not present

## 2024-10-13 DIAGNOSIS — M25551 Pain in right hip: Secondary | ICD-10-CM | POA: Diagnosis not present

## 2024-10-14 DIAGNOSIS — N186 End stage renal disease: Secondary | ICD-10-CM | POA: Diagnosis not present

## 2024-10-14 DIAGNOSIS — N2581 Secondary hyperparathyroidism of renal origin: Secondary | ICD-10-CM | POA: Diagnosis not present

## 2024-10-14 DIAGNOSIS — D509 Iron deficiency anemia, unspecified: Secondary | ICD-10-CM | POA: Diagnosis not present

## 2024-10-14 DIAGNOSIS — D631 Anemia in chronic kidney disease: Secondary | ICD-10-CM | POA: Diagnosis not present

## 2024-10-14 DIAGNOSIS — Z23 Encounter for immunization: Secondary | ICD-10-CM | POA: Diagnosis not present

## 2024-10-17 DIAGNOSIS — D509 Iron deficiency anemia, unspecified: Secondary | ICD-10-CM | POA: Diagnosis not present

## 2024-10-17 DIAGNOSIS — N186 End stage renal disease: Secondary | ICD-10-CM | POA: Diagnosis not present

## 2024-10-17 DIAGNOSIS — D631 Anemia in chronic kidney disease: Secondary | ICD-10-CM | POA: Diagnosis not present

## 2024-10-17 DIAGNOSIS — N2581 Secondary hyperparathyroidism of renal origin: Secondary | ICD-10-CM | POA: Diagnosis not present

## 2024-10-18 DIAGNOSIS — M25551 Pain in right hip: Secondary | ICD-10-CM | POA: Diagnosis not present

## 2024-10-18 DIAGNOSIS — M25651 Stiffness of right hip, not elsewhere classified: Secondary | ICD-10-CM | POA: Diagnosis not present

## 2024-10-20 DIAGNOSIS — M25551 Pain in right hip: Secondary | ICD-10-CM | POA: Diagnosis not present

## 2024-10-20 DIAGNOSIS — M25651 Stiffness of right hip, not elsewhere classified: Secondary | ICD-10-CM | POA: Diagnosis not present

## 2024-10-21 DIAGNOSIS — N2581 Secondary hyperparathyroidism of renal origin: Secondary | ICD-10-CM | POA: Diagnosis not present

## 2024-10-21 DIAGNOSIS — D509 Iron deficiency anemia, unspecified: Secondary | ICD-10-CM | POA: Diagnosis not present

## 2024-10-21 DIAGNOSIS — D631 Anemia in chronic kidney disease: Secondary | ICD-10-CM | POA: Diagnosis not present

## 2024-10-24 DIAGNOSIS — N2581 Secondary hyperparathyroidism of renal origin: Secondary | ICD-10-CM | POA: Diagnosis not present

## 2024-10-24 DIAGNOSIS — D509 Iron deficiency anemia, unspecified: Secondary | ICD-10-CM | POA: Diagnosis not present

## 2024-10-27 DIAGNOSIS — M25651 Stiffness of right hip, not elsewhere classified: Secondary | ICD-10-CM | POA: Diagnosis not present

## 2024-10-27 DIAGNOSIS — M25551 Pain in right hip: Secondary | ICD-10-CM | POA: Diagnosis not present

## 2024-10-31 DIAGNOSIS — N186 End stage renal disease: Secondary | ICD-10-CM | POA: Diagnosis not present

## 2024-10-31 DIAGNOSIS — D509 Iron deficiency anemia, unspecified: Secondary | ICD-10-CM | POA: Diagnosis not present

## 2024-11-01 DIAGNOSIS — M25551 Pain in right hip: Secondary | ICD-10-CM | POA: Diagnosis not present

## 2024-11-01 DIAGNOSIS — M25651 Stiffness of right hip, not elsewhere classified: Secondary | ICD-10-CM | POA: Diagnosis not present

## 2024-11-03 DIAGNOSIS — M25551 Pain in right hip: Secondary | ICD-10-CM | POA: Diagnosis not present

## 2024-11-03 DIAGNOSIS — M25651 Stiffness of right hip, not elsewhere classified: Secondary | ICD-10-CM | POA: Diagnosis not present

## 2024-11-08 DIAGNOSIS — R1010 Upper abdominal pain, unspecified: Secondary | ICD-10-CM | POA: Diagnosis not present

## 2024-11-08 DIAGNOSIS — K5900 Constipation, unspecified: Secondary | ICD-10-CM | POA: Diagnosis not present

## 2024-11-10 DIAGNOSIS — M25551 Pain in right hip: Secondary | ICD-10-CM | POA: Diagnosis not present

## 2024-11-10 DIAGNOSIS — M25651 Stiffness of right hip, not elsewhere classified: Secondary | ICD-10-CM | POA: Diagnosis not present

## 2024-11-11 DIAGNOSIS — D509 Iron deficiency anemia, unspecified: Secondary | ICD-10-CM | POA: Diagnosis not present

## 2024-11-11 DIAGNOSIS — N2581 Secondary hyperparathyroidism of renal origin: Secondary | ICD-10-CM | POA: Diagnosis not present

## 2024-11-11 DIAGNOSIS — N186 End stage renal disease: Secondary | ICD-10-CM | POA: Diagnosis not present

## 2024-11-11 DIAGNOSIS — D631 Anemia in chronic kidney disease: Secondary | ICD-10-CM | POA: Diagnosis not present

## 2024-11-13 DIAGNOSIS — D509 Iron deficiency anemia, unspecified: Secondary | ICD-10-CM | POA: Diagnosis not present

## 2024-11-13 DIAGNOSIS — D631 Anemia in chronic kidney disease: Secondary | ICD-10-CM | POA: Diagnosis not present

## 2024-11-13 DIAGNOSIS — N186 End stage renal disease: Secondary | ICD-10-CM | POA: Diagnosis not present

## 2024-11-13 DIAGNOSIS — N2581 Secondary hyperparathyroidism of renal origin: Secondary | ICD-10-CM | POA: Diagnosis not present

## 2024-11-18 DIAGNOSIS — I129 Hypertensive chronic kidney disease with stage 1 through stage 4 chronic kidney disease, or unspecified chronic kidney disease: Secondary | ICD-10-CM | POA: Diagnosis not present

## 2024-11-18 DIAGNOSIS — Z992 Dependence on renal dialysis: Secondary | ICD-10-CM | POA: Diagnosis not present

## 2025-05-09 ENCOUNTER — Other Ambulatory Visit (HOSPITAL_BASED_OUTPATIENT_CLINIC_OR_DEPARTMENT_OTHER)
